# Patient Record
Sex: Male | Born: 1960 | Race: White | Hispanic: No | Marital: Single | Smoking: Never smoker
Health system: Southern US, Community
[De-identification: ages and names within clinical notes are randomized; demographics above are authoritative.]

## PROBLEM LIST (undated history)

## (undated) DIAGNOSIS — R6250 Unspecified lack of expected normal physiological development in childhood: Secondary | ICD-10-CM

## (undated) DIAGNOSIS — K219 Gastro-esophageal reflux disease without esophagitis: Secondary | ICD-10-CM

## (undated) DIAGNOSIS — K2270 Barrett's esophagus without dysplasia: Secondary | ICD-10-CM

## (undated) DIAGNOSIS — I10 Essential (primary) hypertension: Secondary | ICD-10-CM

## (undated) DIAGNOSIS — E785 Hyperlipidemia, unspecified: Secondary | ICD-10-CM

## (undated) DIAGNOSIS — F419 Anxiety disorder, unspecified: Secondary | ICD-10-CM

## (undated) DIAGNOSIS — N200 Calculus of kidney: Secondary | ICD-10-CM

## (undated) DIAGNOSIS — F99 Mental disorder, not otherwise specified: Secondary | ICD-10-CM

## (undated) DIAGNOSIS — N183 Chronic kidney disease, stage 3 unspecified: Secondary | ICD-10-CM

## (undated) DIAGNOSIS — E1165 Type 2 diabetes mellitus with hyperglycemia: Secondary | ICD-10-CM

## (undated) DIAGNOSIS — IMO0002 Reserved for concepts with insufficient information to code with codable children: Secondary | ICD-10-CM

## (undated) DIAGNOSIS — I50810 Right heart failure, unspecified: Secondary | ICD-10-CM

## (undated) DIAGNOSIS — K8020 Calculus of gallbladder without cholecystitis without obstruction: Secondary | ICD-10-CM

## (undated) DIAGNOSIS — D649 Anemia, unspecified: Secondary | ICD-10-CM

## (undated) DIAGNOSIS — I272 Pulmonary hypertension, unspecified: Secondary | ICD-10-CM

## (undated) DIAGNOSIS — J9611 Chronic respiratory failure with hypoxia: Secondary | ICD-10-CM

## (undated) HISTORY — DX: Chronic kidney disease, stage 3 unspecified: N18.30

## (undated) HISTORY — DX: Chronic kidney disease, stage 3 (moderate): N18.3

## (undated) HISTORY — PX: THROAT SURGERY: SHX803

---
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
## 1998-03-31 ENCOUNTER — Ambulatory Visit (HOSPITAL_COMMUNITY): Admission: RE | Admit: 1998-03-31 | Discharge: 1998-03-31 | Payer: Self-pay | Admitting: Gastroenterology

## 1999-11-16 ENCOUNTER — Ambulatory Visit (HOSPITAL_COMMUNITY): Admission: RE | Admit: 1999-11-16 | Discharge: 1999-11-16 | Payer: Self-pay | Admitting: Gastroenterology

## 1999-11-16 ENCOUNTER — Encounter (INDEPENDENT_AMBULATORY_CARE_PROVIDER_SITE_OTHER): Payer: Self-pay | Admitting: Specialist

## 2002-02-19 ENCOUNTER — Ambulatory Visit (HOSPITAL_COMMUNITY): Admission: RE | Admit: 2002-02-19 | Discharge: 2002-02-19 | Payer: Self-pay | Admitting: Gastroenterology

## 2002-02-19 ENCOUNTER — Encounter (INDEPENDENT_AMBULATORY_CARE_PROVIDER_SITE_OTHER): Payer: Self-pay | Admitting: *Deleted

## 2004-04-10 ENCOUNTER — Ambulatory Visit (HOSPITAL_COMMUNITY): Admission: RE | Admit: 2004-04-10 | Discharge: 2004-04-10 | Payer: Self-pay | Admitting: Gastroenterology

## 2004-04-10 ENCOUNTER — Encounter (INDEPENDENT_AMBULATORY_CARE_PROVIDER_SITE_OTHER): Payer: Self-pay | Admitting: *Deleted

## 2007-02-02 ENCOUNTER — Encounter: Admission: RE | Admit: 2007-02-02 | Discharge: 2007-02-02 | Payer: Self-pay | Admitting: Occupational Therapy

## 2010-09-18 NOTE — Procedures (Signed)
[place]  Patient:    Tony Jones, Tony Jones                         MRN: [id number] Proc. Date: 11/16/99 Adm. Date:  [id number] Attending:  Nelda Marseille CC:         Petra Kuba, M.D.             Stanley C. Andrey Campanile, M.D.                           Procedure Report  PROCEDURE PERFORMED:  Esophagogastroduodenoscopy with biopsies.  ENDOSCOPIST:  Petra Kuba, M.D.  INDICATIONS FOR PROCEDURE:  Patient with a history of Barretts.  Some recent occasional increased upper tract symptoms.  Consent was signed after risks, benefits, methods, and options were thoroughly discussed multiple times in the past with his parents.  MEDICATIONS USED:  Demerol 40 mg, Versed 4 mg.  DESCRIPTION OF PROCEDURE:  Video endoscope was inserted by direct vision.  At about 22 cm his Barretts started.  I believe this is higher than previous endoscopies.  The most proximal part of the esophagus was normal.  The scope passed through the esophagus which was obviously had changes with Barretts but no significant ulceration, mass lesion, etc.  He did have a moderate-sized hiatal hernia widely patent.  No obvious ring or stricture.  Scope passed down into the stomach.  Some bilious material was suctioned.  Advanced through an antrum pertinent for some moderate antritis through a normal pylorus into a normal duodenal bulb and around the C-loop to a normal second portion of the duodenum.  The scope was withdrawn back to the bulb which again confirmed it was normal.  Scope was withdrawn back to the stomach which was evaluated on retroflex and then straight visualization.  High in the cardia, the hiatal hernia was confirmed.  Other than the antritis, no other abnormality was seen. The scope was straightened and slowly withdrawn through the esophagus which confirmed the above findings.  Multiple esophageal biopsies were obtained from about 38 cm to 30, were put in one container and from  about 30 cm to the top of the Barretts at about 22 were put in the second container.  The scope was then advanced to the antrum and multiple antral biopsies were obtained and put in the third container.  Air was suctioned, scope slowly withdrawn.  The patient tolerated the procedure well.  There was no obvious immediate complication.  ENDOSCOPIC DIAGNOSIS: 1. Moderate hiatal hernia. 2. 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 3. Antritis moderate, status post biopsy. 4. Otherwise normal esophagogastroduodenoscopy.  PLAN:  Await pathology to determine future Barretts screening.  Continue medicines and follow-up p.r.n. and will review the above with the parents. DD:  11/16/99 TD:  11/16/99 Job: [id number]

## 2010-09-18 NOTE — Op Note (Signed)
NAME:  Tony Jones, Tony Jones                            ACCOUNT NO.:  [id number]   MEDICAL RECORD NO.:  [id number]                   PATIENT TYPE:  AMB   LOCATION:  ENDO                                 FACILITY:  [facility]   PHYSICIAN:  Petra Kuba, M.D.                 DATE OF BIRTH:  Sep 07, 1960   DATE OF PROCEDURE:  02/19/2002  DATE OF DISCHARGE:                                 OPERATIVE REPORT   PROCEDURE PERFORMED:  Esophagogastroduodenoscopy with biopsy   INDICATIONS FOR PROCEDURE:  Barrett's due for repeat screening.  Consent was  signed after risks, benefits, methods and options were thoroughly discussed  with the parents on multiple occasions.   MEDICINES USED:  Demerol 40, Versed 4.   DESCRIPTION OF PROCEDURE:  Scope was inserted by direct vision.  The most  proximal of his esophagus was normal; however, beginning at about 23 cm down  to the large hiatal hernia was obvious Barrett's.  Photo documentation and  biopsies at the end of the procedure were obtained.  The scope passed into  the stomach and advanced through the antrum.  Pertinent for some significant  antritis and two small nodules, through a normal pylorus into a normal  duodenal bulb and around the duodenum to a normal second portion of the  duodenum.  The scope was withdrawn back to the bulb and a good look there  confirmed the above findings.  Scope was withdrawn back to the stomach.  He  had some difficulty holding air.  Some insufflation was obtained and no  obvious lesions of the cardia or fundus, lesser or greater curve were seen.  The inflammation was confined to the antrum.  Multiple antral biopsies of  both the nodules and the inflammation were obtained.  The scope was then  slowly withdrawn.  The Barrett's biopsies were obtained.  No other  abnormality in the esophagus were noted.  The scope was removed.  The  patient tolerated the procedure well.  There was no obvious immediate  complication.   ENDOSCOPIC  DIAGNOSES:  1. Significant Barrett's 2 to 23 cm, status post multiple biopsies.  2. Large hiatal hernia.  3. Significant antritis and small nodules, biopsied.  4. Otherwise within normal limits.    PLAN:  Await pathology.  Although he is asymptomatic may need to increase  his proton pump inhibitors to b.i.d.  Happy to see back p.r.n., otherwise  return care to Dr. Andrey Campanile for the customary health care maintenance.                                                  Petra Kuba, M.D.    MEM/MEDQ  D:  02/19/2002  T:  02/19/2002  Job:  [id number]  cc:   Vale Haven. Andrey Campanile, M.D.  [address]  [place]  [place] [address]  Fax: [phone]

## 2010-09-18 NOTE — Op Note (Signed)
NAME:  Tony, Jones NO.:  [id number]   MEDICAL RECORD NO.:  [id number]          PATIENT TYPE:  AMB   LOCATION:  ENDO                         FACILITY:  [facility]   PHYSICIAN:  Petra Kuba, M.D.    DATE OF BIRTH:  25-Nov-1960   DATE OF PROCEDURE:  04/10/2004  DATE OF DISCHARGE:                                 OPERATIVE REPORT   PROCEDURE:  EGD with biopsy.   INDICATION:  Barrett's, due for repeat screening.  Consent was signed after  risks, benefits, methods, options thoroughly discussed multiple times in the  past with his parents.   MEDICINES USED:  Demerol 40, Versed 3.   DESCRIPTION OF PROCEDURE:  The video endoscope was inserted by direct  vision.  Obvious Barrett's was seen almost including his entire esophagus.  Only the most proximal few centimeters were not involved.  He did have a  moderately large hiatal hernia.  The scope passed into the stomach, advanced  to the antrum where some mild to moderate antritis was seen and two small  antral polyps which were biopsied, put in the first container.  Biopsies of  the antrum were obtained as well.  The scope passed through a normal  pylorus, into the duodenal bulb pertinent for some minimal bulbitis, and  around the C-loop to a normal second portion of the duodenum.  The scope was  withdrawn back to the stomach, and retroflexed.  High in the cardia, the  hiatal hernia was confirmed.  The fundus, angularis, lesser and greater  curve were normal.  Straight visualization of the stomach did not reveal any  additional findings.  The scope was then slowly withdrawn.  The lower  esophageal biopsies from about 40-30 cm were put in the second container.  We went ahead and put the biopsies from about 30 to about 22 cm in a third  container.  No other abnormalities were seen.  The scope was reinserted into  the stomach; air was suctioned, the scope slowly withdrawn.  The patient  tolerated the procedure well.  Other than  a brief apneic spell prior to  beginning the procedure after sedation was begun, he had no obvious  immediate complications.   ENDOSCOPIC DIAGNOSES:  1.  Moderate hiatal hernia.  2.  Obvious Barrett's to 22 cm status post biopsy.  3.  Antritis and two antral small polyps biopsied.  4.  Minimal bulbitis.  5.  Otherwise, normal EGD.   PLAN:  1.  Await pathology.  2.  Continue pump inhibitors.  Possibly try and use Zegerid.  3.  Follow up with me p.r.n.  Otherwise, of note, we did not use the spray      on him today, and he did quite well, and maybe we should hold off on      that for future endoscopies since that always seems to bother him      postprocedure.      MEM/MEDQ  D:  04/10/2004  T:  04/10/2004  Job:  [id number]   cc:   Vale Haven. Andrey Campanile, M.D.  [phone]  [address]  [place]  [place] [address]  Fax: [phone]

## 2011-12-03 ENCOUNTER — Ambulatory Visit (HOSPITAL_COMMUNITY)
Admission: RE | Admit: 2011-12-03 | Discharge: 2011-12-03 | Disposition: A | Discharge status: Home / Self Care | Payer: Medicare Other | Source: Ambulatory Visit | Attending: Gastroenterology | Admitting: Gastroenterology

## 2011-12-03 ENCOUNTER — Encounter (HOSPITAL_COMMUNITY): Payer: Self-pay | Admitting: Anesthesiology

## 2011-12-03 ENCOUNTER — Encounter (HOSPITAL_COMMUNITY)
Admission: RE | Disposition: A | Discharge status: Home / Self Care | Payer: Self-pay | Source: Ambulatory Visit | Attending: Gastroenterology

## 2011-12-03 ENCOUNTER — Ambulatory Visit (HOSPITAL_COMMUNITY): Payer: Medicare Other | Admitting: Anesthesiology

## 2011-12-03 ENCOUNTER — Encounter (HOSPITAL_COMMUNITY): Payer: Self-pay

## 2011-12-03 DIAGNOSIS — K2970 Gastritis, unspecified, without bleeding: Secondary | ICD-10-CM | POA: Insufficient documentation

## 2011-12-03 DIAGNOSIS — K219 Gastro-esophageal reflux disease without esophagitis: Secondary | ICD-10-CM | POA: Insufficient documentation

## 2011-12-03 DIAGNOSIS — K449 Diaphragmatic hernia without obstruction or gangrene: Secondary | ICD-10-CM | POA: Insufficient documentation

## 2011-12-03 DIAGNOSIS — K2960 Other gastritis without bleeding: Secondary | ICD-10-CM | POA: Insufficient documentation

## 2011-12-03 DIAGNOSIS — Z79899 Other long term (current) drug therapy: Secondary | ICD-10-CM | POA: Insufficient documentation

## 2011-12-03 DIAGNOSIS — K319 Disease of stomach and duodenum, unspecified: Secondary | ICD-10-CM | POA: Insufficient documentation

## 2011-12-03 DIAGNOSIS — D131 Benign neoplasm of stomach: Secondary | ICD-10-CM | POA: Insufficient documentation

## 2011-12-03 HISTORY — DX: Anxiety disorder, unspecified: F41.9

## 2011-12-03 HISTORY — DX: Barrett's esophagus without dysplasia: K22.70

## 2011-12-03 HISTORY — DX: Mental disorder, not otherwise specified: F99

## 2011-12-03 HISTORY — DX: Gastro-esophageal reflux disease without esophagitis: K21.9

## 2011-12-03 SURGERY — ESOPHAGOGASTRODUODENOSCOPY (EGD) WITH PROPOFOL
Anesthesia: Monitor Anesthesia Care

## 2011-12-03 MED ORDER — LACTATED RINGERS IV SOLN
INTRAVENOUS | Status: DC
  Administered 2011-12-03: 1000 mL via INTRAVENOUS

## 2011-12-03 MED ORDER — FENTANYL CITRATE 0.05 MG/ML IJ SOLN
INTRAMUSCULAR | Status: DC | PRN
  Administered 2011-12-03: 50 ug via INTRAVENOUS

## 2011-12-03 MED ORDER — PROPOFOL 10 MG/ML IV EMUL
INTRAVENOUS | Status: DC | PRN
  Administered 2011-12-03: 70 ug/kg/min via INTRAVENOUS

## 2011-12-03 SURGICAL SUPPLY — 15 items

## 2011-12-03 NOTE — Transfer of Care (Signed)
Immediate Anesthesia Transfer of Care Note  Patient: Tony Jones  Procedure(s) Performed: Procedure(s) (LRB): ESOPHAGOGASTRODUODENOSCOPY (EGD) WITH PROPOFOL (N/A)  Patient Location: PACU and Endoscopy Unit  Anesthesia Type: MAC  Level of Consciousness: awake, sedated and patient cooperative  Airway & Oxygen Therapy: Patient Spontanous Breathing, Patient connected to face mask oxygen and nasal airway in place.  Post-op Assessment: Report given to PACU RN and Post -op Vital signs reviewed and stable  Post vital signs: Reviewed and stable  Complications: No apparent anesthesia complications

## 2011-12-03 NOTE — Anesthesia Preprocedure Evaluation (Addendum)
Anesthesia Evaluation  Patient identified by MRN, date of birth, ID band Patient awake    Reviewed: Allergy & Precautions, H&P , NPO status , Patient's Chart, lab work & pertinent test results  Airway Mallampati: IV TM Distance: >3 FB Neck ROM: Full  Mouth opening: Limited Mouth Opening  Dental  (+) Poor Dentition   Pulmonary neg pulmonary ROS,  breath sounds clear to auscultation  Pulmonary exam normal       Cardiovascular negative cardio ROS  Rhythm:Regular Rate:Normal     Neuro/Psych PSYCHIATRIC DISORDERS Anxiety Developmentally challenged. He does not speak much per mother.negative neurological ROS     GI/Hepatic Neg liver ROS, GERD-  Medicated,  Endo/Other  Morbid obesity  Renal/GU negative Renal ROS  negative genitourinary   Musculoskeletal negative musculoskeletal ROS (+)   Abdominal (+) + obese,   Peds negative pediatric ROS (+) mental retardation Hematology negative hematology ROS (+)   Anesthesia Other Findings   Reproductive/Obstetrics negative OB ROS                         Anesthesia Physical Anesthesia Plan  ASA: II  Anesthesia Plan: MAC   Post-op Pain Management:    Induction: Intravenous  Airway Management Planned:   Additional Equipment:   Intra-op Plan:   Post-operative Plan: Extubation in OR  Informed Consent: I have reviewed the patients History and Physical, chart, labs and discussed the procedure including the risks, benefits and alternatives for the proposed anesthesia with the patient or authorized representative who has indicated his/her understanding and acceptance.   Dental advisory given  Plan Discussed with: CRNA  Anesthesia Plan Comments: (High risk for sleep apnea. Per patient's mother , he has had procedures at [facility] and has required a nasal airway. We will expect to place a nasal airway after some sedation. Discussed possible nosebleed  with mother.)       Anesthesia Quick Evaluation

## 2011-12-03 NOTE — Op Note (Signed)
[facility] [address] [place], [place]  [address]  ENDOSCOPY PROCEDURE REPORT  PATIENT:  Tony, Jones  MR#:  [id number] BIRTHDATE:  09-21-1960, 51 yrs. old  GENDER:  male  ENDOSCOPIST:  Vida Rigger, MD Referred by:  Benedetto Goad, MD  PROCEDURE DATE:  12/03/2011 PROCEDURE:  EGD with biopsy, 19147 ASA CLASS:  Class III INDICATIONS:  history of Barrett's status post ablation due for repeat screening  MEDICATIONS:  50 mg Diprivan 50 mcg fentanyl TOPICAL ANESTHETIC: 1 spray used  DESCRIPTION OF PROCEDURE:   After the risks benefits and alternatives of the procedure were thoroughly explained, informed consent was obtained.  The Pentax Gastroscope [id number] endoscope was introduced through the mouth and advanced to the second portion of the duodenum, limited by sedation problems with some respiratory depression which required [place] to temporarily withdraw the scope and bag the patient and place a nasal airway and once his oxygenation returned to normal we were able to resume the procedure which was done in the customary fashion and multiple biopsies were obtained of his esophagus as well as his stomach and the findings are documented below The instrument was slowly withdrawn as the mucosa was fully examined. The patient tolerated the procedure well there was no additional complication <<PROCEDUREIMAGES>>  FINDINGS: 1. Small hiatal hernia 2. No obvious Barrett's was some scarring from previous therapy status post multiple esophageal biopsies of all areas 3. Gastritis and antritis status post biopsy 4. Small gastric polyps and 2 antral nodules status post biopsy 5. Mild bulbitis 6. Otherwise within normal limits EGD  COMPLICATIONS:  Respiratory depression as above which required bagging and nasal airway  ENDOSCOPIC IMPRESSION:  Above  RECOMMENDATIONS: Await pathology and will discuss with his [facility] duct based on pathology when repeat procedure should be done and happy to  see back when necessary  REPEAT EXAM:  As above  ______________________________ Vida Rigger, MD  CC:  Benedetto Goad, MD  n. Rosalie DoctorVida Rigger at 12/03/2011 11:47 AM  Candie Echevaria, [phone]

## 2011-12-03 NOTE — Anesthesia Postprocedure Evaluation (Signed)
  Anesthesia Post-op Note  Patient: Tony Jones  Procedure(s) Performed: Procedure(s) (LRB): ESOPHAGOGASTRODUODENOSCOPY (EGD) WITH PROPOFOL (N/A)  Patient Location: PACU  Anesthesia Type: MAC  Level of Consciousness: awake and alert   Airway and Oxygen Therapy: Patient Spontanous Breathing  Post-op Pain: mild  Post-op Assessment: Post-op Vital signs reviewed, Patient's Cardiovascular Status Stable, Respiratory Function Stable, Patent Airway and No signs of Nausea or vomiting  Post-op Vital Signs: stable  Complications: No apparent anesthesia complications

## 2011-12-03 NOTE — Progress Notes (Signed)
Tony Jones 9:49 AM  Subjective: Patient without any specific GI complaints and his history was discussed with his parents and no current issues  Objective: Vital signs stable afebrile no acute distress exam proceed he presedation evaluation  Assessment: History of Barrett's esophagus due for repeat screening  Plan: Okay for sedation and endoscopy  Kariann Wecker E

## 2013-09-26 ENCOUNTER — Inpatient Hospital Stay (HOSPITAL_COMMUNITY)
Admission: EM | Admit: 2013-09-26 | Discharge: 2013-09-29 | DRG: 291 | Disposition: A | Discharge status: Home Health | Payer: Medicare Other | Attending: Internal Medicine | Admitting: Internal Medicine

## 2013-09-26 ENCOUNTER — Encounter (HOSPITAL_COMMUNITY): Payer: Self-pay | Admitting: Emergency Medicine

## 2013-09-26 ENCOUNTER — Emergency Department (HOSPITAL_COMMUNITY): Payer: Medicare Other

## 2013-09-26 DIAGNOSIS — J9621 Acute and chronic respiratory failure with hypoxia: Secondary | ICD-10-CM | POA: Diagnosis present

## 2013-09-26 DIAGNOSIS — I5031 Acute diastolic (congestive) heart failure: Principal | ICD-10-CM | POA: Diagnosis present

## 2013-09-26 DIAGNOSIS — F99 Mental disorder, not otherwise specified: Secondary | ICD-10-CM | POA: Diagnosis present

## 2013-09-26 DIAGNOSIS — Z87442 Personal history of urinary calculi: Secondary | ICD-10-CM

## 2013-09-26 DIAGNOSIS — E873 Alkalosis: Secondary | ICD-10-CM | POA: Diagnosis present

## 2013-09-26 DIAGNOSIS — Z66 Do not resuscitate: Secondary | ICD-10-CM | POA: Diagnosis present

## 2013-09-26 DIAGNOSIS — Z79899 Other long term (current) drug therapy: Secondary | ICD-10-CM

## 2013-09-26 DIAGNOSIS — I517 Cardiomegaly: Secondary | ICD-10-CM

## 2013-09-26 DIAGNOSIS — I509 Heart failure, unspecified: Secondary | ICD-10-CM | POA: Diagnosis present

## 2013-09-26 DIAGNOSIS — J9600 Acute respiratory failure, unspecified whether with hypoxia or hypercapnia: Secondary | ICD-10-CM | POA: Diagnosis present

## 2013-09-26 DIAGNOSIS — IMO0001 Reserved for inherently not codable concepts without codable children: Secondary | ICD-10-CM | POA: Diagnosis present

## 2013-09-26 DIAGNOSIS — E1165 Type 2 diabetes mellitus with hyperglycemia: Secondary | ICD-10-CM | POA: Diagnosis present

## 2013-09-26 DIAGNOSIS — J9601 Acute respiratory failure with hypoxia: Secondary | ICD-10-CM

## 2013-09-26 DIAGNOSIS — E785 Hyperlipidemia, unspecified: Secondary | ICD-10-CM | POA: Diagnosis present

## 2013-09-26 DIAGNOSIS — I10 Essential (primary) hypertension: Secondary | ICD-10-CM | POA: Diagnosis present

## 2013-09-26 DIAGNOSIS — IMO0002 Reserved for concepts with insufficient information to code with codable children: Secondary | ICD-10-CM | POA: Diagnosis present

## 2013-09-26 DIAGNOSIS — Z6841 Body Mass Index (BMI) 40.0 and over, adult: Secondary | ICD-10-CM

## 2013-09-26 DIAGNOSIS — F79 Unspecified intellectual disabilities: Secondary | ICD-10-CM | POA: Diagnosis present

## 2013-09-26 DIAGNOSIS — F411 Generalized anxiety disorder: Secondary | ICD-10-CM | POA: Diagnosis present

## 2013-09-26 DIAGNOSIS — K219 Gastro-esophageal reflux disease without esophagitis: Secondary | ICD-10-CM | POA: Diagnosis present

## 2013-09-26 DIAGNOSIS — E669 Obesity, unspecified: Secondary | ICD-10-CM | POA: Diagnosis present

## 2013-09-26 DIAGNOSIS — D696 Thrombocytopenia, unspecified: Secondary | ICD-10-CM | POA: Diagnosis present

## 2013-09-26 DIAGNOSIS — Z9089 Acquired absence of other organs: Secondary | ICD-10-CM

## 2013-09-26 HISTORY — DX: Reserved for concepts with insufficient information to code with codable children: IMO0002

## 2013-09-26 HISTORY — DX: Essential (primary) hypertension: I10

## 2013-09-26 HISTORY — DX: Hyperlipidemia, unspecified: E78.5

## 2013-09-26 HISTORY — DX: Type 2 diabetes mellitus with hyperglycemia: E11.65

## 2013-09-26 LAB — GLUCOSE, CAPILLARY
Glucose-Capillary: 182 mg/dL — ABNORMAL HIGH (ref 70–99)
Glucose-Capillary: 188 mg/dL — ABNORMAL HIGH (ref 70–99)

## 2013-09-26 LAB — CBG MONITORING, ED: Glucose-Capillary: 205 mg/dL — ABNORMAL HIGH (ref 70–99)

## 2013-09-26 LAB — URINALYSIS, ROUTINE W REFLEX MICROSCOPIC
Bilirubin Urine: NEGATIVE
Glucose, UA: NEGATIVE mg/dL
Hgb urine dipstick: NEGATIVE
Ketones, ur: NEGATIVE mg/dL
Leukocytes, UA: NEGATIVE
Nitrite: NEGATIVE
Protein, ur: NEGATIVE mg/dL
Specific Gravity, Urine: 1.009 (ref 1.005–1.030)
Urobilinogen, UA: 1 mg/dL (ref 0.0–1.0)
pH: 5.5 (ref 5.0–8.0)

## 2013-09-26 LAB — CBC WITH DIFFERENTIAL/PLATELET
Basophils Absolute: 0 10*3/uL (ref 0.0–0.1)
Basophils Relative: 0 % (ref 0–1)
Eosinophils Absolute: 0.2 10*3/uL (ref 0.0–0.7)
Eosinophils Relative: 2 % (ref 0–5)
HCT: 50.7 % (ref 39.0–52.0)
Hemoglobin: 15.8 g/dL (ref 13.0–17.0)
Lymphocytes Relative: 8 % — ABNORMAL LOW (ref 12–46)
Lymphs Abs: 0.8 10*3/uL (ref 0.7–4.0)
MCH: 27.7 pg (ref 26.0–34.0)
MCHC: 31.2 g/dL (ref 30.0–36.0)
MCV: 88.8 fL (ref 78.0–100.0)
Monocytes Absolute: 0.5 10*3/uL (ref 0.1–1.0)
Monocytes Relative: 5 % (ref 3–12)
Neutro Abs: 9 10*3/uL — ABNORMAL HIGH (ref 1.7–7.7)
Neutrophils Relative %: 85 % — ABNORMAL HIGH (ref 43–77)
Platelets: 124 10*3/uL — ABNORMAL LOW (ref 150–400)
RBC: 5.71 MIL/uL (ref 4.22–5.81)
RDW: 18.3 % — ABNORMAL HIGH (ref 11.5–15.5)
WBC: 10.6 10*3/uL — ABNORMAL HIGH (ref 4.0–10.5)

## 2013-09-26 LAB — BASIC METABOLIC PANEL
BUN: 25 mg/dL — ABNORMAL HIGH (ref 6–23)
CO2: 33 mEq/L — ABNORMAL HIGH (ref 19–32)
Calcium: 8.4 mg/dL (ref 8.4–10.5)
Chloride: 92 mEq/L — ABNORMAL LOW (ref 96–112)
Creatinine, Ser: 1.07 mg/dL (ref 0.50–1.35)
GFR calc Af Amer: >90 mL/min (ref >90)
GFR calc non Af Amer: 78 mL/min — ABNORMAL LOW (ref >90)
Glucose, Bld: 191 mg/dL — ABNORMAL HIGH (ref 70–99)
Potassium: 4.5 mEq/L (ref 3.7–5.3)
Sodium: 138 mEq/L (ref 137–147)

## 2013-09-26 LAB — PRO B NATRIURETIC PEPTIDE: Pro B Natriuretic peptide (BNP): 1777 pg/mL — ABNORMAL HIGH (ref 0–125)

## 2013-09-26 LAB — HEMOGLOBIN A1C
HEMOGLOBIN A1C: 8.4 % — AB (ref <5.7)
Mean Plasma Glucose: 194 mg/dL — ABNORMAL HIGH (ref <117)

## 2013-09-26 LAB — TSH: TSH: 2.68 u[IU]/mL (ref 0.350–4.500)

## 2013-09-26 LAB — TROPONIN I: Troponin I: <0.3 ng/mL (ref <0.30)

## 2013-09-26 MED ORDER — ALBUTEROL SULFATE (2.5 MG/3ML) 0.083% IN NEBU: 2.5000 mg | INHALATION_SOLUTION | RESPIRATORY_TRACT | Status: DC | PRN

## 2013-09-26 MED ORDER — ACETAMINOPHEN 500 MG PO TABS
500.0000 mg | ORAL_TABLET | Freq: Four times a day (QID) | ORAL | Status: DC | PRN
Start: 2013-09-26 — End: 2013-09-29
  Administered 2013-09-28: 500 mg via ORAL
  Filled 2013-09-26: qty 1

## 2013-09-26 MED ORDER — SERTRALINE HCL 50 MG PO TABS
50.0000 mg | ORAL_TABLET | Freq: Every day | ORAL | Status: DC
  Administered 2013-09-27 – 2013-09-29 (×3): 50 mg via ORAL
  Filled 2013-09-26 (×3): qty 1

## 2013-09-26 MED ORDER — PANTOPRAZOLE SODIUM 40 MG PO TBEC
40.0000 mg | DELAYED_RELEASE_TABLET | Freq: Two times a day (BID) | ORAL | Status: DC
  Administered 2013-09-26 – 2013-09-29 (×6): 40 mg via ORAL
  Filled 2013-09-26 (×7): qty 1

## 2013-09-26 MED ORDER — ATORVASTATIN CALCIUM 10 MG PO TABS
10.0000 mg | ORAL_TABLET | Freq: Every day | ORAL | Status: DC
  Administered 2013-09-27 – 2013-09-29 (×3): 10 mg via ORAL
  Filled 2013-09-26 (×3): qty 1

## 2013-09-26 MED ORDER — TRIAMCINOLONE ACETONIDE 0.1 % EX CREA: 1.0000 "application " | TOPICAL_CREAM | Freq: Every day | CUTANEOUS | Status: DC | PRN

## 2013-09-26 MED ORDER — ONDANSETRON HCL 4 MG PO TABS: 4.0000 mg | ORAL_TABLET | Freq: Four times a day (QID) | ORAL | Status: DC | PRN

## 2013-09-26 MED ORDER — INSULIN ASPART 100 UNIT/ML ~~LOC~~ SOLN
0.0000 [IU] | Freq: Three times a day (TID) | SUBCUTANEOUS | Status: DC
  Administered 2013-09-26 – 2013-09-27 (×3): 2 [IU] via SUBCUTANEOUS
  Administered 2013-09-27: 3 [IU] via SUBCUTANEOUS
  Administered 2013-09-28: 1 [IU] via SUBCUTANEOUS
  Administered 2013-09-28: 3 [IU] via SUBCUTANEOUS
  Administered 2013-09-28 – 2013-09-29 (×2): 1 [IU] via SUBCUTANEOUS
  Administered 2013-09-29: 3 [IU] via SUBCUTANEOUS

## 2013-09-26 MED ORDER — ATENOLOL 50 MG PO TABS
50.0000 mg | ORAL_TABLET | Freq: Every day | ORAL | Status: DC
  Administered 2013-09-27 – 2013-09-29 (×3): 50 mg via ORAL
  Filled 2013-09-26 (×3): qty 1

## 2013-09-26 MED ORDER — SODIUM CHLORIDE 0.9 % IJ SOLN
3.0000 mL | Freq: Two times a day (BID) | INTRAMUSCULAR | Status: DC
  Administered 2013-09-27 – 2013-09-28 (×4): 3 mL via INTRAVENOUS

## 2013-09-26 MED ORDER — ONDANSETRON HCL 4 MG/2ML IJ SOLN: 4.0000 mg | Freq: Four times a day (QID) | INTRAMUSCULAR | Status: DC | PRN

## 2013-09-26 MED ORDER — FUROSEMIDE 10 MG/ML IJ SOLN
40.0000 mg | Freq: Two times a day (BID) | INTRAMUSCULAR | Status: DC
  Administered 2013-09-27 (×2): 40 mg via INTRAVENOUS
  Filled 2013-09-26 (×5): qty 4

## 2013-09-26 MED ORDER — ENOXAPARIN SODIUM 40 MG/0.4ML ~~LOC~~ SOLN
40.0000 mg | SUBCUTANEOUS | Status: DC
  Administered 2013-09-26 – 2013-09-28 (×3): 40 mg via SUBCUTANEOUS
  Filled 2013-09-26 (×4): qty 0.4

## 2013-09-26 MED ORDER — FUROSEMIDE 10 MG/ML IJ SOLN
60.0000 mg | Freq: Once | INTRAMUSCULAR | Status: AC
  Administered 2013-09-26: 60 mg via INTRAVENOUS
  Filled 2013-09-26: qty 8

## 2013-09-26 MED ORDER — BIOTENE DRY MOUTH MT LIQD
15.0000 mL | Freq: Two times a day (BID) | OROMUCOSAL | Status: DC
  Administered 2013-09-26 – 2013-09-29 (×6): 15 mL via OROMUCOSAL

## 2013-09-26 MED ORDER — FESOTERODINE FUMARATE ER 8 MG PO TB24
8.0000 mg | ORAL_TABLET | Freq: Every day | ORAL | Status: DC
  Administered 2013-09-27 – 2013-09-29 (×3): 8 mg via ORAL
  Filled 2013-09-26 (×4): qty 1

## 2013-09-26 MED ORDER — FUROSEMIDE 10 MG/ML IJ SOLN: 40.0000 mg | Freq: Two times a day (BID) | INTRAMUSCULAR | Status: DC

## 2013-09-26 MED ORDER — POTASSIUM CHLORIDE CRYS ER 20 MEQ PO TBCR
40.0000 meq | EXTENDED_RELEASE_TABLET | Freq: Once | ORAL | Status: AC
  Administered 2013-09-26: 40 meq via ORAL
  Filled 2013-09-26: qty 2

## 2013-09-26 NOTE — Progress Notes (Signed)
Patient's sats on 3.5L of oxygen was 99%, I bumped patient down to 2L's and sats remained 95-96% for most of the afternoon. Before shift change I removed oxygen for 5 minutes and rechecked patient- 72% on room air. Replaced patient back on 2L and sats came up to 94%.  Will continue to monitor.

## 2013-09-26 NOTE — Progress Notes (Signed)
*  PRELIMINARY RESULTS* Echocardiogram 2D Echocardiogram has been performed.  Tony Jones 09/26/2013, 2:34 PM

## 2013-09-26 NOTE — H&P (Addendum)
History and Physical  Tony Jones OXB:[id number] DOB: Sep 06, 1960 DOA: 09/26/2013  Referring physician: EDP PCP: Woody Seller, MD  Outpatient Specialists:  1. Urology: Dr. Risa Grill  Chief Complaint: Worsening dyspnea.  HPI: Tony Jones is a 53 y.o. male with history of mental retardation, type II DM, HTN, dyslipidemia, GERD, kidney stones, presented to the ED on 09/26/13 with complaints of worsening dyspnea. Patient unable to provide history secondary to cognitive impairment. History obtained from parents at bedside. Patient apparently has been having issues with orthopnea for the last 4-5 months. He can lie down on the bed for a couple of hours in the daytime but is unable to sleep in bed at night and has been sleeping on a recliner. He occasionally snores but no reported apneic spells and does not carry OSA diagnosis. Parents have also noticed progressively worsening leg edema, edema of upper extremities, puffiness of face and abdominal distention. His symptoms got worse 4 days ago when he was seen by his PCP for incidental left plantar wart. He had worsening dyspnea with minimal exertion and at rest. He was started on Lasix 20 mg daily and his urine output improved however his dyspnea and edema did not change much. Today, they checked his oxygen saturation at home which apparently was 54% and improved to 90% after he was placed on his father's oxygen. They called PCPs office and was advised to come to the ED where patient was noted to be edematous, chest x-ray suggestive of pulmonary edema, proBNP elevated and patient was oxygenating at 80% on room air. He was provided with a dose of IV Lasix 60 mg with good diuresis and improvement patient states that his dyspnea is better. No reported chest pain, fever, cough or palpitations.  Review of Systems: All systems reviewed and apart from history of presenting illness, are negative.  Past Medical History  Diagnosis Date  . Mental disorder   .  Anxiety   . GERD (gastroesophageal reflux disease)   . Barrett esophagus   . Diabetes mellitus type 2, uncontrolled   . Hypertension   . Dyslipidemia    Past Surgical History  Procedure Laterality Date  . Appendectomy     Social History:  reports that he has never smoked. He has never used smokeless tobacco. He reports that he does not drink alcohol or use illicit drugs. Single. Lives with parents and is dependent on activities of daily living.  No Known Allergies  History reviewed. No pertinent family history. strong family history of cardiac disease on father's side. Dad has history of CABG. Also on home oxygen.  Prior to Admission medications   Medication Sig Start Date End Date Taking? Authorizing Provider  acetaminophen (TYLENOL) 500 MG tablet Take 500 mg by mouth every 6 (six) hours as needed for mild pain.   Yes Historical Provider, MD  atenolol (TENORMIN) 50 MG tablet Take 50 mg by mouth daily.   Yes Historical Provider, MD  atorvastatin (LIPITOR) 10 MG tablet Take 10 mg by mouth daily.   Yes Historical Provider, MD  fesoterodine (TOVIAZ) 8 MG TB24 tablet Take 8 mg by mouth daily.   Yes Historical Provider, MD  furosemide (LASIX) 20 MG tablet Take 20 mg by mouth daily.   Yes Historical Provider, MD  metFORMIN (GLUCOPHAGE) 500 MG tablet Take 500 mg by mouth 2 (two) times daily with a meal.   Yes Historical Provider, MD  pantoprazole (PROTONIX) 40 MG tablet Take 40 mg by mouth 2 (two) times  daily.   Yes Historical Provider, MD  sertraline (ZOLOFT) 50 MG tablet Take 50 mg by mouth daily.   Yes Historical Provider, MD  triamcinolone cream (KENALOG) 0.1 % Apply 1 application topically daily as needed (rash to back, face, neck, back of knees.).   Yes Historical Provider, MD   Physical Exam: Filed Vitals:   09/26/13 1245 09/26/13 1251 09/26/13 1254 09/26/13 1329  BP:    126/80  Pulse: 107 109  108  Temp:    98.1 F (36.7 C)  TempSrc:    Oral  Resp:   18 18  Height:    5\' 3"   (1.6 m)  Weight:    107.004 kg (235 lb 14.4 oz)  SpO2: 96% 97%  95%   temperature 98.65F, blood pressure 126/80 mmHg.   General exam: Moderately built and obese male patient, since sitting up comfortably on chair in the ED.  Head, eyes and ENT: Nontraumatic and normocephalic. Pupils equally reacting to light and accommodation. Oral mucosa moist.  Neck: Supple. No JVD, carotid bruit or thyromegaly. Thick neck.  Lymphatics: No lymphadenopathy.  Respiratory system: Reduced breath sounds in the bases with few basal crackles. No increased work of breathing.  Cardiovascular system: S1 and S2 heard, RRR. Difficulty appreciating JVD secondary to thick neck. No murmurs, gallops or clicks. 2+ pitting bilateral leg edema extending to sacrum posteriorly.  Gastrointestinal system: Abdomen is obese/nondistended, soft and nontender. Normal bowel sounds heard. No organomegaly or masses appreciated.  Central nervous system: Alert and oriented x2. No focal neurological deficits.  Extremities: Symmetric 5 x 5 power. Peripheral pulses symmetrically felt.   Skin: No rashes or acute findings.  Musculoskeletal system: Negative exam.  Psychiatry: Pleasant and cooperative.   Labs on Admission:  Basic Metabolic Panel:  Recent Labs Lab 09/26/13 0954  NA 138  K 4.5  CL 92*  CO2 33*  GLUCOSE 191*  BUN 25*  CREATININE 1.07  CALCIUM 8.4   Liver Function Tests: No results found for this basename: AST, ALT, ALKPHOS, BILITOT, PROT, ALBUMIN,  in the last 168 hours No results found for this basename: LIPASE, AMYLASE,  in the last 168 hours No results found for this basename: AMMONIA,  in the last 168 hours CBC:  Recent Labs Lab 09/26/13 0954  WBC 10.6*  NEUTROABS 9.0*  HGB 15.8  HCT 50.7  MCV 88.8  PLT 124*   Cardiac Enzymes:  Recent Labs Lab 09/26/13 0954  TROPONINI <0.30    BNP (last 3 results)  Recent Labs  09/26/13 0954  PROBNP 1777.0*   CBG:  Recent Labs Lab  09/26/13 0929  GLUCAP 205*    Radiological Exams on Admission: Dg Chest 2 View  09/26/2013   CLINICAL DATA:  Altered mental status.  Short of breath.  EXAM: CHEST  2 VIEW  COMPARISON:  09/27/2012.  FINDINGS: Cardiac silhouette is mild to moderately enlarged. Normal mediastinal contours. No convincing hilar mass.  Lungs show mild vascular congestion. There is mild interstitial thickening. Findings are less prominent than on the prior study. This still may reflect mild congestive heart failure. No focal lung consolidation is seen to suggest pneumonia. No pleural effusion or pneumothorax. The bony thorax is intact.  IMPRESSION: Mild congestive heart failure is suspected. No evidence of pneumonia.   Electronically Signed   By: Lajean Manes M.D.   On: 09/26/2013 10:28    EKG: Independently reviewed. Sinus tachycardia to 117 beats per minute without acute changes.  Assessment/Plan Principal Problem:   Acute CHF (  congestive heart failure) Active Problems:   Acute respiratory failure with hypoxia   Mental disorder   Diabetes mellitus type 2, uncontrolled   Hypertension   Dyslipidemia   Acute CHF   1. Acute CHF: Unknown type-no echo in system. Admit to telemetry. Continue IV Lasix. Patient already on beta blocker/atenolol. Strict intake output chart and daily weights. Check TSH. 2. Acute hypoxic respiratory failure: Secondary to decompensated CHF. May need to consider OSA as outpatient. Oxygen supplementation and treat CHF. 3. Uncontrolled type II DM: Hold metformin. Place on SSI. Check hemoglobin A1c. 4. Hypertension: Controlled. Continue atenolol. 5. Thrombocytopenia: Unclear if chronic or not. Follow CBCs. No reported bleeding. 6. History of mental retardation: Mental status at baseline. 7. History of dyslipidemia:     Code Status: DO NOT RESUSCITATE-confirmed with parents at bedside.  Family Communication: Discussed with parents at bedside.  Disposition Plan: Admit to telemetry. Will  likely need 2-3 days inpatient stay. Discharge home when medically stable.   Time spent: 65 minutes.  Modena Jansky, MD, FACP, [facility]. [facility] Pager [phone]  If 7PM-7AM, please contact night-coverage [email or web address] Password [email or web address] 09/26/2013, 1:39 PM

## 2013-09-26 NOTE — Progress Notes (Signed)
UR completed 

## 2013-09-26 NOTE — ED Notes (Signed)
Admitting MD at bedside.

## 2013-09-26 NOTE — ED Notes (Addendum)
Pt was having AMS, HR 114, O2 was 54% prior to O2 therapy.  Family put O2 on pt u to 6L via [place] for 15 mins to get O2 up to 90% before they transported pt here to ED.  Pt has plantar wart on foot that pt is c/o of.  Family also states that pt has been pale over the past couple of days and also retaining more fluid than normal.

## 2013-09-26 NOTE — ED Provider Notes (Signed)
CSN: [id number]     Arrival date & time 09/26/13  1961 History   First MD Initiated Contact with Patient 09/26/13 [phone]     Chief Complaint  Patient presents with  . low O2 sats     (Consider location/radiation/quality/duration/timing/severity/associated sxs/prior Treatment) HPI  53yM brought in by brother and mother for evaluation of hypoxemia and swelling. Pt with hx of MR and hx from brother/mother. Worsening peripheral edema over past week. Just started on lasix for this. Last night coughing a lot. This morning appeared pale and very tired. Brother placed pulse oximeter on him and read in 50s. Father is on oxygen and he placed the pt on 6L with improvement of o2 sats to 90s. Pt color appeared better and he seemed more awake. Pt is complaining of L foot pain. Pretty much all lines of questioning return to him complaining of this. None smoker. No hx of any cardiac problems that family is aware of.   Past Medical History  Diagnosis Date  . Mental disorder   . Anxiety   . GERD (gastroesophageal reflux disease)   . Barrett esophagus    Past Surgical History  Procedure Laterality Date  . Appendectomy     No family history on file. History  Substance Use Topics  . Smoking status: Never Smoker   . Smokeless tobacco: Not on file  . Alcohol Use:     Review of Systems  All systems reviewed and negative, other than as noted in HPI.   Allergies  Review of patient's allergies indicates no known allergies.  Home Medications   Prior to Admission medications   Medication Sig Start Date End Date Taking? Authorizing Provider  fexofenadine (ALLEGRA) 180 MG tablet Take 180 mg by mouth daily.    Historical Provider, MD  lansoprazole (PREVACID) 30 MG capsule Take 30 mg by mouth daily.    Historical Provider, MD  LORazepam (ATIVAN) 0.5 MG tablet Take 0.5 mg by mouth every 8 (eight) hours.    Historical Provider, MD  sertraline (ZOLOFT) 50 MG tablet Take 50 mg by mouth daily.    Historical  Provider, MD   BP 131/81  Pulse 113  Resp 22  SpO2 99% Physical Exam  Nursing note and vitals reviewed. Constitutional: He appears well-developed and well-nourished. No distress.  HENT:  Head: Normocephalic and atraumatic.  Eyes: Conjunctivae are normal. Right eye exhibits no discharge. Left eye exhibits no discharge.  Neck: Neck supple.  Cardiovascular: Normal rate, regular rhythm and normal heart sounds.  Exam reveals no gallop and no friction rub.   No murmur heard. Pulmonary/Chest:  Mild tachypnea. Crackles bases.  Abdominal: Soft. He exhibits no distension. There is no tenderness.  Musculoskeletal: He exhibits edema. He exhibits no tenderness.  Neurological: He is alert.  Skin: Skin is warm and dry.  Psychiatric: He has a normal mood and affect. His behavior is normal. Thought content normal.    ED Course  Procedures (including critical care time) Labs Review Labs Reviewed  PRO B NATRIURETIC PEPTIDE - Abnormal; Notable for the following:    Pro B Natriuretic peptide (BNP) 1777.0 (*)    All other components within normal limits  CBC WITH DIFFERENTIAL - Abnormal; Notable for the following:    WBC 10.6 (*)    RDW 18.3 (*)    Platelets 124 (*)    Neutrophils Relative % 85 (*)    Neutro Abs 9.0 (*)    Lymphocytes Relative 8 (*)    All other components within normal  limits  BASIC METABOLIC PANEL - Abnormal; Notable for the following:    Chloride 92 (*)    CO2 33 (*)    Glucose, Bld 191 (*)    BUN 25 (*)    GFR calc non Af Amer 78 (*)    All other components within normal limits  HEMOGLOBIN A1C - Abnormal; Notable for the following:    Hemoglobin A1C 8.4 (*)    Mean Plasma Glucose 194 (*)    All other components within normal limits  BASIC METABOLIC PANEL - Abnormal; Notable for the following:    Chloride 94 (*)    CO2 39 (*)    Glucose, Bld 145 (*)    GFR calc non Af Amer 72 (*)    GFR calc Af Amer 84 (*)    All other components within normal limits  GLUCOSE,  CAPILLARY - Abnormal; Notable for the following:    Glucose-Capillary 182 (*)    All other components within normal limits  GLUCOSE, CAPILLARY - Abnormal; Notable for the following:    Glucose-Capillary 188 (*)    All other components within normal limits  GLUCOSE, CAPILLARY - Abnormal; Notable for the following:    Glucose-Capillary 151 (*)    All other components within normal limits  GLUCOSE, CAPILLARY - Abnormal; Notable for the following:    Glucose-Capillary 202 (*)    All other components within normal limits  GLUCOSE, CAPILLARY - Abnormal; Notable for the following:    Glucose-Capillary 153 (*)    All other components within normal limits  BASIC METABOLIC PANEL - Abnormal; Notable for the following:    Chloride 90 (*)    CO2 41 (*)    Glucose, Bld 137 (*)    GFR calc non Af Amer 68 (*)    GFR calc Af Amer 79 (*)    All other components within normal limits  CBC - Abnormal; Notable for the following:    RBC 5.93 (*)    HCT 52.4 (*)    RDW 17.6 (*)    Platelets 114 (*)    All other components within normal limits  PRO B NATRIURETIC PEPTIDE - Abnormal; Notable for the following:    Pro B Natriuretic peptide (BNP) 184.5 (*)    All other components within normal limits  GLUCOSE, CAPILLARY - Abnormal; Notable for the following:    Glucose-Capillary 212 (*)    All other components within normal limits  GLUCOSE, CAPILLARY - Abnormal; Notable for the following:    Glucose-Capillary 130 (*)    All other components within normal limits  GLUCOSE, CAPILLARY - Abnormal; Notable for the following:    Glucose-Capillary 222 (*)    All other components within normal limits  GLUCOSE, CAPILLARY - Abnormal; Notable for the following:    Glucose-Capillary 132 (*)    All other components within normal limits  CBC - Abnormal; Notable for the following:    WBC 10.6 (*)    RBC 5.82 (*)    RDW 17.4 (*)    Platelets 117 (*)    All other components within normal limits  BASIC METABOLIC  PANEL - Abnormal; Notable for the following:    Sodium 133 (*)    Chloride 87 (*)    CO2 37 (*)    Glucose, Bld 164 (*)    GFR calc non Af Amer 63 (*)    GFR calc Af Amer 74 (*)    All other components within normal limits  GLUCOSE, CAPILLARY - Abnormal;  Notable for the following:    Glucose-Capillary 146 (*)    All other components within normal limits  CBG MONITORING, ED - Abnormal; Notable for the following:    Glucose-Capillary 205 (*)    All other components within normal limits  TROPONIN I  URINALYSIS, ROUTINE W REFLEX MICROSCOPIC  TSH    Imaging Review No results found.  Dg Chest 2 View  09/26/2013   CLINICAL DATA:  Altered mental status.  Short of breath.  EXAM: CHEST  2 VIEW  COMPARISON:  09/27/2012.  FINDINGS: Cardiac silhouette is mild to moderately enlarged. Normal mediastinal contours. No convincing hilar mass.  Lungs show mild vascular congestion. There is mild interstitial thickening. Findings are less prominent than on the prior study. This still may reflect mild congestive heart failure. No focal lung consolidation is seen to suggest pneumonia. No pleural effusion or pneumothorax. The bony thorax is intact.  IMPRESSION: Mild congestive heart failure is suspected. No evidence of pneumonia.   Electronically Signed   By: Lajean Manes M.D.   On: 09/26/2013 10:28    EKG Interpretation   Date/Time:  Wednesday Sep 26 2013 09:28:40 EDT Ventricular Rate:  117 PR Interval:  137 QRS Duration: 86 QT Interval:  329 QTC Calculation: 459 R Axis:   88 Text Interpretation:  Sinusl tachycardia Non-specific ST-t changes  Artifact No old tracing to compare Confirmed by [place]  MD, [place] ([id number])  on 09/26/2013 11:38:08 AM      MDM   Final diagnoses:  Acute congestive heart failure    53yM with dyspnea/hypoxemia. Clinical picture consistent with HF. Started diuresing in ED. Admit.     Virgel Manifold, MD 09/29/13 [phone]

## 2013-09-26 NOTE — ED Notes (Signed)
Patient transported to X-ray 

## 2013-09-27 DIAGNOSIS — I5031 Acute diastolic (congestive) heart failure: Principal | ICD-10-CM

## 2013-09-27 LAB — BASIC METABOLIC PANEL
BUN: 21 mg/dL (ref 6–23)
CO2: 39 meq/L — AB (ref 19–32)
CREATININE: 1.14 mg/dL (ref 0.50–1.35)
Calcium: 8.7 mg/dL (ref 8.4–10.5)
Chloride: 94 mEq/L — ABNORMAL LOW (ref 96–112)
GFR calc Af Amer: 84 mL/min — ABNORMAL LOW (ref >90)
GFR, EST NON AFRICAN AMERICAN: 72 mL/min — AB (ref >90)
Glucose, Bld: 145 mg/dL — ABNORMAL HIGH (ref 70–99)
Potassium: 4.5 mEq/L (ref 3.7–5.3)
Sodium: 141 mEq/L (ref 137–147)

## 2013-09-27 LAB — GLUCOSE, CAPILLARY
GLUCOSE-CAPILLARY: 151 mg/dL — AB (ref 70–99)
Glucose-Capillary: 202 mg/dL — ABNORMAL HIGH (ref 70–99)
Glucose-Capillary: 153 mg/dL — ABNORMAL HIGH (ref 70–99)

## 2013-09-27 NOTE — Evaluation (Signed)
Physical Therapy Evaluation Patient Details Name: Tony Jones MRN: [id number] DOB: 04/18/1961 Today's Date: 09/27/2013   History of Present Illness  53 yo male admitted with CHF, hypoxia, dyspnea. Hx of mental disorder, DM. Pt lives with family.   Clinical Impression  On eval, pt required Min assist for mobility-able to ambulate ~500 feet. O2 sats 85% on 2L during ambulation. Encouraged increased ambulation to pt/pt's mother (with nursing supervision)    Follow Up Recommendations No PT follow up;Supervision/Assistance - 24 hour    Equipment Recommendations  None recommended by PT    Recommendations for Other Services       Precautions / Restrictions Precautions Precautions: Fall Restrictions Weight Bearing Restrictions: No      Mobility  Bed Mobility Overal bed mobility: Needs Assistance Bed Mobility: Supine to Sit     Supine to sit: Min assist;HOB elevated     General bed mobility comments: Assist for trunk  Transfers Overall transfer level: Needs assistance   Transfers: Sit to/from Stand Sit to Stand: Min guard         General transfer comment: close guard for safety.   Ambulation/Gait Ambulation/Gait assistance: Min assist Ambulation Distance (Feet): 500 Feet Assistive device: None Gait Pattern/deviations: Decreased stride length;Wide base of support     General Gait Details: good gait speed. slightly unsteady at times but no LOB. O2 sats 85% on 2L during ambulation  Stairs            Wheelchair Mobility    Modified Rankin (Stroke Patients Only)       Balance                                             Pertinent Vitals/Pain 88% RA at rest 85% 2L during ambulation    Home Living Family/patient expects to be discharged to:: Private residence Living Arrangements: Parent Available Help at Discharge: Family Type of Home: House Home Access: Level entry     Home Layout: One level Home Equipment: None      Prior  Function Level of Independence: Independent               Hand Dominance        Extremity/Trunk Assessment   Upper Extremity Assessment: Generalized weakness           Lower Extremity Assessment: Generalized weakness      Cervical / Trunk Assessment: Normal  Communication   Communication: No difficulties  Cognition Arousal/Alertness: Awake/alert Behavior During Therapy: WFL for tasks assessed/performed Overall Cognitive Status: History of cognitive impairments - at baseline                      General Comments      Exercises        Assessment/Plan    PT Assessment Patient needs continued PT services  PT Diagnosis Difficulty walking;Generalized weakness   PT Problem List Decreased strength;Decreased mobility;Decreased activity tolerance  PT Treatment Interventions Gait training;Functional mobility training;Therapeutic activities;Therapeutic exercise;Patient/family education;Balance training   PT Goals (Current goals can be found in the Care Plan section) Acute Rehab PT Goals Patient Stated Goal: get stronger PT Goal Formulation: With patient/family Time For Goal Achievement: 10/11/13 Potential to Achieve Goals: Good    Frequency Min 3X/week   Barriers to discharge        Co-evaluation  End of Session Equipment Utilized During Treatment: Gait belt Activity Tolerance: Patient tolerated treatment well Patient left: in chair;with call bell/phone within reach;with family/visitor present Nurse Communication:  (pt in chair)         Time: 1423-1440 PT Time Calculation (min): 17 min   Charges:   PT Evaluation $Initial PT Evaluation Tier I: 1 Procedure PT Treatments $Gait Training: 8-22 mins   PT G Codes:          Weston Anna, MPT Pager: [phone]

## 2013-09-27 NOTE — Progress Notes (Signed)
PROGRESS NOTE    Tony FITZHENRY Jones:[id number] DOB: [birth year] DOA: 09/26/2013 PCP: Woody Seller, MD  HPI/Brief narrative 53 y.o. male with history of mental retardation, type II DM, HTN, dyslipidemia, GERD, kidney stones, presented to the ED on 09/26/13 with complaints of worsening dyspnea. He was found to be hypoxic and had features of decompensated CHF.   Assessment/Plan:  1. Acute diastolic CHF: Patient was admitted to telemetry. 2-D echo shows normal EF and grade 1 diastolic dysfunction. Started on IV Lasix with good diuresis and symptomatic improvement. Continue IV Lasix for additional 24-48 hours and then switch to by mouth Lasix at higher than PTA dose. Continue atenolol. TSH normal. Improving. 2. Acute hypoxic respiratory failure: Secondary to decompensated CHF. Treat CHF and reassess home O2 requirement prior to discharge. 3. Uncontrolled type II DM: Hemoglobin A1c 8.4 suggesting poor outpatient control. Continue SSI. May need additional oral hypoglycemic agent or increased dose of metformin at discharge. 4. Hypertension: Controlled. Continue atenolol. 5. Thrombocytopenia: Follow CBC in a.m. 6. History of mental retardation: Mental status at baseline. 7. History of dyslipidemia   Code Status: Full Family Communication: Discussed with mother at bedside Disposition Plan: Home possibly in 72 hours   Consultants:  None  Procedures:  None  Antibiotics:  None   Subjective: Feels better. Dyspnea better. As per mother, patient slept in bed from 9 PM-2 AM and then stayed in chair. Decreasing swelling.  Objective: Filed Vitals:   09/26/13 2115 09/27/13 0501 09/27/13 0506 09/27/13 1542  BP: 121/76 122/82  118/77  Pulse: 95 101  92  Temp: 98.5 F (36.9 C) 97.5 F (36.4 C)  97.4 F (36.3 C)  TempSrc: Oral Oral  Oral  Resp: 22 20  20   Height:      Weight:   106.142 kg (234 lb)   SpO2: 93% 95%  94%    Intake/Output Summary (Last 24 hours) at 09/27/13 1736 Last  data filed at 09/27/13 1543  Gross per 24 hour  Intake    923 ml  Output   2970 ml  Net  -2047 ml   Filed Weights   09/26/13 1329 09/27/13 0506  Weight: 107.004 kg (235 lb 14.4 oz) 106.142 kg (234 lb)     Exam:  General exam: Pleasant middle-aged male sitting up on chair this morning in no obvious distress. Respiratory system: Slightly diminished breath sounds in the bases with a few basal crackles. Rest of lung fields clear to auscultation. No increased work of breathing. Cardiovascular system: S1 & S2 heard, RRR. No JVD, murmurs, gallops, clicks. 2+ pitting leg edema. Telemetry: Sinus rhythm in the 90s. Gastrointestinal system: Abdomen is nondistended, soft and nontender. Normal bowel sounds heard. Central nervous system: Alert and oriented x2. No focal neurological deficits. Extremities: Symmetric 5 x 5 power.   Data Reviewed: Basic Metabolic Panel:  Recent Labs Lab 09/26/13 0954 09/27/13 0655  NA 138 141  K 4.5 4.5  CL 92* 94*  CO2 33* 39*  GLUCOSE 191* 145*  BUN 25* 21  CREATININE 1.07 1.14  CALCIUM 8.4 8.7   Liver Function Tests: No results found for this basename: AST, ALT, ALKPHOS, BILITOT, PROT, ALBUMIN,  in the last 168 hours No results found for this basename: LIPASE, AMYLASE,  in the last 168 hours No results found for this basename: AMMONIA,  in the last 168 hours CBC:  Recent Labs Lab 09/26/13 0954  WBC 10.6*  NEUTROABS 9.0*  HGB 15.8  HCT 50.7  MCV 88.8  PLT  124*   Cardiac Enzymes:  Recent Labs Lab 09/26/13 0954  TROPONINI <0.30   BNP (last 3 results)  Recent Labs  09/26/13 0954  PROBNP 1777.0*   CBG:  Recent Labs Lab 09/26/13 1806 09/26/13 2111 09/27/13 0724 09/27/13 1131 09/27/13 1727  GLUCAP 182* 188* 151* 202* 153*    No results found for this or any previous visit (from the past 240 hour(s)).    Additional labs: 1. 2-D echo 09/26/13: Study Conclusions  - Left ventricle: The cavity size was normal. There was  moderate concentric hypertrophy. Systolic function was normal. The estimated ejection fraction was in the range of 55% to 60%. Wall motion was normal; there were no regional wall motion abnormalities. Doppler parameters are consistent with abnormal left ventricular relaxation (grade 1 diastolic dysfunction). Doppler parameters are consistent with elevated ventricular end-diastolic filling pressure. - Aortic valve: Trileaflet; mildly thickened, mildly calcified leaflets. Sclerosis without stenosis. There was no regurgitation. - Aortic root: The aortic root was normal in size. - Left atrium: The atrium was normal in size. - Right ventricle: The cavity size was normal. Wall thickness was normal. Systolic function was normal. - Right atrium: The atrium was normal in size. - Tricuspid valve: There was no regurgitation. - Pulmonary arteries: Systolic pressure was within the normal range. - Systemic veins: Not visualized.  Impressions:  - Normal biventricular size and function. Impaired relaxation with elevated filling pressures.      Studies: Dg Chest 2 View  09/26/2013   CLINICAL DATA:  Altered mental status.  Short of breath.  EXAM: CHEST  2 VIEW  COMPARISON:  09/27/2012.  FINDINGS: Cardiac silhouette is mild to moderately enlarged. Normal mediastinal contours. No convincing hilar mass.  Lungs show mild vascular congestion. There is mild interstitial thickening. Findings are less prominent than on the prior study. This still may reflect mild congestive heart failure. No focal lung consolidation is seen to suggest pneumonia. No pleural effusion or pneumothorax. The bony thorax is intact.  IMPRESSION: Mild congestive heart failure is suspected. No evidence of pneumonia.   Electronically Signed   By: Lajean Manes M.D.   On: 09/26/2013 10:28        Scheduled Meds: . antiseptic oral rinse  15 mL Mouth Rinse BID  . atenolol  50 mg Oral Daily  . atorvastatin  10 mg Oral Daily  .  enoxaparin (LOVENOX) injection  40 mg Subcutaneous Q24H  . fesoterodine  8 mg Oral Daily  . furosemide  40 mg Intravenous BID  . insulin aspart  0-9 Units Subcutaneous TID WC  . pantoprazole  40 mg Oral BID  . sertraline  50 mg Oral Daily  . sodium chloride  3 mL Intravenous Q12H   Continuous Infusions:   Principal Problem:   Acute CHF (congestive heart failure) Active Problems:   Acute respiratory failure with hypoxia   Mental disorder   Diabetes mellitus type 2, uncontrolled   Hypertension   Dyslipidemia   Acute CHF    Time spent: 30 minutes    Modena Jansky, MD, FACP, [facility]. [facility] Pager [phone]  If 7PM-7AM, please contact night-coverage [email or web address] Password [facility] 09/27/2013, 5:36 PM    LOS: 1 day

## 2013-09-28 LAB — CBC
HEMATOCRIT: 52.4 % — AB (ref 39.0–52.0)
Hemoglobin: 16.3 g/dL (ref 13.0–17.0)
MCH: 27.5 pg (ref 26.0–34.0)
MCHC: 31.1 g/dL (ref 30.0–36.0)
MCV: 88.4 fL (ref 78.0–100.0)
PLATELETS: 114 10*3/uL — AB (ref 150–400)
RBC: 5.93 MIL/uL — ABNORMAL HIGH (ref 4.22–5.81)
RDW: 17.6 % — ABNORMAL HIGH (ref 11.5–15.5)
WBC: 9.7 10*3/uL (ref 4.0–10.5)

## 2013-09-28 LAB — BASIC METABOLIC PANEL
BUN: 20 mg/dL (ref 6–23)
CHLORIDE: 90 meq/L — AB (ref 96–112)
CO2: 41 mEq/L (ref 19–32)
Calcium: 8.8 mg/dL (ref 8.4–10.5)
Creatinine, Ser: 1.2 mg/dL (ref 0.50–1.35)
GFR calc Af Amer: 79 mL/min — ABNORMAL LOW (ref >90)
GFR calc non Af Amer: 68 mL/min — ABNORMAL LOW (ref >90)
Glucose, Bld: 137 mg/dL — ABNORMAL HIGH (ref 70–99)
Potassium: 4.2 mEq/L (ref 3.7–5.3)
Sodium: 140 mEq/L (ref 137–147)

## 2013-09-28 LAB — GLUCOSE, CAPILLARY
GLUCOSE-CAPILLARY: 130 mg/dL — AB (ref 70–99)
Glucose-Capillary: 212 mg/dL — ABNORMAL HIGH (ref 70–99)
Glucose-Capillary: 222 mg/dL — ABNORMAL HIGH (ref 70–99)
Glucose-Capillary: 132 mg/dL — ABNORMAL HIGH (ref 70–99)

## 2013-09-28 LAB — PRO B NATRIURETIC PEPTIDE: Pro B Natriuretic peptide (BNP): 184.5 pg/mL — ABNORMAL HIGH (ref 0–125)

## 2013-09-28 MED ORDER — FUROSEMIDE 40 MG PO TABS
40.0000 mg | ORAL_TABLET | Freq: Every day | ORAL | Status: DC
  Administered 2013-09-28 – 2013-09-29 (×2): 40 mg via ORAL
  Filled 2013-09-28 (×2): qty 1

## 2013-09-28 MED ORDER — VITAMINS A & D EX OINT
TOPICAL_OINTMENT | CUTANEOUS | Status: AC
  Filled 2013-09-28: qty 5

## 2013-09-28 NOTE — Progress Notes (Signed)
Uneventful night for patient. Pt's brother stayed with patient. Pt still anxious to leave, but family reminds him that he will likely leave Saturday. Pt appears pleased to know a date to leave, but would still rather leave earlier. Pt slept part of the night in the chair in order to help with some knee pain. Pt no longer reporting pain after receiving tylenol.  Verlon Au, RN, BSN 6:39 AM 09/28/2013

## 2013-09-28 NOTE — Progress Notes (Signed)
Physical Therapy Treatment Patient Details Name: Tony Jones MRN: [id number] DOB: [birth year] Today's Date: 09/28/2013    History of Present Illness 53 yo male admitted with CHF, acute respiratory failure, dyspnea. Hx of mental disorder, DM. Pt lives with family.     PT Comments    Pt ambulated in hallway and oxygen saturations decreased so required 2L O2 (see below).  Pt likely to d/c home today and family had no questions/concerns regarding mobility at home.  Follow Up Recommendations  No PT follow up;Supervision/Assistance - 24 hour     Equipment Recommendations  None recommended by PT    Recommendations for Other Services       Precautions / Restrictions Precautions Precautions: Fall    Mobility  Bed Mobility Overal bed mobility: Needs Assistance Bed Mobility: Supine to Sit     Supine to sit: Min assist;HOB elevated     General bed mobility comments: Assist for trunk  Transfers Overall transfer level: Needs assistance Equipment used: None Transfers: Sit to/from Stand Sit to Stand: Min guard         General transfer comment: close guard for safety. family reports unsteadiness  Ambulation/Gait Ambulation/Gait assistance: Min guard Ambulation Distance (Feet): 450 Feet Assistive device: None Gait Pattern/deviations: Step-through pattern;Decreased stride length;Wide base of support     General Gait Details: slightly unsteady at times but no LOB; SpO2 down to 79% on room air so reapplied 2L O2 [name] and SpO2 increased to 92%   Stairs            Wheelchair Mobility    Modified Rankin (Stroke Patients Only)       Balance                                    Cognition Arousal/Alertness: Awake/alert Behavior During Therapy: WFL for tasks assessed/performed Overall Cognitive Status: History of cognitive impairments - at baseline                      Exercises      General Comments        Pertinent Vitals/Pain  SATURATION QUALIFICATIONS: (This note is used to comply with regulatory documentation for home oxygen)  Patient Saturations on Room Air at Rest = 92%  Patient Saturations on Room Air while Ambulating = 79%  Patient Saturations on 2 Liters of oxygen while Ambulating = 92%  Please briefly explain why patient needs home oxygen: to maintain oxygen saturations >90% during mobility    Home Living                      Prior Function            PT Goals (current goals can now be found in the care plan section) Progress towards PT goals: Progressing toward goals    Frequency  Min 3X/week    PT Plan Current plan remains appropriate    Co-evaluation             End of Session Equipment Utilized During Treatment: Gait belt;Oxygen Activity Tolerance: Patient tolerated treatment well Patient left: in chair;with call bell/phone within reach;with family/visitor present     Time: 8101-7510 PT Time Calculation (min): 16 min  Charges:  $Gait Training: 8-22 mins                    G Codes:  Tony Jones 09/28/2013, 10:29 AM Tony Jones, PT, DPT 09/28/2013 Pager: [phone]

## 2013-09-28 NOTE — Progress Notes (Signed)
PROGRESS NOTE    Tony Jones KYH:[id number] DOB: [birth year] DOA: 09/26/2013 PCP: Woody Seller, MD  HPI/Brief narrative 53 y.o. male with history of mental retardation, type II DM, HTN, dyslipidemia, GERD, kidney stones, presented to the ED on 09/26/13 with complaints of worsening dyspnea. He was found to be hypoxic and had features of decompensated CHF.   Assessment/Plan:  1. Acute diastolic CHF: Patient was admitted to telemetry. 2-D echo shows normal EF and grade 1 diastolic dysfunction. Started on IV Lasix with good diuresis and symptomatic improvement. Continue atenolol. TSH normal. Improving. Switched to PO Lasix on 5/29. Contraction alkalosis on labs- follow. 2. Acute hypoxic respiratory failure: Secondary to decompensated CHF. Treat CHF and reassess home O2 requirement prior to discharge. Will likely need home O2. 3. Uncontrolled type II DM: Hemoglobin A1c 8.4 suggesting poor outpatient control. Continue SSI. May need additional oral hypoglycemic agent or increased dose of metformin at discharge. 4. Hypertension: Controlled. Continue atenolol. 5. Thrombocytopenia: Stable. ? Etiology. 6. History of mental retardation: Mental status at baseline. 7. History of dyslipidemia   Code Status: Full Family Communication: Discussed with mother at bedside Disposition Plan: Home possibly 5/30   Consultants:  None  Procedures:  None  Antibiotics:  None   Subjective: Patient denies SOB. As per mom, he has significantly improved compared to admission.  Objective: Filed Vitals:   09/27/13 1542 09/27/13 2100 09/28/13 0700 09/28/13 1350  BP: 118/77 124/71 127/76 107/70  Pulse: 92 73 77 71  Temp: 97.4 F (36.3 C) 98.6 F (37 C) 98.6 F (37 C) 98.2 F (36.8 C)  TempSrc: Oral Oral Oral Oral  Resp: 20 22 18 18   Height:      Weight:   104.146 kg (229 lb 9.6 oz)   SpO2: 94% 98% 99% 97%    Intake/Output Summary (Last 24 hours) at 09/28/13 1745 Last data filed at 09/28/13  1517  Gross per 24 hour  Intake    323 ml  Output   1650 ml  Net  -1327 ml   Filed Weights   09/26/13 1329 09/27/13 0506 09/28/13 0700  Weight: 107.004 kg (235 lb 14.4 oz) 106.142 kg (234 lb) 104.146 kg (229 lb 9.6 oz)     Exam:  General exam: Pleasant middle-aged male sitting up on chair this morning in no obvious distress. Seen ambulating with nursing. Respiratory system: Slightly diminished breath sounds in the bases with a few basal crackles. Rest of lung fields clear to auscultation. No increased work of breathing. Cardiovascular system: S1 & S2 heard, RRR. No JVD, murmurs, gallops, clicks. Trace pitting leg edema.  Gastrointestinal system: Abdomen is nondistended, soft and nontender. Normal bowel sounds heard. Central nervous system: Alert and oriented x2. No focal neurological deficits. Extremities: Symmetric 5 x 5 power.   Data Reviewed: Basic Metabolic Panel:  Recent Labs Lab 09/26/13 0954 09/27/13 0655 09/28/13 0325  NA 138 141 140  K 4.5 4.5 4.2  CL 92* 94* 90*  CO2 33* 39* 41*  GLUCOSE 191* 145* 137*  BUN 25* 21 20  CREATININE 1.07 1.14 1.20  CALCIUM 8.4 8.7 8.8   Liver Function Tests: No results found for this basename: AST, ALT, ALKPHOS, BILITOT, PROT, ALBUMIN,  in the last 168 hours No results found for this basename: LIPASE, AMYLASE,  in the last 168 hours No results found for this basename: AMMONIA,  in the last 168 hours CBC:  Recent Labs Lab 09/26/13 0954 09/28/13 0325  WBC 10.6* 9.7  NEUTROABS 9.0*  --  HGB 15.8 16.3  HCT 50.7 52.4*  MCV 88.8 88.4  PLT 124* 114*   Cardiac Enzymes:  Recent Labs Lab 09/26/13 0954  TROPONINI <0.30   BNP (last 3 results)  Recent Labs  09/26/13 0954 09/28/13 0325  PROBNP 1777.0* 184.5*   CBG:  Recent Labs Lab 09/27/13 1727 09/27/13 2151 09/28/13 0737 09/28/13 1135 09/28/13 1626  GLUCAP 153* 212* 130* 222* 132*    No results found for this or any previous visit (from the past 240  hour(s)).    Additional labs: 1. 2-D echo 09/26/13: Study Conclusions  - Left ventricle: The cavity size was normal. There was moderate concentric hypertrophy. Systolic function was normal. The estimated ejection fraction was in the range of 55% to 60%. Wall motion was normal; there were no regional wall motion abnormalities. Doppler parameters are consistent with abnormal left ventricular relaxation (grade 1 diastolic dysfunction). Doppler parameters are consistent with elevated ventricular end-diastolic filling pressure. - Aortic valve: Trileaflet; mildly thickened, mildly calcified leaflets. Sclerosis without stenosis. There was no regurgitation. - Aortic root: The aortic root was normal in size. - Left atrium: The atrium was normal in size. - Right ventricle: The cavity size was normal. Wall thickness was normal. Systolic function was normal. - Right atrium: The atrium was normal in size. - Tricuspid valve: There was no regurgitation. - Pulmonary arteries: Systolic pressure was within the normal range. - Systemic veins: Not visualized.  Impressions:  - Normal biventricular size and function. Impaired relaxation with elevated filling pressures.      Studies: No results found.      Scheduled Meds: . antiseptic oral rinse  15 mL Mouth Rinse BID  . atenolol  50 mg Oral Daily  . atorvastatin  10 mg Oral Daily  . enoxaparin (LOVENOX) injection  40 mg Subcutaneous Q24H  . fesoterodine  8 mg Oral Daily  . furosemide  40 mg Oral Daily  . insulin aspart  0-9 Units Subcutaneous TID WC  . pantoprazole  40 mg Oral BID  . sertraline  50 mg Oral Daily  . sodium chloride  3 mL Intravenous Q12H   Continuous Infusions:   Principal Problem:   Acute diastolic CHF (congestive heart failure) Active Problems:   Acute respiratory failure with hypoxia   Mental disorder   Diabetes mellitus type 2, uncontrolled   Hypertension   Dyslipidemia   Acute CHF    Time spent: 30  minutes    Modena Jansky, MD, FACP, [facility]. [facility] Pager [phone]  If 7PM-7AM, please contact night-coverage [email or web address] Password [email or web address] 09/28/2013, 5:45 PM    LOS: 2 days

## 2013-09-28 NOTE — Progress Notes (Signed)
HF zone tool and educational folder has been given to the mother of patient and education has been competed with teach back.  The mother is the primary learner for the patient and she has no further questions for now.

## 2013-09-28 NOTE — Progress Notes (Signed)
Critical Lab- CO2-41 Paged hosplitalist at 0423 Pt not symptomatic.

## 2013-09-29 LAB — BASIC METABOLIC PANEL
BUN: 21 mg/dL (ref 6–23)
CALCIUM: 8.5 mg/dL (ref 8.4–10.5)
CO2: 37 mEq/L — ABNORMAL HIGH (ref 19–32)
CREATININE: 1.27 mg/dL (ref 0.50–1.35)
Chloride: 87 mEq/L — ABNORMAL LOW (ref 96–112)
GFR calc non Af Amer: 63 mL/min — ABNORMAL LOW (ref >90)
GFR, EST AFRICAN AMERICAN: 74 mL/min — AB (ref >90)
Glucose, Bld: 164 mg/dL — ABNORMAL HIGH (ref 70–99)
Potassium: 3.9 mEq/L (ref 3.7–5.3)
Sodium: 133 mEq/L — ABNORMAL LOW (ref 137–147)

## 2013-09-29 LAB — GLUCOSE, CAPILLARY
GLUCOSE-CAPILLARY: 222 mg/dL — AB (ref 70–99)
Glucose-Capillary: 146 mg/dL — ABNORMAL HIGH (ref 70–99)
Glucose-Capillary: 146 mg/dL — ABNORMAL HIGH (ref 70–99)

## 2013-09-29 LAB — CBC
HCT: 51 % (ref 39.0–52.0)
Hemoglobin: 16 g/dL (ref 13.0–17.0)
MCH: 27.5 pg (ref 26.0–34.0)
MCHC: 31.4 g/dL (ref 30.0–36.0)
MCV: 87.6 fL (ref 78.0–100.0)
PLATELETS: 117 10*3/uL — AB (ref 150–400)
RBC: 5.82 MIL/uL — ABNORMAL HIGH (ref 4.22–5.81)
RDW: 17.4 % — AB (ref 11.5–15.5)
WBC: 10.6 10*3/uL — AB (ref 4.0–10.5)

## 2013-09-29 MED ORDER — METFORMIN HCL 500 MG PO TABS: 1000.0000 mg | ORAL_TABLET | Freq: Two times a day (BID) | ORAL | Status: DC

## 2013-09-29 MED ORDER — FUROSEMIDE 20 MG PO TABS: 40.0000 mg | ORAL_TABLET | Freq: Every day | ORAL | Status: DC

## 2013-09-29 NOTE — Discharge Summary (Signed)
Physician Discharge Summary  Tony Jones ZOX:[id number] DOB: 1960-06-06 DOA: 09/26/2013  PCP: Woody Seller, MD  Admit date: 09/26/2013 Discharge date: 09/29/2013  Time spent: Less than 30 minutes  Recommendations for Outpatient Follow-up:  1. Dr. Kathryne Eriksson, PCP in 3 days with repeat labs (CBC & BMP).  Discharge Diagnoses:  Principal Problem:   Acute diastolic CHF (congestive heart failure) Active Problems:   Acute respiratory failure with hypoxia   Mental disorder   Diabetes mellitus type 2, uncontrolled   Hypertension   Dyslipidemia   Acute CHF   Discharge Condition: Improved & Stable  Diet recommendation: Heart Healthy & Diabetic diet.  Filed Weights   09/27/13 0506 09/28/13 0700 09/29/13 0513  Weight: 106.142 kg (234 lb) 104.146 kg (229 lb 9.6 oz) 104.6 kg (230 lb 9.6 oz)    History of present illness:  53 y.o. male with history of mental retardation, type II DM, HTN, dyslipidemia, GERD, kidney stones, presented to the ED on 09/26/13 with complaints of worsening dyspnea. He was found to be hypoxic and had features of decompensated CHF.  Hospital Course:   1. Acute diastolic CHF: Patient was admitted to telemetry. 2-D echo shows normal EF and grade 1 diastolic dysfunction. Started on IV Lasix with good diuresis and symptomatic improvement. Continue atenolol. TSH normal. Improving. Switched to PO Lasix on 5/29. Contraction alkalosis on labs- improving. Will discharge patient on 40 mg Lasix daily. Advised mom regarding daily weights, salt restriction, medication compliance and followup with PCP in a couple of days and she verbalized understanding. 2. Acute hypoxic respiratory failure: Secondary to decompensated CHF. Resolved after treatment of decompensated CHF. Oxygen saturations on room air with and without activity are 89% and greater. Patient may well have complement of OSA but according to mother, may not cooperate with sleep study or nightly CPAP. She has home pulse  oximetry and is advised to periodically check his oxygen saturations. 3. Uncontrolled type II DM: Hemoglobin A1c 8.4 suggesting poor outpatient control. Treated with sliding scale insulin the hospital. Will increase metformin to 1000 mg twice a day. Outpatient followup with PCP.  4. Hypertension: Controlled. Continue atenolol. 5. Thrombocytopenia: Stable. ? Etiology.? Chronic. Followup with CBCs as outpatient. 6. History of mental retardation: Mental status at baseline. 7. History of dyslipidemia: Continue statins.   Consultations:  None  Procedures:  None    Discharge Exam:  Complaints:  Patient denies complaints. Denies dyspnea. Mother at bedside states that patient is "100% better" and patient is anxious to go home.  Filed Vitals:   09/28/13 2101 09/29/13 0513 09/29/13 0818 09/29/13 0826  BP: 106/64 109/70    Pulse: 68 77    Temp: 98 F (36.7 C) 98.3 F (36.8 C)    TempSrc: Oral Oral    Resp: 19 18    Height:      Weight:  104.6 kg (230 lb 9.6 oz)    SpO2: 91% 90% 93% 89%    General exam: Pleasant middle-aged male seen ambulating comfortably in the halls.  Respiratory system: Occasional basal crackles but otherwise clear to auscultation. No increased work of breathing.  Cardiovascular system: S1 & S2 heard, RRR. No JVD, murmurs, gallops, clicks. Trace pitting leg edema.  Gastrointestinal system: Abdomen is nondistended, soft and nontender. Normal bowel sounds heard.  Central nervous system: Alert and oriented x2. No focal neurological deficits.  Extremities: Symmetric 5 x 5 power.   Discharge Instructions      Discharge Instructions   (HEART FAILURE PATIENTS) Call MD:  Anytime you have any of the following symptoms: 1) 3 pound weight gain in 24 hours or 5 pounds in 1 week 2) shortness of breath, with or without a dry hacking cough 3) swelling in the hands, feet or stomach 4) if you have to sleep on extra pillows at night in order to breathe.    Complete by:  As  directed      Call MD for:  difficulty breathing, headache or visual disturbances    Complete by:  As directed      Diet - low sodium heart healthy    Complete by:  As directed      Diet Carb Modified    Complete by:  As directed      Increase activity slowly    Complete by:  As directed             Medication List         acetaminophen 500 MG tablet  Commonly known as:  TYLENOL  Take 500 mg by mouth every 6 (six) hours as needed for mild pain.     atenolol 50 MG tablet  Commonly known as:  TENORMIN  Take 50 mg by mouth daily.     atorvastatin 10 MG tablet  Commonly known as:  LIPITOR  Take 10 mg by mouth daily.     furosemide 20 MG tablet  Commonly known as:  LASIX  Take 2 tablets (40 mg total) by mouth daily.     metFORMIN 500 MG tablet  Commonly known as:  GLUCOPHAGE  Take 2 tablets (1,000 mg total) by mouth 2 (two) times daily with a meal.     pantoprazole 40 MG tablet  Commonly known as:  PROTONIX  Take 40 mg by mouth 2 (two) times daily.     sertraline 50 MG tablet  Commonly known as:  ZOLOFT  Take 50 mg by mouth daily.     TOVIAZ 8 MG Tb24 tablet  Generic drug:  fesoterodine  Take 8 mg by mouth daily.     triamcinolone cream 0.1 %  Commonly known as:  KENALOG  Apply 1 application topically daily as needed (rash to back, face, neck, back of knees.).       Follow-up Information   Follow up with Woody Seller, MD. Schedule an appointment as soon as possible for a visit in 3 days. (To be seen with repeat labs (CBC & BMP). To be seen for heart failure and diabetes follow up.)    Specialty:  Family Medicine   Contact information:   [address] [place] [address] [phone]        The results of significant diagnostics from this hospitalization (including imaging, microbiology, ancillary and laboratory) are listed below for reference.    Significant Diagnostic Studies: Dg Chest 2 View  09/26/2013   CLINICAL DATA:  Altered mental  status.  Short of breath.  EXAM: CHEST  2 VIEW  COMPARISON:  09/27/2012.  FINDINGS: Cardiac silhouette is mild to moderately enlarged. Normal mediastinal contours. No convincing hilar mass.  Lungs show mild vascular congestion. There is mild interstitial thickening. Findings are less prominent than on the prior study. This still may reflect mild congestive heart failure. No focal lung consolidation is seen to suggest pneumonia. No pleural effusion or pneumothorax. The bony thorax is intact.  IMPRESSION: Mild congestive heart failure is suspected. No evidence of pneumonia.   Electronically Signed   By: Lajean Manes M.D.   On: 09/26/2013 10:28  Microbiology: No results found for this or any previous visit (from the past 240 hour(s)).   Labs: Basic Metabolic Panel:  Recent Labs Lab 09/26/13 0954 09/27/13 0655 09/28/13 0325 09/29/13 0504  NA 138 141 140 133*  K 4.5 4.5 4.2 3.9  CL 92* 94* 90* 87*  CO2 33* 39* 41* 37*  GLUCOSE 191* 145* 137* 164*  BUN 25* 21 20 21   CREATININE 1.07 1.14 1.20 1.27  CALCIUM 8.4 8.7 8.8 8.5   Liver Function Tests: No results found for this basename: AST, ALT, ALKPHOS, BILITOT, PROT, ALBUMIN,  in the last 168 hours No results found for this basename: LIPASE, AMYLASE,  in the last 168 hours No results found for this basename: AMMONIA,  in the last 168 hours CBC:  Recent Labs Lab 09/26/13 0954 09/28/13 0325 09/29/13 0504  WBC 10.6* 9.7 10.6*  NEUTROABS 9.0*  --   --   HGB 15.8 16.3 16.0  HCT 50.7 52.4* 51.0  MCV 88.8 88.4 87.6  PLT 124* 114* 117*   Cardiac Enzymes:  Recent Labs Lab 09/26/13 0954  TROPONINI <0.30   BNP: BNP (last 3 results)  Recent Labs  09/26/13 0954 09/28/13 0325  PROBNP 1777.0* 184.5*   CBG:  Recent Labs Lab 09/28/13 0737 09/28/13 1135 09/28/13 1626 09/28/13 2106 09/29/13 0822  GLUCAP 130* 222* 132* 146* 146*    Additional labs:   2-D echo 09/26/13: Study Conclusions  - Left ventricle: The cavity size  was normal. There was moderate concentric hypertrophy. Systolic function was normal. The estimated ejection fraction was in the range of 55% to 60%. Wall motion was normal; there were no regional wall motion abnormalities. Doppler parameters are consistent with abnormal left ventricular relaxation (grade 1 diastolic dysfunction). Doppler parameters are consistent with elevated ventricular end-diastolic filling pressure. - Aortic valve: Trileaflet; mildly thickened, mildly calcified leaflets. Sclerosis without stenosis. There was no regurgitation. - Aortic root: The aortic root was normal in size. - Left atrium: The atrium was normal in size. - Right ventricle: The cavity size was normal. Wall thickness was normal. Systolic function was normal. - Right atrium: The atrium was normal in size. - Tricuspid valve: There was no regurgitation. - Pulmonary arteries: Systolic pressure was within the normal range. - Systemic veins: Not visualized.  Impressions:  - Normal biventricular size and function. Impaired relaxation with elevated filling pressures.  TSH: 2.680  Hemoglobin A1c: 8.4   Signed:  Modena Jansky, MD, FACP, [facility]. [facility] Pager [phone]  If 7PM-7AM, please contact night-coverage [email or web address] Password [email or web address] 09/29/2013, 11:32 AM

## 2013-09-29 NOTE — Discharge Instructions (Signed)
Heart Failure °Heart failure is a condition in which the heart has trouble pumping blood. This means your heart does not pump blood efficiently for your body to work well. In some cases of heart failure, fluid may back up into your lungs or you may have swelling (edema) in your lower legs. Heart failure is usually a long-term (chronic) condition. It is important for you to take good care of yourself and follow your caregiver's treatment plan. °CAUSES  °Some health conditions can cause heart failure. Those health conditions include: °· High blood pressure (hypertension) causes the heart muscle to work harder than normal. When pressure in the blood vessels is high, the heart needs to pump (contract) with more force in order to circulate blood throughout the body. High blood pressure eventually causes the heart to become stiff and weak. °· Coronary artery disease (CAD) is the buildup of cholesterol and fat (plaque) in the arteries of the heart. The blockage in the arteries deprives the heart muscle of oxygen and blood. This can cause chest pain and may lead to a heart attack. High blood pressure can also contribute to CAD. °· Heart attack (myocardial infarction) occurs when 1 or more arteries in the heart become blocked. The loss of oxygen damages the muscle tissue of the heart. When this happens, part of the heart muscle dies. The injured tissue does not contract as well and weakens the heart's ability to pump blood. °· Abnormal heart valves can cause heart failure when the heart valves do not open and close properly. This makes the heart muscle pump harder to keep the blood flowing. °· Heart muscle disease (cardiomyopathy or myocarditis) is damage to the heart muscle from a variety of causes. These can include drug or alcohol abuse, infections, or unknown reasons. These can increase the risk of heart failure. °· Lung disease makes the heart work harder because the lungs do not work properly. This can cause a strain  on the heart, leading it to fail. °· Diabetes increases the risk of heart failure. High blood sugar contributes to high fat (lipid) levels in the blood. Diabetes can also cause slow damage to tiny blood vessels that carry important nutrients to the heart muscle. When the heart does not get enough oxygen and food, it can cause the heart to become weak and stiff. This leads to a heart that does not contract efficiently. °· Other conditions can contribute to heart failure. These include abnormal heart rhythms, thyroid problems, and low blood counts (anemia). °Certain unhealthy behaviors can increase the risk of heart failure. Those unhealthy behaviors include: °· Being overweight. °· Smoking or chewing tobacco. °· Eating foods high in fat and cholesterol. °· Abusing illicit drugs or alcohol. °· Lacking physical activity. °SYMPTOMS  °Heart failure symptoms may vary and can be hard to detect. Symptoms may include: °· Shortness of breath with activity, such as climbing stairs. °· Persistent cough. °· Swelling of the feet, ankles, legs, or abdomen. °· Unexplained weight gain. °· Difficulty breathing when lying flat (orthopnea). °· Waking from sleep because of the need to sit up and get more air. °· Rapid heartbeat. °· Fatigue and loss of energy. °· Feeling lightheaded, dizzy, or close to fainting. °· Loss of appetite. °· Nausea. °· Increased urination during the night (nocturia). °DIAGNOSIS  °A diagnosis of heart failure is based on your history, symptoms, physical examination, and diagnostic tests. °Diagnostic tests for heart failure may include: °· Echocardiography. °· Electrocardiography. °· Chest X-ray. °· Blood tests. °· Exercise   stress test. °· Cardiac angiography. °· Radionuclide scans. °TREATMENT  °Treatment is aimed at managing the symptoms of heart failure. Medicines, behavioral changes, or surgical intervention may be necessary to treat heart failure. °· Medicines to help treat heart failure may  include: °· Angiotensin-converting enzyme (ACE) inhibitors. This type of medicine blocks the effects of a blood protein called angiotensin-converting enzyme. ACE inhibitors relax (dilate) the blood vessels and help lower blood pressure. °· Angiotensin receptor blockers. This type of medicine blocks the actions of a blood protein called angiotensin. Angiotensin receptor blockers dilate the blood vessels and help lower blood pressure. °· Water pills (diuretics). Diuretics cause the kidneys to remove salt and water from the blood. The extra fluid is removed through urination. This loss of extra fluid lowers the volume of blood the heart pumps. °· Beta blockers. These prevent the heart from beating too fast and improve heart muscle strength. °· Digitalis. This increases the force of the heartbeat. °· Healthy behavior changes include: °· Obtaining and maintaining a healthy weight. °· Stopping smoking or chewing tobacco. °· Eating heart healthy foods. °· Limiting or avoiding alcohol. °· Stopping illicit drug use. °· Physical activity as directed by your caregiver. °· Surgical treatment for heart failure may include: °· A procedure to open blocked arteries, repair damaged heart valves, or remove damaged heart muscle tissue. °· A pacemaker to improve heart muscle function and control certain abnormal heart rhythms. °· An internal cardioverter defibrillator to treat certain serious abnormal heart rhythms. °· A left ventricular assist device to assist the pumping ability of the heart. °HOME CARE INSTRUCTIONS  °· Take your medicine as directed by your caregiver. Medicines are important in reducing the workload of your heart, slowing the progression of heart failure, and improving your symptoms. °· Do not stop taking your medicine unless directed by your caregiver. °· Do not skip any dose of medicine. °· Refill your prescriptions before you run out of medicine. Your medicines are needed every day. °· Take over-the-counter  medicine only as directed by your caregiver or pharmacist. °· Engage in moderate physical activity if directed by your caregiver. Moderate physical activity can benefit some people. The elderly and people with severe heart failure should consult with a caregiver for physical activity recommendations. °· Eat heart healthy foods. Food choices should be free of trans fat and low in saturated fat, cholesterol, and salt (sodium). Healthy choices include fresh or frozen fruits and vegetables, fish, lean meats, legumes, fat-free or low-fat dairy products, and whole grain or high fiber foods. Talk to a dietitian to learn more about heart healthy foods. °· Limit sodium if directed by your caregiver. Sodium restriction may reduce symptoms of heart failure in some people. Talk to a dietitian to learn more about heart healthy seasonings. °· Use healthy cooking methods. Healthy cooking methods include roasting, grilling, broiling, baking, poaching, steaming, or stir-frying. Talk to a dietitian to learn more about healthy cooking methods. °· Limit fluids if directed by your caregiver. Fluid restriction may reduce symptoms of heart failure in some people. °· Weigh yourself every day. Daily weights are important in the early recognition of excess fluid. You should weigh yourself every morning after you urinate and before you eat breakfast. Wear the same amount of clothing each time you weigh yourself. Record your daily weight. Provide your caregiver with your weight record. °· Monitor and record your blood pressure if directed by your caregiver. °· Check your pulse if directed by your caregiver. °· Lose weight if directed   by your caregiver. Weight loss may reduce symptoms of heart failure in some people. °· Stop smoking or chewing tobacco. Nicotine makes your heart work harder by causing your blood vessels to constrict. Do not use nicotine gum or patches before talking to your caregiver. °· Schedule and attend follow-up visits as  directed by your caregiver. It is important to keep all your appointments. °· Limit alcohol intake to no more than 1 drink per day for nonpregnant women and 2 drinks per day for men. Drinking more than that is harmful to your heart. Tell your caregiver if you drink alcohol several times a week. Talk with your caregiver about whether alcohol is safe for you. If your heart has already been damaged by alcohol or you have severe heart failure, drinking alcohol should be stopped completely. °· Stop illicit drug use. °· Stay up-to-date with immunizations. It is especially important to prevent respiratory infections through current pneumococcal and influenza immunizations. °· Manage other health conditions such as hypertension, diabetes, thyroid disease, or abnormal heart rhythms as directed by your caregiver. °· Learn to manage stress. °· Plan rest periods when fatigued. °· Learn strategies to manage high temperatures. If the weather is extremely hot: °· Avoid vigorous physical activity. °· Use air conditioning or fans or seek a cooler location. °· Avoid caffeine and alcohol. °· Wear loose-fitting, lightweight, and light-colored clothing. °· Learn strategies to manage cold temperatures. If the weather is extremely cold: °· Avoid vigorous physical activity. °· Layer clothes. °· Wear mittens or gloves, a hat, and a scarf when going outside. °· Avoid alcohol. °· Obtain ongoing education and support as needed. °· Participate or seek rehabilitation as needed to maintain or improve independence and quality of life. °SEEK MEDICAL CARE IF:  °· Your weight increases by 03 lb/1.4 kg in 1 day or 05 lb/2.3 kg in a week. °· You have increasing shortness of breath that is unusual for you. °· You are unable to participate in your usual physical activities. °· You tire easily. °· You cough more than normal, especially with physical activity. °· You have any or more swelling in areas such as your hands, feet, ankles, or abdomen. °· You  are unable to sleep because it is hard to breathe. °· You feel like your heart is beating fast (palpitations). °· You become dizzy or lightheaded upon standing up. °SEEK IMMEDIATE MEDICAL CARE IF:  °· You have difficulty breathing. °· There is a change in mental status such as decreased alertness or difficulty with concentration. °· You have a pain or discomfort in your chest. °· You have an episode of fainting (syncope). °MAKE SURE YOU:  °· Understand these instructions. °· Will watch your condition. °· Will get help right away if you are not doing well or get worse. °Document Released: 04/19/2005 Document Revised: 08/14/2012 Document Reviewed: 05/11/2012 °Exit[employer]® Patient Information ©2014 [employer]. ° °

## 2013-09-29 NOTE — Progress Notes (Addendum)
CARE MANAGEMENT NOTE 09/29/2013  Patient:  Tony Jones, Tony Jones   Account Number:  [id number]  Date Initiated:  09/27/2013  Documentation initiated by:  McGIBBONEY,COOKIE  Subjective/Objective Assessment:   Pt admitted with new CHF     Action/Plan:   from home   Anticipated DC Date:  09/29/2013   Anticipated DC Plan:  [place]  CM consult      [facility] Choice  HOME HEALTH   Choice offered to / List presented to:  C-6 Parent        [place] arranged  HH-1 RN  [place]      [place] agency  [place]   Status of service:  Completed, signed off Medicare Important Message given?  YES (If response is "NO", the following Medicare IM given date fields will be blank) Date Medicare IM given:  09/26/2013 Date Additional Medicare IM given:  09/29/2013  Discharge Disposition:  [place]  Per UR Regulation:  Reviewed for med. necessity/level of care/duration of stay  If discussed at [place] of Stay Meetings, dates discussed:    Comments:  09/29/2013 1300 NCM spoke to mother, and offered choice for [facility]. Mother agreeable to [facility] for [facility]. Notified [facility] for new referral. Additional Medicare IM given, placed on chart.   Jonnie Finner RN CCM Case Mgmt phone [phone]  09/27/13 MMCGIBBONEY, RN, BSN

## 2013-09-29 NOTE — Progress Notes (Signed)
Pt being discharged with his mother at this time. Discharge instructions/prescriptions given/explained with pt's mother verbalizing understanding.  Pt alert and "ready to go". Followup appointments noted.

## 2014-06-19 ENCOUNTER — Encounter: Payer: Self-pay | Admitting: Podiatry

## 2014-06-19 ENCOUNTER — Ambulatory Visit (INDEPENDENT_AMBULATORY_CARE_PROVIDER_SITE_OTHER): Payer: Medicare Other | Admitting: Podiatry

## 2014-06-19 ENCOUNTER — Ambulatory Visit (INDEPENDENT_AMBULATORY_CARE_PROVIDER_SITE_OTHER): Payer: Medicare Other

## 2014-06-19 ENCOUNTER — Ambulatory Visit: Payer: Medicare Other

## 2014-06-19 VITALS — BP 154/55 | HR 91 | Resp 16

## 2014-06-19 DIAGNOSIS — M79672 Pain in left foot: Secondary | ICD-10-CM

## 2014-06-19 DIAGNOSIS — M722 Plantar fascial fibromatosis: Secondary | ICD-10-CM

## 2014-06-19 MED ORDER — TRIAMCINOLONE ACETONIDE 10 MG/ML IJ SUSP
10.0000 mg | Freq: Once | INTRAMUSCULAR | Status: AC
  Administered 2014-06-19: 10 mg

## 2014-06-19 NOTE — Progress Notes (Signed)
   Subjective:    Patient ID: Tony Jones, male    DOB: Sep 18, 1960, 54 y.o.   MRN: [id number]  HPI Pt presents with plantar fascial pain in left foot, and intermittent pain in right ankle   Review of Systems  Respiratory: Positive for chest tightness and shortness of breath.   Cardiovascular: Positive for chest pain and leg swelling.  Gastrointestinal: Positive for nausea and diarrhea.  Endocrine: Positive for polydipsia.  Musculoskeletal: Positive for back pain and gait problem.  All other systems reviewed and are negative.      Objective:   Physical Exam        Assessment & Plan:

## 2014-06-19 NOTE — Progress Notes (Signed)
Subjective:     Patient ID: Tony Jones, male   DOB: [birth year], 54 y.o.   MRN: [id number]  HPI patient presents with mother who states that he has a lot of pain in his left arch and is having trouble walking or being comfortable   Review of Systems  All other systems reviewed and are negative.      Objective:   Physical Exam  Cardiovascular: Intact distal pulses.   Musculoskeletal: Normal range of motion.  Neurological: He is alert.  Skin: Skin is warm.  Nursing note and vitals reviewed.  neurovascular status intact with muscle strength adequate range of motion within normal limits. Patient is noted to have good digital perfusion is well oriented especially with his mother available and does have significant discomfort in the left mid arch area with inflammation noted. 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 Injected the mid arch area left 3 mg Kenalog 5 mill grams Xylocaine and applied fascial brace with instructions on usage. Reappoint to recheck

## 2014-06-19 NOTE — Patient Instructions (Signed)

## 2015-01-02 HISTORY — PX: TRANSTHORACIC ECHOCARDIOGRAM: SHX275

## 2015-01-29 ENCOUNTER — Inpatient Hospital Stay (HOSPITAL_COMMUNITY)
Admission: EM | Admit: 2015-01-29 | Discharge: 2015-02-03 | DRG: 291 | Disposition: A | Discharge status: Home Health | Payer: Medicare Other | Attending: Internal Medicine | Admitting: Internal Medicine

## 2015-01-29 ENCOUNTER — Encounter (HOSPITAL_COMMUNITY): Payer: Self-pay | Admitting: General Practice

## 2015-01-29 ENCOUNTER — Emergency Department (HOSPITAL_COMMUNITY): Payer: Medicare Other

## 2015-01-29 DIAGNOSIS — R0602 Shortness of breath: Secondary | ICD-10-CM | POA: Diagnosis present

## 2015-01-29 DIAGNOSIS — Z79899 Other long term (current) drug therapy: Secondary | ICD-10-CM | POA: Diagnosis not present

## 2015-01-29 DIAGNOSIS — R0600 Dyspnea, unspecified: Secondary | ICD-10-CM | POA: Diagnosis not present

## 2015-01-29 DIAGNOSIS — F79 Unspecified intellectual disabilities: Secondary | ICD-10-CM | POA: Diagnosis present

## 2015-01-29 DIAGNOSIS — Z7984 Long term (current) use of oral hypoglycemic drugs: Secondary | ICD-10-CM

## 2015-01-29 DIAGNOSIS — K5900 Constipation, unspecified: Secondary | ICD-10-CM | POA: Diagnosis present

## 2015-01-29 DIAGNOSIS — M79606 Pain in leg, unspecified: Secondary | ICD-10-CM

## 2015-01-29 DIAGNOSIS — D696 Thrombocytopenia, unspecified: Secondary | ICD-10-CM | POA: Diagnosis present

## 2015-01-29 DIAGNOSIS — J9601 Acute respiratory failure with hypoxia: Secondary | ICD-10-CM

## 2015-01-29 DIAGNOSIS — I5033 Acute on chronic diastolic (congestive) heart failure: Principal | ICD-10-CM

## 2015-01-29 DIAGNOSIS — I5031 Acute diastolic (congestive) heart failure: Secondary | ICD-10-CM | POA: Diagnosis not present

## 2015-01-29 DIAGNOSIS — R609 Edema, unspecified: Secondary | ICD-10-CM

## 2015-01-29 DIAGNOSIS — J9600 Acute respiratory failure, unspecified whether with hypoxia or hypercapnia: Secondary | ICD-10-CM | POA: Diagnosis not present

## 2015-01-29 DIAGNOSIS — I5032 Chronic diastolic (congestive) heart failure: Secondary | ICD-10-CM

## 2015-01-29 DIAGNOSIS — E785 Hyperlipidemia, unspecified: Secondary | ICD-10-CM | POA: Diagnosis present

## 2015-01-29 DIAGNOSIS — I5023 Acute on chronic systolic (congestive) heart failure: Secondary | ICD-10-CM

## 2015-01-29 DIAGNOSIS — K2270 Barrett's esophagus without dysplasia: Secondary | ICD-10-CM | POA: Diagnosis present

## 2015-01-29 DIAGNOSIS — E119 Type 2 diabetes mellitus without complications: Secondary | ICD-10-CM

## 2015-01-29 DIAGNOSIS — F99 Mental disorder, not otherwise specified: Secondary | ICD-10-CM

## 2015-01-29 DIAGNOSIS — K219 Gastro-esophageal reflux disease without esophagitis: Secondary | ICD-10-CM | POA: Diagnosis present

## 2015-01-29 DIAGNOSIS — I10 Essential (primary) hypertension: Secondary | ICD-10-CM | POA: Diagnosis not present

## 2015-01-29 DIAGNOSIS — I110 Hypertensive heart disease with heart failure: Secondary | ICD-10-CM | POA: Diagnosis present

## 2015-01-29 DIAGNOSIS — I509 Heart failure, unspecified: Secondary | ICD-10-CM

## 2015-01-29 LAB — CBC WITH DIFFERENTIAL/PLATELET
BASOS PCT: 0 %
Basophils Absolute: 0 10*3/uL (ref 0.0–0.1)
EOS ABS: 0.1 10*3/uL (ref 0.0–0.7)
Eosinophils Relative: 1 %
HCT: 45.7 % (ref 39.0–52.0)
HEMOGLOBIN: 14 g/dL (ref 13.0–17.0)
Lymphocytes Relative: 7 %
Lymphs Abs: 0.7 10*3/uL (ref 0.7–4.0)
MCH: 26.1 pg (ref 26.0–34.0)
MCHC: 30.6 g/dL (ref 30.0–36.0)
MCV: 85.3 fL (ref 78.0–100.0)
Monocytes Absolute: 0.6 10*3/uL (ref 0.1–1.0)
Monocytes Relative: 7 %
NEUTROS ABS: 8 10*3/uL — AB (ref 1.7–7.7)
NEUTROS PCT: 85 %
Platelets: 156 10*3/uL (ref 150–400)
RBC: 5.36 MIL/uL (ref 4.22–5.81)
RDW: 18.1 % — ABNORMAL HIGH (ref 11.5–15.5)
WBC: 9.5 10*3/uL (ref 4.0–10.5)

## 2015-01-29 LAB — I-STAT VENOUS BLOOD GAS, ED
Acid-Base Excess: 13 mmol/L — ABNORMAL HIGH (ref 0.0–2.0)
Acid-Base Excess: 13 mmol/L — ABNORMAL HIGH (ref 0.0–2.0)
Bicarbonate: 39.3 mEq/L — ABNORMAL HIGH (ref 20.0–24.0)
Bicarbonate: 40.3 mEq/L — ABNORMAL HIGH (ref 20.0–24.0)
O2 SAT: 52 %
O2 SAT: 73 %
PH VEN: 7.445 — AB (ref 7.250–7.300)
PH VEN: 7.484 — AB (ref 7.250–7.300)
PO2 VEN: 27 mmHg — AB (ref 30.0–45.0)
PO2 VEN: 37 mmHg (ref 30.0–45.0)
Patient temperature: 98.6
Patient temperature: 98.6
TCO2: 41 mmol/L (ref 0–100)
TCO2: 42 mmol/L (ref 0–100)
pCO2, Ven: 52.3 mmHg — ABNORMAL HIGH (ref 45.0–50.0)
pCO2, Ven: 58.6 mmHg — ABNORMAL HIGH (ref 45.0–50.0)

## 2015-01-29 LAB — COMPREHENSIVE METABOLIC PANEL
ALT: 32 U/L (ref 17–63)
AST: 39 U/L (ref 15–41)
Albumin: 3 g/dL — ABNORMAL LOW (ref 3.5–5.0)
Alkaline Phosphatase: 79 U/L (ref 38–126)
Anion gap: 14 (ref 5–15)
BUN: 32 mg/dL — ABNORMAL HIGH (ref 6–20)
CALCIUM: 9.6 mg/dL (ref 8.9–10.3)
CHLORIDE: 90 mmol/L — AB (ref 101–111)
CO2: 33 mmol/L — ABNORMAL HIGH (ref 22–32)
CREATININE: 1.26 mg/dL — AB (ref 0.61–1.24)
Glucose, Bld: 121 mg/dL — ABNORMAL HIGH (ref 65–99)
Potassium: 4.9 mmol/L (ref 3.5–5.1)
Sodium: 137 mmol/L (ref 135–145)
TOTAL PROTEIN: 6.7 g/dL (ref 6.5–8.1)
Total Bilirubin: 0.9 mg/dL (ref 0.3–1.2)

## 2015-01-29 LAB — URINALYSIS, ROUTINE W REFLEX MICROSCOPIC
BILIRUBIN URINE: NEGATIVE
Glucose, UA: NEGATIVE mg/dL
Hgb urine dipstick: NEGATIVE
KETONES UR: NEGATIVE mg/dL
Leukocytes, UA: NEGATIVE
NITRITE: NEGATIVE
Protein, ur: NEGATIVE mg/dL
Specific Gravity, Urine: 1.01 (ref 1.005–1.030)
UROBILINOGEN UA: 0.2 mg/dL (ref 0.0–1.0)
pH: 7.5 (ref 5.0–8.0)

## 2015-01-29 LAB — I-STAT TROPONIN, ED: TROPONIN I, POC: 0.02 ng/mL (ref 0.00–0.08)

## 2015-01-29 LAB — GLUCOSE, CAPILLARY: Glucose-Capillary: 158 mg/dL — ABNORMAL HIGH (ref 65–99)

## 2015-01-29 LAB — BRAIN NATRIURETIC PEPTIDE: B NATRIURETIC PEPTIDE 5: 387.1 pg/mL — AB (ref 0.0–100.0)

## 2015-01-29 MED ORDER — FUROSEMIDE 10 MG/ML IJ SOLN
40.0000 mg | Freq: Once | INTRAMUSCULAR | Status: AC
  Administered 2015-01-29: 40 mg via INTRAVENOUS
  Filled 2015-01-29: qty 4

## 2015-01-29 MED ORDER — FUROSEMIDE 10 MG/ML IJ SOLN
40.0000 mg | Freq: Two times a day (BID) | INTRAMUSCULAR | Status: DC
  Administered 2015-01-30 – 2015-02-02 (×7): 40 mg via INTRAVENOUS
  Filled 2015-01-29 (×7): qty 4

## 2015-01-29 MED ORDER — TRIAMCINOLONE ACETONIDE 0.1 % EX CREA
1.0000 "application " | TOPICAL_CREAM | Freq: Every day | CUTANEOUS | Status: DC | PRN
  Filled 2015-01-29: qty 15

## 2015-01-29 MED ORDER — ALLOPURINOL 300 MG PO TABS
300.0000 mg | ORAL_TABLET | Freq: Every day | ORAL | Status: DC
  Administered 2015-01-30 – 2015-02-03 (×5): 300 mg via ORAL
  Filled 2015-01-29 (×5): qty 1

## 2015-01-29 MED ORDER — ACETAMINOPHEN 325 MG PO TABS: 650.0000 mg | ORAL_TABLET | Freq: Four times a day (QID) | ORAL | Status: DC | PRN

## 2015-01-29 MED ORDER — PANTOPRAZOLE SODIUM 40 MG PO TBEC
40.0000 mg | DELAYED_RELEASE_TABLET | Freq: Two times a day (BID) | ORAL | Status: DC
  Administered 2015-01-29 – 2015-02-03 (×10): 40 mg via ORAL
  Filled 2015-01-29 (×10): qty 1

## 2015-01-29 MED ORDER — ATENOLOL 50 MG PO TABS
50.0000 mg | ORAL_TABLET | Freq: Every day | ORAL | Status: DC
  Administered 2015-01-30: 50 mg via ORAL
  Filled 2015-01-29: qty 1

## 2015-01-29 MED ORDER — SODIUM CHLORIDE 0.9 % IJ SOLN
3.0000 mL | Freq: Two times a day (BID) | INTRAMUSCULAR | Status: DC
  Administered 2015-01-30 – 2015-02-03 (×10): 3 mL via INTRAVENOUS

## 2015-01-29 MED ORDER — POTASSIUM CHLORIDE CRYS ER 10 MEQ PO TBCR
10.0000 meq | EXTENDED_RELEASE_TABLET | Freq: Every day | ORAL | Status: DC
  Administered 2015-01-30 – 2015-02-03 (×5): 10 meq via ORAL
  Filled 2015-01-29 (×5): qty 1

## 2015-01-29 MED ORDER — FESOTERODINE FUMARATE ER 8 MG PO TB24
8.0000 mg | ORAL_TABLET | Freq: Every day | ORAL | Status: DC
  Administered 2015-01-30 – 2015-02-03 (×5): 8 mg via ORAL
  Filled 2015-01-29 (×5): qty 1

## 2015-01-29 MED ORDER — ONDANSETRON HCL 4 MG/2ML IJ SOLN: 4.0000 mg | Freq: Four times a day (QID) | INTRAMUSCULAR | Status: DC | PRN

## 2015-01-29 MED ORDER — ONDANSETRON HCL 4 MG PO TABS: 4.0000 mg | ORAL_TABLET | Freq: Four times a day (QID) | ORAL | Status: DC | PRN

## 2015-01-29 MED ORDER — ATORVASTATIN CALCIUM 10 MG PO TABS
10.0000 mg | ORAL_TABLET | Freq: Every day | ORAL | Status: DC
  Administered 2015-01-30 – 2015-02-03 (×5): 10 mg via ORAL
  Filled 2015-01-29 (×5): qty 1

## 2015-01-29 MED ORDER — ENOXAPARIN SODIUM 40 MG/0.4ML ~~LOC~~ SOLN
40.0000 mg | SUBCUTANEOUS | Status: DC
  Administered 2015-01-30 – 2015-02-03 (×5): 40 mg via SUBCUTANEOUS
  Filled 2015-01-29 (×5): qty 0.4

## 2015-01-29 MED ORDER — ACETAMINOPHEN 650 MG RE SUPP: 650.0000 mg | Freq: Four times a day (QID) | RECTAL | Status: DC | PRN

## 2015-01-29 MED ORDER — SERTRALINE HCL 50 MG PO TABS
50.0000 mg | ORAL_TABLET | Freq: Every day | ORAL | Status: DC
  Administered 2015-01-30 – 2015-02-03 (×5): 50 mg via ORAL
  Filled 2015-01-29 (×5): qty 1

## 2015-01-29 NOTE — ED Notes (Signed)
Pt presents to the ED from [facility]. Pts brother works for the [employer], pts brother checked his oxygen level and it was low 70's. Pt brother scheduled an appointment at MD office, where pt was low 70's as well. Pt is A/O per baseline. Pt has a history of MR, and lives home with mother.

## 2015-01-29 NOTE — ED Provider Notes (Signed)
CSN: [id number]     Arrival date & time 01/29/15  1704 History   First MD Initiated Contact with Patient 01/29/15 1712     Chief Complaint  Patient presents with  . Respiratory Distress     (Consider location/radiation/quality/duration/timing/severity/associated sxs/prior Treatment) The history is provided by the patient, the EMS personnel and a relative.  GARLAND HINCAPIE is a 54 y.o. male hx of diastolic CHF, DM, HTN here presenting with hypoxia, shortness of breath. Patient is a difficult historian and lives at home with mother. Had an appointment with his doctor 2 days ago and was noted to have a pulse ox in the 70s. Went back to see the doctor in the office today and pulse ox was still 70% and went to the fire station where his brother works and it was low 70s as well. Patient usually given medications by mother. Currently on lasix. Has been noted to be breathing faster than usual. Has worsening leg swelling as well.   Level V caveat- mental retardation   Past Medical History  Diagnosis Date  . Mental disorder   . Anxiety   . GERD (gastroesophageal reflux disease)   . Barrett esophagus   . Diabetes mellitus type 2, uncontrolled   . Hypertension   . Dyslipidemia    Past Surgical History  Procedure Laterality Date  . Appendectomy     No family history on file. Social History  Substance Use Topics  . Smoking status: Never Smoker   . Smokeless tobacco: Never Used  . Alcohol Use: No    Review of Systems  Respiratory: Positive for shortness of breath.   All other systems reviewed and are negative.     Allergies  Review of patient's allergies indicates no known allergies.  Home Medications   Prior to Admission medications   Medication Sig Start Date End Date Taking? Authorizing Provider  acetaminophen (TYLENOL) 500 MG tablet Take 500 mg by mouth every 6 (six) hours as needed for mild pain.    Historical Provider, MD  atenolol (TENORMIN) 50 MG tablet Take 50 mg by mouth  daily.    Historical Provider, MD  atorvastatin (LIPITOR) 10 MG tablet Take 10 mg by mouth daily.    Historical Provider, MD  fesoterodine (TOVIAZ) 8 MG TB24 tablet Take 8 mg by mouth daily.    Historical Provider, MD  furosemide (LASIX) 20 MG tablet Take 2 tablets (40 mg total) by mouth daily. 09/29/13   Modena Jansky, MD  metFORMIN (GLUCOPHAGE) 500 MG tablet Take 2 tablets (1,000 mg total) by mouth 2 (two) times daily with a meal. 09/29/13   Modena Jansky, MD  pantoprazole (PROTONIX) 40 MG tablet Take 40 mg by mouth 2 (two) times daily.    Historical Provider, MD  sertraline (ZOLOFT) 50 MG tablet Take 50 mg by mouth daily.    Historical Provider, MD  triamcinolone cream (KENALOG) 0.1 % Apply 1 application topically daily as needed (rash to back, face, neck, back of knees.).    Historical Provider, MD   BP 121/74 mmHg  Pulse 85  Temp(Src) 98.6 F (37 C) (Oral)  Resp 22  Wt 226 lb (102.513 kg)  SpO2 91% Physical Exam  Constitutional:  Chronically ill, slightly tachypneic   HENT:  Head: Normocephalic.  Mouth/Throat: Oropharynx is clear and moist.  Eyes: Conjunctivae are normal. Pupils are equal, round, and reactive to light.  Neck: Normal range of motion. Neck supple.  Cardiovascular: Normal rate, regular rhythm and normal heart sounds.  Pulmonary/Chest:  Slightly tachypneic, bibasilar crackles   Abdominal: Soft. Bowel sounds are normal.  Distended, nontender   Musculoskeletal: Normal range of motion. He exhibits no edema or tenderness.  Neurological:  Alert, difficult to understand. Moving all extremities   Skin: Skin is warm and dry.  Psychiatric:  Unable   Nursing note and vitals reviewed.   ED Course  Procedures (including critical care time) Labs Review Labs Reviewed  CBC WITH DIFFERENTIAL/PLATELET - Abnormal; Notable for the following:    RDW 18.1 (*)    Neutro Abs 8.0 (*)    All other components within normal limits  COMPREHENSIVE METABOLIC PANEL - Abnormal;  Notable for the following:    Chloride 90 (*)    CO2 33 (*)    Glucose, Bld 121 (*)    BUN 32 (*)    Creatinine, Ser 1.26 (*)    Albumin 3.0 (*)    All other components within normal limits  I-STAT VENOUS BLOOD GAS, ED - Abnormal; Notable for the following:    pH, Ven 7.484 (*)    pCO2, Ven 52.3 (*)    Bicarbonate 39.3 (*)    Acid-Base Excess 13.0 (*)    All other components within normal limits  I-STAT VENOUS BLOOD GAS, ED - Abnormal; Notable for the following:    pH, Ven 7.445 (*)    pCO2, Ven 58.6 (*)    pO2, Ven 27.0 (*)    Bicarbonate 40.3 (*)    Acid-Base Excess 13.0 (*)    All other components within normal limits  URINE CULTURE  URINALYSIS, ROUTINE W REFLEX MICROSCOPIC (NOT AT [facility])  BRAIN NATRIURETIC PEPTIDE  I-STAT TROPOININ, ED    Imaging Review Dg Chest Port 1 View  01/29/2015   CLINICAL DATA:  Shortness of breath.  Hypertension and diabetes  EXAM: PORTABLE CHEST 1 VIEW  COMPARISON:  09/26/2013  FINDINGS: There is moderate cardiac enlargement. Mild diffuse interstitial edema. No focal airspace consolidation. No pleural effusions noted. The visualized osseous structures are unremarkable.  IMPRESSION: 1. Cardiac enlargement and mild interstitial edema.   Electronically Signed   By: Kerby Moors M.D.   On: 01/29/2015 19:00   Dg Abd Portable 1v  01/29/2015   CLINICAL DATA:  Abdominal pain and distension  EXAM: PORTABLE ABDOMEN - 1 VIEW  COMPARISON:  March 09, 2013  FINDINGS: There is relative paucity of bowel gas. Pelvic phleboliths are identified.  IMPRESSION: Relative paucity of bowel gas, nonspecific.   Electronically Signed   By: Abelardo Diesel M.D.   On: 01/29/2015 18:59   I have personally reviewed and evaluated these images and lab results as part of my medical decision-making.   EKG Interpretation   Date/Time:  Wednesday January 29 2015 17:09:35 EDT Ventricular Rate:  86 PR Interval:  132 QRS Duration: 93 QT Interval:  392 QTC Calculation: 469 R Axis:    83 Text Interpretation:  Sinus rhythm Repol abnrm suggests ischemia, anterior  leads TWI new since previous Confirmed by YAO  MD, DAVID ([id number]) on  01/29/2015 5:12:17 PM      MDM   Final diagnoses:  None    CLERENCE GUBSER is a 54 y.o. male here with shortness of breath, hypoxia. Likely CHF exacerbation. On 2L, he is around 90%. Will get ABG, labs, CXR, BNP.  7:42 PM Tech tried multiple times to get ABG but got VBG instead. CO2 mid 64s. Has some chronic metabolic compensation. CXR showed pulm edema. Doesn't require bipap. Given lasix. Will admit for  CHF exacerbation.      Wandra Arthurs, MD 01/29/15 [phone]

## 2015-01-29 NOTE — Progress Notes (Signed)
Patient transferred from the ED via stretcher to room 3E20. Oriented patient to room equipment and to the Heart Failure floor. VSS. Currently patient complains of no pain or discomfort only short of breath when talking at times. Mother is at the bedside. Will continue to monitor patient to end of shift.

## 2015-01-29 NOTE — H&P (Signed)
[facility] History and Physical  Tony Jones:[id number] DOB: 12-30-1960 DOA: 01/29/2015  Referring physician: Dr. Darl Householder. PCP: Woody Seller, MD  Specialists: None.  Chief Complaint: Shortness of breath.  History obtained from patient's mother and brother.  HPI: Tony Jones is a 54 y.o. male with history of mental retardation, diabetes mellitus type 2, hypertension, hyperlipidemia was brought to the ER after patient was found to be hypoxic and short of breath. As per the family patient has been having these symptoms for last 3 days. Denies any fever chills chest pain or productive cough. In the ER patient was found to be hypoxic and requiring 4 L nasal cannula to maintain the oxygen saturation more than 90%. As per patient's mother patient has been compliant with his Lasix. Family noticed some lower extremity edema over the last few days. Chest x-ray in the ER shows pulmonary congestion and was given Lasix and admitted for CHF. Since patient's father's death in 11/04/2022 patient has been having poor appetite. But denies any abdominal pain. Abdomen appears benign. Patient also has been having some dysuria and was recently treated for UTI 3 weeks ago. UA is unremarkable today.  Review of Systems: As presented in the history of presenting illness, rest negative.  Past Medical History  Diagnosis Date  . Mental disorder   . Anxiety   . GERD (gastroesophageal reflux disease)   . Barrett esophagus   . Diabetes mellitus type 2, uncontrolled   . Hypertension   . Dyslipidemia    Past Surgical History  Procedure Laterality Date  . Appendectomy     Social History:  reports that he has never smoked. He has never used smokeless tobacco. He reports that he does not drink alcohol or use illicit drugs. Where does patient live home. Can patient participate in ADLs? No.  No Known Allergies  Family History:  Family History  Problem Relation Age of Onset  . Congestive Heart Failure Father        Prior to Admission medications   Medication Sig Start Date End Date Taking? Authorizing Provider  acetaminophen (TYLENOL) 500 MG tablet Take 500 mg by mouth every 6 (six) hours as needed for mild pain.   Yes Historical Provider, MD  allopurinol (ZYLOPRIM) 300 MG tablet Take 300 mg by mouth daily. 01/18/15  Yes Historical Provider, MD  atenolol (TENORMIN) 50 MG tablet Take 50 mg by mouth daily.   Yes Historical Provider, MD  atorvastatin (LIPITOR) 10 MG tablet Take 10 mg by mouth daily.   Yes Historical Provider, MD  fesoterodine (TOVIAZ) 8 MG TB24 tablet Take 8 mg by mouth daily.   Yes Historical Provider, MD  furosemide (LASIX) 20 MG tablet Take 2 tablets (40 mg total) by mouth daily. 09/29/13  Yes Modena Jansky, MD  metFORMIN (GLUCOPHAGE) 500 MG tablet Take 2 tablets (1,000 mg total) by mouth 2 (two) times daily with a meal. 09/29/13  Yes Modena Jansky, MD  pantoprazole (PROTONIX) 40 MG tablet Take 40 mg by mouth 2 (two) times daily.   Yes Historical Provider, MD  sertraline (ZOLOFT) 50 MG tablet Take 50 mg by mouth daily.   Yes Historical Provider, MD  triamcinolone cream (KENALOG) 0.1 % Apply 1 application topically daily as needed (rash to back, face, neck, back of knees.).   Yes Historical Provider, MD    Physical Exam: Filed Vitals:   01/29/15 1915 01/29/15 1941 01/29/15 2108 01/29/15 2116  BP: 121/74   116/74  Pulse: 85  89  Temp:    98.4 F (36.9 C)  TempSrc:    Oral  Resp: 22   20  Height:   5\' 1"  (1.549 m)   Weight:  102.513 kg (226 lb) 100.245 kg (221 lb)   SpO2: 91%   96%     General:  Obese not in distress.  Eyes: Anicteric. No pallor.  ENT: No discharge from the ears eyes nose or mouth.  Neck: No JVD appreciated. No mass felt.  Cardiovascular: S1 and S2 heard.  Respiratory: No rhonchi or crepitations.  Abdomen: Soft nontender bowel sounds present. Abdominal hernia present.  Skin: No rash.  Musculoskeletal: Mild edema.  Psychiatric: Patient has  history of mental retardation.  Neurologic: Alert awake oriented to person. Moves all extremities.  Labs on Admission:  Basic Metabolic Panel:  Recent Labs Lab 01/29/15 1729  NA 137  K 4.9  CL 90*  CO2 33*  GLUCOSE 121*  BUN 32*  CREATININE 1.26*  CALCIUM 9.6   Liver Function Tests:  Recent Labs Lab 01/29/15 1729  AST 39  ALT 32  ALKPHOS 79  BILITOT 0.9  PROT 6.7  ALBUMIN 3.0*   No results for input(s): LIPASE, AMYLASE in the last 168 hours. No results for input(s): AMMONIA in the last 168 hours. CBC:  Recent Labs Lab 01/29/15 1729  WBC 9.5  NEUTROABS 8.0*  HGB 14.0  HCT 45.7  MCV 85.3  PLT 156   Cardiac Enzymes: No results for input(s): CKTOTAL, CKMB, CKMBINDEX, TROPONINI in the last 168 hours.  BNP (last 3 results)  Recent Labs  01/29/15 1929  BNP 387.1*    ProBNP (last 3 results) No results for input(s): PROBNP in the last 8760 hours.  CBG: No results for input(s): GLUCAP in the last 168 hours.  Radiological Exams on Admission: Dg Chest Port 1 View  01/29/2015   CLINICAL DATA:  Shortness of breath.  Hypertension and diabetes  EXAM: PORTABLE CHEST 1 VIEW  COMPARISON:  09/26/2013  FINDINGS: There is moderate cardiac enlargement. Mild diffuse interstitial edema. No focal airspace consolidation. No pleural effusions noted. The visualized osseous structures are unremarkable.  IMPRESSION: 1. Cardiac enlargement and mild interstitial edema.   Electronically Signed   By: Kerby Moors M.D.   On: 01/29/2015 19:00   Dg Abd Portable 1v  01/29/2015   CLINICAL DATA:  Abdominal pain and distension  EXAM: PORTABLE ABDOMEN - 1 VIEW  COMPARISON:  March 09, 2013  FINDINGS: There is relative paucity of bowel gas. Pelvic phleboliths are identified.  IMPRESSION: Relative paucity of bowel gas, nonspecific.   Electronically Signed   By: Abelardo Diesel M.D.   On: 01/29/2015 18:59    EKG: Independently reviewed. Normal sinus rhythm with anterior lead T-wave  changes.  Assessment/Plan Principal Problem:   Acute diastolic CHF (congestive heart failure) Active Problems:   Mental disorder   Hypertension   Dyslipidemia   CHF (congestive heart failure)   Diabetes mellitus type 2, controlled   1. Acute on chronic diastolic CHF last EF measured was 55-60% in 2015 - at this time patient has been placed on Lasix 40 mg IV every 12 hourly. Closely follow intake output and metabolic panel. I have ordered repeat 2-D echo. Check d-dimer and troponins. If d-dimer is positive will get VQ scan and Dopplers of the lower extremity to rule out DVT since patient's creatinine is mildly elevated. 2. Diabetes mellitus type 2 controlled - will place patient on sliding scale coverage and hold metformin while  inpatient. 3. History of dysuria - was recently treated for UTI 3 weeks ago. Patient at times complains of some dysuria as per patient's brother. UA looks unremarkable and urine culture has been ordered. Patient is presently afebrile. 4. Hypertension - continue home medications. 5. Dyslipidemia on statins. 6. Chronic thrombocytopenia - follow CBC.  I have reviewed patient's old charts were labs. Personally reviewed patient's chest x-ray and EKG.   DVT Prophylaxis Lovenox.  Code Status: Full code. Previously patient had DO NOT RESUSCITATE but at this time family states for full code. This was confirmed with patient's brother and mother.  Family Communication: Patient's brother and mother.  Disposition Plan: Admit to inpatient.    KAKRAKANDY,ARSHAD N. [facility] Pager [phone].  If 7PM-7AM, please contact night-coverage [email or web address] Password [email or web address] 01/29/2015, 11:00 PM

## 2015-01-30 ENCOUNTER — Inpatient Hospital Stay (HOSPITAL_COMMUNITY): Payer: Medicare Other

## 2015-01-30 DIAGNOSIS — J9600 Acute respiratory failure, unspecified whether with hypoxia or hypercapnia: Secondary | ICD-10-CM

## 2015-01-30 DIAGNOSIS — R0600 Dyspnea, unspecified: Secondary | ICD-10-CM

## 2015-01-30 DIAGNOSIS — I5033 Acute on chronic diastolic (congestive) heart failure: Secondary | ICD-10-CM

## 2015-01-30 DIAGNOSIS — J9601 Acute respiratory failure with hypoxia: Secondary | ICD-10-CM

## 2015-01-30 DIAGNOSIS — I10 Essential (primary) hypertension: Secondary | ICD-10-CM

## 2015-01-30 LAB — BASIC METABOLIC PANEL
ANION GAP: 10 (ref 5–15)
BUN: 27 mg/dL — ABNORMAL HIGH (ref 6–20)
CHLORIDE: 91 mmol/L — AB (ref 101–111)
CO2: 39 mmol/L — ABNORMAL HIGH (ref 22–32)
CREATININE: 1.28 mg/dL — AB (ref 0.61–1.24)
Calcium: 8.8 mg/dL — ABNORMAL LOW (ref 8.9–10.3)
GFR calc non Af Amer: >60 mL/min (ref >60)
Glucose, Bld: 99 mg/dL (ref 65–99)
POTASSIUM: 3.6 mmol/L (ref 3.5–5.1)
SODIUM: 140 mmol/L (ref 135–145)

## 2015-01-30 LAB — CBC WITH DIFFERENTIAL/PLATELET
Basophils Absolute: 0 10*3/uL (ref 0.0–0.1)
Basophils Relative: 0 %
EOS ABS: 0.2 10*3/uL (ref 0.0–0.7)
Eosinophils Relative: 3 %
HCT: 44.2 % (ref 39.0–52.0)
HEMOGLOBIN: 13.4 g/dL (ref 13.0–17.0)
LYMPHS ABS: 0.9 10*3/uL (ref 0.7–4.0)
Lymphocytes Relative: 11 %
MCH: 26 pg (ref 26.0–34.0)
MCHC: 30.3 g/dL (ref 30.0–36.0)
MCV: 85.8 fL (ref 78.0–100.0)
Monocytes Absolute: 0.5 10*3/uL (ref 0.1–1.0)
Monocytes Relative: 6 %
NEUTROS PCT: 80 %
Neutro Abs: 6.5 10*3/uL (ref 1.7–7.7)
Platelets: 147 10*3/uL — ABNORMAL LOW (ref 150–400)
RBC: 5.15 MIL/uL (ref 4.22–5.81)
RDW: 18.2 % — ABNORMAL HIGH (ref 11.5–15.5)
WBC: 8.2 10*3/uL (ref 4.0–10.5)

## 2015-01-30 LAB — CBC
HEMATOCRIT: 44.8 % (ref 39.0–52.0)
Hemoglobin: 13.9 g/dL (ref 13.0–17.0)
MCH: 26.6 pg (ref 26.0–34.0)
MCHC: 31 g/dL (ref 30.0–36.0)
MCV: 85.7 fL (ref 78.0–100.0)
Platelets: 146 10*3/uL — ABNORMAL LOW (ref 150–400)
RBC: 5.23 MIL/uL (ref 4.22–5.81)
RDW: 18.4 % — AB (ref 11.5–15.5)
WBC: 9.4 10*3/uL (ref 4.0–10.5)

## 2015-01-30 LAB — CREATININE, SERUM
Creatinine, Ser: 1.27 mg/dL — ABNORMAL HIGH (ref 0.61–1.24)
GFR calc non Af Amer: >60 mL/min (ref >60)

## 2015-01-30 LAB — TROPONIN I
TROPONIN I: 0.05 ng/mL — AB (ref <0.031)
Troponin I: 0.04 ng/mL — ABNORMAL HIGH (ref <0.031)
Troponin I: 0.03 ng/mL (ref <0.031)

## 2015-01-30 LAB — TSH: TSH: 1.27 u[IU]/mL (ref 0.350–4.500)

## 2015-01-30 LAB — GLUCOSE, CAPILLARY
GLUCOSE-CAPILLARY: 158 mg/dL — AB (ref 65–99)
Glucose-Capillary: 127 mg/dL — ABNORMAL HIGH (ref 65–99)
Glucose-Capillary: 143 mg/dL — ABNORMAL HIGH (ref 65–99)
Glucose-Capillary: 154 mg/dL — ABNORMAL HIGH (ref 65–99)

## 2015-01-30 LAB — URINE CULTURE

## 2015-01-30 LAB — D-DIMER, QUANTITATIVE: D-Dimer, Quant: 0.92 ug/mL-FEU — ABNORMAL HIGH (ref 0.00–0.48)

## 2015-01-30 MED ORDER — CARVEDILOL 3.125 MG PO TABS
3.1250 mg | ORAL_TABLET | Freq: Two times a day (BID) | ORAL | Status: DC
  Administered 2015-01-31: 3.125 mg via ORAL
  Filled 2015-01-30: qty 1

## 2015-01-30 MED ORDER — TECHNETIUM TO 99M ALBUMIN AGGREGATED
3.0000 | Freq: Once | INTRAVENOUS | Status: AC | PRN
  Administered 2015-01-30: 3 via INTRAVENOUS

## 2015-01-30 MED ORDER — ASPIRIN 325 MG PO TABS
325.0000 mg | ORAL_TABLET | Freq: Every day | ORAL | Status: DC
  Administered 2015-01-30 – 2015-02-03 (×5): 325 mg via ORAL
  Filled 2015-01-30 (×5): qty 1

## 2015-01-30 MED ORDER — METFORMIN HCL 500 MG PO TABS: 1000.0000 mg | ORAL_TABLET | Freq: Two times a day (BID) | ORAL | Status: DC

## 2015-01-30 MED ORDER — PERFLUTREN LIPID MICROSPHERE
1.0000 mL | INTRAVENOUS | Status: AC | PRN
  Administered 2015-01-30: 2 mL via INTRAVENOUS
  Filled 2015-01-30: qty 10

## 2015-01-30 MED ORDER — INSULIN ASPART 100 UNIT/ML ~~LOC~~ SOLN
0.0000 [IU] | Freq: Three times a day (TID) | SUBCUTANEOUS | Status: DC
  Administered 2015-01-30: 1 [IU] via SUBCUTANEOUS
  Administered 2015-01-30: 2 [IU] via SUBCUTANEOUS
  Administered 2015-01-30: 1 [IU] via SUBCUTANEOUS
  Administered 2015-01-31: 2 [IU] via SUBCUTANEOUS
  Administered 2015-01-31 – 2015-02-01 (×2): 1 [IU] via SUBCUTANEOUS
  Administered 2015-02-01 (×2): 2 [IU] via SUBCUTANEOUS
  Administered 2015-02-02: 1 [IU] via SUBCUTANEOUS
  Administered 2015-02-02: 3 [IU] via SUBCUTANEOUS
  Administered 2015-02-02: 1 [IU] via SUBCUTANEOUS
  Administered 2015-02-03: 3 [IU] via SUBCUTANEOUS
  Administered 2015-02-03: 1 [IU] via SUBCUTANEOUS

## 2015-01-30 NOTE — Progress Notes (Signed)
  Echocardiogram 2D Echocardiogram has been performed.  Diamond Nickel 01/30/2015, 12:23 PM

## 2015-01-30 NOTE — Progress Notes (Signed)
PROGRESS NOTE  EDEL RIVERO VZC:[id number] DOB: 12/06/1960 DOA: 01/29/2015 PCP: Woody Seller, MD  Brief history 54 year old male with a history of mental retardation, diabetes mellitus type 2, hypertension, and Barrett's esophagus presented with 2-3 day history of worsening shortness of breath and lower extremity edema. The patient was noted to be hypoxic in the emergency department. He was placed on 4 L nasal cannula with improvement. There is no increased work of breathing. Chest x-ray showed pulmonary edema. Patient was started on intravenous furosemide with good clinical response. Assessment/Plan: Acute respiratory failure with hypoxia  -stable on 4 L -continue diuresis Acute on chronic diastolic CHF -Continue intravenous furosemide 40 mg IV twice a day -NEG `1.5 L since admission -NEG 3 pounds since admission -Daily weights -Monitor I's and O's -recheck echo Diabetes mellitus type 2 -Check hemoglobin A1c -NovoLog sliding scale -Discontinue metformin for now Hyperlipidemia -Continue statin Thrombocytopenia -Has been chronic -check B12 -check HIV Hypertension -d/c atenolol -start carvedilol Elevated D-Dimer -check VQ scan Family Communication:   Brother updated at beside Disposition Plan:   Home when medically stable       Procedures/Studies: Dg Chest Port 1 View  01/29/2015   CLINICAL DATA:  Shortness of breath.  Hypertension and diabetes  EXAM: PORTABLE CHEST 1 VIEW  COMPARISON:  09/26/2013  FINDINGS: There is moderate cardiac enlargement. Mild diffuse interstitial edema. No focal airspace consolidation. No pleural effusions noted. The visualized osseous structures are unremarkable.  IMPRESSION: 1. Cardiac enlargement and mild interstitial edema.   Electronically Signed   By: Kerby Moors M.D.   On: 01/29/2015 19:00   Dg Abd Portable 1v  01/29/2015   CLINICAL DATA:  Abdominal pain and distension  EXAM: PORTABLE ABDOMEN - 1 VIEW  COMPARISON:  March 09, 2013  FINDINGS: There is relative paucity of bowel gas. Pelvic phleboliths are identified.  IMPRESSION: Relative paucity of bowel gas, nonspecific.   Electronically Signed   By: Abelardo Diesel M.D.   On: 01/29/2015 18:59         Subjective: patient says that breathing is better. Denies any headache, chest pain, nausea, vomiting, diarrhea, belly pain. No dysuria or hematuria. No rashes.   Objective: Filed Vitals:   01/30/15 0527 01/30/15 0945 01/30/15 1213 01/30/15 1234  BP: 106/73 112/72 115/78 103/76  Pulse: 95 99  70  Temp: 98 F (36.7 C) 98 F (36.7 C)  98 F (36.7 C)  TempSrc: Oral Oral  Oral  Resp: 20 20  18   Height:      Weight: 99.066 kg (218 lb 6.4 oz)     SpO2: 93%   95%    Intake/Output Summary (Last 24 hours) at 01/30/15 1323 Last data filed at 01/30/15 0842  Gross per 24 hour  Intake    603 ml  Output   2050 ml  Net  -1447 ml   Weight change:  Exam:   General:  Pt is alert, follows commands appropriately, not in acute distress  HEENT: No icterus, No thrush, No neck mass, [place]/AT  Cardiovascular: RRR, S1/S2, no rubs, no gallops  Respiratory: bibasilar crackles. No wheezing   Abdomen: Soft/+BS, non tender, non distended, no guarding; no hepatosplenomegaly  Extremities: 2+ LE edema, No lymphangitis, No petechiae, No rashes, no synovitis;   SS or clubbing   Data Reviewed: Basic Metabolic Panel:  Recent Labs Lab 01/29/15 1729 01/29/15 2359 01/30/15 0519  NA 137  --  140  K 4.9  --  3.6  CL 90*  --  91*  CO2 33*  --  39*  GLUCOSE 121*  --  99  BUN 32*  --  27*  CREATININE 1.26* 1.27* 1.28*  CALCIUM 9.6  --  8.8*   Liver Function Tests:  Recent Labs Lab 01/29/15 1729  AST 39  ALT 32  ALKPHOS 79  BILITOT 0.9  PROT 6.7  ALBUMIN 3.0*   No results for input(s): LIPASE, AMYLASE in the last 168 hours. No results for input(s): AMMONIA in the last 168 hours. CBC:  Recent Labs Lab 01/29/15 1729 01/29/15 2359 01/30/15 0519  WBC 9.5  9.4 8.2  NEUTROABS 8.0*  --  6.5  HGB 14.0 13.9 13.4  HCT 45.7 44.8 44.2  MCV 85.3 85.7 85.8  PLT 156 146* 147*   Cardiac Enzymes:  Recent Labs Lab 01/29/15 2359 01/30/15 0519 01/30/15 1005  TROPONINI 0.04* 0.05* 0.03   BNP: Invalid input(s): POCBNP CBG:  Recent Labs Lab 01/29/15 2349 01/30/15 0616 01/30/15 1116  GLUCAP 158* 127* 143*    No results found for this or any previous visit (from the past 240 hour(s)).   Scheduled Meds: . allopurinol  300 mg Oral Daily  . aspirin  325 mg Oral Daily  . atenolol  50 mg Oral Daily  . atorvastatin  10 mg Oral Daily  . enoxaparin (LOVENOX) injection  40 mg Subcutaneous Q24H  . fesoterodine  8 mg Oral Daily  . furosemide  40 mg Intravenous Q12H  . insulin aspart  0-9 Units Subcutaneous TID WC  . pantoprazole  40 mg Oral BID  . potassium chloride  10 mEq Oral Daily  . sertraline  50 mg Oral Daily  . sodium chloride  3 mL Intravenous Q12H   Continuous Infusions:    TAT, DAVID, DO  [facility] Pager [phone]  If 7PM-7AM, please contact night-coverage [email or web address] Password [email or web address] 01/30/2015, 1:23 PM   LOS: 1 day

## 2015-01-31 ENCOUNTER — Inpatient Hospital Stay (HOSPITAL_COMMUNITY): Payer: Medicare Other

## 2015-01-31 DIAGNOSIS — M79606 Pain in leg, unspecified: Secondary | ICD-10-CM

## 2015-01-31 DIAGNOSIS — E785 Hyperlipidemia, unspecified: Secondary | ICD-10-CM

## 2015-01-31 DIAGNOSIS — F99 Mental disorder, not otherwise specified: Secondary | ICD-10-CM

## 2015-01-31 DIAGNOSIS — I5031 Acute diastolic (congestive) heart failure: Secondary | ICD-10-CM

## 2015-01-31 LAB — BASIC METABOLIC PANEL
Anion gap: 10 (ref 5–15)
BUN: 26 mg/dL — ABNORMAL HIGH (ref 6–20)
CALCIUM: 8.2 mg/dL — AB (ref 8.9–10.3)
CO2: 39 mmol/L — AB (ref 22–32)
CREATININE: 1.3 mg/dL — AB (ref 0.61–1.24)
Chloride: 89 mmol/L — ABNORMAL LOW (ref 101–111)
GFR calc non Af Amer: >60 mL/min (ref >60)
Glucose, Bld: 122 mg/dL — ABNORMAL HIGH (ref 65–99)
Potassium: 3.7 mmol/L (ref 3.5–5.1)
SODIUM: 138 mmol/L (ref 135–145)

## 2015-01-31 LAB — GLUCOSE, CAPILLARY
GLUCOSE-CAPILLARY: 126 mg/dL — AB (ref 65–99)
Glucose-Capillary: 117 mg/dL — ABNORMAL HIGH (ref 65–99)
Glucose-Capillary: 173 mg/dL — ABNORMAL HIGH (ref 65–99)
Glucose-Capillary: 199 mg/dL — ABNORMAL HIGH (ref 65–99)

## 2015-01-31 NOTE — Progress Notes (Signed)
VASCULAR LAB PRELIMINARY  PRELIMINARY  PRELIMINARY  PRELIMINARY  Bilateral lower extremity venous duplex completed.    Preliminary report:  Bilateral:  No evidence of DVT, superficial thrombosis, or Baker's Cyst.   SLAUGHTER, [place], RVS 01/31/2015, 3:00 PM

## 2015-01-31 NOTE — Plan of Care (Signed)
Problem: Phase I Progression Outcomes Goal: Dyspnea controlled at rest (HF) Outcome: Completed/Met Date Met:  01/31/15 No shortness of breath at rest today     

## 2015-01-31 NOTE — Progress Notes (Signed)
PROGRESS NOTE  Tony Jones SWN:[id number] DOB: 1960/12/26 DOA: 01/29/2015 PCP: Woody Seller, MD  Brief history 54 year old male with a history of mental retardation, diabetes mellitus type 2, hypertension, and Barrett's esophagus presented with 2-3 day history of worsening shortness of breath and lower extremity edema. The patient was noted to be hypoxic in the emergency department. He was placed on 4 L nasal cannula with improvement. There is no increased work of breathing. Chest x-ray showed pulmonary edema. Patient was started on intravenous furosemide with good clinical response. Assessment/Plan: Acute respiratory failure with hypoxia  -stable on 2.5L -continue diuresis -V/Q scan neg for PE Acute on chronic diastolic CHF -Continue intravenous furosemide 40 mg IV twice a day -Suspect reason for decompensation may be undiagnosed obstructive sleep apnea as well as dietary indiscretion -NEG 2.3 L since admission -NEG 3 pounds since admission -02/02/2015 echo EF 55-60%, mild RV dilatation, mild TR, mild RAE, no WMA -Monitor I's and O's -Still appears clinically volume overloaded Diabetes mellitus type 2 -Check hemoglobin A1c--pending -NovoLog sliding scale -Discontinue metformin for now Hyperlipidemia -Continue statin Thrombocytopenia -Has been chronic -check B12 -check HIV Hypertension -d/c atenolol -start carvedilol Elevated D-Dimer -check VQ scan--neg Family Communication: Mother updated at beside Disposition Plan: Home when medically stable    Procedures/Studies: Nm Pulmonary Perfusion  02/02/15   CLINICAL DATA:  Shortness of breath. History of diabetes, Barrett's esophagus and mental disorder.  EXAM: NUCLEAR MEDICINE PERFUSION LUNG SCAN  TECHNIQUE: Perfusion images were obtained in multiple projections after intravenous injection of radiopharmaceutical.  RADIOPHARMACEUTICALS:  3.0 MCi Tc-63m MAA IV. Patient unable to cooperate with ventilation study.   COMPARISON:  Portable chest yesterday.  FINDINGS: Both lungs appear well perfused without wedge-shaped defects to suggest pulmonary embolism. The heart remains mildly enlarged.  IMPRESSION: No evidence of pulmonary embolism on nuclear medicine perfusion study.   Electronically Signed   By: Richardean Sale M.D.   On: 02-02-2015 15:30   Dg Chest Port 1 View  01/29/2015   CLINICAL DATA:  Shortness of breath.  Hypertension and diabetes  EXAM: PORTABLE CHEST 1 VIEW  COMPARISON:  09/26/2013  FINDINGS: There is moderate cardiac enlargement. Mild diffuse interstitial edema. No focal airspace consolidation. No pleural effusions noted. The visualized osseous structures are unremarkable.  IMPRESSION: 1. Cardiac enlargement and mild interstitial edema.   Electronically Signed   By: Kerby Moors M.D.   On: 01/29/2015 19:00   Dg Abd Portable 1v  01/29/2015   CLINICAL DATA:  Abdominal pain and distension  EXAM: PORTABLE ABDOMEN - 1 VIEW  COMPARISON:  March 09, 2013  FINDINGS: There is relative paucity of bowel gas. Pelvic phleboliths are identified.  IMPRESSION: Relative paucity of bowel gas, nonspecific.   Electronically Signed   By: Abelardo Diesel M.D.   On: 01/29/2015 18:59         Subjective: Patient denies fevers, chills, headache, chest pain, dyspnea, nausea, vomiting, diarrhea, abdominal pain, dysuria, hematuria. Still has some dyspnea on exertion   Objective: Filed Vitals:   2015/02/02 2100 01/31/15 0502 01/31/15 0600 01/31/15 1219  BP: 104/69 97/71 105/70 100/72  Pulse: 66 69 69 67  Temp: 97.3 F (36.3 C) 98.3 F (36.8 C)  97.6 F (36.4 C)  TempSrc: Oral Oral  Oral  Resp: 18 21  20   Height:      Weight:  97.75 kg (215 lb 8 oz)    SpO2: 94% 92%  95%    Intake/Output Summary (  Last 24 hours) at 01/31/15 1431 Last data filed at 01/31/15 0600  Gross per 24 hour  Intake    320 ml  Output   1150 ml  Net   -830 ml   Weight change: -4.763 kg (-10 lb 8 oz) Exam:   General:  Pt is  alert, follows commands appropriately, not in acute distress  HEENT: No icterus, No thrush, No neck mass, [place]/AT  Cardiovascular: RRR, S1/S2, no rubs, no gallops  Respiratory: Bibasilar crackles. No wheeze  Abdomen: Soft/+BS, non tender, non distended, no guarding  Extremities: 2+LE edema, No lymphangitis, No petechiae, No rashes, no synovitis  Data Reviewed: Basic Metabolic Panel:  Recent Labs Lab 01/29/15 1729 01/29/15 2359 01/30/15 0519 01/31/15 0311  NA 137  --  140 138  K 4.9  --  3.6 3.7  CL 90*  --  91* 89*  CO2 33*  --  39* 39*  GLUCOSE 121*  --  99 122*  BUN 32*  --  27* 26*  CREATININE 1.26* 1.27* 1.28* 1.30*  CALCIUM 9.6  --  8.8* 8.2*   Liver Function Tests:  Recent Labs Lab 01/29/15 1729  AST 39  ALT 32  ALKPHOS 79  BILITOT 0.9  PROT 6.7  ALBUMIN 3.0*   No results for input(s): LIPASE, AMYLASE in the last 168 hours. No results for input(s): AMMONIA in the last 168 hours. CBC:  Recent Labs Lab 01/29/15 1729 01/29/15 2359 01/30/15 0519  WBC 9.5 9.4 8.2  NEUTROABS 8.0*  --  6.5  HGB 14.0 13.9 13.4  HCT 45.7 44.8 44.2  MCV 85.3 85.7 85.8  PLT 156 146* 147*   Cardiac Enzymes:  Recent Labs Lab 01/29/15 2359 01/30/15 0519 01/30/15 1005  TROPONINI 0.04* 0.05* 0.03   BNP: Invalid input(s): POCBNP CBG:  Recent Labs Lab 01/30/15 1116 01/30/15 1730 01/30/15 2112 01/31/15 0702 01/31/15 1120  GLUCAP 143* 154* 158* 117* 173*    Recent Results (from the past 240 hour(s))  Urine culture     Status: None   Collection Time: 01/29/15  5:16 PM  Result Value Ref Range Status   Specimen Description URINE, CLEAN CATCH  Final   Special Requests NONE  Final   Culture MULTIPLE SPECIES PRESENT, SUGGEST RECOLLECTION  Final   Report Status 01/30/2015 FINAL  Final     Scheduled Meds: . allopurinol  300 mg Oral Daily  . aspirin  325 mg Oral Daily  . atorvastatin  10 mg Oral Daily  . carvedilol  3.125 mg Oral BID WC  . enoxaparin (LOVENOX)  injection  40 mg Subcutaneous Q24H  . fesoterodine  8 mg Oral Daily  . furosemide  40 mg Intravenous Q12H  . insulin aspart  0-9 Units Subcutaneous TID WC  . pantoprazole  40 mg Oral BID  . potassium chloride  10 mEq Oral Daily  . sertraline  50 mg Oral Daily  . sodium chloride  3 mL Intravenous Q12H   Continuous Infusions:    TAT, DAVID, DO  [facility] Pager [phone]  If 7PM-7AM, please contact night-coverage [email or web address] Password [email or web address] 01/31/2015, 2:31 PM   LOS: 2 days

## 2015-01-31 NOTE — Plan of Care (Signed)
Problem: Phase I Progression Outcomes Goal: EF % per last Echo/documented,Core Reminder form on chart Outcome: Completed/Met Date Met:  01/31/15 EF 55-60%(01-30-15)     

## 2015-01-31 NOTE — Progress Notes (Signed)
Utilization Review Completed.Donne Anon T9/30/2016

## 2015-01-31 NOTE — Care Management Note (Signed)
Case Management Note  Patient Details  Name: Tony Jones MRN: [id number] Date of Birth: 12-09-1960  Subjective/Objective:      Admitted with CHF              Action/Plan: Patient lives at home with his mother and his brother assist in his care at times. NO DME, has insurance with Medicare and his mother stated that she does not have any problems getting his medication; pharmacy of choice is [facility]. He has scales at home and weigh himself daily. Patient could benefit from a Disease Management Program with CHF. His mother chose [place]. Tim with [facility] called for arrangements. Attending MD please enter the face to face form in Epic for [facility] services per Medicare guidelines.  Expected Discharge Date:     Possibly 02/02/2015             Expected Discharge Plan:  [place]  Discharge planning Services  CM Consult   Choice offered to:  Parent  HH Arranged:  Disease Management, RN [facility] Agency:  [facility]  Status of Service:   In progress  Sherrilyn Rist [phone] 01/31/2015, 4:40 PM

## 2015-01-31 NOTE — Progress Notes (Signed)
CSW referred to assist patient who was reported to be from a group home. Patient actually lives at home with his mother and is not interested in any type of placement.  CSW will sign off but will be available if needed in the future. He may benefit from home health services at d/c.  Lorie Phenix. Pauline Good, [place]

## 2015-02-01 LAB — GLUCOSE, CAPILLARY
GLUCOSE-CAPILLARY: 139 mg/dL — AB (ref 65–99)
GLUCOSE-CAPILLARY: 198 mg/dL — AB (ref 65–99)
Glucose-Capillary: 176 mg/dL — ABNORMAL HIGH (ref 65–99)
Glucose-Capillary: 144 mg/dL — ABNORMAL HIGH (ref 65–99)

## 2015-02-01 LAB — HEMOGLOBIN A1C
HEMOGLOBIN A1C: 6.7 % — AB (ref 4.8–5.6)
MEAN PLASMA GLUCOSE: 146 mg/dL

## 2015-02-01 NOTE — Progress Notes (Addendum)
PROGRESS NOTE  Tony Jones OAC:[id number] DOB: [birth year] DOA: 01/29/2015 PCP: Woody Seller, MD  Brief history 54 year old male with a history of mental retardation, diabetes mellitus type 2, hypertension, and Barrett's esophagus presented with 2-3 day history of worsening shortness of breath and lower extremity edema. The patient was noted to be hypoxic in the emergency department. He was placed on 4 L nasal cannula with improvement. There is no increased work of breathing. Chest x-ray showed pulmonary edema. Patient was started on intravenous furosemide with good clinical response. Assessment/Plan: Acute respiratory failure with hypoxia  -stable on 2L -continue diuresis -V/Q scan neg for PE Acute on chronic diastolic CHF -Continue intravenous furosemide 40 mg IV twice a day -Suspect reason for decompensation may be undiagnosed obstructive sleep apnea as well as dietary indiscretion -NEG 3 L since admission -NEG 4 pounds since admission -Feb 09, 2015 echo EF 55-60%, mild RV dilatation, mild TR, mild RAE, no WMA -Monitor I's and O's -Still appears clinically volume overloaded Diabetes mellitus type 2 -Check hemoglobin A1c--6.7 -NovoLog sliding scale -Discontinue metformin for now Hyperlipidemia -Continue statin Thrombocytopenia -Has been chronic -check B12 -check HIV--pending Hypertension -d/c atenolol -carvedilol held due to soft BPs Elevated D-Dimer -check VQ scan--neg Family Communication: Mother updated at beside Disposition Plan: Home when medically stable     Procedures/Studies: Nm Pulmonary Perfusion  Feb 09, 2015   CLINICAL DATA:  Shortness of breath. History of diabetes, Barrett's esophagus and mental disorder.  EXAM: NUCLEAR MEDICINE PERFUSION LUNG SCAN  TECHNIQUE: Perfusion images were obtained in multiple projections after intravenous injection of radiopharmaceutical.  RADIOPHARMACEUTICALS:  3.0 MCi Tc-52m MAA IV. Patient unable to cooperate with  ventilation study.  COMPARISON:  Portable chest yesterday.  FINDINGS: Both lungs appear well perfused without wedge-shaped defects to suggest pulmonary embolism. The heart remains mildly enlarged.  IMPRESSION: No evidence of pulmonary embolism on nuclear medicine perfusion study.   Electronically Signed   By: Richardean Sale M.D.   On: Feb 09, 2015 15:30   Dg Chest Port 1 View  01/29/2015   CLINICAL DATA:  Shortness of breath.  Hypertension and diabetes  EXAM: PORTABLE CHEST 1 VIEW  COMPARISON:  09/26/2013  FINDINGS: There is moderate cardiac enlargement. Mild diffuse interstitial edema. No focal airspace consolidation. No pleural effusions noted. The visualized osseous structures are unremarkable.  IMPRESSION: 1. Cardiac enlargement and mild interstitial edema.   Electronically Signed   By: Kerby Moors M.D.   On: 01/29/2015 19:00   Dg Abd Portable 1v  01/29/2015   CLINICAL DATA:  Abdominal pain and distension  EXAM: PORTABLE ABDOMEN - 1 VIEW  COMPARISON:  March 09, 2013  FINDINGS: There is relative paucity of bowel gas. Pelvic phleboliths are identified.  IMPRESSION: Relative paucity of bowel gas, nonspecific.   Electronically Signed   By: Abelardo Diesel M.D.   On: 01/29/2015 18:59         Subjective:  Patient denies fevers, chills, headache, chest pain, dyspnea, nausea, vomiting, diarrhea, abdominal pain, dysuria, hematuria  Objective: Filed Vitals:   02/01/15 0003 02/01/15 0500 02/01/15 0628 02/01/15 1242  BP: 93/55  123/82 104/66  Pulse: 88  86 90  Temp: 98.2 F (36.8 C)  98.3 F (36.8 C) 98.2 F (36.8 C)  TempSrc: Oral  Oral Oral  Resp: 18  18 18   Height:      Weight:  97.1 kg (214 lb 1.1 oz)    SpO2: 90%  96% 94%    Intake/Output Summary (Last  24 hours) at 02/01/15 1811 Last data filed at 02/01/15 1700  Gross per 24 hour  Intake   1043 ml  Output   2450 ml  Net  -1407 ml   Weight change: -0.65 kg (-1 lb 6.9 oz) Exam:   General:  Pt is alert, follows commands  appropriately, not in acute distress  HEENT: No icterus, No thrush, No neck mass, [place]/AT  Cardiovascular: RRR, S1/S2, no rubs, no gallops  Respiratory: Bibasilar crackles. No wheezing. Good air movement.  Abdomen: Soft/+BS, non tender, non distended, no guarding  Extremities: 1+LE edema, No lymphangitis, No petechiae, No rashes, no synovitis  Data Reviewed: Basic Metabolic Panel:  Recent Labs Lab 01/29/15 1729 01/29/15 2359 01/30/15 0519 01/31/15 0311  NA 137  --  140 138  K 4.9  --  3.6 3.7  CL 90*  --  91* 89*  CO2 33*  --  39* 39*  GLUCOSE 121*  --  99 122*  BUN 32*  --  27* 26*  CREATININE 1.26* 1.27* 1.28* 1.30*  CALCIUM 9.6  --  8.8* 8.2*   Liver Function Tests:  Recent Labs Lab 01/29/15 1729  AST 39  ALT 32  ALKPHOS 79  BILITOT 0.9  PROT 6.7  ALBUMIN 3.0*   No results for input(s): LIPASE, AMYLASE in the last 168 hours. No results for input(s): AMMONIA in the last 168 hours. CBC:  Recent Labs Lab 01/29/15 1729 01/29/15 2359 01/30/15 0519  WBC 9.5 9.4 8.2  NEUTROABS 8.0*  --  6.5  HGB 14.0 13.9 13.4  HCT 45.7 44.8 44.2  MCV 85.3 85.7 85.8  PLT 156 146* 147*   Cardiac Enzymes:  Recent Labs Lab 01/29/15 2359 01/30/15 0519 01/30/15 1005  TROPONINI 0.04* 0.05* 0.03   BNP: Invalid input(s): POCBNP CBG:  Recent Labs Lab 01/31/15 1629 01/31/15 2148 02/01/15 0622 02/01/15 1110 02/01/15 1614  GLUCAP 126* 199* 139* 198* 176*    Recent Results (from the past 240 hour(s))  Urine culture     Status: None   Collection Time: 01/29/15  5:16 PM  Result Value Ref Range Status   Specimen Description URINE, CLEAN CATCH  Final   Special Requests NONE  Final   Culture MULTIPLE SPECIES PRESENT, SUGGEST RECOLLECTION  Final   Report Status 01/30/2015 FINAL  Final     Scheduled Meds: . allopurinol  300 mg Oral Daily  . aspirin  325 mg Oral Daily  . atorvastatin  10 mg Oral Daily  . enoxaparin (LOVENOX) injection  40 mg Subcutaneous Q24H  .  fesoterodine  8 mg Oral Daily  . furosemide  40 mg Intravenous Q12H  . insulin aspart  0-9 Units Subcutaneous TID WC  . pantoprazole  40 mg Oral BID  . potassium chloride  10 mEq Oral Daily  . sertraline  50 mg Oral Daily  . sodium chloride  3 mL Intravenous Q12H   Continuous Infusions:    Doyt Castellana, DO  [facility] Pager [phone]  If 7PM-7AM, please contact night-coverage [email or web address] Password [email or web address] 02/01/2015, 6:11 PM   LOS: 3 days

## 2015-02-01 NOTE — Evaluation (Signed)
Physical Therapy Evaluation Patient Details Name: Tony Jones MRN: [id number] DOB: 1960-08-04 Today's Date: 02/01/2015   History of Present Illness  54 year old male with a history of mental retardation, diabetes mellitus type 2, hypertension, and Barrett's esophagus presented with 2-3 day history of worsening shortness of breath and lower extremity edema. admitted for Acute respiratory failure with hypoxia and Acute on chronic diastolic CHF  Clinical Impression  Pt admitted with the above diagnosis. Pt currently with functional limitations due to the deficits listed below (see PT Problem List). Pt ambulating with mild instability but no physical assist needed for correction, and did not require an assistive device. SpO2 on 1.5L at rest 92%, ambulating with 2L supplemental O2 at 93%, and ambulating on room air down to 82%. Good family support from family, and would benefit from HHPT to progress pts endurance, balance, and functional independence. Pt will benefit from skilled PT to increase their independence and safety with mobility to allow discharge to the venue listed below.       Follow Up Recommendations Home health PT;Supervision - Intermittent    Equipment Recommendations  None recommended by PT    Recommendations for Other Services       Precautions / Restrictions Precautions Precautions: Fall Precaution Comments: monitor O2 Restrictions Weight Bearing Restrictions: No      Mobility  Bed Mobility Overal bed mobility: Modified Independent             General bed mobility comments: extra time  Transfers Overall transfer level: Needs assistance Equipment used: None Transfers: Sit to/from Stand Sit to Stand: Supervision         General transfer comment: supervision for safety. Wide base of support with mild instability noted. Impulsively bent over to pick-up item off floor but did not require physical assist.  Ambulation/Gait Ambulation/Gait assistance: Min  guard Ambulation Distance (Feet): 100 Feet Assistive device: None Gait Pattern/deviations: Step-through pattern;Decreased stride length;Wide base of support Gait velocity: decreased Gait velocity interpretation: Below normal speed for age/gender General Gait Details: Slow with intermittent sway noted with gait, more pronounced with turns. Performed without a rolling walker. 2/4 dyspnea with gait training. VC for pursed lip breathing and energy conservation techniques. Min guard for safety. SpO2 down to 82% on room air, returns to 93% with 2L supplemental O2 application.  Stairs            Wheelchair Mobility    Modified Rankin (Stroke Patients Only)       Balance Overall balance assessment: Needs assistance Sitting-balance support: No upper extremity supported;Feet supported Sitting balance-Leahy Scale: Good     Standing balance support: No upper extremity supported Standing balance-Leahy Scale: Fair                               Pertinent Vitals/Pain Pain Assessment: No/denies pain    Home Living Family/patient expects to be discharged to:: Private residence Living Arrangements: Parent Available Help at Discharge: Family;Available 24 hours/day Type of Home: House Home Access: Level entry     Home Layout: One level Home Equipment: Cane - single point;Walker - 4 wheels;Wheelchair - manual;Bedside commode      Prior Function Level of Independence: Needs assistance   Gait / Transfers Assistance Needed: no device to ambulate  ADL's / Homemaking Assistance Needed: Mother assists with bathroom needs bath/ and socks and shoes at times        Hand Dominance  Extremity/Trunk Assessment   Upper Extremity Assessment: Defer to OT evaluation           Lower Extremity Assessment: Generalized weakness         Communication   Communication: No difficulties  Cognition Arousal/Alertness: Awake/alert Behavior During Therapy: WFL for tasks  assessed/performed Overall Cognitive Status: History of cognitive impairments - at baseline                      General Comments General comments (skin integrity, edema, etc.): At rest on 1.5 L supplemtnal O2 92%, Ambulating on room air 82%, Ambulating on 2L supplemental O2 - 93%    Exercises        Assessment/Plan    PT Assessment Patient needs continued PT services  PT Diagnosis Abnormality of gait;Generalized weakness   PT Problem List Decreased strength;Decreased activity tolerance;Decreased balance;Decreased mobility;Cardiopulmonary status limiting activity;Decreased knowledge of use of DME;Decreased cognition;Obesity  PT Treatment Interventions DME instruction;Gait training;Functional mobility training;Therapeutic activities;Therapeutic exercise;Balance training;Patient/family education   PT Goals (Current goals can be found in the Care Plan section) Acute Rehab PT Goals Patient Stated Goal: none stated PT Goal Formulation: With patient/family Time For Goal Achievement: 02/15/15 Potential to Achieve Goals: Good    Frequency Min 3X/week   Barriers to discharge        Co-evaluation               End of Session Equipment Utilized During Treatment: Oxygen Activity Tolerance: Patient tolerated treatment well Patient left: in chair;with call bell/phone within reach;with family/visitor present Nurse Communication: Mobility status;Other (comment) (O2 sats)         Time: 1206-1227 PT Time Calculation (min) (ACUTE ONLY): 21 min   Charges:   PT Evaluation $Initial PT Evaluation Tier I: 1 Procedure     PT G CodesEllouise Jones 02/01/2015, 1:12 PM Tony Jones, Tony Jones

## 2015-02-02 ENCOUNTER — Inpatient Hospital Stay (HOSPITAL_COMMUNITY): Payer: Medicare Other

## 2015-02-02 DIAGNOSIS — D696 Thrombocytopenia, unspecified: Secondary | ICD-10-CM

## 2015-02-02 LAB — BASIC METABOLIC PANEL
ANION GAP: 9 (ref 5–15)
BUN: 24 mg/dL — ABNORMAL HIGH (ref 6–20)
CALCIUM: 8.3 mg/dL — AB (ref 8.9–10.3)
CHLORIDE: 91 mmol/L — AB (ref 101–111)
CO2: 37 mmol/L — AB (ref 22–32)
Creatinine, Ser: 1.18 mg/dL (ref 0.61–1.24)
GFR calc non Af Amer: >60 mL/min (ref >60)
Glucose, Bld: 140 mg/dL — ABNORMAL HIGH (ref 65–99)
POTASSIUM: 3.7 mmol/L (ref 3.5–5.1)
Sodium: 137 mmol/L (ref 135–145)

## 2015-02-02 LAB — GLUCOSE, CAPILLARY
GLUCOSE-CAPILLARY: 133 mg/dL — AB (ref 65–99)
GLUCOSE-CAPILLARY: 141 mg/dL — AB (ref 65–99)
Glucose-Capillary: 205 mg/dL — ABNORMAL HIGH (ref 65–99)
Glucose-Capillary: 186 mg/dL — ABNORMAL HIGH (ref 65–99)

## 2015-02-02 LAB — VITAMIN B12: Vitamin B-12: 260 pg/mL (ref 180–914)

## 2015-02-02 MED ORDER — BISACODYL 10 MG RE SUPP
10.0000 mg | Freq: Once | RECTAL | Status: AC
  Administered 2015-02-02: 10 mg via RECTAL
  Filled 2015-02-02: qty 1

## 2015-02-02 MED ORDER — FUROSEMIDE 10 MG/ML IJ SOLN
80.0000 mg | Freq: Two times a day (BID) | INTRAMUSCULAR | Status: DC
  Administered 2015-02-02 – 2015-02-03 (×2): 80 mg via INTRAVENOUS
  Filled 2015-02-02 (×2): qty 8

## 2015-02-02 MED ORDER — SENNA 8.6 MG PO TABS: 2.0000 | ORAL_TABLET | Freq: Every day | ORAL | Status: DC

## 2015-02-02 MED ORDER — DOCUSATE SODIUM 100 MG PO CAPS
100.0000 mg | ORAL_CAPSULE | Freq: Two times a day (BID) | ORAL | Status: DC
Start: 2015-02-02 — End: 2015-02-03
  Filled 2015-02-02: qty 1

## 2015-02-02 MED ORDER — FUROSEMIDE 10 MG/ML IJ SOLN
40.0000 mg | Freq: Once | INTRAMUSCULAR | Status: AC
  Administered 2015-02-02: 40 mg via INTRAVENOUS
  Filled 2015-02-02: qty 4

## 2015-02-02 NOTE — Progress Notes (Signed)
SATURATION QUALIFICATIONS: (This note is used to comply with regulatory documentation for home oxygen)  Patient Saturations on Room Air at Rest 90%  Patient Saturations on Room Air while Ambulating =85%  Patient Saturations on 2 Liters of oxygen while Ambulating = 93%  Please briefly explain why patient needs home oxygen: oxygen saturation drop  While ambulating. See above documentation.

## 2015-02-02 NOTE — Progress Notes (Signed)
PROGRESS NOTE  HAI GRABE HEN:[id number] DOB: [birth year] DOA: 01/29/2015 PCP: Woody Seller, MD   Brief history 54 year old male with a history of mental retardation, diabetes mellitus type 2, hypertension, and Barrett's esophagus presented with 2-3 day history of worsening shortness of breath and lower extremity edema. The patient was noted to be hypoxic in the emergency department. He was placed on 4 L nasal cannula with improvement. There is no increased work of breathing. Chest x-ray showed pulmonary edema. Patient was started on intravenous furosemide with good clinical response. Assessment/Plan: Acute respiratory failure with hypoxia  -stable on 2L -continue diuresis -V/Q scan neg for PE -02/02/15--desaturated with ambulation Acute on chronic diastolic CHF -Continue intravenous furosemide 40 mg IV twice a day -Suspect reason for decompensation may be undiagnosed obstructive sleep apnea as well as dietary indiscretion -NEG 4.4 L since admission -NEG 7 pounds since admission -01/30/2015 echo EF 55-60%, mild RV dilatation, mild TR, mild RAE, no WMA -Monitor I's and O's -Improving, but still appears clinically volume overloaded -02/02/2015--chest x-ray--improving pulmonary edema Diabetes mellitus type 2 -Check hemoglobin A1c--6.7 -NovoLog sliding scale -Discontinue metformin for now Hyperlipidemia -Continue statin Thrombocytopenia -Has been chronic -check B12--260-->start B12 supplementation on 10/02 -check HIV--pending Hypertension -d/c atenolol-->switched to carvedilol -carvedilol held due to soft BPs Elevated D-Dimer -check VQ scan--neg Constipation -start cathartics  Family Communication: Mother updated at beside Disposition Plan: Home when medically stable   Procedures/Studies: Dg Chest 2 View  02/02/2015   CLINICAL DATA:  Shortness of breath and CHF.  EXAM: CHEST  2 VIEW  COMPARISON:  01/29/2015 and prior chest radiographs  FINDINGS:  Cardiomegaly again noted.  Decreased pulmonary edema is noted.  There is no evidence of pneumothorax or large pleural effusion.  No other changes identified.  IMPRESSION: Cardiomegaly with decreased pulmonary edema.   Electronically Signed   By: Margarette Canada M.D.   On: 02/02/2015 10:15   Nm Pulmonary Perfusion  01/30/2015   CLINICAL DATA:  Shortness of breath. History of diabetes, Barrett's esophagus and mental disorder.  EXAM: NUCLEAR MEDICINE PERFUSION LUNG SCAN  TECHNIQUE: Perfusion images were obtained in multiple projections after intravenous injection of radiopharmaceutical.  RADIOPHARMACEUTICALS:  3.0 MCi Tc-43m MAA IV. Patient unable to cooperate with ventilation study.  COMPARISON:  Portable chest yesterday.  FINDINGS: Both lungs appear well perfused without wedge-shaped defects to suggest pulmonary embolism. The heart remains mildly enlarged.  IMPRESSION: No evidence of pulmonary embolism on nuclear medicine perfusion study.   Electronically Signed   By: Richardean Sale M.D.   On: 01/30/2015 15:30   Dg Chest Port 1 View  01/29/2015   CLINICAL DATA:  Shortness of breath.  Hypertension and diabetes  EXAM: PORTABLE CHEST 1 VIEW  COMPARISON:  09/26/2013  FINDINGS: There is moderate cardiac enlargement. Mild diffuse interstitial edema. No focal airspace consolidation. No pleural effusions noted. The visualized osseous structures are unremarkable.  IMPRESSION: 1. Cardiac enlargement and mild interstitial edema.   Electronically Signed   By: Kerby Moors M.D.   On: 01/29/2015 19:00   Dg Abd Portable 1v  01/29/2015   CLINICAL DATA:  Abdominal pain and distension  EXAM: PORTABLE ABDOMEN - 1 VIEW  COMPARISON:  March 09, 2013  FINDINGS: There is relative paucity of bowel gas. Pelvic phleboliths are identified.  IMPRESSION: Relative paucity of bowel gas, nonspecific.   Electronically Signed   By: Abelardo Diesel M.D.   On: 01/29/2015 18:59  Subjective: Patient still has some dyspnea on  exertion but breathing is better. Denies any fevers, chills, chest pain, shortness breath, nausea, vomiting, diarrhea, abdominal pain. Feels constipated. No hematochezia or melena. Denies any headache or dizziness.  Objective: Filed Vitals:   02/01/15 2053 02/02/15 0523 02/02/15 0929 02/02/15 1400  BP: 101/63 118/63 123/71 107/57  Pulse: 93 86 93 92  Temp: 97.5 F (36.4 C) 98.9 F (37.2 C) 97.6 F (36.4 C) 98 F (36.7 C)  TempSrc: Oral Oral Oral Oral  Resp: 18 20 20 20   Height:      Weight:  96.072 kg (211 lb 12.8 oz)    SpO2: 92% 93% 93% 97%    Intake/Output Summary (Last 24 hours) at 02/02/15 1730 Last data filed at 02/02/15 1300  Gross per 24 hour  Intake    580 ml  Output   2075 ml  Net  -1495 ml   Weight change: -1.028 kg (-2 lb 4.3 oz) Exam:   General:  Pt is alert, follows commands appropriately, not in acute distress  HEENT: No icterus, No thrush, No neck mass, [place]/AT  Cardiovascular: RRR, S1/S2, no rubs, no gallops  Respiratory: Fine bibasilar crackles. No wheezing  Abdomen: Soft/+BS, non tender, non distended, no guarding; no hepatosplenomegaly  Extremities: 1+LE edema, No lymphangitis, No petechiae, No rashes, no synovitis; no cyanosis or clubbing  Data Reviewed: Basic Metabolic Panel:  Recent Labs Lab 01/29/15 1729 01/29/15 2359 01/30/15 0519 01/31/15 0311 02/02/15 0310  NA 137  --  140 138 137  K 4.9  --  3.6 3.7 3.7  CL 90*  --  91* 89* 91*  CO2 33*  --  39* 39* 37*  GLUCOSE 121*  --  99 122* 140*  BUN 32*  --  27* 26* 24*  CREATININE 1.26* 1.27* 1.28* 1.30* 1.18  CALCIUM 9.6  --  8.8* 8.2* 8.3*   Liver Function Tests:  Recent Labs Lab 01/29/15 1729  AST 39  ALT 32  ALKPHOS 79  BILITOT 0.9  PROT 6.7  ALBUMIN 3.0*   No results for input(s): LIPASE, AMYLASE in the last 168 hours. No results for input(s): AMMONIA in the last 168 hours. CBC:  Recent Labs Lab 01/29/15 1729 01/29/15 2359 01/30/15 0519  WBC 9.5 9.4 8.2    NEUTROABS 8.0*  --  6.5  HGB 14.0 13.9 13.4  HCT 45.7 44.8 44.2  MCV 85.3 85.7 85.8  PLT 156 146* 147*   Cardiac Enzymes:  Recent Labs Lab 01/29/15 2359 01/30/15 0519 01/30/15 1005  TROPONINI 0.04* 0.05* 0.03   BNP: Invalid input(s): POCBNP CBG:  Recent Labs Lab 02/01/15 1614 02/01/15 2109 02/02/15 0549 02/02/15 1110 02/02/15 1627  GLUCAP 176* 144* 133* 205* 141*    Recent Results (from the past 240 hour(s))  Urine culture     Status: None   Collection Time: 01/29/15  5:16 PM  Result Value Ref Range Status   Specimen Description URINE, CLEAN CATCH  Final   Special Requests NONE  Final   Culture MULTIPLE SPECIES PRESENT, SUGGEST RECOLLECTION  Final   Report Status 01/30/2015 FINAL  Final     Scheduled Meds: . allopurinol  300 mg Oral Daily  . aspirin  325 mg Oral Daily  . atorvastatin  10 mg Oral Daily  . docusate sodium  100 mg Oral BID  . enoxaparin (LOVENOX) injection  40 mg Subcutaneous Q24H  . fesoterodine  8 mg Oral Daily  . furosemide  80 mg Intravenous BID  .  insulin aspart  0-9 Units Subcutaneous TID WC  . pantoprazole  40 mg Oral BID  . potassium chloride  10 mEq Oral Daily  . sertraline  50 mg Oral Daily  . sodium chloride  3 mL Intravenous Q12H   Continuous Infusions:    Nyair Depaulo, DO  [facility] Pager [phone]  If 7PM-7AM, please contact night-coverage [email or web address] Password [email or web address] 02/02/2015, 5:30 PM   LOS: 4 days

## 2015-02-02 NOTE — Progress Notes (Signed)
Physical Therapy Treatment Patient Details Name: Tony Jones MRN: [id number] DOB: November 05, 1960 Today's Date: 02/02/2015    History of Present Illness 54 year old male with a history of mental retardation, diabetes mellitus type 2, hypertension, and Barrett's esophagus presented with 2-3 day history of worsening shortness of breath and lower extremity edema. admitted for Acute respiratory failure with hypoxia and Acute on chronic diastolic CHF    PT Comments    Continue to recommend supplemental O2 with ambulation; Noting progress in activity tolerance  Follow Up Recommendations  Home health PT;Supervision - Intermittent     Equipment Recommendations  None recommended by PT    Recommendations for Other Services OT consult     Precautions / Restrictions Precautions Precautions: Fall Precaution Comments: monitor O2 Restrictions Weight Bearing Restrictions: No    Mobility  Bed Mobility                  Transfers Overall transfer level: Needs assistance Equipment used: None   Sit to Stand: Supervision         General transfer comment: supervision for safety. Wide base of support with mild instability noted.  Ambulation/Gait Ambulation/Gait assistance: Min guard (without physical contact) Ambulation Distance (Feet): 150 Feet Assistive device:  (pushing dinamap) Gait Pattern/deviations: Step-through pattern;Decreased stride length;Wide base of support Gait velocity: decreased   General Gait Details: Slow with intermittent sway noted with gait, more pronounced with turns. Performed without a rolling walker, but had UE support form pushing Dinamap; 2/4 dyspnea with gait training. VC for pursed lip breathing and energy conservation techniques. Min guard for safety.    Stairs            Wheelchair Mobility    Modified Rankin (Stroke Patients Only)       Balance     Sitting balance-Leahy Scale: Good       Standing balance-Leahy Scale: Fair                       Cognition Arousal/Alertness: Awake/alert Behavior During Therapy: WFL for tasks assessed/performed Overall Cognitive Status: History of cognitive impairments - at baseline                      Exercises      General Comments General comments (skin integrity, edema, etc.): O2 sats renged 90-97% with amb on 2 L supplemental O2      Pertinent Vitals/Pain      Home Living                      Prior Function            PT Goals (current goals can now be found in the care plan section) Acute Rehab PT Goals Patient Stated Goal: none stated PT Goal Formulation: With patient/family Time For Goal Achievement: 02/15/15 Potential to Achieve Goals: Good Progress towards PT goals: Progressing toward goals    Frequency  Min 3X/week    PT Plan Current plan remains appropriate    Co-evaluation             End of Session Equipment Utilized During Treatment: Oxygen Activity Tolerance: Patient tolerated treatment well Patient left: in chair;with call bell/phone within reach;with family/visitor present     Time: 0938-1829 PT Time Calculation (min) (ACUTE ONLY): 15 min  Charges:  $Gait Training: 8-22 mins                    G  Codes:      Roney Marion [facility] 02/02/2015, 5:47 PM  Roney Marion, [place] Pager [phone] Office [phone]

## 2015-02-03 LAB — BASIC METABOLIC PANEL
Anion gap: 8 (ref 5–15)
BUN: 20 mg/dL (ref 6–20)
CALCIUM: 8.2 mg/dL — AB (ref 8.9–10.3)
CO2: 36 mmol/L — ABNORMAL HIGH (ref 22–32)
CREATININE: 1.21 mg/dL (ref 0.61–1.24)
Chloride: 92 mmol/L — ABNORMAL LOW (ref 101–111)
GFR calc Af Amer: >60 mL/min (ref >60)
GLUCOSE: 133 mg/dL — AB (ref 65–99)
POTASSIUM: 3.4 mmol/L — AB (ref 3.5–5.1)
SODIUM: 136 mmol/L (ref 135–145)

## 2015-02-03 LAB — GLUCOSE, CAPILLARY
GLUCOSE-CAPILLARY: 142 mg/dL — AB (ref 65–99)
Glucose-Capillary: 250 mg/dL — ABNORMAL HIGH (ref 65–99)

## 2015-02-03 MED ORDER — VITAMIN B-12 500 MCG PO TABS: 1000.0000 ug | ORAL_TABLET | Freq: Every day | ORAL | Status: DC

## 2015-02-03 MED ORDER — CARVEDILOL 3.125 MG PO TABS: 3.1250 mg | ORAL_TABLET | Freq: Two times a day (BID) | ORAL | Status: DC

## 2015-02-03 MED ORDER — FUROSEMIDE 40 MG PO TABS: 40.0000 mg | ORAL_TABLET | Freq: Two times a day (BID) | ORAL | Status: DC

## 2015-02-03 MED ORDER — POTASSIUM CHLORIDE CRYS ER 20 MEQ PO TBCR: 20.0000 meq | EXTENDED_RELEASE_TABLET | Freq: Every day | ORAL | Status: DC

## 2015-02-03 MED ORDER — POTASSIUM CHLORIDE CRYS ER 20 MEQ PO TBCR
20.0000 meq | EXTENDED_RELEASE_TABLET | Freq: Once | ORAL | Status: AC
  Administered 2015-02-03: 20 meq via ORAL
  Filled 2015-02-03: qty 1

## 2015-02-03 NOTE — Discharge Summary (Addendum)
Physician Discharge Summary  Tony Jones EGB:[id number] DOB: [birth year] DOA: 01/29/2015  PCP: Tony Seller, MD  Admit date: 01/29/2015 Discharge date: 02/03/2015  Recommendations for Outpatient Follow-up:  1. Pt will need to follow up with PCP in 1 weeks post discharge 2. Please obtain BMP in one week 3. Recheck B12 in one month  Discharge Diagnoses:  Acute respiratory failure with hypoxia  -stable on 2L -continue diuresis -V/Q scan neg for PE -02/02/15--desaturated with ambulation-->set up home oxygen Acute on chronic diastolic CHF -Continue intravenous furosemide during the hospitalization -Suspect reason for decompensation may be undiagnosed obstructive sleep apnea as well as dietary indiscretion -NEG 5.1 L since admission -NEG 7 pounds since admission -01/30/2015 echo EF 55-60%, mild RV dilatation, mild TR, mild RAE, no WMA -Monitor I's and O's -Improving, but still appears clinically volume overloaded -02/02/2015--chest x-ray--improving pulmonary edema -I discussed with the patient's mother that the patient may benefit from an additional day of intravenous diuresis -I had a long discussion with the patient's mother at the bedside. She was adamant that it was in the patient's best interest given his mental condition that he be discharged today rather than staying another day for continued diuresis. -The patient will go home with furosemide 40 mg twice a day.  I stressed the importance of follow-up with his primary care provider within one week and to keep a log of daily weights. -home with carvedilol -ACE not added due to soft BPs Diabetes mellitus type 2 -Check hemoglobin A1c--6.7 -NovoLog sliding scale -restart metformin after d/c Hyperlipidemia -Continue statin Thrombocytopenia -Has been chronic -check B12--260-->start B12 supplementation  -check HIV--pending Hypertension -d/c atenolol-->switched to carvedilol 3.125 mg bid Elevated D-Dimer -check VQ  scan--neg Constipation -start cathartics-->+BM  Discharge Condition: stable  Disposition: home  Diet:heart healthy Wt Readings from Last 3 Encounters:  02/03/15 96.163 kg (212 lb)  09/29/13 104.6 kg (230 lb 9.6 oz)  12/03/11 99.791 kg (220 lb)    History of present illness:  54 year old male with a history of mental retardation, diabetes mellitus type 2, hypertension, and Barrett's esophagus presented with 2-3 day history of worsening shortness of breath and lower extremity edema. The patient was noted to be hypoxic in the emergency department. He was placed on 4 L nasal cannula with improvement. There is no increased work of breathing. Chest x-ray showed pulmonary edema. Patient was started on intravenous furosemide with good clinical response. The patient is oxygen demand continued to decrease throughout the hospitalization. At the time of discharge, he still required 2 L nasal cannula. Ambulation on room air did show he desaturated. Home health physical therapy and home oxygen was set up with assistance of case management.  Discharge Exam: Filed Vitals:   02/03/15 1118  BP: 102/69  Pulse: 96  Temp:   Resp: 20   Filed Vitals:   02/02/15 1400 02/02/15 2042 02/03/15 0525 02/03/15 1118  BP: 107/57 104/58 111/54 102/69  Pulse: 92 89 98 96  Temp: 98 F (36.7 C) 97.8 F (36.6 C) 98.1 F (36.7 C)   TempSrc: Oral Oral Oral   Resp: 20 20 22 20   Height:      Weight:   96.163 kg (212 lb)   SpO2: 97% 93% 94% 92%   General: Awake and alert, NAD, pleasant, cooperative Cardiovascular: RRR, no rub, no gallop, no S3 Respiratory: Fine bibasilar crackles. No wheeze. Abdomen:soft, nontender, nondistended, positive bowel sounds Extremities: 1+ LE edema, No lymphangitis, no petechiae  Discharge Instructions      Discharge Instructions  Diet - low sodium heart healthy    Complete by:  As directed      Increase activity slowly    Complete by:  As directed             Medication  List    STOP taking these medications        atenolol 50 MG tablet  Commonly known as:  TENORMIN      TAKE these medications        acetaminophen 500 MG tablet  Commonly known as:  TYLENOL  Take 500 mg by mouth every 6 (six) hours as needed for mild pain.     allopurinol 300 MG tablet  Commonly known as:  ZYLOPRIM  Take 300 mg by mouth daily.     atorvastatin 10 MG tablet  Commonly known as:  LIPITOR  Take 10 mg by mouth daily.     carvedilol 3.125 MG tablet  Commonly known as:  COREG  Take 1 tablet (3.125 mg total) by mouth 2 (two) times daily with a meal.     furosemide 40 MG tablet  Commonly known as:  LASIX  Take 1 tablet (40 mg total) by mouth 2 (two) times daily.     metFORMIN 500 MG tablet  Commonly known as:  GLUCOPHAGE  Take 2 tablets (1,000 mg total) by mouth 2 (two) times daily with a meal.     pantoprazole 40 MG tablet  Commonly known as:  PROTONIX  Take 40 mg by mouth 2 (two) times daily.     potassium chloride SA 20 MEQ tablet  Commonly known as:  K-DUR,KLOR-CON  Take 1 tablet (20 mEq total) by mouth daily.     sertraline 50 MG tablet  Commonly known as:  ZOLOFT  Take 50 mg by mouth daily.     TOVIAZ 8 MG Tb24 tablet  Generic drug:  fesoterodine  Take 8 mg by mouth daily.     triamcinolone cream 0.1 %  Commonly known as:  KENALOG  Apply 1 application topically daily as needed (rash to back, face, neck, back of knees.).     vitamin B-12 500 MCG tablet  Commonly known as:  CYANOCOBALAMIN  Take 2 tablets (1,000 mcg total) by mouth daily.         The results of significant diagnostics from this hospitalization (including imaging, microbiology, ancillary and laboratory) are listed below for reference.    Significant Diagnostic Studies: Dg Chest 2 View  02/02/2015   CLINICAL DATA:  Shortness of breath and CHF.  EXAM: CHEST  2 VIEW  COMPARISON:  01/29/2015 and prior chest radiographs  FINDINGS: Cardiomegaly again noted.  Decreased pulmonary  edema is noted.  There is no evidence of pneumothorax or large pleural effusion.  No other changes identified.  IMPRESSION: Cardiomegaly with decreased pulmonary edema.   Electronically Signed   By: Tony Jones M.D.   On: 02/02/2015 10:15   Nm Pulmonary Perfusion  01/30/2015   CLINICAL DATA:  Shortness of breath. History of diabetes, Barrett's esophagus and mental disorder.  EXAM: NUCLEAR MEDICINE PERFUSION LUNG SCAN  TECHNIQUE: Perfusion images were obtained in multiple projections after intravenous injection of radiopharmaceutical.  RADIOPHARMACEUTICALS:  3.0 MCi Tc-75m MAA IV. Patient unable to cooperate with ventilation study.  COMPARISON:  Portable chest yesterday.  FINDINGS: Both lungs appear well perfused without wedge-shaped defects to suggest pulmonary embolism. The heart remains mildly enlarged.  IMPRESSION: No evidence of pulmonary embolism on nuclear medicine perfusion study.   Electronically Signed  By: Richardean Sale M.D.   On: 01/30/2015 15:30   Dg Chest Port 1 View  01/29/2015   CLINICAL DATA:  Shortness of breath.  Hypertension and diabetes  EXAM: PORTABLE CHEST 1 VIEW  COMPARISON:  09/26/2013  FINDINGS: There is moderate cardiac enlargement. Mild diffuse interstitial edema. No focal airspace consolidation. No pleural effusions noted. The visualized osseous structures are unremarkable.  IMPRESSION: 1. Cardiac enlargement and mild interstitial edema.   Electronically Signed   By: Kerby Moors M.D.   On: 01/29/2015 19:00   Dg Abd Portable 1v  01/29/2015   CLINICAL DATA:  Abdominal pain and distension  EXAM: PORTABLE ABDOMEN - 1 VIEW  COMPARISON:  March 09, 2013  FINDINGS: There is relative paucity of bowel gas. Pelvic phleboliths are identified.  IMPRESSION: Relative paucity of bowel gas, nonspecific.   Electronically Signed   By: Abelardo Diesel M.D.   On: 01/29/2015 18:59     Microbiology: Recent Results (from the past 240 hour(s))  Urine culture     Status: None   Collection  Time: 01/29/15  5:16 PM  Result Value Ref Range Status   Specimen Description URINE, CLEAN CATCH  Final   Special Requests NONE  Final   Culture MULTIPLE SPECIES PRESENT, SUGGEST RECOLLECTION  Final   Report Status 01/30/2015 FINAL  Final     Labs: Basic Metabolic Panel:  Recent Labs Lab 01/29/15 1729 01/29/15 2359 01/30/15 0519 01/31/15 0311 02/02/15 0310 02/03/15 0326  NA 137  --  140 138 137 136  K 4.9  --  3.6 3.7 3.7 3.4*  CL 90*  --  91* 89* 91* 92*  CO2 33*  --  39* 39* 37* 36*  GLUCOSE 121*  --  99 122* 140* 133*  BUN 32*  --  27* 26* 24* 20  CREATININE 1.26* 1.27* 1.28* 1.30* 1.18 1.21  CALCIUM 9.6  --  8.8* 8.2* 8.3* 8.2*   Liver Function Tests:  Recent Labs Lab 01/29/15 1729  AST 39  ALT 32  ALKPHOS 79  BILITOT 0.9  PROT 6.7  ALBUMIN 3.0*   No results for input(s): LIPASE, AMYLASE in the last 168 hours. No results for input(s): AMMONIA in the last 168 hours. CBC:  Recent Labs Lab 01/29/15 1729 01/29/15 2359 01/30/15 0519  WBC 9.5 9.4 8.2  NEUTROABS 8.0*  --  6.5  HGB 14.0 13.9 13.4  HCT 45.7 44.8 44.2  MCV 85.3 85.7 85.8  PLT 156 146* 147*   Cardiac Enzymes:  Recent Labs Lab 01/29/15 2359 01/30/15 0519 01/30/15 1005  TROPONINI 0.04* 0.05* 0.03   BNP: Invalid input(s): POCBNP CBG:  Recent Labs Lab 02/02/15 1110 02/02/15 1627 02/02/15 2049 02/03/15 0538 02/03/15 1109  GLUCAP 205* 141* 186* 142* 250*    Time coordinating discharge:  Greater than 30 minutes  Signed:  Debria Broecker, DO [facility] Pager: [phone] 02/03/2015, 1:11 PM

## 2015-02-03 NOTE — Progress Notes (Signed)
Patient set up with [facility] for [facility]. Home oxygen ordered and to be delivered to the room today prior to discharging home. Mindi Slicker [facility] [phone]

## 2015-02-03 NOTE — Progress Notes (Signed)
Heart Failure Navigator Consult Note  Presentation: Tony Jones is a 54 y.o. male with history of mental retardation, diabetes mellitus type 2, hypertension, hyperlipidemia was brought to the ER after patient was found to be hypoxic and short of breath. As per the family patient has been having these symptoms for last 3 days. Denies any fever chills chest pain or productive cough. In the ER patient was found to be hypoxic and requiring 4 L nasal cannula to maintain the oxygen saturation more than 90%. As per patient's mother patient has been compliant with his Lasix. Family noticed some lower extremity edema over the last few days. Chest x-ray in the ER shows pulmonary congestion and was given Lasix and admitted for CHF. Since patient's father's death in 2022-11-30 patient has been having poor appetite. But denies any abdominal pain. Abdomen appears benign. Patient also has been having some dysuria and was recently treated for UTI 3 weeks ago. UA is unremarkable today.   Past Medical History  Diagnosis Date  . Mental disorder   . Anxiety   . GERD (gastroesophageal reflux disease)   . Barrett esophagus   . Diabetes mellitus type 2, uncontrolled   . Hypertension   . Dyslipidemia     Social History   Social History  . Marital Status: Single    Spouse Name: N/A  . Number of Children: N/A  . Years of Education: N/A   Social History Main Topics  . Smoking status: Never Smoker   . Smokeless tobacco: Never Used  . Alcohol Use: No  . Drug Use: No  . Sexual Activity: No   Other Topics Concern  . None   Social History Narrative    ECHO:Study Conclusions--01/30/15  - Left ventricle: The cavity size was normal. Wall thickness was increased in a pattern of mild LVH. Systolic function was normal. The estimated ejection fraction was in the range of 55% to 60%. Wall motion was normal; there were no regional wall motion abnormalities. Left ventricular diastolic function parameters were  normal. - Right ventricle: The cavity size was mildly dilated. - Right atrium: The atrium was mildly dilated.  Transthoracic echocardiography. M-mode, complete 2D, spectral Doppler, and color Doppler. Birthdate: Patient birthdate: 1961/04/16. Age: Patient is 54 yr old. Sex: Gender: male. BMI: 41.2 kg/m^2. Blood pressure:   114/76 Patient status: Inpatient. Study date: Study date: 01/30/2015. Study time: 11:29 AM. Location: Bedside.  BNP    Component Value Date/Time   BNP 387.1* 01/29/2015 1929    ProBNP    Component Value Date/Time   PROBNP 184.5* 09/28/2013 0325     Education Assessment and Provision:  Detailed education and instructions provided on heart failure disease management including the following:  Signs and symptoms of Heart Failure When to call the physician Importance of daily weights Low sodium diet Fluid restriction Medication management Anticipated future follow-up appointments  Patient education given on each of the above topics.  Patient acknowledges understanding and acceptance of all instructions.   I spoke with patient and his mother regarding his HF.  He lives with her in [place].  She tells me that his father (deceased) had HF and that they are very aware of HF recommendations.  She insures that he weighs daily and can teach back when to contact the physician.  She tells me that they are careful with salt and sodium.  I reinforced the importance of a low sodium diet and high sodium foods to avoid.  She admits that he is resistant to  any exercise and is trying to plan ways to insure that he has more exercise.  She denies any issues with getting or taking prescribed medications.   He currently is not seeing a cardiologist outpatient--yet she tells me that Dr. Carles Collet has recommended that he see one after discharge.  Education Materials:  "Living Better With Heart Failure" Booklet, Daily Weight Tracker Tool    High Risk Criteria for  Readmission and/or Poor Patient Outcomes:   EF <30%- No 55-60%  2 or more admissions in 6 months- no  Difficult social situation- no  Demonstrates medication noncompliance- no--denies  Barriers of Care:  Knowledge and compliance  Discharge Planning:   Plans to return to home with mother.  He would benefit from [facility] for ongoing education, compliance reinforcement and symptom recognition.

## 2015-02-03 NOTE — Evaluation (Signed)
Occupational Therapy Evaluation Patient Details Name: Tony Jones MRN: [id number] DOB: 02-12-1961 Today's Date: 02/03/2015    History of Present Illness 54 year old male with a history of mental retardation, diabetes mellitus type 2, hypertension, and Barrett's esophagus presented with 2-3 day history of worsening shortness of breath and lower extremity edema. admitted for Acute respiratory failure with hypoxia and Acute on chronic diastolic CHF   Clinical Impression   Pt is at set up - sup level with ADLs and ADL mobility. No further acute OT services are indicated at this time    Follow Up Recommendations  No OT follow up;Supervision - Intermittent    Equipment Recommendations  None recommended by OT    Recommendations for Other Services       Precautions / Restrictions Precautions Precautions: Fall Precaution Comments: monitor O2 Restrictions Weight Bearing Restrictions: No      Mobility Bed Mobility Overal bed mobility: Modified Independent                Transfers Overall transfer level: Needs assistance Equipment used: None Transfers: Sit to/from Stand Sit to Stand: Supervision         General transfer comment: supervision for safety.     Balance   Sitting-balance support: No upper extremity supported;Feet supported Sitting balance-Leahy Scale: Good     Standing balance support: No upper extremity supported;During functional activity Standing balance-Leahy Scale: Fair                              ADL Overall ADL's : Needs assistance/impaired     Grooming: Wash/dry hands;Wash/dry face;Supervision/safety;Standing   Upper Body Bathing: Set up;Sitting   Lower Body Bathing: Set up;Sit to/from stand   Upper Body Dressing : Set up;Sitting   Lower Body Dressing: Set up;Sit to/from stand       Toileting- Water quality scientist and Hygiene: Modified independent   Tub/ Banker: Supervision/safety   Functional mobility  during ADLs: Supervision/safety       Vision  no change from baseline   Perception Perception Perception Tested?: No   Praxis Praxis Praxis tested?: Not tested    Pertinent Vitals/Pain Pain Assessment: No/denies pain     Hand Dominance Right   Extremity/Trunk Assessment Upper Extremity Assessment Upper Extremity Assessment: Overall WFL for tasks assessed   Lower Extremity Assessment Lower Extremity Assessment: Defer to PT evaluation   Cervical / Trunk Assessment Cervical / Trunk Assessment: Normal   Communication Communication Communication: No difficulties   Cognition Arousal/Alertness: Awake/alert Behavior During Therapy: WFL for tasks assessed/performed Overall Cognitive Status: History of cognitive impairments - at baseline                     General Comments   pt pleasant and cooperative, mother supportive                 Home Living Family/patient expects to be discharged to:: Private residence Living Arrangements: Parent Available Help at Discharge: Family;Available 24 hours/day Type of Home: House Home Access: Level entry     Home Layout: One level     Bathroom Shower/Tub: Walk-in shower;Door   [employer] Toilet: Standard     Home Equipment: Kasandra Knudsen - single point;Walker - 4 wheels;Wheelchair - manual;Bedside commode          Prior Functioning/Environment Level of Independence: Needs assistance  Gait / Transfers Assistance Needed: no device to ambulate ADL's / Homemaking Assistance Needed: Mother assists with bathroom needs  bath/ and socks and shoes at times        OT Diagnosis: Generalized weakness   OT Problem List: Decreased activity tolerance   OT Treatment/Interventions:   none   OT Goals(Current goals can be found in the care plan section) Acute Rehab OT Goals Patient Stated Goal: none stated OT Goal Formulation: With patient/family Time For Goal Achievement: 02/10/15 Potential to Achieve Goals: Good  OT Frequency:   none   Barriers to D/C:  none                        End of Session    Activity Tolerance: Patient tolerated treatment well Patient left: in chair   Time: 5726-2035 OT Time Calculation (min): 19 min Charges:  OT General Charges $OT Visit: 1 Procedure OT Evaluation $Initial OT Evaluation Tier I: 1 Procedure OT Treatments $Therapeutic Activity: 8-22 mins G-Codes:    Britt Bottom 02/03/2015, 1:15 PM

## 2015-02-03 NOTE — Care Management Important Message (Signed)
Important Message  Patient Details  Name: Tony Jones MRN: [id number] Date of Birth: 29-Apr-1961   Medicare Important Message Given:  Yes-second notification given    Delorse Lek 02/03/2015, 1:27 PM

## 2015-02-05 ENCOUNTER — Telehealth (HOSPITAL_COMMUNITY): Payer: Self-pay | Admitting: Surgery

## 2015-02-05 LAB — HIV 1/2 AB DIFFERENTIATION
HIV 1 Ab: NEGATIVE
HIV 2 Ab: NEGATIVE

## 2015-02-05 LAB — RNA QUALITATIVE: HIV 1 RNA Qualitative: 1

## 2015-02-05 LAB — HIV ANTIBODY (ROUTINE TESTING W REFLEX)

## 2015-02-05 NOTE — Telephone Encounter (Signed)
Heart Failure Nurse Navigator Post Discharge Telephone Call  I called to check on Tony Jones regarding his recent hospitalization.  I spoke to his mother who also happened to be on hold with [facility] regarding his oxygen and the tank size.  She seemed upset that he was unable to manage moving the tanks around while ambulating.  He has been weighing and weight today was 210 lbs versus 212 lbs on discharge date 02/03/15.  She says they have been "sticking with" low sodium diet and avoiding high sodium foods.  His mother denies any issues with getting or taking prescribed medications.  I have asked her to call me back if the oxygen situation is not resolved or if she has any questions or concerns related to his HF.  She says that she is making a follow-up appt with her cardiologist for Tony Jones based on referral from Hospitalist.

## 2015-08-31 DIAGNOSIS — R0682 Tachypnea, not elsewhere classified: Secondary | ICD-10-CM | POA: Diagnosis not present

## 2015-08-31 DIAGNOSIS — X58XXXA Exposure to other specified factors, initial encounter: Secondary | ICD-10-CM | POA: Diagnosis not present

## 2015-08-31 DIAGNOSIS — Z8679 Personal history of other diseases of the circulatory system: Secondary | ICD-10-CM | POA: Diagnosis not present

## 2015-08-31 DIAGNOSIS — Z7984 Long term (current) use of oral hypoglycemic drugs: Secondary | ICD-10-CM | POA: Diagnosis not present

## 2015-08-31 DIAGNOSIS — J811 Chronic pulmonary edema: Secondary | ICD-10-CM | POA: Diagnosis not present

## 2015-08-31 DIAGNOSIS — T7840XA Allergy, unspecified, initial encounter: Secondary | ICD-10-CM | POA: Diagnosis not present

## 2015-08-31 DIAGNOSIS — I10 Essential (primary) hypertension: Secondary | ICD-10-CM | POA: Diagnosis not present

## 2015-08-31 DIAGNOSIS — R609 Edema, unspecified: Secondary | ICD-10-CM | POA: Diagnosis not present

## 2015-08-31 DIAGNOSIS — R0902 Hypoxemia: Secondary | ICD-10-CM | POA: Diagnosis not present

## 2015-08-31 DIAGNOSIS — Z79899 Other long term (current) drug therapy: Secondary | ICD-10-CM | POA: Diagnosis not present

## 2015-08-31 DIAGNOSIS — R0602 Shortness of breath: Secondary | ICD-10-CM | POA: Diagnosis not present

## 2015-08-31 DIAGNOSIS — E119 Type 2 diabetes mellitus without complications: Secondary | ICD-10-CM | POA: Diagnosis not present

## 2015-09-01 DIAGNOSIS — R21 Rash and other nonspecific skin eruption: Secondary | ICD-10-CM | POA: Diagnosis not present

## 2015-09-01 DIAGNOSIS — I5032 Chronic diastolic (congestive) heart failure: Secondary | ICD-10-CM | POA: Diagnosis not present

## 2015-09-01 DIAGNOSIS — R0601 Orthopnea: Secondary | ICD-10-CM | POA: Diagnosis not present

## 2015-09-01 DIAGNOSIS — R0602 Shortness of breath: Secondary | ICD-10-CM | POA: Diagnosis not present

## 2015-09-01 DIAGNOSIS — E6609 Other obesity due to excess calories: Secondary | ICD-10-CM | POA: Diagnosis not present

## 2015-09-09 ENCOUNTER — Ambulatory Visit (INDEPENDENT_AMBULATORY_CARE_PROVIDER_SITE_OTHER): Payer: PPO | Admitting: Cardiology

## 2015-09-09 ENCOUNTER — Encounter: Payer: Self-pay | Admitting: Cardiology

## 2015-09-09 VITALS — BP 110/70 | HR 102 | Ht 62.0 in | Wt 208.4 lb

## 2015-09-09 DIAGNOSIS — R0902 Hypoxemia: Secondary | ICD-10-CM | POA: Diagnosis not present

## 2015-09-09 DIAGNOSIS — I10 Essential (primary) hypertension: Secondary | ICD-10-CM | POA: Diagnosis not present

## 2015-09-09 DIAGNOSIS — E7800 Pure hypercholesterolemia, unspecified: Secondary | ICD-10-CM | POA: Diagnosis not present

## 2015-09-09 DIAGNOSIS — R0600 Dyspnea, unspecified: Secondary | ICD-10-CM | POA: Diagnosis not present

## 2015-09-09 DIAGNOSIS — F79 Unspecified intellectual disabilities: Secondary | ICD-10-CM | POA: Diagnosis not present

## 2015-09-09 DIAGNOSIS — I5032 Chronic diastolic (congestive) heart failure: Secondary | ICD-10-CM

## 2015-09-09 DIAGNOSIS — R7981 Abnormal blood-gas level: Secondary | ICD-10-CM

## 2015-09-09 DIAGNOSIS — E1165 Type 2 diabetes mellitus with hyperglycemia: Secondary | ICD-10-CM | POA: Diagnosis not present

## 2015-09-09 DIAGNOSIS — M1A09X0 Idiopathic chronic gout, multiple sites, without tophus (tophi): Secondary | ICD-10-CM | POA: Diagnosis not present

## 2015-09-09 DIAGNOSIS — E668 Other obesity: Secondary | ICD-10-CM | POA: Diagnosis not present

## 2015-09-09 DIAGNOSIS — R0602 Shortness of breath: Secondary | ICD-10-CM | POA: Diagnosis not present

## 2015-09-09 DIAGNOSIS — K219 Gastro-esophageal reflux disease without esophagitis: Secondary | ICD-10-CM | POA: Diagnosis not present

## 2015-09-09 NOTE — Patient Instructions (Addendum)
Your physician has requested that you have an echocardiogram WITH BUBBLE STUDY. Echocardiography is a painless test that uses sound waves to create images of your heart. It provides your doctor with information about the size and shape of your heart and how well your heart's chambers and valves are working. This procedure takes approximately one hour. There are no restrictions for this procedure.   You have been referred to  PULMONOLOGIST-- [place]    Your physician recommends that you schedule a follow-up appointment in [place].

## 2015-09-09 NOTE — Progress Notes (Signed)
PATIENT: Tony Jones MRN: [id number] DOB: 01-26-1961 PCP: Stephens Shire, MD  Clinic Note: Chief Complaint  Patient presents with  . New Patient (Initial Visit)    Hypoxia  . Shortness of Breath  . Congestive Heart Failure    HPI: Tony Jones is a 55 y.o. male with a PMH below who presents today for Cardiology Consultation / Establish care. He is the son of a former patient of mine.  He has long-standing congenital mental retardation & lives with his mother.  He Is accompanied by his mother and one of his brothers. He has a relatively significant amount of anxiety and is not good with procedural based tests.  Echo 01/2015 -  - Left ventricle: The cavity size was normal. Wall thickness was increased in a pattern of mild LVH. Systolic function was normal. EF ~ 55% to 60%. No RWMA. ? Normal diastolic parameters. - Right ventricle: The cavity size was mildly dilated. - Right atrium: The atrium was mildly dilated.  ER Visit -- 08/31/2015 @ [facility] : HPI Comments: Patient developed shortness of breath, he has a history of congestive heart failure. He is not producing much urine with his Lasix today. He was treated with 2 neb treatments via EMS. He had a room air sat of 70 when EMS arrived with some dyspnea. His baseline O2 saturation is 88-90% on room air. The patient has MR and will sometimes hold his breath if he is concentrating on other things which will worsen his hypoxia. He is developing a bright red rash with urticarial appearance on arrival to the ED that was not present at home. The family cannot think of any particular triggering allergic reaction. --> Rx with Benadryl, Zantac & Decadron.   Interval History: Tony Jones comes in today at the behest of his primary care provider. He is noted there in his office to be hypoxic with oxygen levels down into the 60s. They're currently in the process of trying to order home oxygen for him. For the most part, he doesn't comment on any resting  dyspnea or difficulty breathing. He does note exertional dyspnea and some orthopnea. I don't know that he can really describe PND, but he does sleep in a recliner sitting up. He does not have much the way of significant edema but does have some mild lower TIMI edema. This is being treated with high-dose of Lasix.  He is very difficult as a historian, of note he will describe the short of breath tonic-clonic to his brother's truck. He gets short of breath walking around stores. He had short of breath walking up stairs. There is no description of any chest tightness or pressure with rest or exertion. He denies any rapid irregular heartbeat sensations palpitations. No syncope or near syncope. No TIA or amaurosis fugax.  In the clinic today he notes a little dyspnea after having walked into the clinic room. We put on oxygen saturation monitor on him while sitting still and it went down as low as 64. With deep inspiration and arms raised the saturation levels 1 up into the 80s and 90s and if he focused on bruit breathing he could stay in the 80s and 90s. Otherwise, not concentrating, his saturations dropped again. He does not appear cyanotic or have any signs of clubbing. He doesn't actually seem to be struggling to breathe.  Past Medical History  Diagnosis Date  . Mental disorder   . Anxiety   . GERD (gastroesophageal reflux disease)   .  Barrett esophagus   . Diabetes mellitus type 2, uncontrolled ([place])   . Hypertension   . Dyslipidemia     Prior Cardiac Evaluation and Past Surgical History: Past Surgical History  Procedure Laterality Date  . Appendectomy      Allergies  Allergen Reactions  . Codeine Nausea And Vomiting    Current Outpatient Prescriptions  Medication Sig Dispense Refill  . acetaminophen (TYLENOL) 500 MG tablet Take 500 mg by mouth every 6 (six) hours as needed for mild pain.    Marland Kitchen allopurinol (ZYLOPRIM) 300 MG tablet Take 300 mg by mouth daily.    Marland Kitchen atorvastatin (LIPITOR)  20 MG tablet Take 20 mg by mouth daily.    . carvedilol (COREG) 3.125 MG tablet Take 1 tablet (3.125 mg total) by mouth 2 (two) times daily with a meal. 60 tablet 0  . fesoterodine (TOVIAZ) 8 MG TB24 tablet Take 8 mg by mouth daily.    . furosemide (LASIX) 80 MG tablet Take 80 mg by mouth daily.    Marland Kitchen glimepiride (AMARYL) 2 MG tablet Take 2 mg by mouth daily with breakfast.    . levocetirizine (XYZAL) 5 MG tablet Take 5 mg by mouth every evening.    Marland Kitchen LORazepam (ATIVAN) 0.5 MG tablet Take 0.5 mg by mouth every 8 (eight) hours as needed for anxiety.    . metFORMIN (GLUCOPHAGE) 500 MG tablet Take 2 tablets (1,000 mg total) by mouth 2 (two) times daily with a meal. 60 tablet 0  . oxybutynin (DITROPAN) 5 MG tablet Take 5 mg by mouth 2 (two) times daily.    . pantoprazole (PROTONIX) 40 MG tablet Take 40 mg by mouth 2 (two) times daily.    . sertraline (ZOLOFT) 50 MG tablet Take 50 mg by mouth daily.    Marland Kitchen triamcinolone cream (KENALOG) 0.1 % Apply 1 application topically daily as needed (rash to back, face, neck, back of knees.).    Marland Kitchen vitamin B-12 (CYANOCOBALAMIN) 500 MCG tablet Take 2 tablets (1,000 mcg total) by mouth daily. 30 tablet 0   No current facility-administered medications for this visit.    Social History   Social History Narrative    family history includes Atrial fibrillation in his father and paternal grandfather; Congestive Heart Failure in his father and paternal grandfather; Diabetes in his mother; Fainting in his paternal grandfather; Heart attack in his father; Heart disease in his father; Hypertension in his mother; Stroke in his mother.  ROS: A comprehensive Review of Systems - was performed Review of Systems  Constitutional: Negative for fever and chills.  HENT: Negative for congestion and nosebleeds.   Eyes: Negative for blurred vision.  Respiratory: Positive for cough (Rarely) and shortness of breath. Negative for wheezing.   Cardiovascular: Positive for orthopnea, leg  swelling and PND. Negative for chest pain and palpitations.  Gastrointestinal: Positive for heartburn. Negative for abdominal pain, blood in stool and melena.  Genitourinary: Negative for hematuria.  Musculoskeletal: Positive for joint pain (Arthritis).  Neurological: Positive for dizziness. Negative for weakness and headaches.  Endo/Heme/Allergies: Negative for polydipsia. Does not bruise/bleed easily.  Psychiatric/Behavioral: The patient is nervous/anxious.        He is of limited insight is that of a young child. This is according to his mother  All other systems reviewed and are negative.   PHYSICAL EXAM BP 110/70 mmHg  Pulse 102  Ht 5\' 2"  (1.575 m)  Wt 208 lb 6.4 oz (94.53 kg)  BMI 38.11 kg/m2 General appearance: alert, cooperative, appears  stated age, morbidly obese and He has significant truncal obesity with a very large thoracic cavity. He has some syndromic features of elongated face etc. there likely are related to his neurologic condition Neck: no adenopathy, no carotid bruit, supple, symmetrical, trachea midline and Really unable to determine level JVP because of his neck being very thick Lungs: Diminished breath sounds throughout. Nonlabored. Good air movement. No wheezes rales or rhonchi. Heart: Somewhat distant but tachycardic with normal S1 and S2. No obvious M/R/G. Abdomen: Extreme truncal obesity but soft nontender nondistended. Normoactive bowel sounds. Extremities: extremities normal, atraumatic, no cyanosis or edema and no ulcers, gangrene or trophic changes Pulses: 2+ and symmetric Skin: Skin color, texture, turgor normal. No rashes or lesions or He is not cyanotic or pale Neurologic: Mental status: alertness: alert, orientation: time, date, person, place, city, thought content exhibits He has some logical connections also has trouble separating situations Cranial nerves: normal Motor: grossly normal   Adult ECG Report  Rate: 102 ;  Rhythm: sinus tachycardia and  Nonspecific ST and T-wave abnormality is. Cannot exclude right atrial enlargement  QRS Axis: 87 (rightward) ;  PR Interval: 136 ;  QRS Duration: 92 ; QTc: 461;  Voltages: Overall normal, but with questionable right atrial enlargement  Narrative Interpretation: Abnormal EKG with some suggestion of possible pulmonary disease.  Recent Labs: Not available  ASSESSMENT / PLAN: Rylon clearly has hypoxia based on oxygen saturations. I don't doubt that he has some component of obesity hypoventilation syndrome and may even have some restrictive lung disease based on his body habitus, however this would not likely explain oxygen desaturation that goes up with deep inspiration, he clearly had recuperative additional alveoli allowing for better oxygen transportation.   I am also concerned that he may very well have AVMs or some other type of shunting physiology. Ideally, in order to fully evaluate this from cardiac standpoint, the best way to do would be a complete right heart catheterization with shunt run, however I don't think that Tony Jones would be amenable to an invasive procedure, nor do I think the family would be agreeable to this. We therefore did not really fully discussed that option.  As a potential alternative, I will order a echocardiogram with bubble study. This may potentially show evidence of shunting.  This would also allow [place] to better assess his diastolic relaxation. His echocardiogram from the outside echo suggested normal diastolic filling  Ultimately, however regardless of the etiology, I think the main stay treatment here is full-time oxygen, and even possibly CPAP/BiPAP at night.  I do think that he warrants evaluation by pulmonary medicine, I'm going to refer him to Dr. Halford Chessman.  His mom is in the process of trying to obtain home oxygen from his father's former providers. His PCP has written for home oxygen I think continue suction is probably the only way to ensure his oxygen level goes  up.    Problem List Items Addressed This Visit    Moderate obesity (Chronic)    As part of his pulmonary evaluation, he may very well have restrictive airway disease simply based on his body habitus with small thoracic cavity. Perhaps nighttime CPAP or BiPAP would help recruit more airspace.      Relevant Medications   glimepiride (AMARYL) 2 MG tablet   Hypoxemia (Chronic)    Plan is to check an echocardiogram with bubble study; no plans for invasive evaluation Treatment is supplemental oxygen      Chronic diastolic heart failure (HCC) (Chronic)  The echocardiogram we doesn't make sense for this diagnosis. We are reassured check an echocardiogram but also with bubble study to look for shunting      Relevant Medications   atorvastatin (LIPITOR) 20 MG tablet   furosemide (LASIX) 80 MG tablet    Other Visit Diagnoses    Dyspnea    -  Primary    Relevant Orders    EKG 12-Lead (Completed)    Ambulatory referral to Pulmonology    ECHO BUBBLE STUDY LIMITED    Low oxygen saturation        Relevant Orders    EKG 12-Lead (Completed)    Ambulatory referral to Pulmonology    ECHO BUBBLE STUDY LIMITED       Meds ordered this encounter  Medications  . atorvastatin (LIPITOR) 20 MG tablet    Sig: Take 20 mg by mouth daily.  . furosemide (LASIX) 80 MG tablet    Sig: Take 80 mg by mouth daily.  Marland Kitchen LORazepam (ATIVAN) 0.5 MG tablet    Sig: Take 0.5 mg by mouth every 8 (eight) hours as needed for anxiety.  Marland Kitchen levocetirizine (XYZAL) 5 MG tablet    Sig: Take 5 mg by mouth every evening.  Marland Kitchen glimepiride (AMARYL) 2 MG tablet    Sig: Take 2 mg by mouth daily with breakfast.  . oxybutynin (DITROPAN) 5 MG tablet    Sig: Take 5 mg by mouth 2 (two) times daily.    Followup: ~1 months  This was a very difficult interview requiring assistance from both the mother and brother are noted to go to communicate with the patient. His mental disorder makes it very difficult to really get symptoms from  him. Also takes examination difficult. Greater than 1 hour was spent in direct patient contact. Over 50% of this was with direct consultation and determining the best diagnostic and treatment options.    Glenetta Hew, M.D., M.S. Interventional Cardiologist   Pager # [phone] Phone # [phone] [address] [place], [place] [address]

## 2015-09-10 DIAGNOSIS — I5032 Chronic diastolic (congestive) heart failure: Secondary | ICD-10-CM | POA: Diagnosis not present

## 2015-09-10 DIAGNOSIS — R0902 Hypoxemia: Secondary | ICD-10-CM | POA: Diagnosis not present

## 2015-09-17 ENCOUNTER — Encounter: Payer: Self-pay | Admitting: Cardiology

## 2015-09-17 DIAGNOSIS — E668 Other obesity: Secondary | ICD-10-CM | POA: Insufficient documentation

## 2015-09-17 DIAGNOSIS — R0902 Hypoxemia: Secondary | ICD-10-CM | POA: Insufficient documentation

## 2015-09-17 DIAGNOSIS — E669 Obesity, unspecified: Secondary | ICD-10-CM | POA: Insufficient documentation

## 2015-09-17 NOTE — Assessment & Plan Note (Signed)
Plan is to check an echocardiogram with bubble study; no plans for invasive evaluation Treatment is supplemental oxygen

## 2015-09-17 NOTE — Assessment & Plan Note (Signed)
The echocardiogram we doesn't make sense for this diagnosis. We are reassured check an echocardiogram but also with bubble study to look for shunting

## 2015-09-17 NOTE — Assessment & Plan Note (Signed)
As part of his pulmonary evaluation, he may very well have restrictive airway disease simply based on his body habitus with small thoracic cavity. Perhaps nighttime CPAP or BiPAP would help recruit more airspace.

## 2015-09-25 ENCOUNTER — Other Ambulatory Visit: Payer: Self-pay

## 2015-09-25 ENCOUNTER — Ambulatory Visit (HOSPITAL_COMMUNITY): Payer: PPO | Attending: Cardiovascular Disease

## 2015-09-25 DIAGNOSIS — I517 Cardiomegaly: Secondary | ICD-10-CM | POA: Insufficient documentation

## 2015-09-25 DIAGNOSIS — R0600 Dyspnea, unspecified: Secondary | ICD-10-CM

## 2015-09-25 DIAGNOSIS — I359 Nonrheumatic aortic valve disorder, unspecified: Secondary | ICD-10-CM | POA: Diagnosis not present

## 2015-09-25 DIAGNOSIS — R7981 Abnormal blood-gas level: Secondary | ICD-10-CM

## 2015-09-25 DIAGNOSIS — I358 Other nonrheumatic aortic valve disorders: Secondary | ICD-10-CM | POA: Diagnosis not present

## 2015-09-26 NOTE — Progress Notes (Signed)
Quick Note:  Echo results: Good news: Essentially normal echocardiogram and normal pump function and normal valve function. No regional wall motion abnormalities means No signs to suggest heart attack.. The heart is actually hyperdynamic pumping out 65-70%. (Normal is 55-65%) There is no evidence of blood shunting in the heart with bubble study. This means that shunting (vain blood crossing over to the artery blood) is probably not a major factor in the low oxygen levels.  Glenetta Hew, MD  Please forward to Stephens Shire, MD   ______

## 2015-10-07 ENCOUNTER — Ambulatory Visit: Payer: PPO | Admitting: Cardiology

## 2015-10-11 DIAGNOSIS — R0902 Hypoxemia: Secondary | ICD-10-CM | POA: Diagnosis not present

## 2015-10-11 DIAGNOSIS — I5032 Chronic diastolic (congestive) heart failure: Secondary | ICD-10-CM | POA: Diagnosis not present

## 2015-10-27 DIAGNOSIS — M25562 Pain in left knee: Secondary | ICD-10-CM | POA: Diagnosis not present

## 2015-10-27 DIAGNOSIS — M79662 Pain in left lower leg: Secondary | ICD-10-CM | POA: Diagnosis not present

## 2015-11-10 DIAGNOSIS — I5032 Chronic diastolic (congestive) heart failure: Secondary | ICD-10-CM | POA: Diagnosis not present

## 2015-11-10 DIAGNOSIS — R0902 Hypoxemia: Secondary | ICD-10-CM | POA: Diagnosis not present

## 2015-11-24 ENCOUNTER — Encounter: Payer: Self-pay | Admitting: Pulmonary Disease

## 2015-11-24 ENCOUNTER — Ambulatory Visit (INDEPENDENT_AMBULATORY_CARE_PROVIDER_SITE_OTHER): Payer: PPO | Admitting: Pulmonary Disease

## 2015-11-24 ENCOUNTER — Encounter (INDEPENDENT_AMBULATORY_CARE_PROVIDER_SITE_OTHER): Payer: Self-pay

## 2015-11-24 VITALS — BP 116/82 | HR 91

## 2015-11-24 DIAGNOSIS — J9611 Chronic respiratory failure with hypoxia: Secondary | ICD-10-CM

## 2015-11-24 DIAGNOSIS — G4730 Sleep apnea, unspecified: Secondary | ICD-10-CM

## 2015-11-24 DIAGNOSIS — G478 Other sleep disorders: Secondary | ICD-10-CM

## 2015-11-24 NOTE — Progress Notes (Signed)
Past surgical history He  has a past surgical history that includes Throat surgery.  Family history His family history includes Atrial fibrillation in his father and paternal grandfather; Brain cancer in his father; Congestive Heart Failure in his father and paternal grandfather; Diabetes in his mother; Fainting in his paternal grandfather; Heart attack in his father; Heart disease in his father; Hypertension in his mother; Melanoma in his father; Stroke in his mother.  Social history He  reports that he has never smoked. He has never used smokeless tobacco. He reports that he does not drink alcohol or use drugs.  Allergies  Allergen Reactions  . Codeine Nausea And Vomiting    Current Outpatient Prescriptions on File Prior to Visit  Medication Sig  . acetaminophen (TYLENOL) 500 MG tablet Take 500 mg by mouth every 6 (six) hours as needed for mild pain.  Marland Kitchen allopurinol (ZYLOPRIM) 300 MG tablet Take 300 mg by mouth daily.  Marland Kitchen atorvastatin (LIPITOR) 20 MG tablet Take 20 mg by mouth daily.  . carvedilol (COREG) 3.125 MG tablet Take 1 tablet (3.125 mg total) by mouth 2 (two) times daily with a meal.  . fesoterodine (TOVIAZ) 8 MG TB24 tablet Take 8 mg by mouth daily.  . furosemide (LASIX) 80 MG tablet Take 80 mg by mouth daily.  Marland Kitchen glimepiride (AMARYL) 2 MG tablet Take 2 mg by mouth daily with breakfast.  . levocetirizine (XYZAL) 5 MG tablet Take 5 mg by mouth every evening.  Marland Kitchen LORazepam (ATIVAN) 0.5 MG tablet Take 0.5 mg by mouth every 8 (eight) hours as needed for anxiety.  . metFORMIN (GLUCOPHAGE) 500 MG tablet Take 2 tablets (1,000 mg total) by mouth 2 (two) times daily with a meal.  . oxybutynin (DITROPAN) 5 MG tablet Take 5 mg by mouth 2 (two) times daily.  . pantoprazole (PROTONIX) 40 MG tablet Take 40 mg by mouth 2 (two) times daily.  . sertraline (ZOLOFT) 50 MG tablet Take 50 mg by mouth daily.  Marland Kitchen triamcinolone cream (KENALOG) 0.1 % Apply 1 application topically daily as needed (rash to  back, face, neck, back of knees.).  Marland Kitchen vitamin B-12 (CYANOCOBALAMIN) 500 MCG tablet Take 2 tablets (1,000 mcg total) by mouth daily.   No current facility-administered medications on file prior to visit.     Chief Complaint  Patient presents with  . Pulmonary Consult    Referred by Dr Ellyn Hack for Dyspnea and Low O2 sats ( upper 60's to 80's with 2 liter O2 pulsed dose )     Pulmonary tests V/Q scan 01/30/15 >> no PE  Cardiac tests Echo 01/30/15 >> mild LVH, EF 55 to 60%, mild RV dilation Echo bubble study 09/25/15 >> EF 65 to 70%, mild RV dilation, no shunt  Past medical history He  has a past medical history of Anxiety; Barrett esophagus; Diabetes mellitus type 2, uncontrolled ([place]); Dyslipidemia; GERD (gastroesophageal reflux disease); Hypertension; and Mental disorder.  Vital signs BP 116/82 (BP Location: Left Arm, Cuff Size: Normal)   Pulse 91   SpO2 95%   History of present illness Tony Jones is a 55 y.o. male with hypoxia.  He is accompanied by his family members who provided history.  He was born with an undefined muscular dystrophy.  His family reports that from birth until age 27 he essentially lived at [facility].  He eventually gained muscle tone.  He has always been a shallow breather, and was told he has a small airway and small chest cavity.  His disorder  is also associated with developmental delay.    He was in hospital in October 2016 for congestive heart failure.  He had lots of swelling, and he was started on oxygen.  He was given medication for fluid removal and his oxygen got better.  He was taken off oxygen.  In April he was noted to have low oxygen again, and oxygen was restarted.  He has been on 2 liters oxygen.  He uses continuous flow at home, but pulsed oxygen when he goes out.  They tried oxygen tanks when he goes out, but this was too cumbersome.  If his family reminds him to take a deep breath, then his oxygen level stays up.    He never smoked cigarettes.   There is no history of asthma.  Family is not aware of previous episodes of pneumonia.  He does have some trouble with his swallowing, and had to get his esophagus stretched.  He has been followed by cardiology for dyspnea.  He had Echo and bubble study which were relatively unremarkable.  He had V/Q scan which was negative, and chest xray showed cardiomegaly.  He did have elevated HCO3 on previous BMET.  It does not appear that he ever had ABG done before.  His previous thyroid tests were normal  Physical exam  General - pleasant, wearing oxygen ENT - Pupils reactive, no sinus tenderness, narrow oral passage, enlarged tongue, high arched palate, no LAN Cardiac - s1s2 regular, no murmur, pulses symmetric Chest - No wheeze/rales/dullness, good air entry, normal respiratory excursion Back - No focal tenderness Abd - Soft, non-tender, no organomegaly, + bowel sounds Ext - No edema Neuro - Normal strength, cranial nerves intact Skin - No rashes Psych - anxious to go home   CMP Latest Ref Rng & Units 02/03/2015 02/02/2015 01/31/2015  Glucose 65 - 99 mg/dL 133(H) 140(H) 122(H)  BUN 6 - 20 mg/dL 20 24(H) 26(H)  Creatinine 0.61 - 1.24 mg/dL 1.21 1.18 1.30(H)  Sodium 135 - 145 mmol/L 136 137 138  Potassium 3.5 - 5.1 mmol/L 3.4(L) 3.7 3.7  Chloride 101 - 111 mmol/L 92(L) 91(L) 89(L)  CO2 22 - 32 mmol/L 36(H) 37(H) 39(H)  Calcium 8.9 - 10.3 mg/dL 8.2(L) 8.3(L) 8.2(L)  Total Protein 6.5 - 8.1 g/dL - - -  Total Bilirubin 0.3 - 1.2 mg/dL - - -  Alkaline Phos 38 - 126 U/L - - -  AST 15 - 41 U/L - - -  ALT 17 - 63 U/L - - -     CBC Latest Ref Rng & Units 01/30/2015 01/29/2015 01/29/2015  WBC 4.0 - 10.5 K/uL 8.2 9.4 9.5  Hemoglobin 13.0 - 17.0 g/dL 13.4 13.9 14.0  Hematocrit 39.0 - 52.0 % 44.2 44.8 45.7  Platelets 150 - 400 K/uL 147(L) 146(L) 156     ABG    Component Value Date/Time   HCO3 40.3 (H) 01/29/2015 1753   TCO2 42 01/29/2015 1753   O2SAT 52.0 01/29/2015 1753     Discussion 55  yo male with history of undefined muscular dystrophy and developmental delay.  He has persistent hypoxemia.  He has elevated bicarbonate on previous chemistry.  I am almost certain that he has sleep disordered breathing with sleep apnea and hypoventilation.  This is likely resulting in daytime hypoxemia and most likely hypercapnia.  I had a detailed discussion with his family.  I reviewed different tests and therapeutic options, but concern is how well he will be able to tolerate these interventions.  I also explained that if he is not able to tolerate additional therapies, then he might go on to develop cor pulmonale with pulmonary hypertension.  He might also developed worsening respiratory failure requiring intubation and tracheostomy.   The family was not ready to commit to any plans regarding goals of care.   Assessment/plan  Sleep disordered breathing. - will try to arrange for in lab sleep study - his brother will stay with him during the test - advised he could try using low dose ativan during the test to help with anxiety from sleeping in strange environment - his brother has sleep apnea >> asked his brother to show him CPAP machine to help get used to idea that he might need machine like this  Chronic respiratory failure with hypoxia. - continue 2 liters oxygen - explained he would do better with continuous flow oxygen, but family thinks his current set up is better since he wouldn't wear oxygen with heavier tanks  Goals of care. - asked family to consider goals of care >> will address further at his next visit   Patient Instructions  Will arrange for in lab sleep study  Follow up in 2 months    Tony Mires, MD [facility] Pager:  [phone] 11/24/2015, 5:28 PM

## 2015-11-24 NOTE — Patient Instructions (Signed)
Will arrange for in lab sleep study  Follow up in 2 months

## 2015-12-11 DIAGNOSIS — R0902 Hypoxemia: Secondary | ICD-10-CM | POA: Diagnosis not present

## 2015-12-11 DIAGNOSIS — I5032 Chronic diastolic (congestive) heart failure: Secondary | ICD-10-CM | POA: Diagnosis not present

## 2016-01-06 ENCOUNTER — Encounter (HOSPITAL_BASED_OUTPATIENT_CLINIC_OR_DEPARTMENT_OTHER): Payer: PPO

## 2016-01-11 DIAGNOSIS — R0902 Hypoxemia: Secondary | ICD-10-CM | POA: Diagnosis not present

## 2016-01-11 DIAGNOSIS — I5032 Chronic diastolic (congestive) heart failure: Secondary | ICD-10-CM | POA: Diagnosis not present

## 2016-01-14 ENCOUNTER — Other Ambulatory Visit (HOSPITAL_COMMUNITY): Payer: Self-pay | Admitting: Physician Assistant

## 2016-01-14 DIAGNOSIS — R52 Pain, unspecified: Secondary | ICD-10-CM

## 2016-01-14 DIAGNOSIS — R0989 Other specified symptoms and signs involving the circulatory and respiratory systems: Secondary | ICD-10-CM | POA: Diagnosis not present

## 2016-01-14 DIAGNOSIS — M79661 Pain in right lower leg: Secondary | ICD-10-CM | POA: Diagnosis not present

## 2016-01-15 ENCOUNTER — Encounter (HOSPITAL_COMMUNITY): Payer: PPO

## 2016-01-15 ENCOUNTER — Ambulatory Visit (HOSPITAL_COMMUNITY)
Admission: RE | Admit: 2016-01-15 | Discharge: 2016-01-15 | Disposition: A | Discharge status: Home / Self Care | Payer: PPO | Source: Ambulatory Visit | Attending: Family Medicine | Admitting: Family Medicine

## 2016-01-15 ENCOUNTER — Ambulatory Visit (HOSPITAL_BASED_OUTPATIENT_CLINIC_OR_DEPARTMENT_OTHER)
Admission: RE | Admit: 2016-01-15 | Discharge: 2016-01-15 | Disposition: A | Discharge status: Home / Self Care | Payer: PPO | Source: Ambulatory Visit | Attending: Family Medicine | Admitting: Family Medicine

## 2016-01-15 DIAGNOSIS — M79604 Pain in right leg: Secondary | ICD-10-CM | POA: Insufficient documentation

## 2016-01-15 DIAGNOSIS — R52 Pain, unspecified: Secondary | ICD-10-CM

## 2016-01-15 DIAGNOSIS — M79661 Pain in right lower leg: Secondary | ICD-10-CM | POA: Diagnosis not present

## 2016-01-15 DIAGNOSIS — R0989 Other specified symptoms and signs involving the circulatory and respiratory systems: Secondary | ICD-10-CM | POA: Insufficient documentation

## 2016-01-15 NOTE — Progress Notes (Addendum)
**  Preliminary report by tech**  Right lower extremity venous duplex completed. There is no evidence of deep or superficial vein thrombosis involving the right lower extremity. All visualized vessels appear patent and compressible. There is no evidence of a Baker's cyst on the right. Results given to [place] at [phone].  01/15/16 11:34 AM Tony Jones RVT

## 2016-01-15 NOTE — Progress Notes (Signed)
VASCULAR LAB PRELIMINARY  ARTERIAL  ABI completed: Right and Left ABI's of 1.06 and 1.1 are indicative of normal arterial flow at rest.   RIGHT    LEFT    PRESSURE WAVEFORM  PRESSURE WAVEFORM  BRACHIAL 134 Triphasic BRACHIAL 126 Triphasic  DP 126 Triphasic DP 147 Triphasic  PT 142 Triphasic PT 134 Triphasic    RIGHT LEFT  ABI 1.06 1.1     Legrand Como, RVT 01/15/2016, 11:39 AM

## 2016-01-30 ENCOUNTER — Ambulatory Visit: Payer: PPO | Admitting: Pulmonary Disease

## 2016-02-10 DIAGNOSIS — R0902 Hypoxemia: Secondary | ICD-10-CM | POA: Diagnosis not present

## 2016-02-10 DIAGNOSIS — I5032 Chronic diastolic (congestive) heart failure: Secondary | ICD-10-CM | POA: Diagnosis not present

## 2016-02-17 DIAGNOSIS — M1A09X0 Idiopathic chronic gout, multiple sites, without tophus (tophi): Secondary | ICD-10-CM | POA: Diagnosis not present

## 2016-02-17 DIAGNOSIS — E1165 Type 2 diabetes mellitus with hyperglycemia: Secondary | ICD-10-CM | POA: Diagnosis not present

## 2016-02-17 DIAGNOSIS — L219 Seborrheic dermatitis, unspecified: Secondary | ICD-10-CM | POA: Diagnosis not present

## 2016-02-17 DIAGNOSIS — I10 Essential (primary) hypertension: Secondary | ICD-10-CM | POA: Diagnosis not present

## 2016-02-17 DIAGNOSIS — E7800 Pure hypercholesterolemia, unspecified: Secondary | ICD-10-CM | POA: Diagnosis not present

## 2016-02-17 DIAGNOSIS — J309 Allergic rhinitis, unspecified: Secondary | ICD-10-CM | POA: Diagnosis not present

## 2016-02-17 DIAGNOSIS — F419 Anxiety disorder, unspecified: Secondary | ICD-10-CM | POA: Diagnosis not present

## 2016-02-17 DIAGNOSIS — N3289 Other specified disorders of bladder: Secondary | ICD-10-CM | POA: Diagnosis not present

## 2016-02-17 DIAGNOSIS — K219 Gastro-esophageal reflux disease without esophagitis: Secondary | ICD-10-CM | POA: Diagnosis not present

## 2016-02-17 DIAGNOSIS — I5032 Chronic diastolic (congestive) heart failure: Secondary | ICD-10-CM | POA: Diagnosis not present

## 2016-02-17 IMAGING — CR DG CHEST 2V
2 series · 2 of 2 positions shown · non-contrast
Comparison: 09/27/2012.

CLINICAL DATA: Altered mental status.  Short of breath.

EXAM:
CHEST  2 VIEW

[x chest ap]
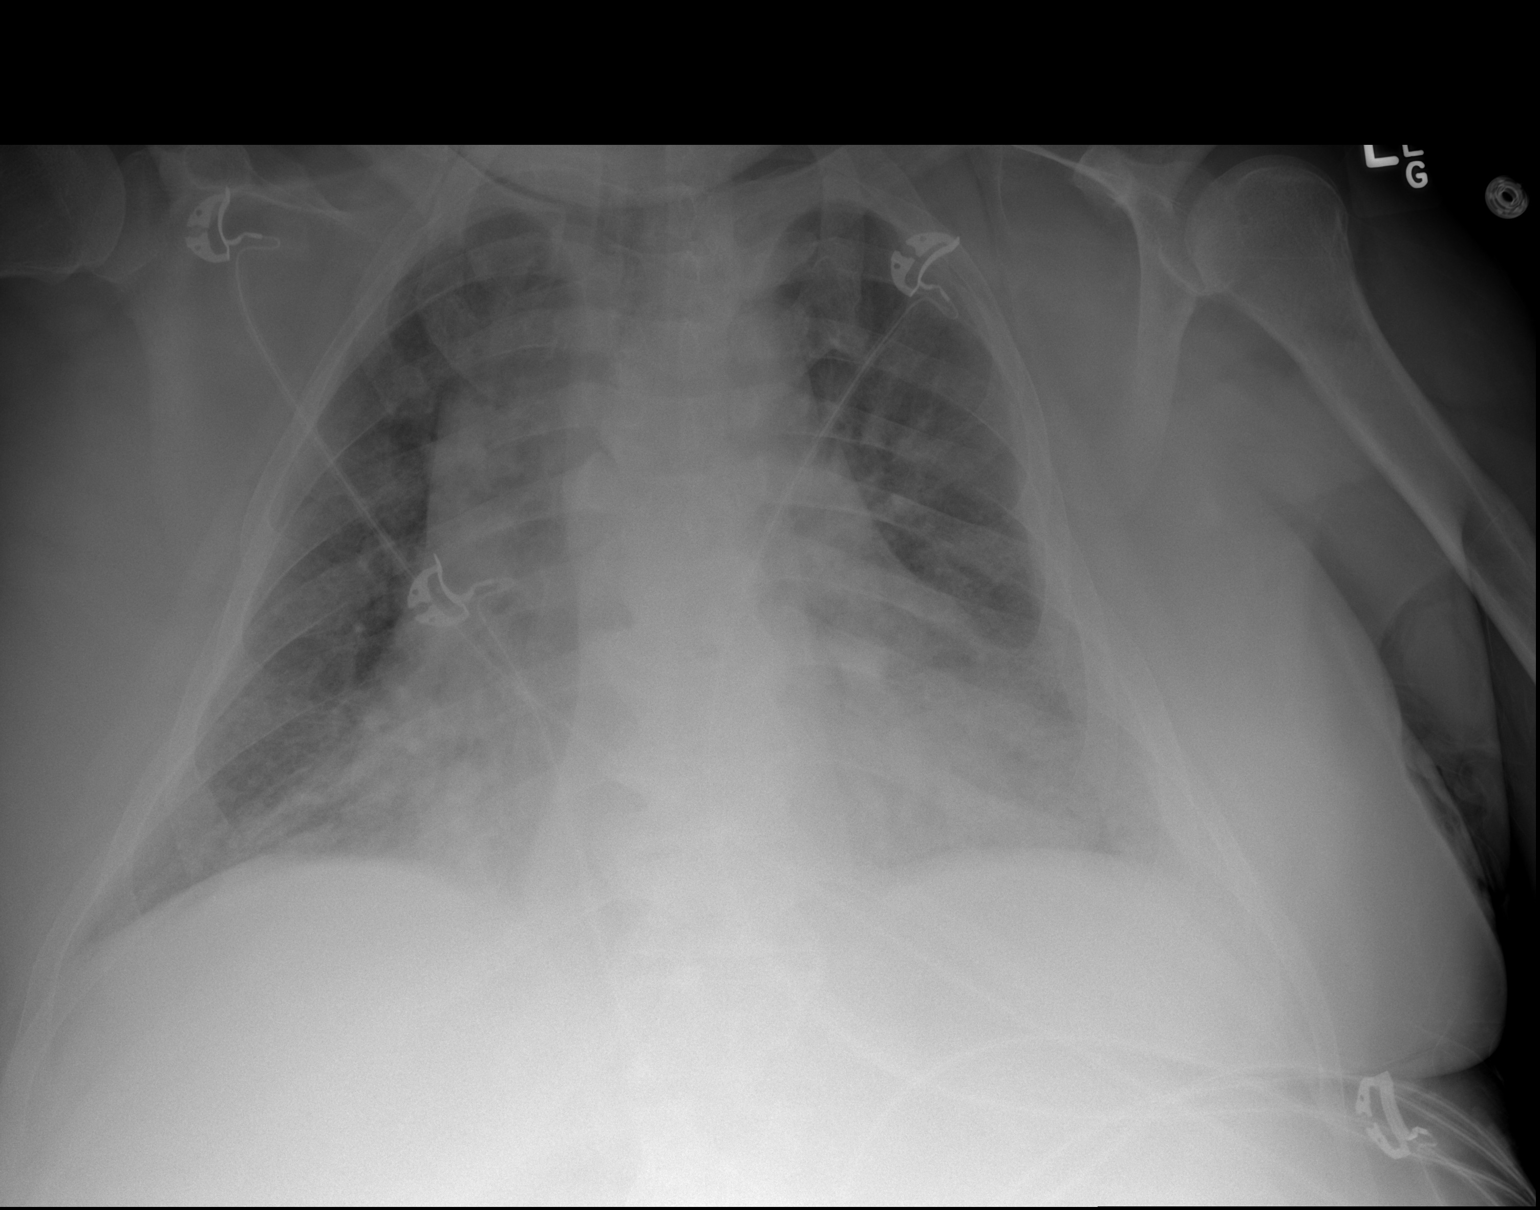

[w chest lat]
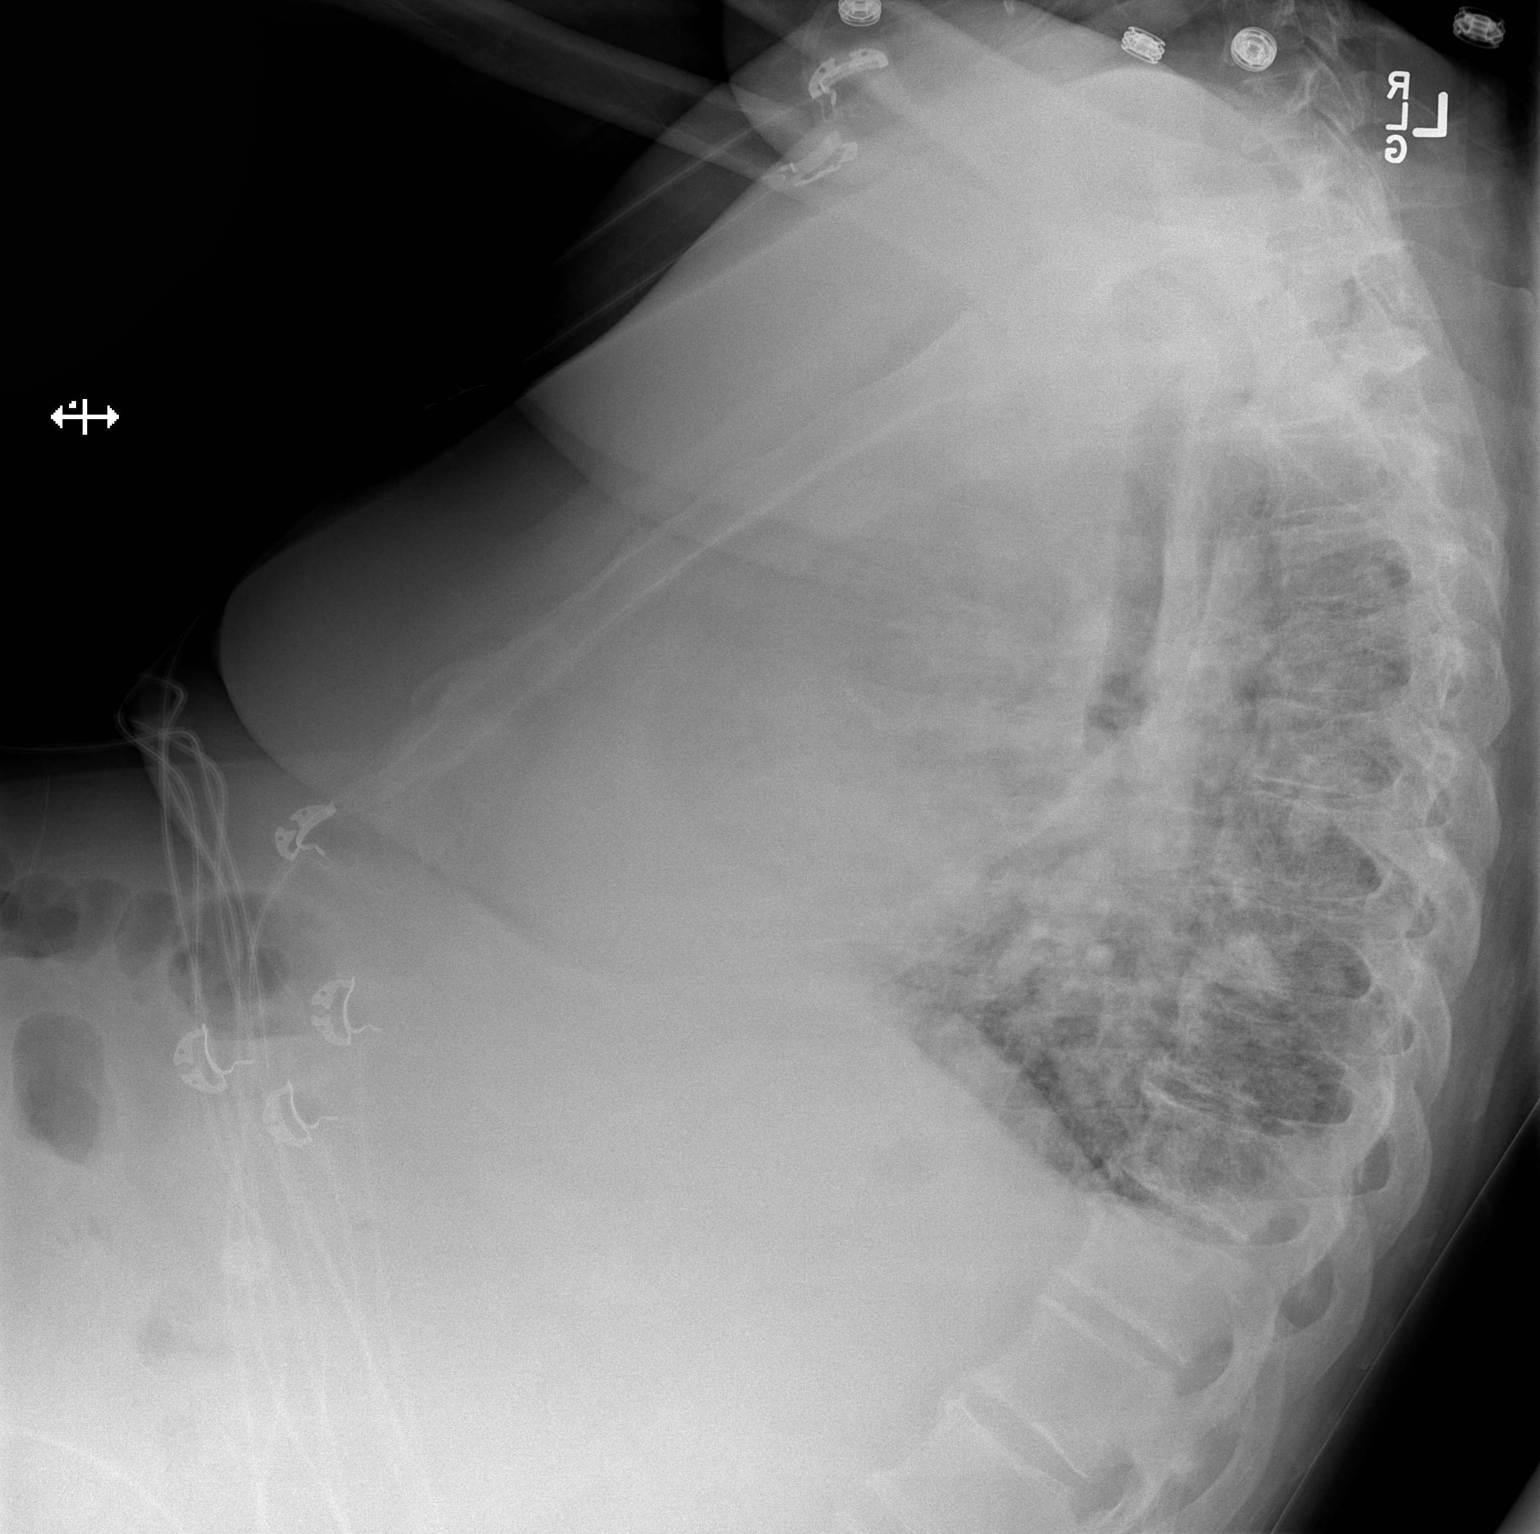

[2 of 2 positions shown; findings below may reference images not displayed]

FINDINGS: Cardiac silhouette is mild to moderately enlarged. Normal
mediastinal contours. No convincing hilar mass.

Lungs show mild vascular congestion. There is mild interstitial
thickening. Findings are less prominent than on the prior study.
This still may reflect mild congestive heart failure. No focal lung
consolidation is seen to suggest pneumonia. No pleural effusion or
pneumothorax. The bony thorax is intact.
IMPRESSION: Mild congestive heart failure is suspected. No evidence of
pneumonia.

## 2016-02-19 DIAGNOSIS — E1165 Type 2 diabetes mellitus with hyperglycemia: Secondary | ICD-10-CM | POA: Diagnosis not present

## 2016-02-19 DIAGNOSIS — E7800 Pure hypercholesterolemia, unspecified: Secondary | ICD-10-CM | POA: Diagnosis not present

## 2016-02-19 DIAGNOSIS — I5032 Chronic diastolic (congestive) heart failure: Secondary | ICD-10-CM | POA: Diagnosis not present

## 2016-02-19 DIAGNOSIS — K219 Gastro-esophageal reflux disease without esophagitis: Secondary | ICD-10-CM | POA: Diagnosis not present

## 2016-02-19 DIAGNOSIS — I10 Essential (primary) hypertension: Secondary | ICD-10-CM | POA: Diagnosis not present

## 2016-03-12 DIAGNOSIS — I5032 Chronic diastolic (congestive) heart failure: Secondary | ICD-10-CM | POA: Diagnosis not present

## 2016-03-12 DIAGNOSIS — R0902 Hypoxemia: Secondary | ICD-10-CM | POA: Diagnosis not present

## 2016-04-11 DIAGNOSIS — R0902 Hypoxemia: Secondary | ICD-10-CM | POA: Diagnosis not present

## 2016-04-11 DIAGNOSIS — I5032 Chronic diastolic (congestive) heart failure: Secondary | ICD-10-CM | POA: Diagnosis not present

## 2016-05-12 DIAGNOSIS — R0902 Hypoxemia: Secondary | ICD-10-CM | POA: Diagnosis not present

## 2016-05-12 DIAGNOSIS — I5032 Chronic diastolic (congestive) heart failure: Secondary | ICD-10-CM | POA: Diagnosis not present

## 2016-06-12 DIAGNOSIS — I5032 Chronic diastolic (congestive) heart failure: Secondary | ICD-10-CM | POA: Diagnosis not present

## 2016-06-12 DIAGNOSIS — R0902 Hypoxemia: Secondary | ICD-10-CM | POA: Diagnosis not present

## 2016-07-10 DIAGNOSIS — I5032 Chronic diastolic (congestive) heart failure: Secondary | ICD-10-CM | POA: Diagnosis not present

## 2016-07-10 DIAGNOSIS — R0902 Hypoxemia: Secondary | ICD-10-CM | POA: Diagnosis not present

## 2016-08-10 DIAGNOSIS — I5032 Chronic diastolic (congestive) heart failure: Secondary | ICD-10-CM | POA: Diagnosis not present

## 2016-08-10 DIAGNOSIS — R0902 Hypoxemia: Secondary | ICD-10-CM | POA: Diagnosis not present

## 2016-08-18 DIAGNOSIS — K219 Gastro-esophageal reflux disease without esophagitis: Secondary | ICD-10-CM | POA: Diagnosis not present

## 2016-08-18 DIAGNOSIS — J309 Allergic rhinitis, unspecified: Secondary | ICD-10-CM | POA: Diagnosis not present

## 2016-08-18 DIAGNOSIS — F419 Anxiety disorder, unspecified: Secondary | ICD-10-CM | POA: Diagnosis not present

## 2016-08-18 DIAGNOSIS — E7800 Pure hypercholesterolemia, unspecified: Secondary | ICD-10-CM | POA: Diagnosis not present

## 2016-08-18 DIAGNOSIS — L219 Seborrheic dermatitis, unspecified: Secondary | ICD-10-CM | POA: Diagnosis not present

## 2016-08-18 DIAGNOSIS — M1A09X0 Idiopathic chronic gout, multiple sites, without tophus (tophi): Secondary | ICD-10-CM | POA: Diagnosis not present

## 2016-08-18 DIAGNOSIS — E1165 Type 2 diabetes mellitus with hyperglycemia: Secondary | ICD-10-CM | POA: Diagnosis not present

## 2016-08-18 DIAGNOSIS — I5031 Acute diastolic (congestive) heart failure: Secondary | ICD-10-CM | POA: Diagnosis not present

## 2016-08-18 DIAGNOSIS — I110 Hypertensive heart disease with heart failure: Secondary | ICD-10-CM | POA: Diagnosis not present

## 2016-08-18 DIAGNOSIS — L209 Atopic dermatitis, unspecified: Secondary | ICD-10-CM | POA: Diagnosis not present

## 2016-08-18 DIAGNOSIS — N3289 Other specified disorders of bladder: Secondary | ICD-10-CM | POA: Diagnosis not present

## 2016-08-19 DIAGNOSIS — E1165 Type 2 diabetes mellitus with hyperglycemia: Secondary | ICD-10-CM | POA: Diagnosis not present

## 2016-08-19 DIAGNOSIS — I10 Essential (primary) hypertension: Secondary | ICD-10-CM | POA: Diagnosis not present

## 2016-08-19 DIAGNOSIS — E7800 Pure hypercholesterolemia, unspecified: Secondary | ICD-10-CM | POA: Diagnosis not present

## 2016-09-09 DIAGNOSIS — R0902 Hypoxemia: Secondary | ICD-10-CM | POA: Diagnosis not present

## 2016-09-09 DIAGNOSIS — R1033 Periumbilical pain: Secondary | ICD-10-CM | POA: Diagnosis not present

## 2016-09-09 DIAGNOSIS — I5032 Chronic diastolic (congestive) heart failure: Secondary | ICD-10-CM | POA: Diagnosis not present

## 2016-09-09 DIAGNOSIS — K8020 Calculus of gallbladder without cholecystitis without obstruction: Secondary | ICD-10-CM | POA: Diagnosis not present

## 2016-09-09 DIAGNOSIS — K5900 Constipation, unspecified: Secondary | ICD-10-CM | POA: Diagnosis not present

## 2016-09-13 DIAGNOSIS — K5901 Slow transit constipation: Secondary | ICD-10-CM | POA: Diagnosis not present

## 2016-09-13 DIAGNOSIS — R1033 Periumbilical pain: Secondary | ICD-10-CM | POA: Diagnosis not present

## 2016-10-10 DIAGNOSIS — I5032 Chronic diastolic (congestive) heart failure: Secondary | ICD-10-CM | POA: Diagnosis not present

## 2016-10-10 DIAGNOSIS — R0902 Hypoxemia: Secondary | ICD-10-CM | POA: Diagnosis not present

## 2016-10-14 DIAGNOSIS — R1033 Periumbilical pain: Secondary | ICD-10-CM | POA: Diagnosis not present

## 2016-10-14 DIAGNOSIS — K5901 Slow transit constipation: Secondary | ICD-10-CM | POA: Diagnosis not present

## 2016-11-09 DIAGNOSIS — R0902 Hypoxemia: Secondary | ICD-10-CM | POA: Diagnosis not present

## 2016-11-09 DIAGNOSIS — I5032 Chronic diastolic (congestive) heart failure: Secondary | ICD-10-CM | POA: Diagnosis not present

## 2016-12-10 DIAGNOSIS — R0902 Hypoxemia: Secondary | ICD-10-CM | POA: Diagnosis not present

## 2016-12-10 DIAGNOSIS — I5032 Chronic diastolic (congestive) heart failure: Secondary | ICD-10-CM | POA: Diagnosis not present

## 2017-01-10 DIAGNOSIS — I5032 Chronic diastolic (congestive) heart failure: Secondary | ICD-10-CM | POA: Diagnosis not present

## 2017-01-10 DIAGNOSIS — R0902 Hypoxemia: Secondary | ICD-10-CM | POA: Diagnosis not present

## 2017-02-09 DIAGNOSIS — R0902 Hypoxemia: Secondary | ICD-10-CM | POA: Diagnosis not present

## 2017-02-09 DIAGNOSIS — I5032 Chronic diastolic (congestive) heart failure: Secondary | ICD-10-CM | POA: Diagnosis not present

## 2017-02-14 DIAGNOSIS — I5031 Acute diastolic (congestive) heart failure: Secondary | ICD-10-CM | POA: Diagnosis not present

## 2017-02-14 DIAGNOSIS — I10 Essential (primary) hypertension: Secondary | ICD-10-CM | POA: Diagnosis not present

## 2017-02-14 DIAGNOSIS — E7800 Pure hypercholesterolemia, unspecified: Secondary | ICD-10-CM | POA: Diagnosis not present

## 2017-02-14 DIAGNOSIS — E1165 Type 2 diabetes mellitus with hyperglycemia: Secondary | ICD-10-CM | POA: Diagnosis not present

## 2017-02-14 DIAGNOSIS — I110 Hypertensive heart disease with heart failure: Secondary | ICD-10-CM | POA: Diagnosis not present

## 2017-03-12 DIAGNOSIS — R0902 Hypoxemia: Secondary | ICD-10-CM | POA: Diagnosis not present

## 2017-03-12 DIAGNOSIS — I5032 Chronic diastolic (congestive) heart failure: Secondary | ICD-10-CM | POA: Diagnosis not present

## 2017-04-11 DIAGNOSIS — R0902 Hypoxemia: Secondary | ICD-10-CM | POA: Diagnosis not present

## 2017-04-11 DIAGNOSIS — I5032 Chronic diastolic (congestive) heart failure: Secondary | ICD-10-CM | POA: Diagnosis not present

## 2017-05-12 DIAGNOSIS — I5032 Chronic diastolic (congestive) heart failure: Secondary | ICD-10-CM | POA: Diagnosis not present

## 2017-05-12 DIAGNOSIS — R0902 Hypoxemia: Secondary | ICD-10-CM | POA: Diagnosis not present

## 2017-06-12 DIAGNOSIS — I5032 Chronic diastolic (congestive) heart failure: Secondary | ICD-10-CM | POA: Diagnosis not present

## 2017-06-12 DIAGNOSIS — R0902 Hypoxemia: Secondary | ICD-10-CM | POA: Diagnosis not present

## 2017-06-17 ENCOUNTER — Other Ambulatory Visit: Payer: Self-pay | Admitting: Gastroenterology

## 2017-06-17 DIAGNOSIS — R1033 Periumbilical pain: Secondary | ICD-10-CM

## 2017-06-17 DIAGNOSIS — K5901 Slow transit constipation: Secondary | ICD-10-CM | POA: Diagnosis not present

## 2017-06-17 DIAGNOSIS — K210 Gastro-esophageal reflux disease with esophagitis: Secondary | ICD-10-CM | POA: Diagnosis not present

## 2017-06-24 ENCOUNTER — Ambulatory Visit
Admission: RE | Admit: 2017-06-24 | Discharge: 2017-06-24 | Disposition: A | Discharge status: Home / Self Care | Payer: PPO | Source: Ambulatory Visit | Attending: Gastroenterology | Admitting: Gastroenterology

## 2017-06-24 DIAGNOSIS — R1033 Periumbilical pain: Secondary | ICD-10-CM

## 2017-06-24 DIAGNOSIS — N202 Calculus of kidney with calculus of ureter: Secondary | ICD-10-CM | POA: Diagnosis not present

## 2017-07-10 DIAGNOSIS — I5032 Chronic diastolic (congestive) heart failure: Secondary | ICD-10-CM | POA: Diagnosis not present

## 2017-07-10 DIAGNOSIS — R0902 Hypoxemia: Secondary | ICD-10-CM | POA: Diagnosis not present

## 2017-07-18 DIAGNOSIS — I10 Essential (primary) hypertension: Secondary | ICD-10-CM | POA: Diagnosis not present

## 2017-07-18 DIAGNOSIS — R42 Dizziness and giddiness: Secondary | ICD-10-CM | POA: Diagnosis not present

## 2017-07-18 DIAGNOSIS — E7800 Pure hypercholesterolemia, unspecified: Secondary | ICD-10-CM | POA: Diagnosis not present

## 2017-07-24 ENCOUNTER — Emergency Department (HOSPITAL_COMMUNITY): Payer: PPO

## 2017-07-24 ENCOUNTER — Inpatient Hospital Stay (HOSPITAL_COMMUNITY)
Admission: EM | Admit: 2017-07-24 | Discharge: 2017-07-28 | DRG: 369 | Disposition: A | Discharge status: Home / Self Care | Payer: PPO | Attending: Internal Medicine | Admitting: Internal Medicine

## 2017-07-24 ENCOUNTER — Encounter (HOSPITAL_COMMUNITY): Payer: Self-pay

## 2017-07-24 DIAGNOSIS — K8020 Calculus of gallbladder without cholecystitis without obstruction: Secondary | ICD-10-CM | POA: Diagnosis not present

## 2017-07-24 DIAGNOSIS — N202 Calculus of kidney with calculus of ureter: Secondary | ICD-10-CM | POA: Diagnosis present

## 2017-07-24 DIAGNOSIS — E785 Hyperlipidemia, unspecified: Secondary | ICD-10-CM | POA: Diagnosis present

## 2017-07-24 DIAGNOSIS — K219 Gastro-esophageal reflux disease without esophagitis: Secondary | ICD-10-CM | POA: Diagnosis present

## 2017-07-24 DIAGNOSIS — K922 Gastrointestinal hemorrhage, unspecified: Secondary | ICD-10-CM

## 2017-07-24 DIAGNOSIS — Z6838 Body mass index (BMI) 38.0-38.9, adult: Secondary | ICD-10-CM | POA: Diagnosis not present

## 2017-07-24 DIAGNOSIS — Z833 Family history of diabetes mellitus: Secondary | ICD-10-CM | POA: Diagnosis not present

## 2017-07-24 DIAGNOSIS — K921 Melena: Secondary | ICD-10-CM | POA: Diagnosis present

## 2017-07-24 DIAGNOSIS — K274 Chronic or unspecified peptic ulcer, site unspecified, with hemorrhage: Secondary | ICD-10-CM | POA: Diagnosis not present

## 2017-07-24 DIAGNOSIS — D649 Anemia, unspecified: Secondary | ICD-10-CM | POA: Diagnosis not present

## 2017-07-24 DIAGNOSIS — K2270 Barrett's esophagus without dysplasia: Secondary | ICD-10-CM | POA: Diagnosis not present

## 2017-07-24 DIAGNOSIS — D62 Acute posthemorrhagic anemia: Secondary | ICD-10-CM | POA: Diagnosis not present

## 2017-07-24 DIAGNOSIS — R6250 Unspecified lack of expected normal physiological development in childhood: Secondary | ICD-10-CM | POA: Diagnosis not present

## 2017-07-24 DIAGNOSIS — K920 Hematemesis: Secondary | ICD-10-CM

## 2017-07-24 DIAGNOSIS — Z9981 Dependence on supplemental oxygen: Secondary | ICD-10-CM | POA: Diagnosis not present

## 2017-07-24 DIAGNOSIS — F419 Anxiety disorder, unspecified: Secondary | ICD-10-CM | POA: Diagnosis not present

## 2017-07-24 DIAGNOSIS — E669 Obesity, unspecified: Secondary | ICD-10-CM | POA: Diagnosis not present

## 2017-07-24 DIAGNOSIS — R195 Other fecal abnormalities: Secondary | ICD-10-CM | POA: Diagnosis not present

## 2017-07-24 DIAGNOSIS — F99 Mental disorder, not otherwise specified: Secondary | ICD-10-CM

## 2017-07-24 DIAGNOSIS — E119 Type 2 diabetes mellitus without complications: Secondary | ICD-10-CM | POA: Diagnosis not present

## 2017-07-24 DIAGNOSIS — J9611 Chronic respiratory failure with hypoxia: Secondary | ICD-10-CM | POA: Diagnosis not present

## 2017-07-24 DIAGNOSIS — K226 Gastro-esophageal laceration-hemorrhage syndrome: Principal | ICD-10-CM | POA: Diagnosis present

## 2017-07-24 DIAGNOSIS — G4730 Sleep apnea, unspecified: Secondary | ICD-10-CM | POA: Diagnosis not present

## 2017-07-24 DIAGNOSIS — Z885 Allergy status to narcotic agent status: Secondary | ICD-10-CM | POA: Diagnosis not present

## 2017-07-24 DIAGNOSIS — N179 Acute kidney failure, unspecified: Secondary | ICD-10-CM | POA: Diagnosis not present

## 2017-07-24 DIAGNOSIS — N200 Calculus of kidney: Secondary | ICD-10-CM | POA: Diagnosis not present

## 2017-07-24 DIAGNOSIS — I5032 Chronic diastolic (congestive) heart failure: Secondary | ICD-10-CM | POA: Diagnosis present

## 2017-07-24 DIAGNOSIS — Z79899 Other long term (current) drug therapy: Secondary | ICD-10-CM

## 2017-07-24 DIAGNOSIS — G4736 Sleep related hypoventilation in conditions classified elsewhere: Secondary | ICD-10-CM | POA: Diagnosis not present

## 2017-07-24 DIAGNOSIS — R110 Nausea: Secondary | ICD-10-CM | POA: Diagnosis not present

## 2017-07-24 DIAGNOSIS — I110 Hypertensive heart disease with heart failure: Secondary | ICD-10-CM | POA: Diagnosis not present

## 2017-07-24 DIAGNOSIS — J810 Acute pulmonary edema: Secondary | ICD-10-CM | POA: Diagnosis not present

## 2017-07-24 DIAGNOSIS — K2971 Gastritis, unspecified, with bleeding: Secondary | ICD-10-CM | POA: Diagnosis not present

## 2017-07-24 DIAGNOSIS — J9621 Acute and chronic respiratory failure with hypoxia: Secondary | ICD-10-CM

## 2017-07-24 DIAGNOSIS — Z8249 Family history of ischemic heart disease and other diseases of the circulatory system: Secondary | ICD-10-CM | POA: Diagnosis not present

## 2017-07-24 DIAGNOSIS — D500 Iron deficiency anemia secondary to blood loss (chronic): Secondary | ICD-10-CM | POA: Diagnosis not present

## 2017-07-24 DIAGNOSIS — N3289 Other specified disorders of bladder: Secondary | ICD-10-CM | POA: Diagnosis not present

## 2017-07-24 DIAGNOSIS — I5030 Unspecified diastolic (congestive) heart failure: Secondary | ICD-10-CM | POA: Diagnosis not present

## 2017-07-24 DIAGNOSIS — R252 Cramp and spasm: Secondary | ICD-10-CM | POA: Diagnosis not present

## 2017-07-24 DIAGNOSIS — Z7984 Long term (current) use of oral hypoglycemic drugs: Secondary | ICD-10-CM

## 2017-07-24 LAB — URINALYSIS, ROUTINE W REFLEX MICROSCOPIC
BILIRUBIN URINE: NEGATIVE
Glucose, UA: NEGATIVE mg/dL
HGB URINE DIPSTICK: NEGATIVE
KETONES UR: NEGATIVE mg/dL
Leukocytes, UA: NEGATIVE
Nitrite: NEGATIVE
Protein, ur: NEGATIVE mg/dL
SPECIFIC GRAVITY, URINE: 1.01 (ref 1.005–1.030)
pH: 5 (ref 5.0–8.0)

## 2017-07-24 LAB — TYPE AND SCREEN
ABO/RH(D): O POS
ANTIBODY SCREEN: NEGATIVE

## 2017-07-24 LAB — COMPREHENSIVE METABOLIC PANEL
ALT: 16 U/L — ABNORMAL LOW (ref 17–63)
ANION GAP: 15 (ref 5–15)
AST: 22 U/L (ref 15–41)
Albumin: 3 g/dL — ABNORMAL LOW (ref 3.5–5.0)
Alkaline Phosphatase: 84 U/L (ref 38–126)
BILIRUBIN TOTAL: 0.8 mg/dL (ref 0.3–1.2)
BUN: 43 mg/dL — AB (ref 6–20)
CHLORIDE: 90 mmol/L — AB (ref 101–111)
CO2: 30 mmol/L (ref 22–32)
Calcium: 8.3 mg/dL — ABNORMAL LOW (ref 8.9–10.3)
Creatinine, Ser: 1.6 mg/dL — ABNORMAL HIGH (ref 0.61–1.24)
GFR calc non Af Amer: 47 mL/min — ABNORMAL LOW (ref >60)
GFR, EST AFRICAN AMERICAN: 54 mL/min — AB (ref >60)
Glucose, Bld: 150 mg/dL — ABNORMAL HIGH (ref 65–99)
POTASSIUM: 4.6 mmol/L (ref 3.5–5.1)
Sodium: 135 mmol/L (ref 135–145)
TOTAL PROTEIN: 6.4 g/dL — AB (ref 6.5–8.1)

## 2017-07-24 LAB — CBC
HEMATOCRIT: 38.1 % — AB (ref 39.0–52.0)
HEMOGLOBIN: 10.8 g/dL — AB (ref 13.0–17.0)
MCH: 22.6 pg — ABNORMAL LOW (ref 26.0–34.0)
MCHC: 28.3 g/dL — ABNORMAL LOW (ref 30.0–36.0)
MCV: 79.9 fL (ref 78.0–100.0)
Platelets: 204 10*3/uL (ref 150–400)
RBC: 4.77 MIL/uL (ref 4.22–5.81)
RDW: 18.9 % — AB (ref 11.5–15.5)
WBC: 12.5 10*3/uL — AB (ref 4.0–10.5)

## 2017-07-24 LAB — GLUCOSE, CAPILLARY: GLUCOSE-CAPILLARY: 159 mg/dL — AB (ref 65–99)

## 2017-07-24 LAB — ABO/RH: ABO/RH(D): O POS

## 2017-07-24 LAB — POC OCCULT BLOOD, ED: FECAL OCCULT BLD: POSITIVE — AB

## 2017-07-24 MED ORDER — ACETAMINOPHEN 650 MG RE SUPP: 650.0000 mg | Freq: Four times a day (QID) | RECTAL | Status: DC | PRN

## 2017-07-24 MED ORDER — INSULIN ASPART 100 UNIT/ML ~~LOC~~ SOLN
0.0000 [IU] | Freq: Three times a day (TID) | SUBCUTANEOUS | Status: DC
  Administered 2017-07-25: 2 [IU] via SUBCUTANEOUS
  Administered 2017-07-25: 1 [IU] via SUBCUTANEOUS
  Administered 2017-07-25 – 2017-07-27 (×4): 3 [IU] via SUBCUTANEOUS
  Administered 2017-07-27 – 2017-07-28 (×2): 2 [IU] via SUBCUTANEOUS
  Administered 2017-07-28: 3 [IU] via SUBCUTANEOUS

## 2017-07-24 MED ORDER — SODIUM CHLORIDE 0.9 % IV SOLN
8.0000 mg/h | INTRAVENOUS | Status: DC
  Administered 2017-07-24 – 2017-07-26 (×3): 8 mg/h via INTRAVENOUS
  Filled 2017-07-24 (×7): qty 80

## 2017-07-24 MED ORDER — ACETAMINOPHEN 325 MG PO TABS
650.0000 mg | ORAL_TABLET | Freq: Four times a day (QID) | ORAL | Status: DC | PRN
  Administered 2017-07-24 – 2017-07-28 (×2): 650 mg via ORAL
  Filled 2017-07-24 (×2): qty 2

## 2017-07-24 MED ORDER — PANTOPRAZOLE SODIUM 40 MG IV SOLR
40.0000 mg | Freq: Once | INTRAVENOUS | Status: AC
  Administered 2017-07-24: 40 mg via INTRAVENOUS
  Filled 2017-07-24: qty 40

## 2017-07-24 NOTE — ED Notes (Signed)
XR at bedside

## 2017-07-24 NOTE — ED Notes (Signed)
ED Provider at bedside. 

## 2017-07-24 NOTE — ED Notes (Signed)
Patient transported to CT 

## 2017-07-24 NOTE — Plan of Care (Signed)
Progressing

## 2017-07-24 NOTE — ED Provider Notes (Signed)
[facility] EMERGENCY DEPARTMENT Provider Note   CSN: [id number] Arrival date & time: 07/24/17  1532    History   Chief Complaint Chief Complaint  Patient presents with  . Hematemesis    HPI Tony Jones is a 57 y.o. male who presents with hematemesis. PMH significant for CHF on 3L O2 24/7, HTN, HLD, Barrett esophagus, GERD, Type 2 DM, MR. His mother and brother are at bedside who provide most of history. They state that the patient has had intermittent dizzy spells for the past 2.5 weeks. He has had nausea and vomiting with the dizziness. Mother notes that his blood sugars are normal during the episodes. He also has complained of abdominal pain. They took him to GI who got a CT of abdomen/pelvis which showed a 96mm gallstone, a 47mm unobstructing right sided kidney stone, two 6-47mm distal right ureteral stones at the UVJ. He was put on a stool softener. They took him to his PCP a week ago for recurrent nausea. They diagnosed him with vertigo. They noted that his blood pressure was low (01U systolic). His Lasix dose was reduced and he was started on Meclizine. This seemed to help some. This morning he had an acute onset of severe dizziness and he started vomiting gross blood. His mother brought him to the ED. She forgot to turn his O2 on the way here so he was hypoxic to 68% however once O2 was turned on it was back up to 91% which is his baseline. The patient denies any pain. He denies any current dizziness or nausea. He denies chest pain, SOB, current abdominal pain, diarrhea, urinary symptoms, worsening leg swelling. He is not on blood thinners. No NSAID or ETOH use  HPI  Past Medical History:  Diagnosis Date  . Anxiety   . Barrett esophagus   . Diabetes mellitus type 2, uncontrolled ([place])   . Dyslipidemia   . GERD (gastroesophageal reflux disease)   . Hypertension   . Mental disorder     Patient Active Problem List   Diagnosis Date Noted  . Hypoxemia 09/17/2015    . Moderate obesity 09/17/2015  . Thrombocytopenia ([place]) 02/02/2015  . Chronic diastolic heart failure ([place]) 01/29/2015  . Diabetes mellitus type 2, controlled ([place]) 01/29/2015  . Acute diastolic CHF (congestive heart failure) ([place]) 09/26/2013  . Acute respiratory failure with hypoxia ([place]) 09/26/2013  . Acute CHF ([place]) 09/26/2013  . Mental disorder   . Diabetes mellitus type 2, uncontrolled ([place])   . Hypertension   . Dyslipidemia     Past Surgical History:  Procedure Laterality Date  . THROAT SURGERY          Home Medications    Prior to Admission medications   Medication Sig Start Date End Date Taking? Authorizing Provider  acetaminophen (TYLENOL) 500 MG tablet Take 500 mg by mouth every 6 (six) hours as needed for mild pain.    [provider]  allopurinol (ZYLOPRIM) 300 MG tablet Take 300 mg by mouth daily. 01/18/15   [provider]  atorvastatin (LIPITOR) 20 MG tablet Take 20 mg by mouth daily.    [provider]  carvedilol (COREG) 3.125 MG tablet Take 1 tablet (3.125 mg total) by mouth 2 (two) times daily with a meal. 02/03/15   Tat, Shanon Brow, MD  fesoterodine (TOVIAZ) 8 MG TB24 tablet Take 8 mg by mouth daily.    [provider]  furosemide (LASIX) 80 MG tablet Take 80 mg by mouth daily.  [provider]  glimepiride (AMARYL) 2 MG tablet Take 2 mg by mouth daily with breakfast.    [provider]  levocetirizine (XYZAL) 5 MG tablet Take 5 mg by mouth every evening.    [provider]  LORazepam (ATIVAN) 0.5 MG tablet Take 0.5 mg by mouth every 8 (eight) hours as needed for anxiety.    [provider]  metFORMIN (GLUCOPHAGE) 500 MG tablet Take 2 tablets (1,000 mg total) by mouth 2 (two) times daily with a meal. 09/29/13   Hongalgi, Lenis Dickinson, MD  oxybutynin (DITROPAN) 5 MG tablet Take 5 mg by mouth 2 (two) times daily.    [provider]  pantoprazole (PROTONIX) 40 MG tablet Take 40 mg by mouth 2  (two) times daily.    [provider]  sertraline (ZOLOFT) 50 MG tablet Take 50 mg by mouth daily.    [provider]  triamcinolone cream (KENALOG) 0.1 % Apply 1 application topically daily as needed (rash to back, face, neck, back of knees.).    [provider]  vitamin B-12 (CYANOCOBALAMIN) 500 MCG tablet Take 2 tablets (1,000 mcg total) by mouth daily. 02/03/15   Orson Eva, MD    Family History Family History  Problem Relation Age of Onset  . Congestive Heart Failure Father   . Heart disease Father   . Heart attack Father   . Atrial fibrillation Father   . Brain cancer Father   . Melanoma Father   . Diabetes Mother   . Stroke Mother   . Hypertension Mother   . Fainting Paternal Grandfather   . Congestive Heart Failure Paternal Grandfather   . Atrial fibrillation Paternal Grandfather     Social History Social History   Tobacco Use  . Smoking status: Never Smoker  . Smokeless tobacco: Never Used  Substance Use Topics  . Alcohol use: No  . Drug use: No     Allergies   Codeine   Review of Systems Review of Systems  Constitutional: Negative for fever.  Respiratory: Negative for cough and shortness of breath.   Cardiovascular: Negative for chest pain.  Gastrointestinal: Positive for abdominal pain, nausea and vomiting. Negative for constipation.  Genitourinary: Negative for dysuria.  Neurological: Positive for dizziness.     Physical Exam Updated Vital Signs BP 109/73   Pulse (!) 103   Temp 98.1 F (36.7 C) (Oral)   Resp 20   SpO2 90%   Physical Exam  Constitutional: He is oriented to person, place, and time. He appears well-developed and well-nourished. No distress.  Chronically ill appearing, cooperative  HENT:  Head: Normocephalic and atraumatic.  Right ear impacted  Eyes: Pupils are equal, round, and reactive to light. Conjunctivae are normal. Right eye exhibits no discharge. Left eye exhibits no discharge. No scleral  icterus.  Neck: Normal range of motion.  Cardiovascular: Tachycardia present. Exam reveals no gallop and no friction rub.  No murmur heard. Pulmonary/Chest: Effort normal and breath sounds normal. No stridor. No respiratory distress. He has no wheezes. He has no rales. He exhibits no tenderness.  Abdominal: Soft. Bowel sounds are normal. He exhibits no distension. There is tenderness (generalized).  Musculoskeletal:  1+ bilateral peripheral edema  Neurological: He is alert and oriented to person, place, and time.  Unable to cooperative with EOM and finger to nose testing  Skin: Skin is warm and dry.  Psychiatric: He has a normal mood and affect. His behavior is normal.  Nursing note and vitals reviewed.  ED Treatments / Results  Labs (all labs ordered are listed, but only abnormal results are displayed) Labs Reviewed  COMPREHENSIVE METABOLIC PANEL - Abnormal; Notable for the following components:      Result Value   Chloride 90 (*)    Glucose, Bld 150 (*)    BUN 43 (*)    Creatinine, Ser 1.60 (*)    Calcium 8.3 (*)    Total Protein 6.4 (*)    Albumin 3.0 (*)    ALT 16 (*)    GFR calc non Af Amer 47 (*)    GFR calc Af Amer 54 (*)    All other components within normal limits  CBC - Abnormal; Notable for the following components:   WBC 12.5 (*)    Hemoglobin 10.8 (*)    HCT 38.1 (*)    MCH 22.6 (*)    MCHC 28.3 (*)    RDW 18.9 (*)    All other components within normal limits  URINALYSIS, ROUTINE W REFLEX MICROSCOPIC  POC OCCULT BLOOD, ED  TYPE AND SCREEN  ABO/RH    EKG None  Radiology Dg Chest Port 1 View  Result Date: 07/24/2017 CLINICAL DATA:  Cyanosis.  Decreased O2 sats. EXAM: PORTABLE CHEST 1 VIEW COMPARISON:  02/02/2015 FINDINGS: Lungs are hyperexpanded. The cardio pericardial silhouette is enlarged. Vascular congestion with interstitial pulmonary edema pattern and basilar airspace opacity likely edema related. No substantial pleural effusion. The visualized  bony structures of the thorax are intact. Telemetry leads overlie the chest. IMPRESSION: Cardiomegaly with pulmonary edema pattern. Electronically Signed   By: Misty Stanley M.D.   On: 07/24/2017 16:54    Procedures Procedures (including critical care time)  Medications Ordered in ED Medications  pantoprazole (PROTONIX) 80 mg in sodium chloride 0.9 % 250 mL (0.32 mg/mL) infusion (has no administration in time range)  pantoprazole (PROTONIX) injection 40 mg (40 mg Intravenous Given 07/24/17 1718)     Initial Impression / Assessment and Plan / ED Course  I have reviewed the triage vital signs and the nursing notes.  Pertinent labs & imaging results that were available during my care of the patient were reviewed by me and considered in my medical decision making (see chart for details).  57 year old male with recurrent and intermittent dizziness, nausea, vomiting and hematemesis starting today.  He is mildly tachycardic and blood pressures are soft.  He was hypoxic on arrival to the ED however this was because he was not hooked up to his oxygen which is supposed to be continuous.  After restarting his oxygen his levels are at his baseline.  On exam the patient states that all his symptoms have resolved.  His heart rate is tachycardic regular rhythm.  His lungs are clear to auscultation.  He has generalized tenderness of his abdomen.  His orthostatics are negative.  Orthostatic VS for the past 24 hrs:  BP- Lying Pulse- Lying BP- Sitting Pulse- Sitting BP- Standing at 0 minutes Pulse- Standing at 0 minutes  07/24/17 1647 102/67 104 107/89 105 101/60 106   CBC is remarkable for mild leukocytosis 12.5.  His hemoglobin is 10.8 today which has dropped however I have no recent value to compare this to.  CMP is remarkable for elevated BUN and creatinine suggestive of upper GI bleed.  Hemoccult is positive.  UA is normal. CXR shows cardiomegaly with pulmonary edema. Discussed with Dr. Rogene Houston.  Will  obtain CT renal, start Protonix, consult [facility].  5:42 PM GI request Protonix drip, NPO  after midnight. They will see in the morning. CT is overall unchanged from 2/22.  6:09 PM Spoke with IM team who will admit.    Final Clinical Impressions(s) / ED Diagnoses   Final diagnoses:  Hematemesis, presence of nausea not specified  Upper GI bleed    ED Discharge Orders    None       Recardo Evangelist, PA-C 07/24/17 1916    Fredia Sorrow, MD 07/27/17 [phone]

## 2017-07-24 NOTE — ED Triage Notes (Signed)
Patient arrived pale and cyanotic to triage room. Patient actively spitting up frank blood that started this afternoon. Patient arrived with oxygen sats 65% to triage. Oxygen portable that patient uses was turned off. Patient alert and no acute distress. Placed on wall oxygen at 3L and sats 80-89%

## 2017-07-24 NOTE — ED Notes (Signed)
Admitting at the bedside.  

## 2017-07-24 NOTE — ED Notes (Signed)
Dr Pearline Cables paged again to this RN phone

## 2017-07-24 NOTE — ED Notes (Signed)
EDP aware of pt BP 

## 2017-07-24 NOTE — ED Notes (Signed)
Pt and family updated to delay, awaiting admission orders. Family notified bed request has been placed. Pt resting comfortably at this time

## 2017-07-24 NOTE — ED Notes (Signed)
Per family, at baseline pt O2 sat runs at approx 85%. Pt on 3L [place] at this time, when pt O2 sat low, pt instructed by this RN and family to take a few deep breaths through his nose. O2 sat increases to approx 90-93%

## 2017-07-24 NOTE — H&P (Signed)
Date: 07/24/2017               Patient Name:  Tony Jones MRN: [id number]  DOB: Jun 12, 1960 Age / Sex: 57 y.o., male   PCP: Christain Sacramento, MD         Medical Service: Internal Medicine Teaching Service         Attending Physician: Dr. Lucious Groves, DO    First Contact: Dr. Ronalee Red Pager: [phone]  Second Contact: Dr. Jari Favre Pager: [phone]       After Hours (After 5p/  First Contact Pager: [phone]  weekends / holidays): Second Contact Pager: [phone]   Chief Complaint: hematemesis  History of Present Illness:  Mr. Syme is a 57yo male with PMH of developmental delay, GERD, Barrett esophagus, hx of esophageal strictures, diastolic heart failure, moderate obesity (on chronic home oxygen), DM2, and HTN who presents with an episode of hematemesis. Two brothers at bedside, who assist with the history.  Approximately 2 weeks ago, patient started having episodes of lower abdominal pain, nausea, vomiting, and dizziness. He was seen by his PCP where he was diagnosed with kidney stones and cholelithiasis. He had outpatient management of these issues and was doing okay until this morning around 3am, when he had an episode of ~100cc bright red emesis. No other episodes of hematemesis, hematochezia or melena prior to this episode, although he did have a "pink" stool last night. No excessive use of aspirin or other NSAIDs. Patient denies chest pain, dizziness or lightheadedness, abdominal pain, nausea, or dysuria. Brothers state he has been more weak recently although not dyspneic.  He follows with Dr. Watt Climes of [facility] and has had multiple EGDs for strictures. Most recently, he had an EGD in 2013, which showed small hiatal hernia, no obvious Barrett's, gastritis, small gastric polyps and 2 antral nodules. Esophageal biopsies showed normal squamous mucosa with possible reflux related injury. Stomach biopsies showed eroded reactive gastropathy, negative for H. Pylori. He has never had a colonoscopy  before. In 2010, he had a biopsy that was positive for Barrett's with low grade dysplasia.  ED Course: - BP 109/73, HR 100, RR 19, temp 98.1, O2 91% on 2L [place] - WBC 12.5, Hb 10.8. Cr 1.60. FOBT positive. UA negative for UTI - CXR with cardiomegaly and pulmonary edema. CT renal stone study with cholelithiasis, non-obstructing right lower pole nephrolithiasis, and two right UVJ stones without significant obstruction. - Received IV protonix  Meds:  Current Meds  Medication Sig  . acetaminophen (TYLENOL) 650 MG CR tablet Take 1,300 mg by mouth daily as needed for pain.  Marland Kitchen allopurinol (ZYLOPRIM) 300 MG tablet Take 300 mg by mouth daily with supper.   . carvedilol (COREG) 3.125 MG tablet Take 1 tablet (3.125 mg total) by mouth 2 (two) times daily with a meal.  . docusate sodium (COLACE) 100 MG capsule Take 100 mg by mouth at bedtime.  . fesoterodine (TOVIAZ) 8 MG TB24 tablet Take 8 mg by mouth daily with supper.   . furosemide (LASIX) 80 MG tablet Take 120 mg by mouth daily with breakfast.   . glimepiride (AMARYL) 2 MG tablet Take 2 mg by mouth daily with breakfast.  . levocetirizine (XYZAL) 5 MG tablet Take 5 mg by mouth daily with breakfast.   . meclizine (ANTIVERT) 25 MG tablet Take 25 mg by mouth 3 (three) times daily as needed for dizziness.   . metFORMIN (GLUCOPHAGE) 1000 MG tablet Take 1,000 mg by mouth 2 (two)  times daily with a meal.  . Multiple Vitamin (MULTIVITAMIN WITH MINERALS) TABS tablet Take 1 tablet by mouth daily with breakfast.   . OVER THE COUNTER MEDICATION Place 1 drop into both eyes daily as needed (dry eyes). Over the counter lubricating eye drop  . oxybutynin (DITROPAN) 5 MG tablet Take 5 mg by mouth 2 (two) times daily with a meal.   . OXYGEN Inhale 3 L into the lungs continuous.  . pantoprazole (PROTONIX) 40 MG tablet Take 40 mg by mouth 2 (two) times daily with a meal.   . sertraline (ZOLOFT) 100 MG tablet Take 100 mg by mouth daily with breakfast.  . tiZANidine  (ZANAFLEX) 2 MG tablet Take 1 mg by mouth every 6 (six) hours as needed (leg cramps).    Allergies: Allergies as of 07/24/2017 - Review Complete 07/24/2017  Allergen Reaction Noted  . Codeine Nausea And Vomiting 09/09/2015   Past Medical History:  Diagnosis Date  . Anxiety   . Barrett esophagus   . Diabetes mellitus type 2, uncontrolled ([place])   . Dyslipidemia   . GERD (gastroesophageal reflux disease)   . Hypertension   . Mental disorder    Family History:  Family History  Problem Relation Age of Onset  . Congestive Heart Failure Father   . Heart disease Father   . Heart attack Father   . Atrial fibrillation Father   . Brain cancer Father   . Melanoma Father   . Diabetes Mother   . Stroke Mother   . Hypertension Mother   . Fainting Paternal Grandfather   . Congestive Heart Failure Paternal Grandfather   . Atrial fibrillation Paternal Grandfather    Social History:  - denies smoking, alcohol use, or other illicit drugs - lives at home with his mother  Review of Systems: A complete ROS was negative except as per HPI.  Physical Exam: Blood pressure 97/60, pulse (!) 105, temperature 98.1 F (36.7 C), temperature source Oral, resp. rate 20, SpO2 93 %.  GEN: Well-appearing obese male lying in bed. Alert and oriented. No acute distress. HENT: [place]/AT. Moist mucous membranes. No visible lesions. EYES: PERRL. Sclera non-icteric. Conjunctiva clear. RESP: Clear to auscultation bilaterally. No wheezes, rales, or rhonchi. No increased work of breathing, on [place]. CV: Normal rate and regular rhythm. No murmurs, gallops, or rubs. No JVD elevation. 1+ LE edema. ABD: Soft. Obese. Non-tender. Non-distended. Normoactive bowel sounds. EXT: 1+ edema. Warm and well perfused. NEURO: Cranial nerves II-XII grossly intact. Able to lift all four extremities against gravity. No apparent audiovisual hallucinations. Speech fluent and appropriate. PSYCH: Patient is calm and pleasant. Appropriate  affect. Well-groomed; speech is appropriate and on-subject.  Labs CBC Latest Ref Rng & Units 07/24/2017 01/30/2015 01/29/2015  WBC 4.0 - 10.5 K/uL 12.5(H) 8.2 9.4  Hemoglobin 13.0 - 17.0 g/dL 10.8(L) 13.4 13.9  Hematocrit 39.0 - 52.0 % 38.1(L) 44.2 44.8  Platelets 150 - 400 K/uL 204 147(L) 146(L)   CMP Latest Ref Rng & Units 07/24/2017 02/03/2015 02/02/2015  Glucose 65 - 99 mg/dL 150(H) 133(H) 140(H)  BUN 6 - 20 mg/dL 43(H) 20 24(H)  Creatinine 0.61 - 1.24 mg/dL 1.60(H) 1.21 1.18  Sodium 135 - 145 mmol/L 135 136 137  Potassium 3.5 - 5.1 mmol/L 4.6 3.4(L) 3.7  Chloride 101 - 111 mmol/L 90(L) 92(L) 91(L)  CO2 22 - 32 mmol/L 30 36(H) 37(H)  Calcium 8.9 - 10.3 mg/dL 8.3(L) 8.2(L) 8.3(L)  Total Protein 6.5 - 8.1 g/dL 6.4(L) - -  Total Bilirubin  0.3 - 1.2 mg/dL 0.8 - -  Alkaline Phos 38 - 126 U/L 84 - -  AST 15 - 41 U/L 22 - -  ALT 17 - 63 U/L 16(L) - -   FOBT positive UA negative  CXR: personally reviewed my interpretation is cardiomegaly, pulmonary edema  CT renal stone Cholelithiasis without complicating factors. Nonobstructing right lower pole renal stone. Two right UVJ stones without significant obstructive change. The overall appearance is stable. No new focal abnormality is noted.  Assessment & Plan by Problem: Active Problems:   Hematemesis  Mr. Mayol is a 57yo male with PMH of developmental delay, GERD, Barrett esophagus, hx of esophageal strictures, diastolic heart failure, moderate obesity (on chronic home oxygen), DM2, and HTN who presents with an episode of hematemesis. Hb 10.8 on admission. Most likely 2/2 upper GI bleed with hx of gastritis or Barrett's. GI has been consulted with plan for likely EGD tomorrow morning.  Hematemesis Hx of Barrett's, esophageal strictures, and gastritis Hb 10.8 on admission (baseline ~13-16). FOBT positive. No excessive use of NSAIDs. Most likely 2/2 upper GI bleed, possibly related to gastritis or Barrett's. Less likely Mallory-Weiss  although he does have a history of nausea/emesis recently. Follows with [facility]. Started on IV protonix drip in ED. - Admit to telemetry - GI consulted; appreciate their assistance - CBC, CMP in AM - EGD in AM - Continue IV pantoprazole drip - CLD, NPO at MN  AKI Nephrolithiasis Cholelithiasis Cr 1.6 on admission (baseline 1.1-1.3). No obstruction on U/S. Afebrile. Tachycardic and soft BP. - CBC, CMP in AM - Will continue to monitor  HTN Diastolic heart failure On chronic oxygen. Home medications include lasix 80mg  BID and carvedilol 3.125mg  BID. Most recent TTE in our system from 09/2015 shows LVEF 65-70%, no regional wall motion abnormalities, and no R->L shunt. CXR on admission showed cardiomegaly with pulmonary edema - Hold home lasix and carvedilol given hematemesis - Continue supplemental oxygen as needed  DM2 Home medications include glimepiride 2mg  daily and metformin 1000mg  BID. Glucose 150 on admission. - CBG monitoring - SSI-M TID WC  Diet: CLD, NPO at MN VTE PPx: SCDs Code Status: Full code Dispo: Admit patient to Inpatient with expected length of stay greater than 2 midnights.  Signed: Colbert Ewing, MD 07/24/2017, 6:21 PM  Pager: Mamie Nick [phone]

## 2017-07-25 ENCOUNTER — Other Ambulatory Visit: Payer: Self-pay

## 2017-07-25 DIAGNOSIS — R6250 Unspecified lack of expected normal physiological development in childhood: Secondary | ICD-10-CM

## 2017-07-25 DIAGNOSIS — J9611 Chronic respiratory failure with hypoxia: Secondary | ICD-10-CM

## 2017-07-25 DIAGNOSIS — R195 Other fecal abnormalities: Secondary | ICD-10-CM

## 2017-07-25 DIAGNOSIS — N3289 Other specified disorders of bladder: Secondary | ICD-10-CM

## 2017-07-25 DIAGNOSIS — N179 Acute kidney failure, unspecified: Secondary | ICD-10-CM

## 2017-07-25 DIAGNOSIS — I110 Hypertensive heart disease with heart failure: Secondary | ICD-10-CM

## 2017-07-25 DIAGNOSIS — R252 Cramp and spasm: Secondary | ICD-10-CM

## 2017-07-25 DIAGNOSIS — N200 Calculus of kidney: Secondary | ICD-10-CM

## 2017-07-25 DIAGNOSIS — E119 Type 2 diabetes mellitus without complications: Secondary | ICD-10-CM

## 2017-07-25 DIAGNOSIS — G4736 Sleep related hypoventilation in conditions classified elsewhere: Secondary | ICD-10-CM

## 2017-07-25 DIAGNOSIS — E668 Other obesity: Secondary | ICD-10-CM

## 2017-07-25 DIAGNOSIS — K8020 Calculus of gallbladder without cholecystitis without obstruction: Secondary | ICD-10-CM

## 2017-07-25 DIAGNOSIS — Z9981 Dependence on supplemental oxygen: Secondary | ICD-10-CM

## 2017-07-25 DIAGNOSIS — Z79899 Other long term (current) drug therapy: Secondary | ICD-10-CM

## 2017-07-25 DIAGNOSIS — Z8719 Personal history of other diseases of the digestive system: Secondary | ICD-10-CM

## 2017-07-25 DIAGNOSIS — Z7984 Long term (current) use of oral hypoglycemic drugs: Secondary | ICD-10-CM

## 2017-07-25 DIAGNOSIS — K920 Hematemesis: Secondary | ICD-10-CM

## 2017-07-25 DIAGNOSIS — I5030 Unspecified diastolic (congestive) heart failure: Secondary | ICD-10-CM

## 2017-07-25 LAB — CBC
HEMATOCRIT: 33.1 % — AB (ref 39.0–52.0)
Hemoglobin: 9.9 g/dL — ABNORMAL LOW (ref 13.0–17.0)
MCH: 23.5 pg — AB (ref 26.0–34.0)
MCHC: 29.9 g/dL — ABNORMAL LOW (ref 30.0–36.0)
MCV: 78.6 fL (ref 78.0–100.0)
PLATELETS: 163 10*3/uL (ref 150–400)
RBC: 4.21 MIL/uL — ABNORMAL LOW (ref 4.22–5.81)
RDW: 19.1 % — AB (ref 11.5–15.5)
WBC: 8.5 10*3/uL (ref 4.0–10.5)

## 2017-07-25 LAB — COMPREHENSIVE METABOLIC PANEL
ALT: 13 U/L — AB (ref 17–63)
AST: 19 U/L (ref 15–41)
Albumin: 2.9 g/dL — ABNORMAL LOW (ref 3.5–5.0)
Alkaline Phosphatase: 60 U/L (ref 38–126)
Anion gap: 15 (ref 5–15)
BUN: 75 mg/dL — AB (ref 6–20)
CHLORIDE: 94 mmol/L — AB (ref 101–111)
CO2: 30 mmol/L (ref 22–32)
CREATININE: 1.6 mg/dL — AB (ref 0.61–1.24)
Calcium: 8.3 mg/dL — ABNORMAL LOW (ref 8.9–10.3)
GFR, EST AFRICAN AMERICAN: 54 mL/min — AB (ref >60)
GFR, EST NON AFRICAN AMERICAN: 47 mL/min — AB (ref >60)
Glucose, Bld: 133 mg/dL — ABNORMAL HIGH (ref 65–99)
POTASSIUM: 3.9 mmol/L (ref 3.5–5.1)
Sodium: 139 mmol/L (ref 135–145)
Total Bilirubin: 0.6 mg/dL (ref 0.3–1.2)
Total Protein: 6 g/dL — ABNORMAL LOW (ref 6.5–8.1)

## 2017-07-25 LAB — HIV ANTIBODY (ROUTINE TESTING W REFLEX): HIV Screen 4th Generation wRfx: NONREACTIVE

## 2017-07-25 LAB — GLUCOSE, CAPILLARY
GLUCOSE-CAPILLARY: 125 mg/dL — AB (ref 65–99)
GLUCOSE-CAPILLARY: 128 mg/dL — AB (ref 65–99)
Glucose-Capillary: 167 mg/dL — ABNORMAL HIGH (ref 65–99)
Glucose-Capillary: 107 mg/dL — ABNORMAL HIGH (ref 65–99)

## 2017-07-25 MED ORDER — OXYBUTYNIN CHLORIDE 5 MG PO TABS
5.0000 mg | ORAL_TABLET | Freq: Two times a day (BID) | ORAL | Status: DC
  Administered 2017-07-25 – 2017-07-28 (×6): 5 mg via ORAL
  Filled 2017-07-25 (×6): qty 1

## 2017-07-25 MED ORDER — TIZANIDINE HCL 2 MG PO TABS
1.0000 mg | ORAL_TABLET | Freq: Three times a day (TID) | ORAL | Status: DC | PRN
  Filled 2017-07-25: qty 0.5

## 2017-07-25 MED ORDER — SERTRALINE HCL 100 MG PO TABS
100.0000 mg | ORAL_TABLET | Freq: Every day | ORAL | Status: DC
  Administered 2017-07-25 – 2017-07-28 (×4): 100 mg via ORAL
  Filled 2017-07-25 (×4): qty 1

## 2017-07-25 MED ORDER — ALLOPURINOL 300 MG PO TABS
300.0000 mg | ORAL_TABLET | Freq: Every day | ORAL | Status: DC
  Administered 2017-07-25 – 2017-07-27 (×3): 300 mg via ORAL
  Filled 2017-07-25 (×3): qty 1

## 2017-07-25 NOTE — Progress Notes (Signed)
Tony Jones 57 y o Male patient received from ED. Patient is alert and oriented x 2. Vital signs are stable.  Iv in place. Patient is on telemetry. Skin assessment done with another nurse. Patient and family given instruction about call bell, phone and unit routine. Bed in low position and side rail up x 2. Call bell in reach.

## 2017-07-25 NOTE — Consult Note (Signed)
[facility] Gastroenterology Consultation Note  Referring Provider: Dr. Joni Reining Primary Care Physician:  Christain Sacramento, MD Primary Gastroenterologist:  Dr. [name]  Reason for Consultation:  hematemesis  HPI: Tony Jones is a 57 y.o. male with developmental delay history of Barrett's esophagus and multiple endoscopic ablations at [facility].  Has had respiratory arrest with propofol and has required general anesthesia.  Has has some nausea and vomiting over the past couple days, and then had one cup hematemesis yesterday.  No hematemesis since then.  No abdominal pain, change in bowel habits, melena, hematochezia, weight loss.   Past Medical History:  Diagnosis Date  . Anxiety   . Barrett esophagus   . Diabetes mellitus type 2, uncontrolled ([place])   . Dyslipidemia   . GERD (gastroesophageal reflux disease)   . Hypertension   . Mental disorder     Past Surgical History:  Procedure Laterality Date  . THROAT SURGERY      Prior to Admission medications   Medication Sig Start Date End Date Taking? Authorizing Provider  acetaminophen (TYLENOL) 650 MG CR tablet Take 1,300 mg by mouth daily as needed for pain.   Yes [provider]  allopurinol (ZYLOPRIM) 300 MG tablet Take 300 mg by mouth daily with supper.  01/18/15  Yes [provider]  carvedilol (COREG) 3.125 MG tablet Take 1 tablet (3.125 mg total) by mouth 2 (two) times daily with a meal. 02/03/15  Yes Tat, David, MD  docusate sodium (COLACE) 100 MG capsule Take 100 mg by mouth at bedtime.   Yes [provider]  fesoterodine (TOVIAZ) 8 MG TB24 tablet Take 8 mg by mouth daily with supper.    Yes [provider]  furosemide (LASIX) 80 MG tablet Take 120 mg by mouth daily with breakfast.    Yes [provider]  glimepiride (AMARYL) 2 MG tablet Take 2 mg by mouth daily with breakfast.   Yes [provider]  levocetirizine (XYZAL) 5 MG tablet Take 5 mg by mouth daily with breakfast.    Yes  [provider]  meclizine (ANTIVERT) 25 MG tablet Take 25 mg by mouth 3 (three) times daily as needed for dizziness.  07/18/17  Yes [provider]  metFORMIN (GLUCOPHAGE) 1000 MG tablet Take 1,000 mg by mouth 2 (two) times daily with a meal. 07/08/17  Yes [provider]  Multiple Vitamin (MULTIVITAMIN WITH MINERALS) TABS tablet Take 1 tablet by mouth daily with breakfast.    Yes [provider]  OVER THE COUNTER MEDICATION Place 1 drop into both eyes daily as needed (dry eyes). Over the counter lubricating eye drop   Yes [provider]  oxybutynin (DITROPAN) 5 MG tablet Take 5 mg by mouth 2 (two) times daily with a meal.    Yes [provider]  OXYGEN Inhale 3 L into the lungs continuous.   Yes [provider]  pantoprazole (PROTONIX) 40 MG tablet Take 40 mg by mouth 2 (two) times daily with a meal.    Yes [provider]  sertraline (ZOLOFT) 100 MG tablet Take 100 mg by mouth daily with breakfast. 05/22/17  Yes [provider]  tiZANidine (ZANAFLEX) 2 MG tablet Take 1 mg by mouth every 6 (six) hours as needed (leg cramps).  01/16/16  Yes [provider]  metFORMIN (GLUCOPHAGE) 500 MG tablet Take 2 tablets (1,000 mg total) by mouth 2 (two) times daily with a meal. Patient not taking: Reported on 07/24/2017 09/29/13   Hongalgi,  Lenis Dickinson, MD  vitamin B-12 (CYANOCOBALAMIN) 500 MCG tablet Take 2 tablets (1,000 mcg total) by mouth daily. Patient not taking: Reported on 07/24/2017 02/03/15   Orson Eva, MD    Current Facility-Administered Medications  Medication Dose Route Frequency Provider Last Rate Last Dose  . acetaminophen (TYLENOL) tablet 650 mg  650 mg Oral Q6H PRN Alphonzo Grieve, MD   650 mg at 07/24/17 2112   Or  . acetaminophen (TYLENOL) suppository 650 mg  650 mg Rectal Q6H PRN Alphonzo Grieve, MD      . insulin aspart (novoLOG) injection 0-15 Units  0-15 Units Subcutaneous TID WC Colbert Ewing, MD   1  Units at 07/25/17 0902  . pantoprazole (PROTONIX) 80 mg in sodium chloride 0.9 % 250 mL (0.32 mg/mL) infusion  8 mg/hr Intravenous Continuous Alphonzo Grieve, MD 25 mL/hr at 07/25/17 0340 8 mg/hr at 07/25/17 0340    Allergies as of 07/24/2017 - Review Complete 07/24/2017  Allergen Reaction Noted  . Codeine Nausea And Vomiting 09/09/2015    Family History  Problem Relation Age of Onset  . Congestive Heart Failure Father   . Heart disease Father   . Heart attack Father   . Atrial fibrillation Father   . Brain cancer Father   . Melanoma Father   . Diabetes Mother   . Stroke Mother   . Hypertension Mother   . Fainting Paternal Grandfather   . Congestive Heart Failure Paternal Grandfather   . Atrial fibrillation Paternal Grandfather     Social History   Socioeconomic History  . Marital status: Single    Spouse name: Not on file  . Number of children: Not on file  . Years of education: Not on file  . Highest education level: Not on file  Occupational History  . Occupation: unemployed  Social Needs  . Financial resource strain: Not on file  . Food insecurity:    Worry: Not on file    Inability: Not on file  . Transportation needs:    Medical: Not on file    Non-medical: Not on file  Tobacco Use  . Smoking status: Never Smoker  . Smokeless tobacco: Never Used  Substance and Sexual Activity  . Alcohol use: No  . Drug use: No  . Sexual activity: Never  Lifestyle  . Physical activity:    Days per week: Not on file    Minutes per session: Not on file  . Stress: Not on file  Relationships  . Social connections:    Talks on phone: Not on file    Gets together: Not on file    Attends religious service: Not on file    Active member of club or organization: Not on file    Attends meetings of clubs or organizations: Not on file    Relationship status: Not on file  . Intimate partner violence:    Fear of current or ex partner: Not on file    Emotionally abused: Not on  file    Physically abused: Not on file    Forced sexual activity: Not on file  Other Topics Concern  . Not on file  Social History Narrative  . Not on file    Review of Systems: As per HPI, all others negative  Physical Exam: Vital signs in last 24 hours: Temp:  [97.6 F (36.4 C)-98.7 F (37.1 C)] 98.7 F (37.1 C) (03/25 0631) Pulse Rate:  [99-107] 107 (03/25 0631) Resp:  [18-34] 18 (03/25 0631) BP: (93-112)/(51-85) 108/72 (03/25  0631) SpO2:  [60 %-96 %] 92 % (03/25 7893) Last BM Date: 07/24/17 General:   Alert, somewhat anxious and frustrated ("where is Dr. Watt Climes?"), in NAD Head:  Normocephalic and atraumatic. Eyes:  Sclera clear, no icterus.   Conjunctiva pink. Ears:  Normal auditory acuity. Nose:  No deformity, discharge,  or lesions. Mouth:  No deformity or lesions.  Oropharynx pink & moist. Neck:  Supple; no masses or thyromegaly. Lungs:  Clear throughout to auscultation.   No wheezes, crackles, or rhonchi. No acute distress. Heart:  Regular rate and rhythm; no murmurs, clicks, rubs,  or gallops. Abdomen:  Soft, nontender and nondistended. No masses, hepatosplenomegaly or hernias noted. Normal bowel sounds, without guarding, and without rebound.     Msk:  Symmetrical without gross deformities. Normal posture. Pulses:  Normal pulses noted. Extremities:  Without clubbing or edema. Neurologic: Developmental delay; otherwise grossly normal neurologically. Skin:  Intact without significant lesions or rashes. Psych:  Somewhat agitated. Normal mood and affect.   Lab Results: Recent Labs    07/24/17 1540 07/25/17 0644  WBC 12.5* 8.5  HGB 10.8* 9.9*  HCT 38.1* 33.1*  PLT 204 163   BMET Recent Labs    07/24/17 1540 07/25/17 0644  NA 135 139  K 4.6 3.9  CL 90* 94*  CO2 30 30  GLUCOSE 150* 133*  BUN 43* 75*  CREATININE 1.60* 1.60*  CALCIUM 8.3* 8.3*   LFT Recent Labs    07/25/17 0644  PROT 6.0*  ALBUMIN 2.9*  AST 19  ALT 13*  ALKPHOS 60  BILITOT 0.6    PT/INR No results for input(s): LABPROT, INR in the last 72 hours.  Studies/Results: Dg Chest Port 1 View  Result Date: 07/24/2017 CLINICAL DATA:  Cyanosis.  Decreased O2 sats. EXAM: PORTABLE CHEST 1 VIEW COMPARISON:  02/02/2015 FINDINGS: Lungs are hyperexpanded. The cardio pericardial silhouette is enlarged. Vascular congestion with interstitial pulmonary edema pattern and basilar airspace opacity likely edema related. No substantial pleural effusion. The visualized bony structures of the thorax are intact. Telemetry leads overlie the chest. IMPRESSION: Cardiomegaly with pulmonary edema pattern. Electronically Signed   By: Misty Stanley M.D.   On: 07/24/2017 16:54   Ct Renal Stone Study  Result Date: 07/24/2017 CLINICAL DATA:  Lower abdominal pain, history of renal stones and gallstones EXAM: CT ABDOMEN AND PELVIS WITHOUT CONTRAST TECHNIQUE: Multidetector CT imaging of the abdomen and pelvis was performed following the standard protocol without IV contrast. COMPARISON:  06/24/2017 FINDINGS: Lower chest: Heart is enlarged in size but stable. Lung bases demonstrate some mild dependent changes similar to that seen on the prior exam. Hepatobiliary: The liver is within normal limits. Gallstones are seen without complicating factors. Pancreas: Unremarkable. No pancreatic ductal dilatation or surrounding inflammatory changes. Spleen: Normal in size without focal abnormality. Adrenals/Urinary Tract: The adrenal glands are within normal limits. The left kidney is unremarkable. The right kidney demonstrates some scarring as well as nonobstructing lower pole calculi. Small cyst is noted adjacent to the scar as well these changes are stable. The bladder is well distended. In the right UVJ, there are 2 calculi identified which are stable in appearance. Stomach/Bowel: The appendix is within normal limits. No obstructive or inflammatory changes are noted in the large and small bowel. Considerable ingested material  is noted within the stomach. Vascular/Lymphatic: Aortic atherosclerosis. No enlarged abdominal or pelvic lymph nodes. Reproductive: Prostate is unremarkable. Other: No abdominal wall hernia or abnormality. No abdominopelvic ascites. Musculoskeletal: Degenerative changes of the lumbar spine are  noted. IMPRESSION: Cholelithiasis without complicating factors. Nonobstructing right lower pole renal stone. Two right UVJ stones without significant obstructive change. The overall appearance is stable. No new focal abnormality is noted. Electronically Signed   By: Inez Catalina M.D.   On: 07/24/2017 18:25   Impression:  1.  Nausea and vomiting. 2.  Isolated hematemesis.  Likely Mallory Weiss tear; less likely reflux esophagitis. 3.  Barrett's esophagus with ablation. 4.  Interval drop in Hgb without ongoing GI blood loss.  Plan:  1.  Patient is somewhat agitated today, and would prefer Dr. Watt Climes help manage his case. 2.  I have discussed case at length with his mother.   3.  We will continue PPI, try diet and see how he does.   4.  If he has no further hematemesis, would manage medically, especially as he's had innumerable endoscopies.   If he has recurrent hematemesis with start of diet, would do endoscopy this admission; if not, would arrange outpatient evaluation with Dr. Watt Climes.   5.  Patient apparently had respiratory airway arrest during use of propofol with EGD in past, so now has to have general anesthesia for any endoscopic procedures. 6.  [facility] will follow.   LOS: 1 day   Darinda [place] M  07/25/2017, 10:05 AM  Cell [phone] If no answer or after 5 PM call [phone]

## 2017-07-25 NOTE — Discharge Summary (Signed)
Name: Tony Jones MRN: [id number] DOB: 09/21/1960 57 y.o. PCP: Christain Sacramento, MD  Date of Admission: 07/24/2017  3:33 PM Date of Discharge: 07/28/2017 Attending Physician: Lucious Groves, DO  Discharge Diagnosis: 1. Hematemesis, melena 2. Chronic respiratory failure 2/2 sleep disordered breathing 3. AKI 4. Non-obstructing nephrolithiasis  Active Problems:   Chronic respiratory failure with hypoxia, on home oxygen therapy (HCC)   Mental disorder   Chronic diastolic heart failure (HCC)   Hematemesis   Acute blood loss anemia   Discharge Medications: Allergies as of 07/28/2017      Reactions   Codeine Nausea And Vomiting      Medication List    TAKE these medications   acetaminophen 650 MG CR tablet Commonly known as:  TYLENOL Take 1,300 mg by mouth daily as needed for pain.   allopurinol 300 MG tablet Commonly known as:  ZYLOPRIM Take 300 mg by mouth daily with supper.   carvedilol 3.125 MG tablet Commonly known as:  COREG Take 1 tablet (3.125 mg total) by mouth 2 (two) times daily with a meal.   docusate sodium 100 MG capsule Commonly known as:  COLACE Take 100 mg by mouth at bedtime.   furosemide 80 MG tablet Commonly known as:  LASIX Take 120 mg by mouth daily with breakfast.   glimepiride 2 MG tablet Commonly known as:  AMARYL Take 2 mg by mouth daily with breakfast.   levocetirizine 5 MG tablet Commonly known as:  XYZAL Take 5 mg by mouth daily with breakfast.   meclizine 25 MG tablet Commonly known as:  ANTIVERT Take 25 mg by mouth 3 (three) times daily as needed for dizziness.   metFORMIN 1000 MG tablet Commonly known as:  GLUCOPHAGE Take 1,000 mg by mouth 2 (two) times daily with a meal. What changed:  Another medication with the same name was removed. Continue taking this medication, and follow the directions you see here.   multivitamin with minerals Tabs tablet Take 1 tablet by mouth daily with breakfast.   OVER THE COUNTER  MEDICATION Place 1 drop into both eyes daily as needed (dry eyes). Over the counter lubricating eye drop   oxybutynin 5 MG tablet Commonly known as:  DITROPAN Take 5 mg by mouth 2 (two) times daily with a meal.   OXYGEN Inhale 3 L into the lungs continuous.   pantoprazole 40 MG tablet Commonly known as:  PROTONIX Take 40 mg by mouth 2 (two) times daily with a meal.   sertraline 100 MG tablet Commonly known as:  ZOLOFT Take 100 mg by mouth daily with breakfast.   tiZANidine 2 MG tablet Commonly known as:  ZANAFLEX Take 1 mg by mouth every 6 (six) hours as needed (leg cramps).   TOVIAZ 8 MG Tb24 tablet Generic drug:  fesoterodine Take 8 mg by mouth daily with supper.   vitamin B-12 500 MCG tablet Commonly known as:  CYANOCOBALAMIN Take 2 tablets (1,000 mcg total) by mouth daily.       Disposition and follow-up:   Tony Jones was discharged from [facility] in Stable condition.  At the hospital follow up visit please address:  1.  Hematemesis, melena - Hb 8.0 on discharge. Patient asymptomatic. GI consulted and they did not feel need to do EGD during admission. He was scheduled for outpatient EGD at [place] office on April 2 - please ensure he had this done.  2.  Labs / imaging needed at time of follow-up:  CBC for Hb  3.  Pending labs/ test needing follow-up: None  Follow-up Appointments: Follow-up Information    Christain Sacramento, MD. Go on 08/15/2017.   Specialty:  Family Medicine Contact information: [address] [place] [address] [phone]           Hospital Course by problem list: Active Problems:   Chronic respiratory failure with hypoxia, on home oxygen therapy (HCC)   Mental disorder   Chronic diastolic heart failure (HCC)   Hematemesis   Acute blood loss anemia   1. Hematemesis, melena Hx of Barrett's, esophageal strictures, and gastritis Prior to admission, patient had 2 weeks of N/V, lower abdominal pain, and  dizziness thought to be secondary to nephrolithiasis and vertigo. He was managed as an outpatient by his PCP. He presented to the ER due to one episode of bright red hematemesis on the morning of admission. No hematochezia or melena prior to admission. Hb 10.8 on admission, from a baseline of 13-16 with positive FOBT. He was started on a PPI drip. Patient has a history of EGDs for esophageal strictures, follows with [facility]. [facility] was consulted and they recommended watchful waiting. Possible etiologies for likely upper GI bleed include Mallory-Weiss tear (given recent history of N/V), PUD, or gastritis. He did not have recurrence of hematemesis while inpatient and was tolerating PO intake, however he did have an episode of melena. Hb the following morning was 8.0. This was discussed with GI, who did not feel the need to do the EGD while inpatient. He was set up to follow up with his outpatient GI physician for EGD on April 2. Discharged on home protonix.  2. AKI, Nephrolithiasis, Cholelithiasis Cr slightly elevated on admission, above baseline. CT renal stone study showed non-obstructing nephrolithiasis and cholelithiasis. Cr improved to 1.05 (back to baseline) on discharge.  3. Chronic respiratory failure 2/2 sleep disordered breathing with likely sleep apnea and hypoventilation Patient was on 3L home oxygen prior to admission for sleep disordered breathing and shallow breathing. Most recent TTE in 2017 shows LVEF 65-70%, no regional wall motion abnormalities, and no R to L shunt. CXR on admission showed pulmonary edema. Home lasix was held 2/2 hematemesis. His respiratory status was at baseline. He was instructed to resume his home medications on discharge.  Discharge Vitals:   BP 111/75 (BP Location: Right Arm)   Pulse 95   Temp 98 F (36.7 C)   Resp 18   Ht 5\' 1"  (1.549 m)   Wt 202 lb 9.6 oz (91.9 kg)   SpO2 93%   BMI 38.28 kg/m   Pertinent Labs, Studies, and Procedures:  CBC Latest Ref  Rng & Units 07/28/2017 07/27/2017 07/27/2017  WBC 4.0 - 10.5 K/uL 6.8 - 6.3  Hemoglobin 13.0 - 17.0 g/dL 8.0(L) 8.8(L) 8.7(L)  Hematocrit 39.0 - 52.0 % 27.9(L) 30.4(L) 29.5(L)  Platelets 150 - 400 K/uL 151 - 149(L)   CMP Latest Ref Rng & Units 07/28/2017 07/26/2017 07/25/2017  Glucose 65 - 99 mg/dL 141(H) 124(H) 133(H)  BUN 6 - 20 mg/dL 14 40(H) 75(H)  Creatinine 0.61 - 1.24 mg/dL 1.05 1.21 1.60(H)  Sodium 135 - 145 mmol/L 137 139 139  Potassium 3.5 - 5.1 mmol/L 4.3 3.8 3.9  Chloride 101 - 111 mmol/L 94(L) 97(L) 94(L)  CO2 22 - 32 mmol/L 32 33(H) 30  Calcium 8.9 - 10.3 mg/dL 8.5(L) 8.4(L) 8.3(L)  Total Protein 6.5 - 8.1 g/dL - - 6.0(L)  Total Bilirubin 0.3 - 1.2 mg/dL - -  0.6  Alkaline Phos 38 - 126 U/L - - 60  AST 15 - 41 U/L - - 19  ALT 17 - 63 U/L - - 13(L)   FOBT positive UA negative for hematuria or UTI HIV nonreactive  CXR 07/24/2017  Cardiomegaly with pulmonary edema pattern.  CT renal stone 07/24/2017 - Cholelithiasis without complicating factors. - Nonobstructing right lower pole renal stone. - Two right UVJ stones without significant obstructive change. The overall appearance is stable. - No new focal abnormality is noted.  Discharge Instructions: Discharge Instructions    Call MD for:  persistant dizziness or light-headedness   Complete by:  As directed    Call MD for:  persistant nausea and vomiting   Complete by:  As directed    Diet - low sodium heart healthy   Complete by:  As directed    Discharge instructions   Complete by:  As directed    Mr. Dupin,  It was a pleasure to take care of you while you were in the hospital. You were here because you had an episode of throwing up blood. Your stomach doctors saw you while you were here and they did not think there was a need to do the endoscopy while you were in the hospital. They did set you up for an outpatient endoscopy at their office, scheduled for April 2. Please call the [facility] office if you have any  questions about this. - Please continue all of your home medicines, including protonix 40mg  twice a day. - Please AVOID aspirin and other NSAIDs, like ibuprofen, aleve, naproxen, motrin, etc.  You are also set up to see your primary care doctor on April 15. They can follow up on this hospital visit and make sure everything is still going okay.  The chest x-ray that you had here did not show any new changes. The CT study showed kidney stones and gallstones that were not causing an obstruction. Your red blood cell counts were a little bit low but not at a level at which you required a blood transfusion. Please continue following up with your primary doctor and stomach doctor.   Increase activity slowly   Complete by:  As directed      Signed: Colbert Ewing, MD 07/28/2017, 1:51 PM   Pager: Mamie Nick [phone]

## 2017-07-25 NOTE — Progress Notes (Signed)
Subjective:  [name] was seen resting in bed comfortably this morning. Mom and aunt at bedside. Patient denies feeling dizzy or lightheaded. Denies abdominal pain or recurrence of emesis. Mother notes that he had "pink" stool x1 prior to admission, but no frank blood or melena. Patient complaining of cramps in his lower extremities. Discussed GI's plan for watchful waiting for now and for possible EGD later if no recurrence of hematemesis.  Objective:  Vital signs in last 24 hours: Vitals:   07/24/17 2024 07/24/17 2027 07/25/17 0631 07/25/17 0632  BP: 107/69 111/60 108/72   Pulse: (!) 104 (!) 103 (!) 107   Resp: 20 20 18    Temp: 97.6 F (36.4 C) 97.8 F (36.6 C) 98.7 F (37.1 C)   TempSrc: Oral Oral Oral   SpO2: 91% 92% (!) 75% 92%   GEN: Obese male lying in bed. NAD. RESP: CTAB, no wheezes or rales, no increased WOB CV: NR & RR, no m/r/g. Trace LE edema ABD: Soft, obese, non-tender, non-distended. Normal bowel sounds NEURO: Mild slurred speech (mother states that is his baseline). Moving all 4 extremities. PSYCH: Calm and pleasant.   Assessment/Plan:  Active Problems:   Hematemesis  [name] is a 57yo male with PMH of developmental delay, GERD, Barrett esophagus, hx of esophageal strictures, diastolic heart failure, moderate obesity (on chronic home oxygen), DM2, and HTN who presents with an episode of bright red hematemesis. Hb 10.8 -> 9.9 this AM with positive FOBT. Most likely 2/2 upper GI bleed with hx of gastritis or Barrett's. GI has been consulted for assistance, they recommend watchful waiting for now with advancement to full liquid diet to see whether lesion will heal on its own and plan for EGD outpatient in ~2 weeks.  Hematemesis Hx of Barrett's, esophageal strictures, and gastritis Hb down to 9.9 this AM from 10.8 on admission (baseline ~13-16). FOBT positive. Most likely 2/2 upper GI bleed, possibly related to gastritis or Barrett's. Less likely Mallory-Weiss  although he does have a history of nausea/emesis recently. Currently on IV protonix drip. GI has been consulted, planning for conservative management and full liquid diet, with outpatient EGD in ~2 weeks - GI consulted; appreciate their assistance - Telemetry - Continue IV pantoprazole drip - Full liquid diet - F/u outpatient GI in ~2 weeks if recovers well here  AKI Nephrolithiasis Cholelithiasis Cr stable at 1.6 (baseline 1.1-1.3). No obstruction on CT renal stone study. Afebrile. Tachycardic and soft BP.  - Will continue to monitor - BMP in AM - Avoid nephrotoxic agents - Restart home oxybutynin 5mg  BID for bladder spasms  Leg cramps - Restart home tizanidine 1mg  q8h PRN  Chronic respiratory failure 2/2 sleep disordered breathing with sleep apnea and hypoventilation On 3L home oxygen. Per family report, patient often desats to 33s due to shallow breathing, however if coached to take deep breaths, sats do improve. Work-up has been unremarkable, with no R to L shunt on echo, negative V/Q scan, and elevated bicarb on BMP. Has been seen by Dr. Halford Chessman (Pulmonology) with plans for sleep study for possible contributing sleep apnea, however this has not been completed. - Continue supplemental oxygen as needed - Outpatient f/u  HTN Diastolic heart failure Home medications include lasix 80mg  BID and carvedilol 3.125mg  BID. CXR on admission showed cardiomegaly with pulmonary edema - Holding home lasix and carvedilol given hematemesis  DM2 Home medications include glimepiride 2mg  daily and metformin 1000mg  BID. Glucose 125-159 - CBG monitoring - SSI-M TID WC  Dispo:  Anticipated discharge in approximately 1-2 day(s).   Colbert Ewing, MD 07/25/2017, 6:38 AM Pager: Mamie Nick [phone]

## 2017-07-26 DIAGNOSIS — D62 Acute posthemorrhagic anemia: Secondary | ICD-10-CM | POA: Diagnosis present

## 2017-07-26 LAB — BASIC METABOLIC PANEL
Anion gap: 9 (ref 5–15)
BUN: 40 mg/dL — ABNORMAL HIGH (ref 6–20)
CALCIUM: 8.4 mg/dL — AB (ref 8.9–10.3)
CHLORIDE: 97 mmol/L — AB (ref 101–111)
CO2: 33 mmol/L — ABNORMAL HIGH (ref 22–32)
Creatinine, Ser: 1.21 mg/dL (ref 0.61–1.24)
GFR calc Af Amer: >60 mL/min (ref >60)
GLUCOSE: 124 mg/dL — AB (ref 65–99)
POTASSIUM: 3.8 mmol/L (ref 3.5–5.1)
SODIUM: 139 mmol/L (ref 135–145)

## 2017-07-26 LAB — GLUCOSE, CAPILLARY
GLUCOSE-CAPILLARY: 186 mg/dL — AB (ref 65–99)
Glucose-Capillary: 109 mg/dL — ABNORMAL HIGH (ref 65–99)
Glucose-Capillary: 197 mg/dL — ABNORMAL HIGH (ref 65–99)

## 2017-07-26 LAB — CBC
HCT: 29.1 % — ABNORMAL LOW (ref 39.0–52.0)
HEMOGLOBIN: 8.7 g/dL — AB (ref 13.0–17.0)
MCH: 23.8 pg — AB (ref 26.0–34.0)
MCHC: 29.9 g/dL — AB (ref 30.0–36.0)
MCV: 79.5 fL (ref 78.0–100.0)
Platelets: 158 10*3/uL (ref 150–400)
RBC: 3.66 MIL/uL — AB (ref 4.22–5.81)
RDW: 19.3 % — ABNORMAL HIGH (ref 11.5–15.5)
WBC: 8.1 10*3/uL (ref 4.0–10.5)

## 2017-07-26 MED ORDER — FESOTERODINE FUMARATE ER 8 MG PO TB24
8.0000 mg | ORAL_TABLET | Freq: Every day | ORAL | Status: DC
  Administered 2017-07-26 – 2017-07-27 (×2): 8 mg via ORAL
  Filled 2017-07-26 (×3): qty 1

## 2017-07-26 MED ORDER — PANTOPRAZOLE SODIUM 40 MG IV SOLR
40.0000 mg | Freq: Two times a day (BID) | INTRAVENOUS | Status: DC
  Filled 2017-07-26 (×2): qty 40

## 2017-07-26 NOTE — Progress Notes (Signed)
Subjective:  Tony Jones was seen resting in bed comfortably this morning. Brother at bedside. Denies feeling dizzy or lightheaded. Denies abdominal pain. Tolerating full liquid diet well without recurrence of emesis. Brother does note he has had some trouble urinating, with not being able to get much out at a time. States this is chronic. Discussed that we added his oxybutynin back yesterday. Discussed plan to continue full liquid diet for now and monitor for another day given his drop in Hb.  Objective:  Vital signs in last 24 hours: Vitals:   07/25/17 0632 07/25/17 1308 07/25/17 1900 07/25/17 2214  BP:  112/70  127/70  Pulse:  (!) 101  93  Resp:  18  16  Temp:  97.8 F (36.6 C)    TempSrc:  Oral    SpO2: 92% 90%  (!) 77%  Weight:   196 lb 13.9 oz (89.3 kg)    GEN: Obese male lying in bed. NAD. RESP: CTAB, no wheezes or rales, no increased WOB, on [place]. CV: NR & RR, no m/r/g. Trace LE edema ABD: Soft, obese, non-tender, non-distended. Normal bowel sounds NEURO: Mild slurred speech (mother states that is his baseline). Moving all 4 extremities. PSYCH: Calm and pleasant.   Assessment/Plan:  Active Problems:   Upper GI bleed  Tony Jones is a 57yo male with PMH of developmental delay, GERD, Barrett esophagus, hx of esophageal strictures, diastolic heart failure, moderate obesity (on chronic home oxygen), DM2, and HTN who presents with an episode of bright red hematemesis. Most likely 2/2 upper GI bleed with hx of gastritis or Barrett's. GI has been consulted for assistance, they recommend watchful waiting for now with advancement to full liquid diet to see whether lesion will heal on its own and plan for EGD outpatient in ~2 weeks. Tolerating full liquid diet without recurrence of emesis. Hemoglobin did drop this morning to 8.7 from 10.8 on admission. Will continue FLD and monitor another day.   Hematemesis Hx of Barrett's, esophageal strictures, and gastritis Hb down to 8.7 this AM  from 10.8 on admission (baseline ~13-16). FOBT positive. Most likely 2/2 upper GI bleed, possibly related to gastritis or Barrett's. Less likely Mallory-Weiss although he does have a history of nausea/emesis recently. GI has been consulted, planning for conservative management and full liquid diet, with outpatient EGD in ~2 weeks. With Hb drop this morning, will continue to monitor for another day prior to discharge and outpatient f/u. Patient tolerating PO intake and no pain or lightheadedness. Continue PPI. - GI consulted; appreciate their assistance - Telemetry - Change to IV protonix 40mg  BID - Full liquid diet - CBC in AM - F/u outpatient GI in ~2 weeks if recovers well here  AKI, resolved Nephrolithiasis Cholelithiasis Cr 1.6 -> 1.21 this AM (baseline 1.1-1.3). No obstruction on CT renal stone study. Afebrile. - Will continue to monitor - BMP in AM - Avoid nephrotoxic agents - Continue home oxybutynin 5mg  BID for bladder spasms  Leg cramps - Continue home tizanidine 1mg  q8h PRN  Chronic respiratory failure 2/2 sleep disordered breathing with sleep apnea and hypoventilation On 3L home oxygen. Per family report, patient often desats to 52s due to shallow breathing, however if coached to take deep breaths, sats do improve. Work-up has been unremarkable, with no R to L shunt on echo, negative V/Q scan, and elevated bicarb on BMP. Has been seen by Dr. Halford Chessman (Pulmonology) with plans for sleep study for possible contributing sleep apnea, however this has not been completed. -  Continue supplemental oxygen as needed - Outpatient f/u  HTN Diastolic heart failure Home medications include lasix 80mg  BID and carvedilol 3.125mg  BID. CXR on admission showed cardiomegaly with pulmonary edema - Holding home lasix and carvedilol given hematemesis  DM2 Home medications include glimepiride 2mg  daily and metformin 1000mg  BID. Glucose 109-124 - CBG monitoring - SSI-M TID WC  Dispo: Anticipated  discharge in approximately 1-2 day(s).   Colbert Ewing, MD 07/26/2017, 7:20 AM Pager: Mamie Nick [phone]

## 2017-07-26 NOTE — Care Management Note (Signed)
Case Management Note  Patient Details  Name: Tony Jones MRN: [id number] Date of Birth: [birth year]  Subjective/Objective:     Admitted with hematemesis, hx  developmental delay, GERD, Barrett's esophagus, history of esophageal strictures, chronic diastolic congestive heart failure, obesity, chronic respiratory failure on home oxygen, type 2 diabetes and hypertension. Resides with mom.  Maxten Shuler (Mother)     [phone]      PCP: Kathryne Eriksson  Action/Plan: Transition to home when medically stable. CM to f/u with transitional care needs.  Expected Discharge Date:                  Expected Discharge Plan:  Home/Self Care  In-House Referral:     Discharge planning Services  CM Consult  Post Acute Care Choice:  Resumption of Svcs/PTA Provider, Durable Medical Equipment([facility] (oxygen)) Choice offered to:     DME Arranged:    DME Agency:     HH Arranged:    HH Agency:     Status of Service:     If discussed at [employer] of Stay Meetings, dates discussed:    Additional Comments:  Sharin Mons, RN 07/26/2017, 4:24 PM

## 2017-07-26 NOTE — Progress Notes (Signed)
Patient getting agitated and kicking the bed. Brother is at bedside.Brother  refuses to have VAST to attempt access on patient again. Notified nurse Toyin RN. VU. Fran Lowes, RN

## 2017-07-26 NOTE — Progress Notes (Signed)
[name] 2:03 PM  Subjective: Patient seen and examined and case discussed with his mother as well as my partner Dr. Paulita Fujita and he is currently doing well without signs of bleeding and hopefully he will go home soon and does not have any other complaints  Objective: Vital signs stable afebrile no acute distress abdomen is soft nontender hemoglobin decreased BUN decreased probably hydrational  Assessment: Multiple medical problems including hematemesis in patient with known hiatal hernia and history of Barrett's status post ablation years ago  Plan: We will plan outpatient endoscopy with anesthesia next week when convenient but will need urgent endoscopy as needed signs of bleeding sooner and please call me or 1 of my partners if any further question or problem and make sure patient remains on pump inhibitors at home and avoids aspirin and nonsteroidals  [facility] E  Pager [phone] After 5PM or if no answer call [phone]

## 2017-07-26 NOTE — Evaluation (Signed)
Physical Therapy Evaluation Patient Details Name: Tony Jones MRN: [id number] DOB: 05-Aug-1960 Today's Date: 07/26/2017   History of Present Illness  Tony Jones is a 57yo male with PMH of developmental delay, GERD, Barrett esophagus, hx of esophageal strictures, diastolic heart failure, moderate obesity (on chronic home oxygen), DM2, and HTN who presents with an episode of bright red hematemesis. Most likely 2/2 upper GI bleed with hx of gastritis or Barrett's.  Clinical Impression  Patient presents with decreased independence with mobility due to generalized weakness, decreased cardiopulmonary endurance and he will benefit from skilled PT in the acute setting to allow return home with family support.  Likely will not need follow up PT at d/c.     Follow Up Recommendations No PT follow up    Equipment Recommendations  None recommended by PT    Recommendations for Other Services       Precautions / Restrictions Precautions Precautions: Fall      Mobility  Bed Mobility               General bed mobility comments: up in chair  Transfers Overall transfer level: Needs assistance   Transfers: Sit to/from Stand Sit to Stand: Supervision         General transfer comment: for safety due to lines  Ambulation/Gait Ambulation/Gait assistance: Min guard Ambulation Distance (Feet): 150 Feet Assistive device: None Gait Pattern/deviations: Step-through pattern;Decreased stride length;Wide base of support     General Gait Details: minguard for safety, maintained on 3L O2 with ambulation, but desats to 68%, standing for about 2 minutes mod cues for breathing, back up to 90%; HR 102  Stairs            Wheelchair Mobility    Modified Rankin (Stroke Patients Only)       Balance Overall balance assessment: Needs assistance   Sitting balance-Leahy Scale: Good     Standing balance support: No upper extremity supported Standing balance-Leahy Scale: Fair                                Pertinent Vitals/Pain Pain Assessment: Faces Faces Pain Scale: Hurts little more Pain Location: R shin Pain Descriptors / Indicators: Sore;Tender Pain Intervention(s): Repositioned;Monitored during session    Home Living Family/patient expects to be discharged to:: Private residence Living Arrangements: Parent Available Help at Discharge: Family;Available 24 hours/day Type of Home: House Home Access: Level entry(sloped driveway)     Home Layout: One level Home Equipment: Cane - single point;Walker - 2 wheels;Bedside commode      Prior Function Level of Independence: Needs assistance   Gait / Transfers Assistance Needed: ambulated without device  ADL's / Homemaking Assistance Needed: per chart mother assists with bathroom and bath and LB dressing at times        Hand Dominance        Extremity/Trunk Assessment   Upper Extremity Assessment Upper Extremity Assessment: Overall WFL for tasks assessed    Lower Extremity Assessment Lower Extremity Assessment: Overall WFL for tasks assessed    Cervical / Trunk Assessment Cervical / Trunk Assessment: Kyphotic;Other exceptions Cervical / Trunk Exceptions: short arms, flexed posture instanding  Communication   Communication: HOH  Cognition Arousal/Alertness: Awake/alert Behavior During Therapy: WFL for tasks assessed/performed Overall Cognitive Status: History of cognitive impairments - at baseline  General Comments General comments (skin integrity, edema, etc.): brother in room, then mother came: report they have pulse oximeter at home and check his O2 sats; pt with difficulty coordinating breathing and maintaining deep inspirations even after trial of incentive spiromenter had to close his nose to get full inspiration from mouth    Exercises     Assessment/Plan    PT Assessment Patient needs continued PT services  PT Problem List  Decreased mobility;Decreased activity tolerance;Decreased balance;Decreased safety awareness       PT Treatment Interventions DME instruction;Therapeutic activities;Gait training;Therapeutic exercise;Patient/family education;Balance training;Functional mobility training    PT Goals (Current goals can be found in the Care Plan section)  Acute Rehab PT Goals Patient Stated Goal: to return home PT Goal Formulation: With patient/family Time For Goal Achievement: 08/09/17 Potential to Achieve Goals: Good    Frequency Min 3X/week   Barriers to discharge        Co-evaluation               AM-PAC PT "6 Clicks" Daily Activity  Outcome Measure Difficulty turning over in bed (including adjusting bedclothes, sheets and blankets)?: A Little Difficulty moving from lying on back to sitting on the side of the bed? : A Lot Difficulty sitting down on and standing up from a chair with arms (e.g., wheelchair, bedside commode, etc,.)?: A Little Help needed moving to and from a bed to chair (including a wheelchair)?: A Little Help needed walking in hospital room?: A Little Help needed climbing 3-5 steps with a railing? : A Little 6 Click Score: 17    End of Session Equipment Utilized During Treatment: Oxygen Activity Tolerance: Treatment limited secondary to medical complications (Comment)(hypoxic ambulating on 3L O2) Patient left: with call bell/phone within reach;in chair;with family/visitor present   PT Visit Diagnosis: Unsteadiness on feet (R26.81);Other abnormalities of gait and mobility (R26.89)    Time: 3716-9678 PT Time Calculation (min) (ACUTE ONLY): 26 min   Charges:   PT Evaluation $PT Eval Moderate Complexity: 1 Mod PT Treatments $Gait Training: 8-22 mins   PT G CodesMagda Kiel, [place] [phone] 07/26/2017   Reginia Naas 07/26/2017, 2:00 PM

## 2017-07-27 ENCOUNTER — Other Ambulatory Visit: Payer: Self-pay | Admitting: Gastroenterology

## 2017-07-27 LAB — CBC
HEMATOCRIT: 29.5 % — AB (ref 39.0–52.0)
HEMOGLOBIN: 8.7 g/dL — AB (ref 13.0–17.0)
MCH: 23.6 pg — AB (ref 26.0–34.0)
MCHC: 29.5 g/dL — AB (ref 30.0–36.0)
MCV: 79.9 fL (ref 78.0–100.0)
Platelets: 149 10*3/uL — ABNORMAL LOW (ref 150–400)
RBC: 3.69 MIL/uL — AB (ref 4.22–5.81)
RDW: 19 % — ABNORMAL HIGH (ref 11.5–15.5)
WBC: 6.3 10*3/uL (ref 4.0–10.5)

## 2017-07-27 LAB — GLUCOSE, CAPILLARY
GLUCOSE-CAPILLARY: 118 mg/dL — AB (ref 65–99)
GLUCOSE-CAPILLARY: 169 mg/dL — AB (ref 65–99)
GLUCOSE-CAPILLARY: 199 mg/dL — AB (ref 65–99)
Glucose-Capillary: 182 mg/dL — ABNORMAL HIGH (ref 65–99)

## 2017-07-27 LAB — HEMOGLOBIN AND HEMATOCRIT, BLOOD
HCT: 30.4 % — ABNORMAL LOW (ref 39.0–52.0)
Hemoglobin: 8.8 g/dL — ABNORMAL LOW (ref 13.0–17.0)

## 2017-07-27 MED ORDER — SALINE SPRAY 0.65 % NA SOLN
1.0000 | NASAL | Status: DC | PRN
  Administered 2017-07-27: 1 via NASAL
  Filled 2017-07-27: qty 44

## 2017-07-27 MED ORDER — SODIUM CHLORIDE 0.9% FLUSH: 10.0000 mL | INTRAVENOUS | Status: DC | PRN

## 2017-07-27 MED ORDER — PANTOPRAZOLE SODIUM 40 MG PO TBEC
40.0000 mg | DELAYED_RELEASE_TABLET | Freq: Two times a day (BID) | ORAL | Status: DC
  Administered 2017-07-27 – 2017-07-28 (×2): 40 mg via ORAL
  Filled 2017-07-27 (×2): qty 1

## 2017-07-27 NOTE — Progress Notes (Addendum)
IMTS INTERVAL PROGRESS NOTE  Notified by RN that patient had a large BM that appeared dark black in color - concern for melena. Vitals stable, patient asymptomatic. Checked stat H&H, which was 8.8. Re-consulted GI to ask whether patient will need to remain inpatient for further monitoring/inpatient EGD vs discharge with outpatient EGD in 1 week. Patient will stay tonight - repeat labs ordered for tomorrow AM and will have IV team obtain midline IV access in case transfusions are needed. Will discuss with GI tomorrow AM.  Colbert Ewing, MD Internal Medicine, PGY-1

## 2017-07-27 NOTE — Progress Notes (Addendum)
Subjective:  Tony Jones was seen resting in bed comfortably this morning eating breakfast. Mother at bedside. Denies dizziness or lightheadedness. No abdominal pain or recurrence of emesis. Tolerating full liquid diet. Patient did lose IV access yesterday and had some issues trying to re-obtain access this morning. No need for IV access as patient likely going home today.  Objective:  Vital signs in last 24 hours: Vitals:   07/26/17 1412 07/26/17 2134 07/27/17 0553 07/27/17 0723  BP: (!) 96/58 117/61 100/63   Pulse: 83 86 94 95  Resp: 18 20 (!) 22   Temp: 98.3 F (36.8 C) (!) 97.5 F (36.4 C) 97.6 F (36.4 C)   TempSrc: Oral Oral Oral   SpO2: 96% 98% 90% 96%  Weight:       GEN: Obese male lying in bed. NAD. RESP: CTAB, no wheezes or rales, no increased WOB, on [place]. CV: NR & RR, no m/r/g. Trace LE edema ABD: Soft, obese, non-tender, non-distended. Normal bowel sounds NEURO: Mild slurred speech (mother states that is his baseline). Moving all 4 extremities. PSYCH: Calm and pleasant.   Assessment/Plan:  Active Problems:   Chronic respiratory failure with hypoxia, on home oxygen therapy (HCC)   Mental disorder   Chronic diastolic heart failure (HCC)   Hematemesis   Acute blood loss anemia  Tony Jones is a 57yo male with PMH of developmental delay, GERD, Barrett esophagus, hx of esophageal strictures, diastolic heart failure, moderate obesity (on chronic home oxygen), DM2, and HTN who presents with an episode of bright red hematemesis. Most likely 2/2 upper GI bleed with hx of gastritis or Barrett's. GI has been consulted for assistance, they recommend watchful waiting for now with advancement to full liquid diet to see whether lesion will heal on its own and plan for EGD outpatient in ~2 weeks. Tolerating full liquid diet without recurrence of emesis. Hemoglobin stable. Will touch base with GI to ensure that they are okay with d/c today.  Hematemesis Hx of Barrett's, esophageal  strictures, and gastritis Hb stable at 8.7 this AM from 10.8 on admission (baseline ~13-16). FOBT positive. Most likely 2/2 upper GI bleed, possibly related to gastritis or Barrett's. Less likely Mallory-Weiss although he does have a history of nausea/emesis recently. GI has been consulted, planning for conservative management and full liquid diet, with outpatient EGD in ~2 weeks. Patient tolerating PO intake without abdominal pain, lightheadedness, dizziness, or recurrence of emesis. - GI consulted; appreciate their assistance - Will touch base with GI today to ensure they are OK with discharge today - Telemetry - D/c on home protonix - Full liquid diet - F/u outpatient GI in ~2 weeks if recovers well here - Discharge today, pending GI recs  AKI, resolved Nephrolithiasis Cholelithiasis AKI resolved, Cr back to baseline at 1.21. No obstruction on CT renal stone study. Afebrile. - Avoid nephrotoxic agents - Continue home oxybutynin 5mg  BID for bladder spasms  Leg cramps - Continue home tizanidine 1mg  q8h PRN  Chronic respiratory failure 2/2 sleep disordered breathing with sleep apnea and hypoventilation On 3L home oxygen. Per family report, patient often desats to 32s due to shallow breathing, however if coached to take deep breaths, sats do improve. Work-up has been unremarkable, with no R to L shunt on echo, negative V/Q scan, and elevated bicarb on BMP. Has been seen by Dr. Halford Chessman (Pulmonology) with plans for sleep study for possible contributing sleep apnea, however this has not been completed. - Continue supplemental oxygen as needed - Outpatient  f/u  HTN Diastolic heart failure Home medications include lasix 80mg  BID and carvedilol 3.125mg  BID. CXR on admission showed cardiomegaly with pulmonary edema - Holding home lasix and carvedilol given hematemesis  DM2 Home medications include glimepiride 2mg  daily and metformin 1000mg  BID. Glucose 118 most recently. - CBG monitoring -  SSI-M TID WC  Dispo: Anticipated discharge today.   Colbert Ewing, MD 07/27/2017, 9:47 AM Pager: Mamie Nick [phone]

## 2017-07-28 DIAGNOSIS — Z6838 Body mass index (BMI) 38.0-38.9, adult: Secondary | ICD-10-CM

## 2017-07-28 DIAGNOSIS — Z885 Allergy status to narcotic agent status: Secondary | ICD-10-CM

## 2017-07-28 DIAGNOSIS — K921 Melena: Secondary | ICD-10-CM

## 2017-07-28 LAB — CBC
HCT: 27.9 % — ABNORMAL LOW (ref 39.0–52.0)
HEMOGLOBIN: 8 g/dL — AB (ref 13.0–17.0)
MCH: 23.1 pg — AB (ref 26.0–34.0)
MCHC: 28.7 g/dL — ABNORMAL LOW (ref 30.0–36.0)
MCV: 80.4 fL (ref 78.0–100.0)
PLATELETS: 151 10*3/uL (ref 150–400)
RBC: 3.47 MIL/uL — AB (ref 4.22–5.81)
RDW: 19.3 % — ABNORMAL HIGH (ref 11.5–15.5)
WBC: 6.8 10*3/uL (ref 4.0–10.5)

## 2017-07-28 LAB — BASIC METABOLIC PANEL
Anion gap: 11 (ref 5–15)
BUN: 14 mg/dL (ref 6–20)
CHLORIDE: 94 mmol/L — AB (ref 101–111)
CO2: 32 mmol/L (ref 22–32)
CREATININE: 1.05 mg/dL (ref 0.61–1.24)
Calcium: 8.5 mg/dL — ABNORMAL LOW (ref 8.9–10.3)
GFR calc Af Amer: >60 mL/min (ref >60)
Glucose, Bld: 141 mg/dL — ABNORMAL HIGH (ref 65–99)
POTASSIUM: 4.3 mmol/L (ref 3.5–5.1)
SODIUM: 137 mmol/L (ref 135–145)

## 2017-07-28 LAB — GLUCOSE, CAPILLARY
Glucose-Capillary: 140 mg/dL — ABNORMAL HIGH (ref 65–99)
Glucose-Capillary: 171 mg/dL — ABNORMAL HIGH (ref 65–99)

## 2017-07-28 NOTE — Care Management Note (Signed)
Case Management Note  Patient Details  Name: Tony Jones MRN: [id number] Date of Birth: [birth year]  Subjective/Objective:                  Admitted with hematemesis, hx  developmental delay, GERD, Barrett's esophagus, history of esophageal strictures, chronic diastolic congestive heart failure, obesity, chronic respiratory failure on home oxygen, type 2 diabetes and hypertension. Resides with mom.  Dixie Jafri (Mother)     [phone]         Action/Plan: Transition to home today with mom.   Expected Discharge Date:  07/26/17               Expected Discharge Plan:  Home/Self Care  In-House Referral:     Discharge planning Services  CM Consult  Post Acute Care Choice:  Resumption of Svcs/PTA Provider, Durable Medical Equipment([facility] (oxygen)) Choice offered to:   n/a  DME Arranged:   n/a DME Agency:    n/a HH Arranged:   n/a HH Agency:   n/a  Status of Service:  Completed, signed off  If discussed at [place] of Stay Meetings, dates discussed:    Additional Comments:  Sharin Mons, RN 07/28/2017, 3:17 PM

## 2017-07-28 NOTE — Plan of Care (Signed)
Progressing

## 2017-07-28 NOTE — Progress Notes (Signed)
Subjective:  Mr. Mcgrail was seen sitting up in the chair comfortably this morning. Mother at bedside. He had an episode of melena yesterday. Currently denies dizziness or lightheadedness, as well as abdominal pain, N/V, or CP.  Objective:  Vital signs in last 24 hours: Vitals:   07/27/17 1314 07/27/17 2101 07/28/17 0451 07/28/17 0700  BP: (!) 97/58 106/65 111/75   Pulse: 90 97 95   Resp: 20 19 18    Temp: 97.9 F (36.6 C) (!) 97.4 F (36.3 C) 98 F (36.7 C)   TempSrc: Oral Oral    SpO2: 100% 96% 93%   Weight:    202 lb 9.6 oz (91.9 kg)  Height:       GEN: Obese male lying in bed. NAD. RESP: CTAB, no wheezes or rales, no increased WOB, on [place]. CV: NR & RR, no m/r/g. Trace LE edema ABD: Soft, obese, non-tender, non-distended. Normal bowel sounds NEURO: Mild slurred speech (mother states that is his baseline). Moving all 4 extremities. PSYCH: Calm and pleasant.   Assessment/Plan:  Active Problems:   Chronic respiratory failure with hypoxia, on home oxygen therapy (HCC)   Mental disorder   Chronic diastolic heart failure (HCC)   Hematemesis   Acute blood loss anemia  Mr. More is a 57yo male with PMH of developmental delay, GERD, Barrett esophagus, hx of esophageal strictures, diastolic heart failure, moderate obesity (on chronic home oxygen), DM2, and HTN who presents with an episode of bright red hematemesis. Most likely 2/2 upper GI bleed with hx of gastritis or Barrett's. GI has been consulted for assistance, they recommend watchful waiting for now with advancement to full liquid diet to see whether lesion will heal on its own and plan for EGD outpatient in ~2 weeks. Had an episode of melena yesterday. HDS but slight drop in Hb to 8.0 this morning. Will discuss with GI on decision for inpatient vs outpatient EGD (already scheduled for 4/2).  Hematemesis, melena Hx of Barrett's, esophageal strictures, and gastritis Episode of melena last night. Slight drop in Hb this morning  to 8.0 from 10.8 on admission (baseline ~13-16). FOBT positive. Most likely 2/2 upper GI bleed, possibly related to gastritis or Barrett's vs Mallory-Weiss given hx of recent N/V. GI has been consulted and initially planning for conservative management and outpatient EGD on April 2 (already scheduled). However, with new drop in Hb and episode of melena, will discuss with them again today to determine inpatient vs outpatient EGD. Patient currently asymptomatic and HDS. - GI consulted; appreciate their assistance - Will discuss with GI on inpatient vs outpatient EGD - Telemetry - Full liquid diet - PO protonix 40mg  BID  AKI, resolved Nephrolithiasis Cholelithiasis AKI resolved, Cr normal at 1.05. No obstruction on CT renal stone study. Afebrile. - Continue home oxybutynin 5mg  BID for bladder spasms  Leg cramps - Continue home tizanidine 1mg  q8h PRN  Chronic respiratory failure 2/2 sleep disordered breathing with sleep apnea and hypoventilation On 3L home oxygen. Per family report, patient often desats to 42s due to shallow breathing, however if coached to take deep breaths, sats do improve. Work-up has been unremarkable, with no R to L shunt on echo, negative V/Q scan, and elevated bicarb on BMP. Has been seen by Dr. Halford Chessman (Pulmonology) with plans for sleep study for possible contributing sleep apnea, however this has not been completed. - Continue supplemental oxygen as needed - Outpatient f/u  HTN Diastolic heart failure Home medications include lasix 80mg  BID and carvedilol 3.125mg  BID.  CXR on admission showed cardiomegaly with pulmonary edema. Does not appear volume overloaded on exam. - Holding home lasix and carvedilol given concern for upper GI bleed  DM2 Home medications include glimepiride 2mg  daily and metformin 1000mg  BID. Glucose 140-182. - CBG monitoring - SSI-M TID WC  Dispo: Anticipated discharge in approximately 0-2 day(s).  Colbert Ewing, MD 07/28/2017, 7:12  AM Pager: Mamie Nick [phone]

## 2017-07-28 NOTE — Care Management Important Message (Signed)
Important Message  Patient Details  Name: Tony Jones MRN: [id number] Date of Birth: 02-09-1961   Medicare Important Message Given:  Yes    Roann Merk 07/28/2017, 1:36 PM

## 2017-07-28 NOTE — Progress Notes (Addendum)
Patient was discharged home by MD order; discharged instructions  review and give to patient's mother with care notes; Midline DIC;  patient will be escorted to the car by nurse tech via wheelchair.

## 2017-07-28 NOTE — Progress Notes (Signed)
Subjective: One episode of melena yesterday. No abdominal pain. No further bleeding or hematemesis.  Objective: Vital signs in last 24 hours: Temp:  [97.4 F (36.3 C)-98 F (36.7 C)] 98 F (36.7 C) (03/28 0451) Pulse Rate:  [90-97] 95 (03/28 0451) Resp:  [18-20] 18 (03/28 0451) BP: (97-111)/(58-75) 111/75 (03/28 0451) SpO2:  [93 %-100 %] 93 % (03/28 0451) Weight:  [202 lb 9.6 oz (91.9 kg)] 202 lb 9.6 oz (91.9 kg) (03/28 0700) Weight change:  Last BM Date: 07/27/17  PE: GEN:  NAD  Lab Results: CBC    Component Value Date/Time   WBC 6.8 07/28/2017 0431   RBC 3.47 (L) 07/28/2017 0431   HGB 8.0 (L) 07/28/2017 0431   HCT 27.9 (L) 07/28/2017 0431   PLT 151 07/28/2017 0431   MCV 80.4 07/28/2017 0431   MCH 23.1 (L) 07/28/2017 0431   MCHC 28.7 (L) 07/28/2017 0431   RDW 19.3 (H) 07/28/2017 0431   LYMPHSABS 0.9 01/30/2015 0519   MONOABS 0.5 01/30/2015 0519   EOSABS 0.2 01/30/2015 0519   BASOSABS 0.0 01/30/2015 0519   Assessment:  1.  Nausea and vomiting, resolved. 2.  Isolated hematemesis.  Likely Mallory Weiss tear; less likely reflux esophagitis. 3.  Barrett's esophagus with ablation. 4.  Interval drop in Hgb.  Melena yesterday x 1.  Likely equilibrative changes re: anemia, and likely old blood re: melena yesterday.  Plan:  1.  PPI. 2.  Advance diet to soft diet. 3.  EGD with possible further Barrett's ablation planned for next Tuesday with Dr. Watt Climes. 4.  I don't see compelling need to do endoscopy beforehand; if patient tolerates diet without difficulty, patient could go home from GI perspective, with outpatient EGD with Dr. Watt Climes at [facility] next Tuesday, as has already been planned. 5.  [facility] signing off.   Landry Dyke 07/28/2017, 12:11 PM   Cell [phone] If no answer or after 5 PM call [phone]

## 2017-07-29 ENCOUNTER — Other Ambulatory Visit: Payer: Self-pay

## 2017-07-29 ENCOUNTER — Encounter (HOSPITAL_COMMUNITY): Payer: Self-pay | Admitting: *Deleted

## 2017-07-29 NOTE — Consult Note (Signed)
            [facility] CM Primary Care Navigator  07/29/2017  Tony Jones April 06, 1961 [id number]   Went to seepatient at the bedsideto identify possible discharge needs but he was alreadydischargedper staff report.  Patient was discharged homeyesterday.  PerMD note,patient was admitted for upper GI bleeding with anemia.  Primary care provider's office called(Erica)to notify of patient's discharge andneed for post hospital follow-up and transition of care (TOC). Notified of patient's health issues needing follow-up.  Made aware to refer patient to [facility] CM if deemed necessary and appropriate for services.   For additional questions please contact:  Edwena Felty A. Bacilio Abascal, BSN, RN-BC [facility] PRIMARY CARE Navigator Cell: [phone]

## 2017-07-30 ENCOUNTER — Other Ambulatory Visit: Payer: Self-pay

## 2017-07-30 ENCOUNTER — Emergency Department (HOSPITAL_COMMUNITY)
Admission: EM | Admit: 2017-07-30 | Discharge: 2017-07-30 | Disposition: A | Discharge status: Home / Self Care | Payer: PPO | Attending: Emergency Medicine | Admitting: Emergency Medicine

## 2017-07-30 ENCOUNTER — Encounter (HOSPITAL_COMMUNITY): Payer: Self-pay | Admitting: Emergency Medicine

## 2017-07-30 ENCOUNTER — Emergency Department (HOSPITAL_COMMUNITY): Payer: PPO

## 2017-07-30 DIAGNOSIS — R0902 Hypoxemia: Secondary | ICD-10-CM | POA: Diagnosis not present

## 2017-07-30 DIAGNOSIS — K922 Gastrointestinal hemorrhage, unspecified: Secondary | ICD-10-CM | POA: Diagnosis not present

## 2017-07-30 DIAGNOSIS — R0600 Dyspnea, unspecified: Secondary | ICD-10-CM | POA: Diagnosis not present

## 2017-07-30 DIAGNOSIS — R5383 Other fatigue: Secondary | ICD-10-CM | POA: Diagnosis not present

## 2017-07-30 DIAGNOSIS — E119 Type 2 diabetes mellitus without complications: Secondary | ICD-10-CM | POA: Insufficient documentation

## 2017-07-30 DIAGNOSIS — K921 Melena: Secondary | ICD-10-CM

## 2017-07-30 DIAGNOSIS — I110 Hypertensive heart disease with heart failure: Secondary | ICD-10-CM | POA: Diagnosis not present

## 2017-07-30 DIAGNOSIS — R42 Dizziness and giddiness: Secondary | ICD-10-CM | POA: Diagnosis not present

## 2017-07-30 DIAGNOSIS — I5032 Chronic diastolic (congestive) heart failure: Secondary | ICD-10-CM | POA: Diagnosis not present

## 2017-07-30 DIAGNOSIS — R0602 Shortness of breath: Secondary | ICD-10-CM | POA: Diagnosis not present

## 2017-07-30 DIAGNOSIS — Z79899 Other long term (current) drug therapy: Secondary | ICD-10-CM | POA: Insufficient documentation

## 2017-07-30 LAB — I-STAT TROPONIN, ED: TROPONIN I, POC: 0 ng/mL (ref 0.00–0.08)

## 2017-07-30 LAB — CBC
HEMATOCRIT: 31.3 % — AB (ref 39.0–52.0)
HEMOGLOBIN: 9 g/dL — AB (ref 13.0–17.0)
MCH: 23.3 pg — ABNORMAL LOW (ref 26.0–34.0)
MCHC: 28.8 g/dL — ABNORMAL LOW (ref 30.0–36.0)
MCV: 80.9 fL (ref 78.0–100.0)
Platelets: 202 10*3/uL (ref 150–400)
RBC: 3.87 MIL/uL — ABNORMAL LOW (ref 4.22–5.81)
RDW: 19.4 % — ABNORMAL HIGH (ref 11.5–15.5)
WBC: 9.6 10*3/uL (ref 4.0–10.5)

## 2017-07-30 LAB — URINALYSIS, ROUTINE W REFLEX MICROSCOPIC
Bacteria, UA: NONE SEEN
Bilirubin Urine: NEGATIVE
GLUCOSE, UA: NEGATIVE mg/dL
HGB URINE DIPSTICK: NEGATIVE
Ketones, ur: NEGATIVE mg/dL
Leukocytes, UA: NEGATIVE
Nitrite: NEGATIVE
PH: 5 (ref 5.0–8.0)
Protein, ur: NEGATIVE mg/dL
SPECIFIC GRAVITY, URINE: 1.013 (ref 1.005–1.030)

## 2017-07-30 LAB — COMPREHENSIVE METABOLIC PANEL
ALBUMIN: 3.3 g/dL — AB (ref 3.5–5.0)
ALT: 15 U/L — ABNORMAL LOW (ref 17–63)
ANION GAP: 11 (ref 5–15)
AST: 21 U/L (ref 15–41)
Alkaline Phosphatase: 73 U/L (ref 38–126)
BUN: 22 mg/dL — AB (ref 6–20)
CHLORIDE: 93 mmol/L — AB (ref 101–111)
CO2: 32 mmol/L (ref 22–32)
Calcium: 8.4 mg/dL — ABNORMAL LOW (ref 8.9–10.3)
Creatinine, Ser: 1.37 mg/dL — ABNORMAL HIGH (ref 0.61–1.24)
GFR calc Af Amer: >60 mL/min (ref >60)
GFR, EST NON AFRICAN AMERICAN: 57 mL/min — AB (ref >60)
GLUCOSE: 121 mg/dL — AB (ref 65–99)
POTASSIUM: 4 mmol/L (ref 3.5–5.1)
Sodium: 136 mmol/L (ref 135–145)
TOTAL PROTEIN: 6.5 g/dL (ref 6.5–8.1)
Total Bilirubin: 0.7 mg/dL (ref 0.3–1.2)

## 2017-07-30 LAB — I-STAT CG4 LACTIC ACID, ED
LACTIC ACID, VENOUS: 0.59 mmol/L (ref 0.5–1.9)
Lactic Acid, Venous: 2.08 mmol/L (ref 0.5–1.9)

## 2017-07-30 LAB — ABO/RH: ABO/RH(D): O POS

## 2017-07-30 LAB — BRAIN NATRIURETIC PEPTIDE: B Natriuretic Peptide: 290.8 pg/mL — ABNORMAL HIGH (ref 0.0–100.0)

## 2017-07-30 LAB — TYPE AND SCREEN
ABO/RH(D): O POS
ANTIBODY SCREEN: NEGATIVE

## 2017-07-30 NOTE — Progress Notes (Signed)
[facility] Gastroenterology Progress Note  Tony Jones 57 y.o. 02-01-1961   Subjective: Had black stools overnight and hypotension with falling per his mother. Unable to get any history from the patient due to his mental disorder. Hgb 9 (8 on 07/28/17). Hypotensive in 70-80's reportedly at home overnight and mother reported that he fell. BP in low 100's now. Recently discharged from hospital and has been having melena as well as one episode of hematesis prior to last admission. He is set up for EGD with radiofrequency ablation of known Barrett's esophagus on 08/02/17 by Dr. Watt Climes.  Rectal temp 99.1 in ER. Brother and sister-in-law at bedside.  Objective: Vital signs: Vitals:   07/30/17 1400 07/30/17 1434  BP: 111/77   Pulse: 91   Resp: (!) 23   Temp:  99.1 F (37.3 C)  SpO2: 96%     Physical Exam: Gen: lethargic, well-nourished, no acute distress  HEENT: anicteric sclera CV: RRR Chest: CTA B Abd: soft, nontender, nondistended, +BS Ext: no edema  Lab Results: Recent Labs    07/28/17 0431 07/30/17 0950  NA 137 136  K 4.3 4.0  CL 94* 93*  CO2 32 32  GLUCOSE 141* 121*  BUN 14 22*  CREATININE 1.05 1.37*  CALCIUM 8.5* 8.4*   Recent Labs    07/30/17 0950  AST 21  ALT 15*  ALKPHOS 73  BILITOT 0.7  PROT 6.5  ALBUMIN 3.3*   Recent Labs    07/28/17 0431 07/30/17 0950  WBC 6.8 9.6  HGB 8.0* 9.0*  HCT 27.9* 31.3*  MCV 80.4 80.9  PLT 151 202      Assessment/Plan: 57 yo with history of Barrett's esophagus that has been ablated in the past who is set up for RFA of his Barrett's on EGD by Dr. Watt Climes on 08/02/17 has been having melenic stools prior to last admit this week. Hgb better than it was at discharge on 07/28/17. Hemodynamically stable and may have a viral syndrome causing his fever and hypotension but doubt he is having ongoing bleeding. Stable from a GI standpoint to go home and f/u on 08/02/17 as previously planned for his EGD with RFA but defer to ER whether to admit due  to concern for viral syndrome.   [place] C. 07/30/2017, 2:43 PM  Pager [phone]  AFTER 5 PM or on weekends please call [phone] ID: Tony Jones, male   DOB: 03-11-1961, 57 y.o.   MRN: [id number]

## 2017-07-30 NOTE — ED Triage Notes (Signed)
Pt having dark black stools this am and weakness. Is scheduled for endoscopy on Tuesday morning. Patient very weak and on 3L O2 saturation in triage 66%

## 2017-07-30 NOTE — ED Provider Notes (Signed)
[facility] DEPT Provider Note   CSN: [id number] Arrival date & time: 07/30/17  8850     History   Chief Complaint Chief Complaint  Patient presents with  . Melena    HPI ISAM UNREIN is a 57 y.o. male.  The history is provided by the patient, medical records and a parent. No language interpreter was used.  Shortness of Breath  This is a chronic problem. The average episode lasts 2 days. The problem occurs continuously.The problem has been gradually worsening. Associated symptoms include leg swelling (bilateral unchanged fromprior). Pertinent negatives include no fever, no headaches, no rhinorrhea, no neck pain, no cough, no sputum production, no wheezing, no chest pain, no syncope, no vomiting, no abdominal pain and no rash. It is unknown what precipitated the problem. He has tried nothing for the symptoms. The treatment provided no relief. Associated medical issues include chronic lung disease and heart failure.    Past Medical History:  Diagnosis Date  . Anxiety   . Barrett esophagus   . Diabetes mellitus type 2, uncontrolled ([place])   . Dyslipidemia   . GERD (gastroesophageal reflux disease)   . Hypertension   . Mental disorder     Patient Active Problem List   Diagnosis Date Noted  . Acute blood loss anemia 07/26/2017  . Hematemesis 07/24/2017  . Hypoxemia 09/17/2015  . Moderate obesity 09/17/2015  . Thrombocytopenia ([place]) 02/02/2015  . Chronic diastolic heart failure ([place]) 01/29/2015  . Diabetes mellitus type 2, controlled ([place]) 01/29/2015  . Acute diastolic CHF (congestive heart failure) ([place]) 09/26/2013  . Chronic respiratory failure with hypoxia, on home oxygen therapy ([place]) 09/26/2013  . Acute CHF ([place]) 09/26/2013  . Mental disorder   . Diabetes mellitus type 2, uncontrolled ([place])   . Hypertension   . Dyslipidemia     Past Surgical History:  Procedure Laterality Date  . THROAT SURGERY          Home Medications     Prior to Admission medications   Medication Sig Start Date End Date Taking? Authorizing Provider  acetaminophen (TYLENOL) 650 MG CR tablet Take 1,300 mg by mouth daily as needed for pain.    [provider]  allopurinol (ZYLOPRIM) 300 MG tablet Take 300 mg by mouth daily with supper.  01/18/15   [provider]  carvedilol (COREG) 3.125 MG tablet Take 1 tablet (3.125 mg total) by mouth 2 (two) times daily with a meal. 02/03/15   Tat, Shanon Brow, MD  docusate sodium (COLACE) 100 MG capsule Take 100 mg by mouth at bedtime.    [provider]  fesoterodine (TOVIAZ) 8 MG TB24 tablet Take 8 mg by mouth daily with supper.     [provider]  furosemide (LASIX) 80 MG tablet Take 120 mg by mouth daily with breakfast.     [provider]  glimepiride (AMARYL) 2 MG tablet Take 2 mg by mouth daily with breakfast.    [provider]  levocetirizine (XYZAL) 5 MG tablet Take 5 mg by mouth daily with breakfast.     [provider]  meclizine (ANTIVERT) 25 MG tablet Take 25 mg by mouth 3 (three) times daily as needed for dizziness.  07/18/17   [provider]  metFORMIN (GLUCOPHAGE) 1000 MG tablet Take 1,000 mg by mouth 2 (two) times daily with a meal. 07/08/17   [provider]  Multiple Vitamin (MULTIVITAMIN WITH MINERALS) TABS tablet Take 1 tablet by mouth daily with breakfast.  [provider]  OVER THE COUNTER MEDICATION Place 1 drop into both eyes daily as needed (dry eyes). Over the counter lubricating eye drop    [provider]  oxybutynin (DITROPAN) 5 MG tablet Take 5 mg by mouth 2 (two) times daily with a meal.     [provider]  OXYGEN Inhale 3 L into the lungs continuous.    [provider]  pantoprazole (PROTONIX) 40 MG tablet Take 40 mg by mouth 2 (two) times daily with a meal.     [provider]  sertraline (ZOLOFT) 100 MG tablet Take 100 mg by mouth daily with breakfast.  05/22/17   [provider]  tiZANidine (ZANAFLEX) 2 MG tablet Take 1 mg by mouth every 6 (six) hours as needed (leg cramps).  01/16/16   [provider]  vitamin B-12 (CYANOCOBALAMIN) 500 MCG tablet Take 2 tablets (1,000 mcg total) by mouth daily. Patient not taking: Reported on 07/24/2017 02/03/15   Orson Eva, MD    Family History Family History  Problem Relation Age of Onset  . Congestive Heart Failure Father   . Heart disease Father   . Heart attack Father   . Atrial fibrillation Father   . Brain cancer Father   . Melanoma Father   . Diabetes Mother   . Stroke Mother   . Hypertension Mother   . Fainting Paternal Grandfather   . Congestive Heart Failure Paternal Grandfather   . Atrial fibrillation Paternal Grandfather     Social History Social History   Tobacco Use  . Smoking status: Never Smoker  . Smokeless tobacco: Never Used  Substance Use Topics  . Alcohol use: No  . Drug use: No     Allergies   Codeine   Review of Systems Review of Systems  Constitutional: Positive for fatigue. Negative for chills, diaphoresis and fever.  HENT: Negative for congestion and rhinorrhea.   Eyes: Negative for visual disturbance.  Respiratory: Positive for shortness of breath. Negative for cough, sputum production, choking, chest tightness, wheezing and stridor.   Cardiovascular: Positive for leg swelling (bilateral unchanged fromprior). Negative for chest pain, palpitations and syncope.  Gastrointestinal: Positive for blood in stool. Negative for abdominal distention, abdominal pain, constipation, diarrhea, nausea and vomiting.  Genitourinary: Negative for dysuria, flank pain and frequency.  Musculoskeletal: Negative for back pain, neck pain and neck stiffness.  Skin: Negative for rash and wound.  Neurological: Negative for headaches.  Psychiatric/Behavioral: Negative for agitation and confusion.  All other systems reviewed and are negative.    Physical  Exam Updated Vital Signs BP 129/79 (BP Location: Right Arm)   Pulse 100   Resp 18   Ht 5\' 1"  (1.549 m)   Wt 91.6 kg (202 lb)   SpO2 (!) 66%   BMI 38.17 kg/m   Physical Exam  Constitutional: He appears well-developed and well-nourished. No distress.  HENT:  Head: Normocephalic.  Nose: Nose normal.  Mouth/Throat: Oropharynx is clear and moist. No oropharyngeal exudate.  Red staining on lips and tongue was reported to be cherry popsicle  Eyes: Pupils are equal, round, and reactive to light. Conjunctivae and EOM are normal.  Neck: Normal range of motion.  Cardiovascular: Normal rate and intact distal pulses.  No murmur heard. Pulmonary/Chest: He is in respiratory distress. He has rales. He exhibits no tenderness.  Abdominal: Soft. He exhibits no distension. There is no tenderness.  Musculoskeletal: He exhibits edema (mild). He exhibits no tenderness.  Neurological: He is alert. No sensory  deficit.  Skin: Capillary refill takes less than 2 seconds. He is not diaphoretic. No erythema.  Psychiatric: He has a normal mood and affect.  Nursing note and vitals reviewed.    ED Treatments / Results  Labs (all labs ordered are listed, but only abnormal results are displayed) Labs Reviewed  COMPREHENSIVE METABOLIC PANEL - Abnormal; Notable for the following components:      Result Value   Chloride 93 (*)    Glucose, Bld 121 (*)    BUN 22 (*)    Creatinine, Ser 1.37 (*)    Calcium 8.4 (*)    Albumin 3.3 (*)    ALT 15 (*)    GFR calc non Af Amer 57 (*)    All other components within normal limits  CBC - Abnormal; Notable for the following components:   RBC 3.87 (*)    Hemoglobin 9.0 (*)    HCT 31.3 (*)    MCH 23.3 (*)    MCHC 28.8 (*)    RDW 19.4 (*)    All other components within normal limits  BRAIN NATRIURETIC PEPTIDE - Abnormal; Notable for the following components:   B Natriuretic Peptide 290.8 (*)    All other components within normal limits  URINALYSIS, ROUTINE W  REFLEX MICROSCOPIC - Abnormal; Notable for the following components:   Squamous Epithelial / LPF 0-5 (*)    All other components within normal limits  I-STAT CG4 LACTIC ACID, ED - Abnormal; Notable for the following components:   Lactic Acid, Venous 2.08 (*)    All other components within normal limits  CULTURE, BLOOD (ROUTINE X 2)  CULTURE, BLOOD (ROUTINE X 2)  URINE CULTURE  I-STAT TROPONIN, ED  I-STAT CG4 LACTIC ACID, ED  POC OCCULT BLOOD, ED  TYPE AND SCREEN  ABO/RH    EKG EKG Interpretation  Date/Time:  Saturday July 30 2017 09:48:10 EDT Ventricular Rate:  94 PR Interval:    QRS Duration: 105 QT Interval:  369 QTC Calculation: 462 R Axis:   92 Text Interpretation:  Sinus rhythm Probable left atrial enlargement Borderline right axis deviation RSR' in V1 or V2, probably normal variant Repol abnrm suggests ischemia, anterior leads When compared to prior, similar t wave inversions in leads V1-V4.  No STEMI Confirmed by Antony Blackbird [phone]) on 07/30/2017 11:53:20 AM   Radiology Dg Chest Portable 1 View  Result Date: 07/30/2017 CLINICAL DATA:  57 year old with low O2 saturations. Black tarry stool this morning. EXAM: PORTABLE CHEST 1 VIEW COMPARISON:  07/04/2017 FINDINGS: Portable semi upright view of the chest was obtained. Again noted is enlargement of the cardiac silhouette. Mildly prominent interstitial lung markings are unchanged. The trachea is midline. Negative for a pneumothorax. IMPRESSION: Stable cardiomegaly with mildly prominent lung markings. Findings could represent chronic lung changes versus mild edema. Electronically Signed   By: Markus Daft M.D.   On: 07/30/2017 10:14    Procedures Procedures (including critical care time)  Medications Ordered in ED Medications - No data to display   Initial Impression / Assessment and Plan / ED Course  I have reviewed the triage vital signs and the nursing notes.  Pertinent labs & imaging results that were available  during my care of the patient were reviewed by me and considered in my medical decision making (see chart for details).     Tony Jones is a 57 y.o. male with a past medical history significant for diabetes, hypertension, congestive heart failure, prior Barrett's esophagus with upper GI bleed,  and chronic respiratory failure on home oxygen therapy 3 L at baseline with recent admission for upper GI bleed and hypoxia who presents with melena, worsening shortness of breath, fatigue, lightheadedness/near syncope, and worsening hypoxia.  Patient is accompanied by his mother who reports that he was admitted last week for upper GI bleed and anemia causing hypoxia.  He reports that he is scheduled to get an endoscopy done in several days.  They report that he began having darker stools again last night and was very lightheaded.  He had a near syncopal episode and had to sit down.  They deny syncopal episodes.  He is feeling tired and is having some continued shortness of breath.  He denies any chest pain palpitations.  He denies any fevers, chills, nausea, vomiting, hematemesis, abdominal pain, or urinary symptoms.  He has no headaches vision changes or other neurologic complaints.  In triage, patient's oxygen saturations were in the 60s and 70s on his home oxygen requirement.  Patient was placed on high flow nasal cannula with improvement of oxygen saturations.  On my exam, patient's lungs were clear and chest was nontender.  Abdomen was nontender.  Patient's lungs had mild crackles in the bases.  Patient was tachypneic on my evaluation.  Patient had red staining of his lips face and mouth however family reports that patient had a cherry icicle and has not had the bloody vomit.  Patient had some mild edema in his lower extremities which he reports is similar to prior.  Patient is perseverating on being stuck with needles for blood draws.  Based on the report of black stools and his worsening hypoxia, fatigue,  and lightheadedness I am concerned about symptomatic anemia worsening.  With his new hypoxia and worsening oxygen requirement, patient will likely require admission however will obtain work-up to look for CHF exacerbation, worsened anemia, any cardiac etiology including getting a troponin a chest x-ray, or occult infection from his recent admission causing his fatigue.  Anticipate admission after work-up is completed.      Patient's diagnostic work-up results are seen above.  Lactic acid initially elevated but improved during his ED stay.  Urinalysis did not show infection.  Metabolic panel showed slight increase in creatinine but no significant elevation in liver function.  CBC shows improved hemoglobin from prior with no leukocytosis.  BNP slightly improved from prior.  Chest x-ray shows no pneumonia with very mild edema.  EKG did not show STEMI.  Given his improving hemoglobin, suspect that the dark stool was from his recent GI bleed and not active bleeding.  GI saw the patient and did not feel he required admission or further management from a GI standpoint.  He will have his endoscopy performed in several days.  Patient had return to his home oxygen requirement and had no further episodes of hypoxia or low blood pressure.  His temperature was 99.    Conversation was held with family as to determine disposition.  Given his low blood pressures at home prior to arrival, his hypoxia on his home oxygen earlier, and the recent rectal bleeding patient was felt reasonable for a observation admission to monitor for recurrent hypoxia or low blood pressure however, patient has demonstrated remarkable stability during his stay and has had no significant abnormalities on his work-up that requires admission at this time.  With the BNP improved, suspect patient is slightly dehydrated and could likely tolerate some more oral fluids at home.  Family felt comfortable with watching his blood pressure  and his oxygen at  home with plans to follow-up with PCP and his GI team in several days.  Patient agreed with plan of care and family understood return precautions for any new or worsened symptoms.  Patient had no other questions or concerns and patient discharged in good condition.   Final Clinical Impressions(s) / ED Diagnoses   Final diagnoses:  Hypoxia  Melena    ED Discharge Orders    None      Clinical Impression: 1. Hypoxia   2. Melena     Disposition: Discharge  Condition: Good  I have discussed the results, Dx and Tx plan with the pt(& family if present). He/she/they expressed understanding and agree(s) with the plan. Discharge instructions discussed at great length. Strict return precautions discussed and pt &/or family have verbalized understanding of the instructions. No further questions at time of discharge.    New Prescriptions   No medications on file    Follow Up: Christain Sacramento, MD [address] [place] [address] [phone]     [place] DEPT [place] [id number] [place]       Shaden Lacher, Gwenyth Allegra, MD 07/30/17 [phone]

## 2017-07-30 NOTE — ED Notes (Signed)
He is sleeping comfortably and his skin is normal, warm and dry. His mother and brother are with him.

## 2017-07-30 NOTE — Discharge Instructions (Addendum)
Your work-up today did not show evidence of new infection.  We suspect you are dark stools are from your recent GI bleed.  The GI team felt you are safe for discharge home and her hemoglobin is improving.  Please stay hydrated and follow-up with your GI team in several days.  If any symptoms change or worsen, please return to the nearest emergency department.

## 2017-07-30 NOTE — ED Notes (Signed)
He continues to sleep comfortably. He arouses easily for blood draw; then goes back to sleep.

## 2017-07-31 LAB — URINE CULTURE: CULTURE: NO GROWTH

## 2017-08-01 ENCOUNTER — Other Ambulatory Visit: Payer: Self-pay | Admitting: Gastroenterology

## 2017-08-02 ENCOUNTER — Encounter (HOSPITAL_COMMUNITY)
Admission: RE | Disposition: A | Discharge status: Home / Self Care | Payer: Self-pay | Source: Ambulatory Visit | Attending: Gastroenterology

## 2017-08-02 ENCOUNTER — Encounter (HOSPITAL_COMMUNITY): Payer: Self-pay | Admitting: *Deleted

## 2017-08-02 ENCOUNTER — Ambulatory Visit (HOSPITAL_COMMUNITY): Payer: PPO | Admitting: Certified Registered Nurse Anesthetist

## 2017-08-02 ENCOUNTER — Ambulatory Visit (HOSPITAL_COMMUNITY)
Admission: RE | Admit: 2017-08-02 | Discharge: 2017-08-02 | Disposition: A | Discharge status: Home / Self Care | Payer: PPO | Source: Ambulatory Visit | Attending: Gastroenterology | Admitting: Gastroenterology

## 2017-08-02 ENCOUNTER — Other Ambulatory Visit: Payer: Self-pay

## 2017-08-02 DIAGNOSIS — Z885 Allergy status to narcotic agent status: Secondary | ICD-10-CM | POA: Diagnosis not present

## 2017-08-02 DIAGNOSIS — K449 Diaphragmatic hernia without obstruction or gangrene: Secondary | ICD-10-CM | POA: Diagnosis not present

## 2017-08-02 DIAGNOSIS — K921 Melena: Secondary | ICD-10-CM | POA: Insufficient documentation

## 2017-08-02 DIAGNOSIS — Z7984 Long term (current) use of oral hypoglycemic drugs: Secondary | ICD-10-CM | POA: Insufficient documentation

## 2017-08-02 DIAGNOSIS — I509 Heart failure, unspecified: Secondary | ICD-10-CM | POA: Diagnosis not present

## 2017-08-02 DIAGNOSIS — E119 Type 2 diabetes mellitus without complications: Secondary | ICD-10-CM | POA: Insufficient documentation

## 2017-08-02 DIAGNOSIS — K2950 Unspecified chronic gastritis without bleeding: Secondary | ICD-10-CM | POA: Diagnosis not present

## 2017-08-02 DIAGNOSIS — K219 Gastro-esophageal reflux disease without esophagitis: Secondary | ICD-10-CM | POA: Insufficient documentation

## 2017-08-02 DIAGNOSIS — Z79899 Other long term (current) drug therapy: Secondary | ICD-10-CM | POA: Diagnosis not present

## 2017-08-02 DIAGNOSIS — I110 Hypertensive heart disease with heart failure: Secondary | ICD-10-CM | POA: Insufficient documentation

## 2017-08-02 HISTORY — PX: ESOPHAGOGASTRODUODENOSCOPY: SHX5428

## 2017-08-02 LAB — GLUCOSE, CAPILLARY: GLUCOSE-CAPILLARY: 152 mg/dL — AB (ref 65–99)

## 2017-08-02 SURGERY — EGD (ESOPHAGOGASTRODUODENOSCOPY)
Anesthesia: Monitor Anesthesia Care

## 2017-08-02 MED ORDER — PROPOFOL 500 MG/50ML IV EMUL
INTRAVENOUS | Status: DC | PRN
  Administered 2017-08-02: 100 ug/kg/min via INTRAVENOUS

## 2017-08-02 MED ORDER — SODIUM CHLORIDE 0.9 % IV SOLN: INTRAVENOUS | Status: DC

## 2017-08-02 MED ORDER — LACTATED RINGERS IV SOLN
INTRAVENOUS | Status: DC
  Administered 2017-08-02: 1000 mL via INTRAVENOUS
  Administered 2017-08-02: 09:00:00 via INTRAVENOUS

## 2017-08-02 MED ORDER — LIDOCAINE 2% (20 MG/ML) 5 ML SYRINGE
INTRAMUSCULAR | Status: DC | PRN
  Administered 2017-08-02: 100 mg via INTRAVENOUS

## 2017-08-02 NOTE — Anesthesia Postprocedure Evaluation (Signed)
Anesthesia Post Note  Patient: [name]  Procedure(s) Performed: ESOPHAGOGASTRODUODENOSCOPY (EGD) (N/A )     Patient location during evaluation: Endoscopy Anesthesia Type: MAC Level of consciousness: awake Pain management: pain level controlled Vital Signs Assessment: post-procedure vital signs reviewed and stable Respiratory status: spontaneous breathing Cardiovascular status: stable Anesthetic complications: no    Last Vitals:  Vitals:   08/02/17 0950 08/02/17 1000  BP: (!) 99/53 (!) 101/56  Pulse: 85 86  Resp: 17 18  Temp:    SpO2: 96% 95%    Last Pain:  Vitals:   08/02/17 0928  TempSrc: Oral  PainSc:                  Cherity Blickenstaff

## 2017-08-02 NOTE — Anesthesia Preprocedure Evaluation (Addendum)
Anesthesia Evaluation  Patient identified by MRN, date of birth, ID band Patient awake    Reviewed: Allergy & Precautions, NPO status   Airway Mallampati: II       Dental   Pulmonary    breath sounds clear to auscultation       Cardiovascular hypertension, +CHF   Rhythm:Regular Rate:Normal     Neuro/Psych    GI/Hepatic Neg liver ROS, GERD  ,  Endo/Other  diabetes  Renal/GU negative Renal ROS     Musculoskeletal   Abdominal   Peds  Hematology  (+) anemia ,   Anesthesia Other Findings   Reproductive/Obstetrics                            Anesthesia Physical Anesthesia Plan  ASA: III  Anesthesia Plan: MAC   Post-op Pain Management:    Induction: Intravenous  PONV Risk Score and Plan: Treatment may vary due to age or medical condition  Airway Management Planned: Simple Face Mask and Nasal Cannula  Additional Equipment:   Intra-op Plan:   Post-operative Plan:   Informed Consent:   Dental advisory given  Plan Discussed with: CRNA and Anesthesiologist  Anesthesia Plan Comments:         Anesthesia Quick Evaluation

## 2017-08-02 NOTE — Discharge Instructions (Signed)
Call if question or problem otherwise can decrease pantoprazole to once a day but no aspirin or anti-inflammatory medicines and follow-up in one month   YOU HAD AN ENDOSCOPIC PROCEDURE TODAY: Refer to the procedure report and other information in the discharge instructions given to you for any specific questions about what was found during the examination. If this information does not answer your questions, please call [facility] office at [phone] to clarify.   YOU SHOULD EXPECT: Some feelings of bloating in the abdomen. Passage of more gas than usual. Walking can help get rid of the air that was put into your GI tract during the procedure and reduce the bloating. If you had a lower endoscopy (such as a colonoscopy or flexible sigmoidoscopy) you may notice spotting of blood in your stool or on the toilet paper. Some abdominal soreness may be present for a day or two, also.  DIET: Your first meal following the procedure should be a light meal and then it is ok to progress to your normal diet. A half-sandwich or bowl of soup is an example of a good first meal. Heavy or fried foods are harder to digest and may make you feel nauseous or bloated. Drink plenty of fluids but you should avoid alcoholic beverages for 24 hours. If you had a esophageal dilation, please see attached instructions for diet.   ACTIVITY: Your care partner should take you home directly after the procedure. You should plan to take it easy, moving slowly for the rest of the day. You can resume normal activity the day after the procedure however YOU SHOULD NOT DRIVE, use power tools, machinery or perform tasks that involve climbing or major physical exertion for 24 hours (because of the sedation medicines used during the test).   SYMPTOMS TO REPORT IMMEDIATELY: A gastroenterologist can be reached at any hour. Please call [phone]  for any of the following symptoms:   Following upper endoscopy (EGD, EUS, ERCP, esophageal  dilation) Vomiting of blood or coffee ground material  New, significant abdominal pain  New, significant chest pain or pain under the shoulder blades  Painful or persistently difficult swallowing  New shortness of breath  Black, tarry-looking or red, bloody stools  FOLLOW UP:  If any biopsies were taken you will be contacted by phone or by letter within the next 1-3 weeks. Call [phone]  if you have not heard about the biopsies in 3 weeks.  Please also call with any specific questions about appointments or follow up tests.

## 2017-08-02 NOTE — Transfer of Care (Signed)
Immediate Anesthesia Transfer of Care Note  Patient: Tony Jones  Procedure(s) Performed: ESOPHAGOGASTRODUODENOSCOPY (EGD) (N/A )  Patient Location: PACU and Endoscopy Unit  Anesthesia Type:MAC  Level of Consciousness: awake and alert   Airway & Oxygen Therapy: Patient Spontanous Breathing and Patient connected to nasal cannula oxygen  Post-op Assessment: Report given to RN and Post -op Vital signs reviewed and stable  Post vital signs: Reviewed and stable  Last Vitals:  Vitals Value Taken Time  BP 99/63 08/02/2017  9:30 AM  Temp    Pulse 88 08/02/2017  9:37 AM  Resp 18 08/02/2017  9:37 AM  SpO2 96 % 08/02/2017  9:37 AM  Vitals shown include unvalidated device data.  Last Pain:  Vitals:   08/02/17 0928  TempSrc: Oral  PainSc:          Complications: No apparent anesthesia complications

## 2017-08-02 NOTE — Op Note (Signed)
[facility] Patient Name: Tony Jones Procedure Date: 08/02/2017 MRN: [id number] Attending MD: [name] , MD Date of Birth: [birth year] CSN: [id number] Age: 57 Admit Type: Outpatient Procedure:                Upper GI endoscopy Indications:              Melena Providers:                [name], MD, Burtis Junes, RN, Charolette Child,                            Technician, Stephanie [place] CRNA Referring MD:              Medicines:                Propofol total dose 80 mg IV, 100 mg IV lidocaine Complications:            No immediate complications. Estimated Blood Loss:     Estimated blood loss: none. Procedure:                Pre-Anesthesia Assessment:                           - Prior to the procedure, a History and Physical                            was performed, and patient medications and                            allergies were reviewed. The patient's tolerance of                            previous anesthesia was also reviewed. The risks                            and benefits of the procedure and the sedation                            options and risks were discussed with the patient.                            All questions were answered, and informed consent                            was obtained. Prior Anticoagulants: The patient has                            taken no previous anticoagulant or antiplatelet                            agents. ASA Grade Assessment: III - A patient with                            severe systemic disease. After reviewing the risks  and benefits, the patient was deemed in                            satisfactory condition to undergo the procedure.                           After obtaining informed consent, the endoscope was                            passed under direct vision. Throughout the                            procedure, the patient's blood pressure, pulse, and                            oxygen  saturations were monitored continuously. The                            EG-[id number] ([id number]) scope was introduced through the                            mouth, and advanced to the third part of duodenum.                            The upper GI endoscopy was accomplished without                            difficulty. The patient tolerated the procedure                            well. Scope In: Scope Out: Findings:      The larynx was normal.      A tiny hiatal hernia was present.      The esophagus and gastroesophageal junction were examined with white       light from a forward view and retroflexed position. There was no visual       evidence of Barrett's esophagus except possibly 1 tiny area seen with       increased motility.      Diffuse mild inflammation characterized by congestion (edema) and       erythema was found in the entire examined stomach.      The duodenal bulb, first portion of the duodenum, second portion of the       duodenum and third portion of the duodenum were normal.      The exam was otherwise without abnormality. Impression:               - Normal larynx.                           - Tiny hiatal hernia.                           - There is no endoscopic evidence of Barrett's  esophagus except possibly 1 tiny area as above.                           - Chronic gastritis.                           - Normal duodenal bulb, first portion of the                            duodenum, second portion of the duodenum and third                            portion of the duodenum.                           - The examination was otherwise normal.                           - No specimens collected. Moderate Sedation:      N/A- Per Anesthesia Care Recommendation:           - Patient has a contact number available for                            emergencies. The signs and symptoms of potential                            delayed complications were  discussed with the                            patient. Return to normal activities tomorrow.                            Written discharge instructions were provided to the                            patient.                           - Soft diet today.                           - Continue present medications.                           - Return to GI clinic in 1 month. To recheck CBC                            guaiac and make sure no further workup is needed                           - Telephone GI clinic if symptomatic PRN. Procedure Code(s):        --- Professional ---                           [id number], Esophagogastroduodenoscopy, flexible,  transoral; diagnostic, including collection of                            specimen(s) by brushing or washing, when performed                            (separate procedure) Diagnosis Code(s):        --- Professional ---                           K44.9, Diaphragmatic hernia without obstruction or                            gangrene                           K29.50, Unspecified chronic gastritis without                            bleeding                           K92.1, Melena (includes Hematochezia) CPT copyright 2017 American Medical Association. All rights reserved. The codes documented in this report are preliminary and upon coder review may  be revised to meet current compliance requirements. [name], MD 08/02/2017 9:33:30 AM This report has been signed electronically. Number of Addenda: 0

## 2017-08-02 NOTE — Progress Notes (Signed)
Tony Jones 9:03 AM  Subjective: Patient well-known to me from years obtaining care of him and his recent hospital stay any our visit was reviewed and he was seen by me last week in the hospital and his case discussed with his mother and family and currently he has no GI complaints and his abdomen is not bothering him today  Objective: Vital signs stable afebrile exam please see preassessment evaluation labs and chest x-ray and CT is reviewed  Assessment: Recent GI blood loss in a patient with history of Barrett's ablation  Plan: Okay to proceed with endoscopy with anesthesia assistance with further workup and plans pending those findings and possible RFA if any residual Barrett's seemingly needing therapy  [facility]  Pager [phone] After 5PM or if no answer call [phone]

## 2017-08-03 ENCOUNTER — Encounter (HOSPITAL_COMMUNITY): Payer: Self-pay | Admitting: Gastroenterology

## 2017-08-04 LAB — CULTURE, BLOOD (ROUTINE X 2)
Culture: NO GROWTH
Culture: NO GROWTH
SPECIAL REQUESTS: ADEQUATE

## 2017-08-05 DIAGNOSIS — Z09 Encounter for follow-up examination after completed treatment for conditions other than malignant neoplasm: Secondary | ICD-10-CM | POA: Diagnosis not present

## 2017-08-05 DIAGNOSIS — R0902 Hypoxemia: Secondary | ICD-10-CM | POA: Diagnosis not present

## 2017-08-05 DIAGNOSIS — K922 Gastrointestinal hemorrhage, unspecified: Secondary | ICD-10-CM | POA: Diagnosis not present

## 2017-08-05 DIAGNOSIS — D649 Anemia, unspecified: Secondary | ICD-10-CM | POA: Diagnosis not present

## 2017-08-10 DIAGNOSIS — R0902 Hypoxemia: Secondary | ICD-10-CM | POA: Diagnosis not present

## 2017-08-10 DIAGNOSIS — I5032 Chronic diastolic (congestive) heart failure: Secondary | ICD-10-CM | POA: Diagnosis not present

## 2017-08-29 ENCOUNTER — Encounter: Payer: Self-pay | Admitting: Cardiology

## 2017-08-29 ENCOUNTER — Ambulatory Visit (INDEPENDENT_AMBULATORY_CARE_PROVIDER_SITE_OTHER): Payer: PPO | Admitting: Cardiology

## 2017-08-29 VITALS — BP 89/58 | HR 111 | Ht 61.0 in | Wt 205.4 lb

## 2017-08-29 DIAGNOSIS — E668 Other obesity: Secondary | ICD-10-CM

## 2017-08-29 DIAGNOSIS — I5032 Chronic diastolic (congestive) heart failure: Secondary | ICD-10-CM | POA: Diagnosis not present

## 2017-08-29 DIAGNOSIS — J9611 Chronic respiratory failure with hypoxia: Secondary | ICD-10-CM | POA: Diagnosis not present

## 2017-08-29 DIAGNOSIS — I10 Essential (primary) hypertension: Secondary | ICD-10-CM

## 2017-08-29 DIAGNOSIS — E669 Obesity, unspecified: Secondary | ICD-10-CM

## 2017-08-29 DIAGNOSIS — R42 Dizziness and giddiness: Secondary | ICD-10-CM | POA: Diagnosis not present

## 2017-08-29 DIAGNOSIS — Z9981 Dependence on supplemental oxygen: Secondary | ICD-10-CM | POA: Diagnosis not present

## 2017-08-29 MED ORDER — METOPROLOL SUCCINATE ER 25 MG PO TB24: 25.0000 mg | ORAL_TABLET | Freq: Every day | ORAL | 3 refills | Status: DC

## 2017-08-29 NOTE — Assessment & Plan Note (Signed)
Certainly not hypertensive today.  If any hypotensive. Plan: Convert from carvedilol to Toprol

## 2017-08-29 NOTE — Assessment & Plan Note (Signed)
900% sure that he truly has a diagnosis of diastolic heart failure.  No real noted diastolic dysfunction on echo.  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  He is on carvedilol which probably is more than he needs to be on along with a pretty significant dose of Lasix.  My recommendation will be to use the Lasix sort of in a 1- regimen as directed.  Convert from carvedilol 2 tablet daily  3.25 twice daily to Toprol 25 mg daily.

## 2017-08-29 NOTE — Assessment & Plan Note (Signed)
Chronic issue.  No shunting noted on bubble study. Continue supplemental oxygen. Would not be surprised if he does have elevated RV pressures/PA pressures.  He did have some RV dilation and RA dilation noted on last echo.  Unfortunately, he probably would not wear CPAP or BiPAP.  But is not clear as to exactly why he is hypoxic.  However, this clearly is the reason why he has elevated pulmonary pressures.

## 2017-08-29 NOTE — Progress Notes (Signed)
PCP: Christain Sacramento, MD  Clinic Note: Chief Complaint  Patient presents with  . office visit    complaints of bp staying low, dizziness, SOB, swelling in hands/feet    HPI: Tony Jones is a 57 y.o. male with a PMH below who presents today for delayed follow-up.  He is the son of a former patient of mine. He has long-standing congenital mental retardation & lives with his mother. He Is accompanied by his mother.. He has a relatively significant amount of anxiety and is not good with procedural based tests.  Tony Jones has chronic hypoxia for which I referred him to pulmonary medicine in 2016.  He would hours daytime oxygen 24/7.  He has had oxygen levels as low as 50 to 60% on O2 sat.  Tony Jones was last seen on Sep 09, 2015 --> noted to be hypoxic in his PCPs office.  Was reported history of CHF -however echocardiogram from September 2016 was relatively normal.  I referred him to pulmonary medicine.  Recent Hospitalizations:   08/02/2017: OP EGD -  Small hiatal hernia. Diffuse mil stomach congestion & erythema  (inflammation) --chronic gastritis  07/30/2017: ER - Melana (again noted chronic hypoxia)  07/24/2017: Obs - Hematemesis  / melena -- no EGD (plan OP EGD)  Studies Personally Reviewed - (if available, images/films reviewed: From Epic Chart or Care Everywhere)  Echo 09/25/2015: mild LVH. EF 65-70%. No RWMA.  Mildly thickened Aortic Valve.  Mild atrial dilation.  No R-L shunt  Interval History: Tony Jones presents today really more from a post hospital follow-up.  Very difficult to get a sense of history from him.  The biggest thing he points pounds out is some right upper quadrant discomfort.  The other thing they noticed that he is profoundly tired and dizzy off and on.  His mother notes that his blood pressures have been running quite low of late.  Is been no sensation of any rapid irregular heartbeats palpitations.  No syncope or near syncope.  No TIA or amaurosis fugax.  He is on a  right upper quadrant pain but no chest pain.  Despite having hypoxia on monitor, he is not noticing dyspnea unless he exerts himself.  He has been having melena, but no hematochezia.  The thought was he may have had some gallstone related nausea, but also he has difficulty coughing up phlegm, he has been somewhat congested of late and was not able to cough it up and so therefore is probably try to throw it up.  He may very well have caused some gastritis from attempted emesis.  No PND, orthopnea with mild edema.  No complaint of any chest pain or pressure with rest or exertion.  No claudication.  ROS: A comprehensive was performed. Review of Systems  Constitutional: Positive for malaise/fatigue.  HENT: Negative for nosebleeds.   Cardiovascular: Negative for leg swelling (hands & feet).  Gastrointestinal: Positive for abdominal pain (Right upper quadrant pain), heartburn and melena.  Genitourinary: Positive for flank pain (Right upper quadrant/flank).  Musculoskeletal: Negative for joint pain and myalgias.  Neurological: Positive for dizziness.  Endo/Heme/Allergies: Negative for environmental allergies.  Psychiatric/Behavioral:       No changes to baseline mental status.  All other systems reviewed and are negative.   I have reviewed and (if needed) personally updated the patient's problem list, medications, allergies, past medical and surgical history, social and family history.   Past Medical History:  Diagnosis Date  . Anxiety   .  Barrett esophagus   . Diabetes mellitus type 2, uncontrolled ([place])   . Dyslipidemia   . GERD (gastroesophageal reflux disease)   . Hypertension   . Mental disorder     Past Surgical History:  Procedure Laterality Date  . ESOPHAGOGASTRODUODENOSCOPY N/A 08/02/2017   Procedure: ESOPHAGOGASTRODUODENOSCOPY (EGD);  Surgeon: [name], MD;  Location: [facility];  Service: Endoscopy;  Laterality: N/A;  . THROAT SURGERY    . TRANSTHORACIC ECHOCARDIOGRAM   01/2015   Normal LV size.  Mild LVH.  EF 55-60%.  No RWMA.  Questionable diastolic parameters.  Mild RV and RA dilation.    Current Meds  Medication Sig  . acetaminophen (TYLENOL) 650 MG CR tablet Take 1,300 mg by mouth daily as needed for pain.  Marland Kitchen allopurinol (ZYLOPRIM) 300 MG tablet Take 300 mg by mouth daily with supper.   . docusate sodium (COLACE) 100 MG capsule Take 100 mg by mouth at bedtime.  . fesoterodine (TOVIAZ) 8 MG TB24 tablet Take 8 mg by mouth daily with supper.   . furosemide (LASIX) 80 MG tablet Take 120 mg by mouth daily with breakfast.   . glimepiride (AMARYL) 2 MG tablet Take 2 mg by mouth daily with breakfast.  . levocetirizine (XYZAL) 5 MG tablet Take 5 mg by mouth daily with breakfast.   . meclizine (ANTIVERT) 25 MG tablet Take 25 mg by mouth 3 (three) times daily as needed for dizziness.   . metFORMIN (GLUCOPHAGE) 1000 MG tablet Take 1,000 mg by mouth 2 (two) times daily with a meal.  . Multiple Vitamin (MULTIVITAMIN WITH MINERALS) TABS tablet Take 1 tablet by mouth daily with breakfast.   . OVER THE COUNTER MEDICATION Place 1 drop into both eyes daily as needed (dry eyes). Over the counter lubricating eye drop  . oxybutynin (DITROPAN) 5 MG tablet Take 5 mg by mouth 2 (two) times daily with a meal.   . OXYGEN Inhale 3 L into the lungs continuous.  . pantoprazole (PROTONIX) 40 MG tablet Take 40 mg by mouth 2 (two) times daily with a meal.   . sertraline (ZOLOFT) 100 MG tablet Take 100 mg by mouth daily with breakfast.  . tiZANidine (ZANAFLEX) 2 MG tablet Take 1 mg by mouth every 6 (six) hours as needed (leg cramps).   . vitamin B-12 (CYANOCOBALAMIN) 500 MCG tablet Take 2 tablets (1,000 mcg total) by mouth daily.  . [DISCONTINUED] carvedilol (COREG) 3.125 MG tablet Take 1 tablet (3.125 mg total) by mouth 2 (two) times daily with a meal.    Allergies  Allergen Reactions  . Codeine Nausea And Vomiting    Social History   Tobacco Use  . Smoking status: Never  Smoker  . Smokeless tobacco: Never Used  Substance Use Topics  . Alcohol use: No  . Drug use: No   Social History   Social History Narrative  . Not on file    family history includes Atrial fibrillation in his father and paternal grandfather; Brain cancer in his father; Congestive Heart Failure in his father and paternal grandfather; Diabetes in his mother; Fainting in his paternal grandfather; Heart attack in his father; Heart disease in his father; Hypertension in his mother; Melanoma in his father; Stroke in his mother.  Wt Readings from Last 3 Encounters:  08/29/17 205 lb 6.4 oz (93.2 kg)  08/02/17 200 lb (90.7 kg)  07/30/17 202 lb (91.6 kg)    PHYSICAL EXAM BP (!) 89/58 (BP Location: Left Arm)   Pulse (!) 111  Ht 5\' 1"  (1.549 m)   Wt 205 lb 6.4 oz (93.2 kg)   BMI 38.81 kg/m ; SaO2 - 50%. Physical Exam  Constitutional: He is oriented to person, place, and time. He appears well-developed and well-nourished. No distress.  Clearly has classic features of long-standing developmental delay.  HENT:  Head: Normocephalic and atraumatic.  Neck: No JVD present.  Cardiovascular: Regular rhythm, normal heart sounds, intact distal pulses and normal pulses.  No extrasystoles are present. Tachycardia present. PMI is not displaced. Exam reveals no gallop and no friction rub.  No murmur heard. Pulmonary/Chest: Effort normal and breath sounds normal. No respiratory distress. He has no wheezes. He has no rales.  Abdominal: Soft. Bowel sounds are normal. He exhibits no distension. There is tenderness (Right upper quadrant -there actually appears to be a small knot In the oblique muscles along the flank.  ). There is no rebound.  Protuberant, obese abdomen  Musculoskeletal: Normal range of motion. He exhibits edema (Trivial bilateral LE).  Neurological: He is alert and oriented to person, place, and time.  Psychiatric:  He seems to understand questions, but tends to perseverate on certain  issues.  He was very interested in his blood pressure levels.  Was also very interested in the fact he is taking drugs and is worried that this is to make him an addict.  --Besides his right lower quadrant pain symptoms, most of the history is provided by his mother.  Nursing note and vitals reviewed.   Adult ECG Report - n/a  Other studies Reviewed: Additional studies/ records that were reviewed today include:  Recent Labs:  No results found for: CHOL, HDL, LDLCALC, LDLDIRECT, TRIG, CHOLHDL   ASSESSMENT / PLAN: Problem List Items Addressed This Visit    Postural dizziness - Primary    In light of him having blood pressure of 89/58 with a heart rate of 111, I suspect that this is probably related to orthostatic hypotension and may very well be related to dehydration.  Talked about the importance of staying adequately hydrated. Plan: Convert carvedilol to Toprol which will provide more heart rate control and less blood pressure.   Hydrate I suspect tachycardia is exacerbated by anemia.       Moderate obesity (Chronic)    Talked dietary discretion. -- He is not really going to be able to exercise.      Hypertension    Certainly not hypertensive today.  If any hypotensive. Plan: Convert from carvedilol to Toprol      Relevant Medications   metoprolol succinate (TOPROL XL) 25 MG 24 hr tablet   Chronic respiratory failure with hypoxia, on home oxygen therapy (HCC)    Chronic issue.  No shunting noted on bubble study. Continue supplemental oxygen. Would not be surprised if he does have elevated RV pressures/PA pressures.  He did have some RV dilation and RA dilation noted on last echo.  Unfortunately, he probably would not wear CPAP or BiPAP.  But is not clear as to exactly why he is hypoxic.  However, this clearly is the reason why he has elevated pulmonary pressures.      Chronic diastolic heart failure (HCC) (Chronic)    900% sure that he truly has a diagnosis of diastolic heart  failure.  No real noted diastolic dysfunction on echo.  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  He is on carvedilol which probably is more than he needs to be on along with a pretty significant  dose of Lasix.  My recommendation will be to use the Lasix sort of in a 1- regimen as directed.  Convert from carvedilol 2 tablet daily  3.25 twice daily to Toprol 25 mg daily.      Relevant Medications   metoprolol succinate (TOPROL XL) 25 MG 24 hr tablet       I spent a total of 25 minutes with the patient and chart review. >  50% of the time was spent in direct patient consultation.   Current medicines are reviewed at length with the patient today.  (+/- concerns) fatigue & low BP The following changes have been made:  see below.  Patient Instructions  Medication instructions  Stop carvedilol   Start metoprolol succinate  ( TOPROL XL ) 25 MG take one tablet daily. If blood pressure is below 100 / ( top number) take 1/2 tablet that day.    Your physician recommends that you schedule a follow-up appointment in 3 montsh wti DR Andrae Claunch.    If you need a refill on your cardiac medications before your next appointment, please call your pharmacy.     Studies Ordered:   No orders of the defined types were placed in this encounter.     Glenetta Hew, M.D., M.S. Interventional Cardiologist   Pager # [phone] Phone # [phone] [address] [place], [place] [address]   Thank you for choosing [facility] at [facility]!!

## 2017-08-29 NOTE — Assessment & Plan Note (Signed)
Talked dietary discretion. -- He is not really going to be able to exercise.

## 2017-08-29 NOTE — Assessment & Plan Note (Deleted)
Chronic issue.  No shunting noted on bubble study. Continue supplemental oxygen. Would not be surprised if he does have elevated RV pressures/PA pressures.  He did have some RV dilation and RA dilation noted on last echo.  Unfortunately, he probably would not wear CPAP or BiPAP.  But is not clear as to exactly why he is hypoxic.  However, this clearly is the reason why he has elevated pulmonary pressures.

## 2017-08-29 NOTE — Patient Instructions (Signed)
Medication instructions  Stop carvedilol   Start metoprolol succinate  ( TOPROL XL ) 25 MG take one tablet daily. If blood pressure is below 100 / ( top number) take 1/2 tablet that day.    Your physician recommends that you schedule a follow-up appointment in 3 montsh wti DR HARDING.    If you need a refill on your cardiac medications before your next appointment, please call your pharmacy.

## 2017-08-29 NOTE — Assessment & Plan Note (Signed)
In light of him having blood pressure of 89/58 with a heart rate of 111, I suspect that this is probably related to orthostatic hypotension and may very well be related to dehydration.  Talked about the importance of staying adequately hydrated. Plan: Convert carvedilol to Toprol which will provide more heart rate control and less blood pressure.   Hydrate I suspect tachycardia is exacerbated by anemia.

## 2017-09-01 DIAGNOSIS — I9589 Other hypotension: Secondary | ICD-10-CM | POA: Diagnosis not present

## 2017-09-01 DIAGNOSIS — R0902 Hypoxemia: Secondary | ICD-10-CM | POA: Diagnosis not present

## 2017-09-09 DIAGNOSIS — R0902 Hypoxemia: Secondary | ICD-10-CM | POA: Diagnosis not present

## 2017-09-09 DIAGNOSIS — I5032 Chronic diastolic (congestive) heart failure: Secondary | ICD-10-CM | POA: Diagnosis not present

## 2017-09-13 ENCOUNTER — Telehealth: Payer: Self-pay | Admitting: Cardiology

## 2017-09-13 MED ORDER — MIDODRINE HCL 5 MG PO TABS: 5.0000 mg | ORAL_TABLET | Freq: Three times a day (TID) | ORAL | 5 refills | Status: DC

## 2017-09-13 NOTE — Telephone Encounter (Signed)
Returned call to patient's mother. He has recurrent low BP - consistently under 458 systolic. His HR has been as high as 123bpm.   Patient's mom has decreased Toprol to 12.5mg  daily as suggested at last visit and has tried cutting the pill out altogether d/t low BP but this is causing him to retain fluid, weight gain - so she had patient resume Toprol. He has postural dizziness - has fallen twice since his last visit d/t being weak. Mom reports he is fatigued.   Will route to MD for recommendations

## 2017-09-13 NOTE — Telephone Encounter (Signed)
New message    Patient's mother calling with BP concerns Mother's Cell [phone]   Pt c/o BP issue: STAT if pt c/o blurred vision, one-sided weakness or slurred speech  1. What are your last 5 BP readings? 83/56 last night, 77/58, 91/77, 74/56, 88/60  2. Are you having any other symptoms (ex. Dizziness, headache, blurred vision, passed out)? unknown  3. What is your BP issue? Feels BP too low

## 2017-09-13 NOTE — Telephone Encounter (Signed)
Let us start Midodrine 5 mg 3 times a day.  Liberalize salt intake & hydration.  If he will wear them - also recommend support stockings. (can try the zipper kind)  Glenetta Hew, MD

## 2017-09-13 NOTE — Telephone Encounter (Signed)
Patient's mom aware of MD recommendations. Rx(s) sent to pharmacy electronically.

## 2017-09-16 ENCOUNTER — Telehealth: Payer: Self-pay | Admitting: Cardiology

## 2017-09-16 NOTE — Telephone Encounter (Signed)
Pt c/o BP issue: STAT if pt c/o blurred vision, one-sided weakness or slurred speech  1. What are your last 5 BP readings? This morning it is 72/49 and pulse is 101,Thursday it was 87/60,92/54 and last nigh 71/50  2. Are you having any other symptoms (ex. Dizziness, headache, blurred vision, passed out)? Real nauseated and no energy  3. What is your BP issue?  Blood pressure is extremely low

## 2017-09-16 NOTE — Telephone Encounter (Signed)
Returned call to patient's mother.She stated son's B/P continues to be low.Readings listed below.B/P this morning 72/49 pulse 101.Stated pulse has been ranging 130 to 104.Stated this is the 3rd day of taking proamatine 5 mg three times a day.Stated she don't see any difference.Advised to decrease toprol 25 mg to 1/2 tablet daily.She is concerned if something is not done she is going to lose son.Advised I will send message to [place] for advice.

## 2017-09-16 NOTE — Telephone Encounter (Signed)
Spoke to patient's mother appointment scheduled with Jory Sims DNP Mon 5/20 at 11:30 am.Advised to bring a list of B/P and pulse readings.Also bring all medications to appointment.

## 2017-09-19 ENCOUNTER — Encounter: Payer: Self-pay | Admitting: Adult Health

## 2017-09-19 ENCOUNTER — Ambulatory Visit (INDEPENDENT_AMBULATORY_CARE_PROVIDER_SITE_OTHER): Payer: PPO | Admitting: Adult Health

## 2017-09-19 VITALS — BP 122/72 | HR 106 | Ht 61.0 in | Wt 207.2 lb

## 2017-09-19 DIAGNOSIS — Z79899 Other long term (current) drug therapy: Secondary | ICD-10-CM | POA: Diagnosis not present

## 2017-09-19 DIAGNOSIS — R600 Localized edema: Secondary | ICD-10-CM | POA: Diagnosis not present

## 2017-09-19 DIAGNOSIS — I5032 Chronic diastolic (congestive) heart failure: Secondary | ICD-10-CM

## 2017-09-19 DIAGNOSIS — E785 Hyperlipidemia, unspecified: Secondary | ICD-10-CM

## 2017-09-19 DIAGNOSIS — I509 Heart failure, unspecified: Secondary | ICD-10-CM

## 2017-09-19 DIAGNOSIS — H6123 Impacted cerumen, bilateral: Secondary | ICD-10-CM | POA: Diagnosis not present

## 2017-09-19 NOTE — Progress Notes (Signed)
Cardiology Office Note   Date:  09/19/2017   ID:  Tony Jones, DOB 05-25-1960, MRN [id number]  PCP:  Christain Sacramento, MD  Cardiologist:  Ellyn Hack   Chief Complaint  Patient presents with  . Follow-up    blood pressure, pt denies chest pains, SOB noted does use O2, pt does have swelling in hands/feet     History of Present Illness: Tony Jones is a 57 y.o. male who presents for ongoing assessment and management of chronic hypoxia, on oxygen followed by pulmonary.  Other history of mental retardation cared for by his mother, chronic dizziness, diagnosed as postural dizziness related to orthostatic hypotension, dehydration.  He was advised on adequate hydration.  He also has a history of tachycardia and was converted from carvedilol to metoprolol for better heart rate control unless medication induced hypertension.  He is here again today because his mother has noted that his blood pressure has been running low, 72/49 with a pulse of 101 with elevated heart rates and hypotension despite taking Primatene 3 times a day.  She was advised to decrease metoprolol to 12.5 mg daily.  She was quite anxious.  Blood pressure and oxygen saturation have been labile at home.  His mother is meticulous about keeping track of this.  She admits that he is not eating very much but she tries to keep him hydrated.  Blood pressures have been running 79/64-120/ 80.  Heart rate has been running elevated in the 80s and 90s at rest.  O2 sats is been running in the high 70s to middle 80s on oxygen.  His mother states that he sometimes forgets to breathe.  She states that he has had prior sleep study tests which have been unremarkable, and she also states that she has been seen by pulmonology in the past.  There is been no follow-up.  Past Medical History:  Diagnosis Date  . Anxiety   . Barrett esophagus   . Diabetes mellitus type 2, uncontrolled ([place])   . Dyslipidemia   . GERD (gastroesophageal reflux disease)   .  Hypertension   . Mental disorder     Past Surgical History:  Procedure Laterality Date  . ESOPHAGOGASTRODUODENOSCOPY N/A 08/02/2017   Procedure: ESOPHAGOGASTRODUODENOSCOPY (EGD);  Surgeon: [name], MD;  Location: [facility];  Service: Endoscopy;  Laterality: N/A;  . THROAT SURGERY    . TRANSTHORACIC ECHOCARDIOGRAM  01/2015   Normal LV size.  Mild LVH.  EF 55-60%.  No RWMA.  Questionable diastolic parameters.  Mild RV and RA dilation.     Current Outpatient Medications  Medication Sig Dispense Refill  . acetaminophen (TYLENOL) 650 MG CR tablet Take 1,300 mg by mouth daily as needed for pain.    Marland Kitchen allopurinol (ZYLOPRIM) 300 MG tablet Take 300 mg by mouth daily with supper.     . docusate sodium (COLACE) 100 MG capsule Take 100 mg by mouth at bedtime.    . fesoterodine (TOVIAZ) 8 MG TB24 tablet Take 8 mg by mouth daily with supper.     . furosemide (LASIX) 80 MG tablet Take 120 mg by mouth daily with breakfast.     . glimepiride (AMARYL) 2 MG tablet Take 2 mg by mouth daily with breakfast.    . levocetirizine (XYZAL) 5 MG tablet Take 5 mg by mouth daily with breakfast.     . meclizine (ANTIVERT) 25 MG tablet Take 25 mg by mouth 3 (three) times daily as needed for dizziness.   0  .  metFORMIN (GLUCOPHAGE) 1000 MG tablet Take 1,000 mg by mouth 2 (two) times daily with a meal.  1  . metoprolol succinate (TOPROL XL) 25 MG 24 hr tablet Take 1 tablet (25 mg total) by mouth daily. 90 tablet 3  . midodrine (PROAMATINE) 5 MG tablet Take 1 tablet (5 mg total) by mouth 3 (three) times daily with meals. 90 tablet 5  . Multiple Vitamin (MULTIVITAMIN WITH MINERALS) TABS tablet Take 1 tablet by mouth daily with breakfast.     . OVER THE COUNTER MEDICATION Place 1 drop into both eyes daily as needed (dry eyes). Over the counter lubricating eye drop    . oxybutynin (DITROPAN) 5 MG tablet Take 5 mg by mouth 2 (two) times daily with a meal.     . OXYGEN Inhale 3 L into the lungs continuous.    .  pantoprazole (PROTONIX) 40 MG tablet Take 40 mg by mouth 2 (two) times daily with a meal.     . sertraline (ZOLOFT) 100 MG tablet Take 100 mg by mouth daily with breakfast.  1  . tiZANidine (ZANAFLEX) 2 MG tablet Take 1 mg by mouth every 6 (six) hours as needed (leg cramps).     . vitamin B-12 (CYANOCOBALAMIN) 500 MCG tablet Take 2 tablets (1,000 mcg total) by mouth daily. 30 tablet 0   No current facility-administered medications for this visit.     Allergies:   Codeine    Social History:  The patient  reports that he has never smoked. He has never used smokeless tobacco. He reports that he does not drink alcohol or use drugs.   Family History:  The patient's family history includes Atrial fibrillation in his father and paternal grandfather; Brain cancer in his father; Congestive Heart Failure in his father and paternal grandfather; Diabetes in his mother; Fainting in his paternal grandfather; Heart attack in his father; Heart disease in his father; Hypertension in his mother; Melanoma in his father; Stroke in his mother.    ROS: All other systems are reviewed and negative. Unless otherwise mentioned in H&P    PHYSICAL EXAM: VS:  BP 122/72 (BP Location: Left Arm)   Pulse (!) 106   Ht 5\' 1"  (1.549 m)   Wt 207 lb 3.2 oz (94 kg)   BMI 39.15 kg/m  , BMI Body mass index is 39.15 kg/m. GEN: Well nourished, well developed, in no acute distress  HEENT: normal  Neck: no JVD, carotid bruits, or masses Cardiac: RRR, tachycardic; no murmurs, rubs, or gallops,2+-3+pitting  edema  Respiratory:  clear to auscultation bilaterally, normal work of breathing GI: soft, nontender, nondistended, + BS MS: no deformity or atrophy  Skin: warm and dry, no rash Neuro:  Strength and sensation are intact Psych: euthymic mood, full affect   EKG:  Tachycardic, HR 106 bpm. Inferior/lateral T-wave abnormality.  Unchanged from EKG in 07/31/2017 with the exception of HR.    Recent Labs: 07/30/2017: ALT 15; B  Natriuretic Peptide 290.8; BUN 22; Creatinine, Ser 1.37; Hemoglobin 9.0; Platelets 202; Potassium 4.0; Sodium 136    Lipid Panel No results found for: CHOL, TRIG, HDL, CHOLHDL, VLDL, LDLCALC, LDLDIRECT    Wt Readings from Last 3 Encounters:  09/19/17 207 lb 3.2 oz (94 kg)  08/29/17 205 lb 6.4 oz (93.2 kg)  08/02/17 200 lb (90.7 kg)      Other studies Reviewed: Echocardiogram 10-17-15 Left ventricle: The cavity size was normal. There was mild   concentric hypertrophy. Systolic function was vigorous. The  estimated ejection fraction was in the range of 65% to 70%. Wall   motion was normal; there were no regional wall motion   abnormalities. - Aortic valve: A bicuspid morphology cannot be excluded; mildly   thickened, mildly calcified leaflets. - Right ventricle: The cavity size was mildly dilated. - Right atrium: The atrium was mildly dilated. - Atrial septum: Echo contrast study showed no right-to-left atrial   level shunt, at baseline or with provocation.  ASSESSMENT AND PLAN:  1.  Hypotension: Multifactorial.  The patient's mother has been giving him extra doses of Lasix due to worsening lower extremity edema.  Blood pressure has been lower as a result.  The patient continues on midodrine. She states that he has been taking his medications without difficulty.  I will check a BMET to evaluate kidney function.  2.  Dyspnea: His mother states that his dyspnea has worsened and he is not breathing well on oxygen.  I may check a CBC as he has had a history in the past of a GI bleed due to esophageal varices.  He is tachycardic and mildly hypotensive I want to be sure that there is no issue of anemia precipitating this.  Check echocardiogram.  I am going to keep him on metoprolol 12.5 mg daily for now.  3.  Diabetes: Followed by PCP.  4. Chronic Lower Extremity Edema: I have asked him to keep legs elevated. He is wheelchair bound. I have suggested support hose. His mother states that  they are too difficult to apply. She has ordered zippered hose.   Current medicines are reviewed at length with the patient today.    Labs/ tests ordered today include: BMET CBC echo Phill Myron. [place] Pugh, ANP, AACC   09/19/2017 1:08 PM    [facility] [address] [place], [place] [address] Phone: [phone]; Fax: [phone]

## 2017-09-19 NOTE — Patient Instructions (Signed)
Medication Instructions:  NO CHANGES- Your physician recommends that you continue on your current medications as directed. Please refer to the Current Medication list given to you today.  If you need a refill on your cardiac medications before your next appointment, please call your pharmacy.  Labwork: CBC AND BMET HERE IN OUR OFFICE AT [facility]  Take the provided lab slips with you to the lab for your blood draw.   Testing/Procedures: REFERRAL TO [facility] > HOMECARE AND O2 MANAGEMENT  Special Instructions: REFERRAL TP [facility]>>>ASST W/HOMECARE AND O2  Follow-Up: Your physician wants you to follow-up in: AFTER TESTING WITH DR HARDING.  Thank you for choosing [facility] at [facility]!!

## 2017-09-20 LAB — BASIC METABOLIC PANEL
BUN / CREAT RATIO: 20 (ref 9–20)
BUN: 40 mg/dL — ABNORMAL HIGH (ref 6–24)
CO2: 29 mmol/L (ref 20–29)
CREATININE: 2.05 mg/dL — AB (ref 0.76–1.27)
Calcium: 7.9 mg/dL — ABNORMAL LOW (ref 8.7–10.2)
Chloride: 92 mmol/L — ABNORMAL LOW (ref 96–106)
GFR calc Af Amer: 41 mL/min/{1.73_m2} — ABNORMAL LOW (ref >59)
GFR calc non Af Amer: 35 mL/min/{1.73_m2} — ABNORMAL LOW (ref >59)
GLUCOSE: 148 mg/dL — AB (ref 65–99)
Potassium: 3.5 mmol/L (ref 3.5–5.2)
SODIUM: 141 mmol/L (ref 134–144)

## 2017-09-20 LAB — CBC
Hematocrit: 30.9 % — ABNORMAL LOW (ref 37.5–51.0)
Hemoglobin: 8.6 g/dL — ABNORMAL LOW (ref 13.0–17.7)
MCH: 20.1 pg — AB (ref 26.6–33.0)
MCHC: 27.8 g/dL — AB (ref 31.5–35.7)
MCV: 72 fL — AB (ref 79–97)
PLATELETS: 210 10*3/uL (ref 150–450)
RBC: 4.28 x10E6/uL (ref 4.14–5.80)
RDW: 19.6 % — AB (ref 12.3–15.4)
WBC: 8.7 10*3/uL (ref 3.4–10.8)

## 2017-09-21 DIAGNOSIS — N201 Calculus of ureter: Secondary | ICD-10-CM | POA: Diagnosis not present

## 2017-09-22 ENCOUNTER — Telehealth: Payer: Self-pay | Admitting: Adult Health

## 2017-09-22 NOTE — Telephone Encounter (Signed)
New message ° ° °Please call with lab results °

## 2017-09-23 ENCOUNTER — Telehealth: Payer: Self-pay

## 2017-09-23 ENCOUNTER — Ambulatory Visit: Payer: Self-pay | Admitting: Surgery

## 2017-09-23 DIAGNOSIS — K8010 Calculus of gallbladder with chronic cholecystitis without obstruction: Secondary | ICD-10-CM | POA: Diagnosis not present

## 2017-09-23 NOTE — H&P (Signed)
History of Present Illness Tony Jones. Chesley Valls MD; 09/23/2017 12:23 PM) The patient is a 57 year old male who presents for evaluation of gall stones. PCP - Dr. Kathryne Eriksson Cards - Ellyn Hack GU - [facility] - [place]  This is a 57 year old male with developmental delay, history of Barrett's esophagus status post multiple endoscopic ablations at [facility] who presents with some digestive issues and documented gallstones. The patient was recently hospitalized for hematemesis. He underwent EGD by Dr. Watt Climes on 08/02/17. This showed only some chronic gastritis. The patient has a CT scan from February of this year that showed cholelithiasis with a 14 mm gallstone but no associated inflammatory changes. The patient also has a right ureteral stone that is nonobstructing. Urology is planning a ureteroscopic extraction but wanted to make sure that he does not also need a cholecystectomy.  The patient is accompanied by his mother and his aunt. His father is deceased. The patient is cooperative but does not understand our conversation.  The mother relates that he has had a history recently of increased complaints of abdominal pain. This tends to occur after eating. The patient has a very limited diet and eats mostly macaroni and cheese, hamburgers, and spaghetti. He has had some postprandial nausea and vomiting. He has had a couple of episodes of diarrhea but tends to be more constipated. He has had increased burping and flatulence.  Liver function test on 07/30/17 showed a normal bilirubin at 0.7 with normal AST and ALT.   CLINICAL DATA: Periumbilical pain, diarrhea, nausea.  EXAM: CT ABDOMEN AND PELVIS WITHOUT CONTRAST  TECHNIQUE: Multidetector CT imaging of the abdomen and pelvis was performed following the standard protocol without IV contrast.  COMPARISON: 02/02/2007  FINDINGS: Motion degraded images.  Lower chest: Mosaic attenuation at the lung bases.  Hepatobiliary: Unenhanced liver is  grossly unremarkable.  14 mm gallstone. No associated inflammatory changes.  No intrahepatic or extrahepatic ductal dilatation.  Pancreas: Within normal limits.  Spleen: Within normal limits.  Adrenals/Urinary Tract: Adrenal glands are within normal limits.  13 mm right upper pole renal cyst (series 2/image 31). 3 mm nonobstructing right lower pole renal calculus (series 2/image 41).  Two stacked distal right ureteral calculi at the UVJ measuring 6-7 mm (coronal image 71). Bladder is otherwise unremarkable.  Stomach/Bowel: Stomach is notable for a tiny hiatal hernia.  No evidence of bowel obstruction.  Normal appendix (series 2/image 59).  Mild left colonic stool burden.  Vascular/Lymphatic: No evidence of abdominal aortic aneurysm.  No suspicious abdominopelvic lymphadenopathy. Mild nonspecific jejunal mesenteric stranding.  Reproductive: Prostate is grossly unremarkable.  Other: No abdominopelvic ascites.  Musculoskeletal: Degenerative changes of the visualized thoracolumbar spine.  IMPRESSION: Two distal right ureteral calculi at the UVJ measuring up to 7 mm. Additional 3 mm nonobstructing right lower pole renal calculus. No hydronephrosis.  Cholelithiasis, without associated inflammatory changes.   Electronically Signed By: Julian Hy M.D. On: 06/24/2017 14:17    Diagnostic Studies History Sabino Gasser; 09/23/2017 10:13 AM) Colonoscopy never  Allergies Sabino Gasser; 09/23/2017 10:14 AM) No Known Drug Allergies [09/23/2017]: Allergies Reconciled  Medication History Sabino Gasser; 09/23/2017 10:20 AM) Acetaminophen (650MG  Tablet, Oral) Active. Allopurinol (300MG  Tablet, Oral) Active. Docusate Sodium (100MG  Capsule, Oral) Active. Furosemide (80MG  Tablet, Oral) Active. Glimepiride (2MG  Tablet, Oral) Active. Levocetirizine Dihydrochloride (5MG  Tablet, Oral) Active. Meclizine HCl (25MG  Tablet, Oral) Active. MetFORMIN HCl (1000MG   Tablet, Oral) Active. Metoprolol Succinate ER (25MG  Tablet ER 24HR, Oral) Active. Midodrine HCl (5MG  Tablet, Oral) Active. Oxybutynin Chloride (5MG  Tablet, Oral)  Active. Pantoprazole Sodium (40MG  Tablet DR, Oral) Active. Sertraline HCl (100MG  Tablet, Oral) Active. TiZANidine HCl (2MG  Tablet, Oral) Active. Toviaz (8MG  Tablet ER 24HR, Oral) Active. Vitamin B12 (1000MCG Tablet ER, Oral) Active. Medications Reconciled  Social History Sabino Gasser; 09/23/2017 10:13 AM) Tobacco use Never smoker.  Family History Sabino Gasser; 09/23/2017 10:13 AM) Arthritis Brother, Mother. Cancer Father. Cerebrovascular Accident Mother. Depression Mother. Diabetes Mellitus Brother, Father. Heart Disease Father.  Other Problems Sabino Gasser; 09/23/2017 10:13 AM) Anxiety Disorder Arthritis Cholelithiasis Congestive Heart Failure Diabetes Mellitus Gastroesophageal Reflux Disease Kidney Stone     Review of Systems Sabino Gasser; 09/23/2017 10:13 AM) General Present- Appetite Loss and Fatigue. Not Present- Chills, Fever, Night Sweats, Weight Gain and Weight Loss. Skin Present- Dryness and Rash. Not Present- Change in Wart/Mole, Hives, Jaundice, New Lesions, Non-Healing Wounds and Ulcer. HEENT Present- Hearing Loss. Not Present- Earache, Hoarseness, Nose Bleed, Oral Ulcers, Ringing in the Ears, Seasonal Allergies, Sinus Pain, Sore Throat, Visual Disturbances, Wears glasses/contact lenses and Yellow Eyes. Respiratory Present- Difficulty Breathing. Not Present- Bloody sputum, Chronic Cough, Snoring and Wheezing. Cardiovascular Present- Difficulty Breathing Lying Down, Leg Cramps, Rapid Heart Rate, Shortness of Breath and Swelling of Extremities. Not Present- Chest Pain and Palpitations. Gastrointestinal Present- Abdominal Pain, Excessive gas and Nausea. Not Present- Bloating, Bloody Stool, Change in Bowel Habits, Chronic diarrhea, Constipation, Difficulty Swallowing, Gets full  quickly at meals, Hemorrhoids, Indigestion, Rectal Pain and Vomiting. Male Genitourinary Present- Frequency. Not Present- Blood in Urine, Change in Urinary Stream, Impotence, Nocturia, Painful Urination, Urgency and Urine Leakage. Musculoskeletal Present- Joint Pain and Muscle Weakness. Not Present- Back Pain, Joint Stiffness, Muscle Pain and Swelling of Extremities. Psychiatric Present- Change in Sleep Pattern. Not Present- Anxiety, Bipolar, Depression, Fearful and Frequent crying.  Vitals Sabino Gasser; 09/23/2017 10:21 AM) 09/23/2017 10:20 AM Weight: 207 lb Height: 61in Body Surface Area: 1.92 m Body Mass Index: 39.11 kg/m  Temp.: 97.49F(Oral)  Pulse: 118 (Regular)  BP: 132/84 (Sitting, Left Arm, Standard)      Physical Exam Rodman Key K. Tayden Duran MD; 09/23/2017 12:24 PM)  The physical exam findings are as follows: Note:WDWN in NAD Eyes: Pupils equal, round; sclera anicteric HENT: Oral mucosa moist; good dentition Neck: No masses palpated, no thyromegaly Lungs: CTA bilaterally; normal respiratory effort CV: Regular rate and rhythm; no murmurs; extremities well-perfused with no edema Abd: +bowel sounds, soft, obese, minimal RUQ tenderness; no palpable masses; no HSM Skin: Warm, dry; no sign of jaundice Psychiatric - alert; occasionally agitated but easily redirected by his family members    Assessment & Plan Tony Jones. Keyara Ent MD; 09/23/2017 12:31 PM)  CHRONIC CHOLECYSTITIS WITH CALCULUS (K80.10)  Current Plans Schedule for Surgery Laparoscopic cholecystectomy with intraoperative cholangiogram - The surgical procedure has been discussed with the patient. Potential risks, benefits, alternative treatments, and expected outcomes have been explained. All of the patient's questions at this time have been answered. The likelihood of reaching the patient's treatment goal is good. The patient understand the proposed surgical procedure and wishes to proceed. Note:We will  obtain cardiac clearance from Dr. Ellyn Hack and then coordinate a surgical date with Dr. Diona Fanti for his urologic procedure.  Tony Jones. [name], MD, [facility]  General/ Trauma Surgery  09/23/2017 12:32 PM

## 2017-09-23 NOTE — Telephone Encounter (Signed)
   [facility] Pre-operative Risk Assessment    Request for surgical clearance:  1. What type of surgery is being performed? Laparoscopic Cholecystectomy   2. When is this surgery scheduled? TBD  3. What type of clearance is required (medical clearance vs. Pharmacy clearance to hold med vs. Both)? Medical  4. Are there any medications that need to be held prior to surgery and how long? No meds to hold  5. Practice name and name of physician performing surgery? [facility]  Dr.Matthew Tsuei  6. What is your office phone number [phone]   7.   What is your office fax number [phone]  8.   Anesthesia type (None, local, MAC, general) ? General   Tony Jones 09/23/2017, 4:46 PM  _________________________________________________________________   (provider comments below)

## 2017-09-23 NOTE — Telephone Encounter (Signed)
   [facility] Pre-operative Risk Assessment    Request for surgical clearance:  1. What type of surgery is being performed? Laparoscopic Cholecystectomy  2. When is this surgery scheduled? TBD   3. What type of clearance is required (medical clearance vs. Pharmacy clearance to hold med vs. Both)? Medical  4. Are there any medications that need to be held prior to surgery and how long?N/A   5. Practice name and name of physician performing surgery? [facility]   6. What is your office phone number [phone]    7.   What is your office fax number [phone] ATTN: Mammie Lorenzo  8.   Anesthesia type (None, local, MAC, general) ? Unknown   Tony Jones 09/23/2017, 4:38 PM  _________________________________________________________________   (provider comments below)

## 2017-09-27 NOTE — Telephone Encounter (Signed)
   Pt seen recently in Clinic 09/19/17 by Phill Myron. Lawrence DNP and had acute issues including hypotension, dyspnea and LEE. Echo has been ordered but not yet completed. Echo ordered for 09/28/17. Assessment pending. TBD based on results.

## 2017-09-28 ENCOUNTER — Ambulatory Visit (HOSPITAL_BASED_OUTPATIENT_CLINIC_OR_DEPARTMENT_OTHER): Payer: PPO

## 2017-09-28 ENCOUNTER — Other Ambulatory Visit: Payer: Self-pay

## 2017-09-28 DIAGNOSIS — I509 Heart failure, unspecified: Secondary | ICD-10-CM

## 2017-09-28 DIAGNOSIS — E119 Type 2 diabetes mellitus without complications: Secondary | ICD-10-CM | POA: Insufficient documentation

## 2017-09-28 DIAGNOSIS — R600 Localized edema: Secondary | ICD-10-CM | POA: Diagnosis not present

## 2017-09-28 DIAGNOSIS — I5032 Chronic diastolic (congestive) heart failure: Secondary | ICD-10-CM

## 2017-09-28 DIAGNOSIS — E785 Hyperlipidemia, unspecified: Secondary | ICD-10-CM | POA: Insufficient documentation

## 2017-09-28 DIAGNOSIS — I071 Rheumatic tricuspid insufficiency: Secondary | ICD-10-CM

## 2017-09-28 DIAGNOSIS — I119 Hypertensive heart disease without heart failure: Secondary | ICD-10-CM | POA: Insufficient documentation

## 2017-09-28 DIAGNOSIS — F419 Anxiety disorder, unspecified: Secondary | ICD-10-CM | POA: Insufficient documentation

## 2017-09-29 ENCOUNTER — Telehealth: Payer: Self-pay | Admitting: Cardiology

## 2017-09-29 NOTE — Telephone Encounter (Signed)
See results and re-address if possible today

## 2017-09-29 NOTE — Telephone Encounter (Signed)
I called mom and gave her results and I have updated re-op message to try to re-address clearance today

## 2017-09-29 NOTE — Telephone Encounter (Signed)
Follow up    Pt mother is calling to follow up on this.

## 2017-09-29 NOTE — Telephone Encounter (Signed)
New message    Pt mother is asking a call back from Dr. Ellyn Hack nurse. She said she wants to talk about his clearance for surgery. Informed her I would send follow up message to pre op pool but she said she wanted to talk to nurse because she called this morning and was told the same thing and still hasnt heard anything.

## 2017-09-29 NOTE — Telephone Encounter (Signed)
Pt is not eating much and "really needs surger" per MOM

## 2017-09-29 NOTE — Telephone Encounter (Signed)
Follow up   Please update status of clearance now that echo has been done

## 2017-09-30 ENCOUNTER — Other Ambulatory Visit: Payer: Self-pay

## 2017-09-30 ENCOUNTER — Telehealth: Payer: Self-pay | Admitting: Physician Assistant

## 2017-09-30 ENCOUNTER — Inpatient Hospital Stay (HOSPITAL_COMMUNITY): Payer: PPO

## 2017-09-30 ENCOUNTER — Encounter (HOSPITAL_COMMUNITY): Payer: Self-pay

## 2017-09-30 ENCOUNTER — Inpatient Hospital Stay (HOSPITAL_COMMUNITY)
Admission: AD | Admit: 2017-09-30 | Discharge: 2017-10-13 | DRG: 286 | Disposition: A | Discharge status: Home / Self Care | Payer: PPO | Source: Ambulatory Visit | Attending: Cardiology | Admitting: Cardiology

## 2017-09-30 ENCOUNTER — Inpatient Hospital Stay: Payer: Self-pay

## 2017-09-30 DIAGNOSIS — N179 Acute kidney failure, unspecified: Secondary | ICD-10-CM | POA: Diagnosis not present

## 2017-09-30 DIAGNOSIS — I25119 Atherosclerotic heart disease of native coronary artery with unspecified angina pectoris: Secondary | ICD-10-CM | POA: Diagnosis present

## 2017-09-30 DIAGNOSIS — R6250 Unspecified lack of expected normal physiological development in childhood: Secondary | ICD-10-CM | POA: Diagnosis present

## 2017-09-30 DIAGNOSIS — N183 Chronic kidney disease, stage 3 (moderate): Secondary | ICD-10-CM | POA: Diagnosis present

## 2017-09-30 DIAGNOSIS — J90 Pleural effusion, not elsewhere classified: Secondary | ICD-10-CM | POA: Diagnosis not present

## 2017-09-30 DIAGNOSIS — K8020 Calculus of gallbladder without cholecystitis without obstruction: Secondary | ICD-10-CM | POA: Diagnosis not present

## 2017-09-30 DIAGNOSIS — G4733 Obstructive sleep apnea (adult) (pediatric): Secondary | ICD-10-CM | POA: Diagnosis not present

## 2017-09-30 DIAGNOSIS — D62 Acute posthemorrhagic anemia: Secondary | ICD-10-CM | POA: Diagnosis present

## 2017-09-30 DIAGNOSIS — K2950 Unspecified chronic gastritis without bleeding: Secondary | ICD-10-CM | POA: Diagnosis present

## 2017-09-30 DIAGNOSIS — K219 Gastro-esophageal reflux disease without esophagitis: Secondary | ICD-10-CM | POA: Diagnosis not present

## 2017-09-30 DIAGNOSIS — I50813 Acute on chronic right heart failure: Secondary | ICD-10-CM

## 2017-09-30 DIAGNOSIS — F419 Anxiety disorder, unspecified: Secondary | ICD-10-CM | POA: Diagnosis not present

## 2017-09-30 DIAGNOSIS — E1122 Type 2 diabetes mellitus with diabetic chronic kidney disease: Secondary | ICD-10-CM | POA: Diagnosis present

## 2017-09-30 DIAGNOSIS — Z833 Family history of diabetes mellitus: Secondary | ICD-10-CM

## 2017-09-30 DIAGNOSIS — I130 Hypertensive heart and chronic kidney disease with heart failure and stage 1 through stage 4 chronic kidney disease, or unspecified chronic kidney disease: Secondary | ICD-10-CM | POA: Diagnosis not present

## 2017-09-30 DIAGNOSIS — K921 Melena: Secondary | ICD-10-CM | POA: Diagnosis not present

## 2017-09-30 DIAGNOSIS — I509 Heart failure, unspecified: Secondary | ICD-10-CM | POA: Diagnosis not present

## 2017-09-30 DIAGNOSIS — K2270 Barrett's esophagus without dysplasia: Secondary | ICD-10-CM | POA: Diagnosis not present

## 2017-09-30 DIAGNOSIS — D509 Iron deficiency anemia, unspecified: Secondary | ICD-10-CM | POA: Diagnosis not present

## 2017-09-30 DIAGNOSIS — I50810 Right heart failure, unspecified: Secondary | ICD-10-CM

## 2017-09-30 DIAGNOSIS — I2721 Secondary pulmonary arterial hypertension: Secondary | ICD-10-CM | POA: Diagnosis present

## 2017-09-30 DIAGNOSIS — Z6838 Body mass index (BMI) 38.0-38.9, adult: Secondary | ICD-10-CM | POA: Diagnosis not present

## 2017-09-30 DIAGNOSIS — Z8249 Family history of ischemic heart disease and other diseases of the circulatory system: Secondary | ICD-10-CM

## 2017-09-30 DIAGNOSIS — D649 Anemia, unspecified: Secondary | ICD-10-CM

## 2017-09-30 DIAGNOSIS — G7100 Muscular dystrophy, unspecified: Secondary | ICD-10-CM | POA: Diagnosis not present

## 2017-09-30 DIAGNOSIS — J9621 Acute and chronic respiratory failure with hypoxia: Secondary | ICD-10-CM | POA: Diagnosis not present

## 2017-09-30 DIAGNOSIS — R627 Adult failure to thrive: Secondary | ICD-10-CM | POA: Diagnosis not present

## 2017-09-30 DIAGNOSIS — I270 Primary pulmonary hypertension: Secondary | ICD-10-CM | POA: Diagnosis not present

## 2017-09-30 DIAGNOSIS — K8010 Calculus of gallbladder with chronic cholecystitis without obstruction: Secondary | ICD-10-CM

## 2017-09-30 DIAGNOSIS — Z5329 Procedure and treatment not carried out because of patient's decision for other reasons: Secondary | ICD-10-CM

## 2017-09-30 DIAGNOSIS — I5032 Chronic diastolic (congestive) heart failure: Secondary | ICD-10-CM | POA: Diagnosis not present

## 2017-09-30 DIAGNOSIS — K819 Cholecystitis, unspecified: Secondary | ICD-10-CM

## 2017-09-30 DIAGNOSIS — I272 Pulmonary hypertension, unspecified: Secondary | ICD-10-CM | POA: Diagnosis not present

## 2017-09-30 DIAGNOSIS — E662 Morbid (severe) obesity with alveolar hypoventilation: Secondary | ICD-10-CM | POA: Diagnosis present

## 2017-09-30 DIAGNOSIS — R1110 Vomiting, unspecified: Secondary | ICD-10-CM

## 2017-09-30 DIAGNOSIS — Z823 Family history of stroke: Secondary | ICD-10-CM

## 2017-09-30 DIAGNOSIS — Z9981 Dependence on supplemental oxygen: Secondary | ICD-10-CM

## 2017-09-30 DIAGNOSIS — R5381 Other malaise: Secondary | ICD-10-CM

## 2017-09-30 DIAGNOSIS — R0602 Shortness of breath: Secondary | ICD-10-CM | POA: Diagnosis not present

## 2017-09-30 DIAGNOSIS — I5082 Biventricular heart failure: Secondary | ICD-10-CM | POA: Diagnosis present

## 2017-09-30 DIAGNOSIS — Z87442 Personal history of urinary calculi: Secondary | ICD-10-CM

## 2017-09-30 DIAGNOSIS — I5031 Acute diastolic (congestive) heart failure: Secondary | ICD-10-CM | POA: Diagnosis not present

## 2017-09-30 DIAGNOSIS — I959 Hypotension, unspecified: Secondary | ICD-10-CM | POA: Diagnosis not present

## 2017-09-30 DIAGNOSIS — F99 Mental disorder, not otherwise specified: Secondary | ICD-10-CM

## 2017-09-30 DIAGNOSIS — J9690 Respiratory failure, unspecified, unspecified whether with hypoxia or hypercapnia: Secondary | ICD-10-CM

## 2017-09-30 DIAGNOSIS — R0989 Other specified symptoms and signs involving the circulatory and respiratory systems: Secondary | ICD-10-CM | POA: Diagnosis not present

## 2017-09-30 DIAGNOSIS — Z7984 Long term (current) use of oral hypoglycemic drugs: Secondary | ICD-10-CM | POA: Diagnosis not present

## 2017-09-30 DIAGNOSIS — G8929 Other chronic pain: Secondary | ICD-10-CM | POA: Diagnosis present

## 2017-09-30 DIAGNOSIS — J810 Acute pulmonary edema: Secondary | ICD-10-CM | POA: Diagnosis not present

## 2017-09-30 DIAGNOSIS — E785 Hyperlipidemia, unspecified: Secondary | ICD-10-CM | POA: Diagnosis present

## 2017-09-30 DIAGNOSIS — R188 Other ascites: Secondary | ICD-10-CM | POA: Diagnosis not present

## 2017-09-30 DIAGNOSIS — K5900 Constipation, unspecified: Secondary | ICD-10-CM | POA: Diagnosis present

## 2017-09-30 DIAGNOSIS — Z808 Family history of malignant neoplasm of other organs or systems: Secondary | ICD-10-CM

## 2017-09-30 DIAGNOSIS — D500 Iron deficiency anemia secondary to blood loss (chronic): Secondary | ICD-10-CM | POA: Diagnosis not present

## 2017-09-30 DIAGNOSIS — G903 Multi-system degeneration of the autonomic nervous system: Secondary | ICD-10-CM | POA: Diagnosis not present

## 2017-09-30 DIAGNOSIS — J849 Interstitial pulmonary disease, unspecified: Secondary | ICD-10-CM | POA: Diagnosis not present

## 2017-09-30 DIAGNOSIS — I50811 Acute right heart failure: Secondary | ICD-10-CM

## 2017-09-30 DIAGNOSIS — K760 Fatty (change of) liver, not elsewhere classified: Secondary | ICD-10-CM | POA: Diagnosis present

## 2017-09-30 DIAGNOSIS — J9611 Chronic respiratory failure with hypoxia: Secondary | ICD-10-CM | POA: Diagnosis not present

## 2017-09-30 DIAGNOSIS — Z79899 Other long term (current) drug therapy: Secondary | ICD-10-CM

## 2017-09-30 DIAGNOSIS — I2510 Atherosclerotic heart disease of native coronary artery without angina pectoris: Secondary | ICD-10-CM | POA: Diagnosis not present

## 2017-09-30 DIAGNOSIS — Z5309 Procedure and treatment not carried out because of other contraindication: Secondary | ICD-10-CM | POA: Diagnosis present

## 2017-09-30 DIAGNOSIS — R0902 Hypoxemia: Secondary | ICD-10-CM | POA: Diagnosis not present

## 2017-09-30 HISTORY — DX: Chronic respiratory failure with hypoxia: J96.11

## 2017-09-30 HISTORY — DX: Right heart failure, unspecified: I50.810

## 2017-09-30 HISTORY — DX: Anemia, unspecified: D64.9

## 2017-09-30 HISTORY — DX: Unspecified lack of expected normal physiological development in childhood: R62.50

## 2017-09-30 HISTORY — DX: Pulmonary hypertension, unspecified: I27.20

## 2017-09-30 HISTORY — DX: Calculus of kidney: N20.0

## 2017-09-30 HISTORY — DX: Calculus of gallbladder without cholecystitis without obstruction: K80.20

## 2017-09-30 LAB — GLUCOSE, CAPILLARY
GLUCOSE-CAPILLARY: 138 mg/dL — AB (ref 65–99)
Glucose-Capillary: 103 mg/dL — ABNORMAL HIGH (ref 65–99)

## 2017-09-30 MED ORDER — SODIUM CHLORIDE 0.9 % IV SOLN
INTRAVENOUS | Status: AC
  Administered 2017-09-30: 22:00:00 via INTRAVENOUS

## 2017-09-30 MED ORDER — MIDODRINE HCL 5 MG PO TABS
10.0000 mg | ORAL_TABLET | Freq: Three times a day (TID) | ORAL | Status: DC
  Administered 2017-09-30 – 2017-10-13 (×37): 10 mg via ORAL
  Filled 2017-09-30 (×40): qty 2

## 2017-09-30 MED ORDER — INSULIN ASPART 100 UNIT/ML ~~LOC~~ SOLN
0.0000 [IU] | Freq: Three times a day (TID) | SUBCUTANEOUS | Status: DC
  Administered 2017-09-30 – 2017-10-02 (×3): 1 [IU] via SUBCUTANEOUS
  Administered 2017-10-02 – 2017-10-03 (×2): 2 [IU] via SUBCUTANEOUS
  Administered 2017-10-03 – 2017-10-06 (×6): 1 [IU] via SUBCUTANEOUS
  Administered 2017-10-07 – 2017-10-08 (×3): 2 [IU] via SUBCUTANEOUS
  Administered 2017-10-08: 1 [IU] via SUBCUTANEOUS
  Administered 2017-10-09 (×2): 2 [IU] via SUBCUTANEOUS
  Administered 2017-10-09 – 2017-10-10 (×3): 1 [IU] via SUBCUTANEOUS
  Administered 2017-10-10 – 2017-10-11 (×3): 2 [IU] via SUBCUTANEOUS
  Administered 2017-10-11 – 2017-10-13 (×3): 1 [IU] via SUBCUTANEOUS
  Administered 2017-10-13 (×2): 2 [IU] via SUBCUTANEOUS

## 2017-09-30 MED ORDER — OXYBUTYNIN CHLORIDE 5 MG PO TABS
5.0000 mg | ORAL_TABLET | Freq: Two times a day (BID) | ORAL | Status: DC
  Administered 2017-09-30 – 2017-10-13 (×27): 5 mg via ORAL
  Filled 2017-09-30 (×28): qty 1

## 2017-09-30 MED ORDER — SODIUM CHLORIDE 0.9% FLUSH
3.0000 mL | Freq: Two times a day (BID) | INTRAVENOUS | Status: DC
  Administered 2017-10-01 – 2017-10-02 (×4): 3 mL via INTRAVENOUS
  Administered 2017-10-06: 10 mL via INTRAVENOUS
  Administered 2017-10-09 – 2017-10-13 (×3): 3 mL via INTRAVENOUS

## 2017-09-30 MED ORDER — ONDANSETRON 4 MG PO TBDP
4.0000 mg | ORAL_TABLET | Freq: Three times a day (TID) | ORAL | Status: DC | PRN
  Filled 2017-09-30: qty 1

## 2017-09-30 MED ORDER — ALLOPURINOL 300 MG PO TABS
300.0000 mg | ORAL_TABLET | Freq: Every day | ORAL | Status: DC
  Administered 2017-09-30 – 2017-10-13 (×14): 300 mg via ORAL
  Filled 2017-09-30 (×14): qty 1

## 2017-09-30 MED ORDER — ADULT MULTIVITAMIN W/MINERALS CH
1.0000 | ORAL_TABLET | Freq: Every day | ORAL | Status: DC
  Administered 2017-10-01 – 2017-10-13 (×13): 1 via ORAL
  Filled 2017-09-30 (×14): qty 1

## 2017-09-30 MED ORDER — SODIUM CHLORIDE 0.9% FLUSH
10.0000 mL | INTRAVENOUS | Status: DC | PRN
  Administered 2017-10-07 – 2017-10-12 (×4): 10 mL
  Filled 2017-09-30 (×4): qty 40

## 2017-09-30 MED ORDER — HEPARIN SODIUM (PORCINE) 5000 UNIT/ML IJ SOLN
5000.0000 [IU] | Freq: Three times a day (TID) | INTRAMUSCULAR | Status: DC
  Administered 2017-09-30 – 2017-10-11 (×30): 5000 [IU] via SUBCUTANEOUS
  Filled 2017-09-30 (×31): qty 1

## 2017-09-30 MED ORDER — ACETAMINOPHEN 325 MG PO TABS
650.0000 mg | ORAL_TABLET | ORAL | Status: DC | PRN
  Administered 2017-10-02: 650 mg via ORAL
  Filled 2017-09-30: qty 2

## 2017-09-30 MED ORDER — SERTRALINE HCL 100 MG PO TABS
100.0000 mg | ORAL_TABLET | Freq: Every day | ORAL | Status: DC
  Administered 2017-10-01 – 2017-10-13 (×13): 100 mg via ORAL
  Filled 2017-09-30 (×14): qty 1

## 2017-09-30 MED ORDER — PANTOPRAZOLE SODIUM 40 MG PO TBEC
40.0000 mg | DELAYED_RELEASE_TABLET | Freq: Two times a day (BID) | ORAL | Status: DC
  Administered 2017-09-30 – 2017-10-13 (×27): 40 mg via ORAL
  Filled 2017-09-30 (×28): qty 1

## 2017-09-30 MED ORDER — SODIUM CHLORIDE 0.9 % IV SOLN: 250.0000 mL | INTRAVENOUS | Status: DC | PRN

## 2017-09-30 MED ORDER — DOCUSATE SODIUM 100 MG PO CAPS
100.0000 mg | ORAL_CAPSULE | Freq: Every day | ORAL | Status: DC | PRN
  Administered 2017-10-06 – 2017-10-11 (×4): 100 mg via ORAL
  Filled 2017-09-30 (×4): qty 1

## 2017-09-30 MED ORDER — FESOTERODINE FUMARATE ER 8 MG PO TB24
8.0000 mg | ORAL_TABLET | Freq: Every day | ORAL | Status: DC
  Administered 2017-10-01 – 2017-10-13 (×13): 8 mg via ORAL
  Filled 2017-09-30 (×15): qty 1

## 2017-09-30 MED ORDER — SODIUM CHLORIDE 0.9% FLUSH: 3.0000 mL | INTRAVENOUS | Status: DC | PRN

## 2017-09-30 NOTE — Telephone Encounter (Signed)
Spoke to patient's mother, she is upset of the process of clearing her son for surgery. She states it has been several days for an answer.  RN informed her of the steps of the process and the that will be addressed today. RN informed will call back today.

## 2017-09-30 NOTE — Telephone Encounter (Signed)
Pt's mom calling concerning why he haven't been cleared for surgery. Please advise pt's mom

## 2017-09-30 NOTE — H&P (Addendum)
Cardiology History & Physical    Patient ID: SHAFT CORIGLIANO MRN: [id number], DOB: 1960-11-20 Date of Encounter: 09/30/2017, 7:24 PM Primary Physician: Christain Sacramento, MD Primary Cardiologist: Dr. Ellyn Hack  Chief Complaint: "don't feel well" Reason for Admission: poor oral intake, lower extremity edema Requesting MD: per outpatient phone call to office  HPI: Tony Jones is a 57 y.o. male with complex history with chronic mental impairment since childhood, chronic hypoxia with witnessed apnea (would not be tolerant of CPAP so uses O2), Barrett's esophagus, anemia, anxiety, cholelithasis, nephrolithiasis, DM, dyslipidemia, GERD, HTN, pulmonary HTN and right heart failure who was admitted directly to the hospital for further evaluation of recent malaise and failure to thrive.  Several years ago he presented with intermittent hypoxia of unclear etiology with apneic episodes, but no evidence of pulm HTN in 2016. VQ scan was negative. He was not felt at that time to be a candidate for RHC or sleep study. He was seen by pulm who recommended placement on home O2. More recently when seen in the office 08/2017 he was noted to be hypotensive and edematous. Midodrine was added and Lasix was titrated. There was concern for progression in right heart failure. When seen in f/u 5/20 by NP, hemoglobin remained low (but relatively stable compared to 07/2017 when admitted with melena/hematemesis), and he had evidence of AKI with Cr of 2.05. This was in the setting of his mother giving him additional PRN doses in attempts to help reduce volume. He was advised to continue on same prior regimen (120mg  daily) and f/u PCP for further anemia eval. 2D echo 5/29 showed EF 60-65%, grade 1 DD, high ventricular filling pressure, ventricular contour showed diastolic flattening and systolic flattening consistent with right ventricular pressure and volume overload, mild TR, severely increased PASP of 28mmHg. In interim, has seen general  surgery for his cholelithiasis and reported increased abdominal pain mostly after eating. He has a fairly limited diet in general. He also has been found to have kidney stones and urology is planning a ureteroscopic extraction but wanted to make sure that he does not also need a cholecystectomy. General surgery felt he may benefit from a cholecystectomy so a clearance was sent to our office. When he was contacted today further to discuss how he was feeling and clearance, the patient's mother raised concern for frequent bouts of nausea and vomiting when he moves around, generalized abdominal pain, and continued edema with low blood pressure. He was asked to come to the hospital for admission. [name] is not really able to provide an accurate history as he tends to perseverate on unrelated details. He denies any acute complaints. IV access unable to be obtained x2.    Past Medical History:  Diagnosis Date  . Acute kidney injury ([place])   . Anemia   . Anxiety   . Barrett esophagus   . Cholelithiasis   . Chronic respiratory failure with hypoxia ([place])   . Developmental delay   . Diabetes mellitus type 2, uncontrolled ([place])   . Dyslipidemia   . GERD (gastroesophageal reflux disease)   . Hypertension   . Mental disorder   . Nephrolithiasis   . Pulmonary hypertension ([place])   . Right heart failure [facility])      Surgical History:  Past Surgical History:  Procedure Laterality Date  . ESOPHAGOGASTRODUODENOSCOPY N/A 08/02/2017   Procedure: ESOPHAGOGASTRODUODENOSCOPY (EGD);  Surgeon: [name], MD;  Location: [facility];  Service: Endoscopy;  Laterality: N/A;  . THROAT SURGERY    .  TRANSTHORACIC ECHOCARDIOGRAM  01/2015   Normal LV size.  Mild LVH.  EF 55-60%.  No RWMA.  Questionable diastolic parameters.  Mild RV and RA dilation.     Home Meds: Prior to Admission medications   Medication Sig Start Date End Date Taking? Authorizing Provider  acetaminophen (TYLENOL) 650 MG CR tablet Take 1,300 mg by  mouth daily as needed for pain.   Yes [provider]  allopurinol (ZYLOPRIM) 300 MG tablet Take 300 mg by mouth daily with supper.  01/18/15  Yes [provider]  docusate sodium (COLACE) 100 MG capsule Take 100 mg by mouth daily as needed for mild constipation.    Yes [provider]  fesoterodine (TOVIAZ) 8 MG TB24 tablet Take 8 mg by mouth daily with supper.    Yes [provider]  furosemide (LASIX) 80 MG tablet Take 120 mg by mouth daily with breakfast.    Yes [provider]  glimepiride (AMARYL) 2 MG tablet Take 2 mg by mouth daily with breakfast.   Yes [provider]  levocetirizine (XYZAL) 5 MG tablet Take 5 mg by mouth daily with breakfast.    Yes [provider]  meclizine (ANTIVERT) 25 MG tablet Take 25 mg by mouth 3 (three) times daily as needed for dizziness.  07/18/17  Yes [provider]  metFORMIN (GLUCOPHAGE) 1000 MG tablet Take 1,000 mg by mouth 2 (two) times daily with a meal. 07/08/17  Yes [provider]  metoprolol succinate (TOPROL XL) 25 MG 24 hr tablet Take 1 tablet (25 mg total) by mouth daily. 08/29/17  Yes Leonie Man, MD  midodrine (PROAMATINE) 5 MG tablet Take 1 tablet (5 mg total) by mouth 3 (three) times daily with meals. 09/13/17  Yes Leonie Man, MD  Multiple Vitamin (MULTIVITAMIN WITH MINERALS) TABS tablet Take 1 tablet by mouth daily with breakfast.    Yes [provider]  OVER THE COUNTER MEDICATION Place 1 drop into both eyes daily as needed (dry eyes). Over the counter lubricating eye drop   Yes [provider]  oxybutynin (DITROPAN) 5 MG tablet Take 5 mg by mouth 2 (two) times daily with a meal.    Yes [provider]  OXYGEN Inhale 3 L into the lungs continuous.   Yes [provider]  pantoprazole (PROTONIX) 40 MG tablet Take 40 mg by mouth 2 (two) times daily with a meal.    Yes [provider]  sertraline (ZOLOFT) 100 MG tablet  Take 100 mg by mouth daily with breakfast. 05/22/17  Yes [provider]  tiZANidine (ZANAFLEX) 2 MG tablet Take 1 mg by mouth every 6 (six) hours as needed (leg cramps).  01/16/16  Yes [provider]  vitamin B-12 (CYANOCOBALAMIN) 500 MCG tablet Take 2 tablets (1,000 mcg total) by mouth daily. Patient not taking: Reported on 09/30/2017 02/03/15   Orson Eva, MD    Allergies:  Allergies  Allergen Reactions  . Codeine Nausea And Vomiting    Social History   Socioeconomic History  . Marital status: Single    Spouse name: Not on file  . Number of children: Not on file  . Years of education: Not on file  . Highest education level: Not on file  Occupational History  . Occupation: unemployed  Social Needs  . Financial resource strain: Not on file  . Food insecurity:    Worry: Not on file    Inability: Not on file  . Transportation needs:  Medical: Not on file    Non-medical: Not on file  Tobacco Use  . Smoking status: Never Smoker  . Smokeless tobacco: Never Used  Substance and Sexual Activity  . Alcohol use: No  . Drug use: No  . Sexual activity: Never  Lifestyle  . Physical activity:    Days per week: Not on file    Minutes per session: Not on file  . Stress: Not on file  Relationships  . Social connections:    Talks on phone: Not on file    Gets together: Not on file    Attends religious service: Not on file    Active member of club or organization: Not on file    Attends meetings of clubs or organizations: Not on file    Relationship status: Not on file  . Intimate partner violence:    Fear of current or ex partner: Not on file    Emotionally abused: Not on file    Physically abused: Not on file    Forced sexual activity: Not on file  Other Topics Concern  . Not on file  Social History Narrative  . Not on file     Family History  Problem Relation Age of Onset  . Congestive Heart Failure Father   . Heart disease Father   . Heart attack  Father   . Atrial fibrillation Father   . Brain cancer Father   . Melanoma Father   . Diabetes Mother   . Stroke Mother   . Hypertension Mother   . Fainting Paternal Grandfather   . Congestive Heart Failure Paternal Grandfather   . Atrial fibrillation Paternal Grandfather     Review of Systems: Limited by patient's mental status  Radiology/Studies:  [place] Ekg Site Rite  Result Date: 09/30/2017 If [employer] not attached, placement could not be confirmed due to current cardiac rhythm.  Wt Readings from Last 3 Encounters:  09/30/17 204 lb 9.6 oz (92.8 kg)  09/19/17 207 lb 3.2 oz (94 kg)  08/29/17 205 lb 6.4 oz (93.2 kg)    EKG: NSR 98bpm possible LAE, possible RVH, diffuse STT changes similar to prior  Physical Exam: Blood pressure 99/69, pulse 99, temperature 98 F (36.7 C), temperature source Oral, resp. rate 18, height 5\' 1"  (1.549 m), weight 204 lb 9.6 oz (92.8 kg), SpO2 96 %. Body mass index is 38.66 kg/m. General: Morbidly obese chronically ill pale WM in no acute distress. Laying flat in bed without respiratory distress Head: Normocephalic, atraumatic, sclera non-icteric, no xanthomas, nares are without discharge.  Neck: Negative for carotid bruits. JVD not elevated. Lungs: Clear bilaterally to auscultation without wheezes, rales, or rhonchi. Breathing is unlabored. Heart: RRR with S1 S2. No murmurs, rubs, or gallops appreciated. Abdomen: Rounded with normoactive bowel sounds. No hepatomegaly. No rebound/guarding. No obvious abdominal masses. Msk:  Strength and tone appear normal for age. Extremities: No clubbing or cyanosis. 2+ pitting pale lower extremity edema edema.  Distal pedal pulses are 2+ and equal bilaterally. Neuro: Follows commands but very simple minded. No facial asymmetry. Moves all extremities spontaneously. Psych:  Responds to questions with a normal affect, good spirits    Assessment and Plan   1. Progressive failure to thrive with nausea,  vomiting, abdominal pain, lower extremity edema 2. Chronic cholelithasis/nephrolithiasis 3. Recent acute kidney injury 4. Chronic anemia (generally progressive since 2016) 5. Pulmonary HTN with chronic hypoxia and apneic episodes and acute on chronic right heart failure 6. Mental impairment  Medically complex  patient. Dr. Ellyn Hack performed the history and physical at bedside and I was present. The whole clinical picture is not really clear. Dr. Ellyn Hack is concerned that perhaps his progressive pulmonary HTN is playing a role in his recent abdominal complaints. He cannot be easily diuresed given his low blood pressure. Per our discussion, Dr. Ellyn Hack recommends we hold his home diuretics and beta blocker tonight, titrate midodrine to 10mg  TID, and start low dose IV fluids in the setting of his poor oral intake. He recommends to obtain a panel of labs including CMET, CBC, one troponin, BNP, anemia panel, check CXR and KUB, soft diet, and place PICC. Hold oral hypoglycemics (including metformin) and add CBG checks/SSI. Dr. Ellyn Hack and the patient's mother both agree renal insufficiency is not really an issue when it comes to PICC because they do not feel he would be a candidate for HD if it should ever come to that. Dr. Ellyn Hack will reach out to the CHF team this weekend to take a look to help guide further therapies, I.e. RHC. May need to involve surgery this admission as well for input on timing of surgery, but need to stabilize his cardiac/pulmonary vascular status first. Dr. Ellyn Hack discussed code status at bedside and the patient and mother wish him to be DNR. See below for further thoughts from Dr. Ellyn Hack.  Severity of Illness: The appropriate patient status for this patient is INPATIENT. Inpatient status is judged to be reasonable and necessary in order to provide the required intensity of service to ensure the patient's safety. The patient's presenting symptoms, physical exam findings, and initial  radiographic and laboratory data in the context of their chronic comorbidities is felt to place them at high risk for further clinical deterioration. Furthermore, it is not anticipated that the patient will be medically stable for discharge from the hospital within 2 midnights of admission. The following factors support the patient status of inpatient.   " The patient's presenting symptoms include abdominal pain, nausea, vomiting, failure to thrive, swelling. " The worrisome physical exam findings include edema, hypotension, tachycardia. " The initial radiographic and laboratory data are worrisome because of prior renal insufficiency and anemia. " The chronic co-morbidities include listed above. pulm HTN, mental impairment, recent AKI.   * I certify that at the point of admission it is my clinical judgment that the patient will require inpatient hospital care spanning beyond 2 midnights from the point of admission due to high intensity of service, high risk for further deterioration and high frequency of surveillance required.*    For questions or updates, please contact [place] Please consult [email or web address] for contact info under Cardiology/STEMI.  Signed, Charlie Pitter, PA-C 09/30/2017, 7:24 PM

## 2017-09-30 NOTE — Progress Notes (Signed)
Spoke to [employer] (Camera operator) about the 2 unsuccessful PIV attempts. At this time there is not any IV fluids ordered. She will contact the MD to determine next steps to intravenous access (midline or PICC), once determined an IVT consult will be placed. [employer]

## 2017-09-30 NOTE — Telephone Encounter (Signed)
Spoke with patient's mother extensively.  Mr. Lucchese is not able to eat.  He is taking in liquids but still having frequent bouts of nausea and vomiting whenever he gets up and moves around.  He is also having a great deal of abdominal pain.  Systolic blood pressure was in the 90s today.  He is compliant with his medications.  He is currently taking Lasix 80 mg, 2 tablets in the morning.  He was taking 1 tablet in the afternoon but that was stopped because his renal function was worsening.  His volume status is no better, his weight has not come down at all.  I spoke with Dr. Ellyn Hack.  Because of his hypotension, plus the ongoing nausea and vomiting, it is concerning that we may not be able to pull fluid off as an outpatient because his blood pressure will not tolerate higher dose of diuretics.  We might be able to get some results if we switched him to torsemide, but I am not sure his blood pressure will tolerate it.  The best option seems to be to admit him to the hospital, diurese him and is his baseline medical status so that he can get his gallbladder out.  Of note, his chronic hypoxia may not improve much with diuresis.  He had pulmonary hypertension on his recent echo with a PAS of 75. He may need a right heart cath to evaluate his right heart pressures once his volume status is optimized.  Good control was contacted.  Once a bed was available, I contacted the patient's mother and she will bring him in.  Rosaria Ferries, PA-C 09/30/2017 2:38 PM

## 2017-09-30 NOTE — Progress Notes (Signed)
Peripherally Inserted Central Catheter/Midline Placement  The IV Nurse has discussed with the patient and/or persons authorized to consent for the patient, the purpose of this procedure and the potential benefits and risks involved with this procedure.  The benefits include less needle sticks, lab draws from the catheter, and the patient may be discharged home with the catheter. Risks include, but not limited to, infection, bleeding, blood clot (thrombus formation), and puncture of an artery; nerve damage and irregular heartbeat and possibility to perform a PICC exchange if needed/ordered by physician.  Alternatives to this procedure were also discussed.  [employer] PICC patient education guide, fact sheet on infection prevention and patient information card has been provided to patient /or left at bedside.    PICC/Midline Placement Documentation  PICC Double Lumen 17/49/44 PICC Right Basilic 42 cm 0 cm (Active)  Indication for Insertion or Continuance of Line Poor Vasculature-patient has had multiple peripheral attempts or PIVs lasting less than 24 hours 09/30/2017  9:25 PM  Exposed Catheter (cm) 0 cm 09/30/2017  9:25 PM  Site Assessment Clean;Dry;Intact 09/30/2017  9:25 PM  Lumen #1 Status Blood return noted;Flushed;[place] locked 09/30/2017  9:25 PM  Lumen #2 Status Blood return noted;Flushed;[place] locked 09/30/2017  9:25 PM  Dressing Type Transparent;Occlusive 09/30/2017  9:25 PM  Dressing Status Clean;Dry;Intact;Antimicrobial disc in place 09/30/2017  9:25 PM  Dressing Intervention New dressing 09/30/2017  9:25 PM  Dressing Change Due 10/07/17 09/30/2017  9:25 PM       Aldona Lento L 09/30/2017, 9:44 PM

## 2017-09-30 NOTE — Telephone Encounter (Signed)
I sent to to pre-op for review for surgical clearance because Dr L is out of town until 10-10-17 and will be unable to address until then. Thank you

## 2017-09-30 NOTE — Telephone Encounter (Signed)
Late entry--Rhonda barrett PA spoke to [name].

## 2017-09-30 NOTE — Telephone Encounter (Signed)
Spoke with patient's mother extensively.  Tony Jones is not able to eat.  He is taking in liquids but still having frequent bouts of nausea and vomiting whenever he gets up and moves around.  He is also having a great deal of abdominal pain.  Systolic blood pressure was in the 90s today.  He is compliant with his medications.  He is currently taking Lasix 80 mg, 2 tablets in the morning.  He was taking 1 tablet in the afternoon but that was stopped because his renal function was worsening.  His volume status is no better, his weight has not come down at all.  I spoke with Dr. Ellyn Hack.  Because of his hypotension, plus the ongoing nausea and vomiting, it is concerning that we may not be able to pull fluid off as an outpatient because his blood pressure will not tolerate higher dose of diuretics.  We might be able to get some results if we switched him to torsemide, but I am not sure his blood pressure will tolerate it.  The best option seems to be to admit him to the hospital, diurese him and is his baseline medical status so that he can get his gallbladder out.  Of note, his chronic hypoxia may not improve much with diuresis.  He had pulmonary hypertension on his recent echo with a PAS of 75. He may need a right heart cath to evaluate his right heart pressures once his volume status is optimized.  Good control was contacted.  Once a bed was available, I contacted the patient's mother and she will bring him in.  Tony Ferries, PA-C 09/30/2017 2:38 PM

## 2017-10-01 ENCOUNTER — Inpatient Hospital Stay (HOSPITAL_COMMUNITY): Payer: PPO

## 2017-10-01 DIAGNOSIS — I50811 Acute right heart failure: Secondary | ICD-10-CM

## 2017-10-01 DIAGNOSIS — I50810 Right heart failure, unspecified: Secondary | ICD-10-CM

## 2017-10-01 LAB — RETICULOCYTES
RBC.: 4.02 MIL/uL — AB (ref 4.22–5.81)
RETIC CT PCT: 2.3 % (ref 0.4–3.1)
Retic Count, Absolute: 92.5 10*3/uL (ref 19.0–186.0)

## 2017-10-01 LAB — CBC WITH DIFFERENTIAL/PLATELET
BASOS ABS: 0.1 10*3/uL (ref 0.0–0.1)
Basophils Relative: 1 %
EOS ABS: 0.1 10*3/uL (ref 0.0–0.7)
Eosinophils Relative: 1 %
HCT: 30.2 % — ABNORMAL LOW (ref 39.0–52.0)
HEMOGLOBIN: 8.1 g/dL — AB (ref 13.0–17.0)
LYMPHS ABS: 0.6 10*3/uL — AB (ref 0.7–4.0)
Lymphocytes Relative: 8 %
MCH: 20.1 pg — AB (ref 26.0–34.0)
MCHC: 26.8 g/dL — AB (ref 30.0–36.0)
MCV: 75.1 fL — ABNORMAL LOW (ref 78.0–100.0)
MONO ABS: 0.4 10*3/uL (ref 0.1–1.0)
Monocytes Relative: 5 %
NEUTROS ABS: 5.9 10*3/uL (ref 1.7–7.7)
Neutrophils Relative %: 85 %
PLATELETS: 186 10*3/uL (ref 150–400)
RBC: 4.02 MIL/uL — AB (ref 4.22–5.81)
RDW: 20.3 % — AB (ref 11.5–15.5)
WBC: 7.1 10*3/uL (ref 4.0–10.5)

## 2017-10-01 LAB — BASIC METABOLIC PANEL
Anion gap: 11 (ref 5–15)
BUN: 32 mg/dL — AB (ref 6–20)
CALCIUM: 7.6 mg/dL — AB (ref 8.9–10.3)
CO2: 35 mmol/L — ABNORMAL HIGH (ref 22–32)
CREATININE: 1.51 mg/dL — AB (ref 0.61–1.24)
Chloride: 93 mmol/L — ABNORMAL LOW (ref 101–111)
GFR calc Af Amer: 58 mL/min — ABNORMAL LOW (ref >60)
GFR, EST NON AFRICAN AMERICAN: 50 mL/min — AB (ref >60)
GLUCOSE: 86 mg/dL (ref 65–99)
POTASSIUM: 3.1 mmol/L — AB (ref 3.5–5.1)
Sodium: 139 mmol/L (ref 135–145)

## 2017-10-01 LAB — GLUCOSE, CAPILLARY
GLUCOSE-CAPILLARY: 158 mg/dL — AB (ref 65–99)
Glucose-Capillary: 84 mg/dL (ref 65–99)
Glucose-Capillary: 146 mg/dL — ABNORMAL HIGH (ref 65–99)
Glucose-Capillary: 150 mg/dL — ABNORMAL HIGH (ref 65–99)

## 2017-10-01 LAB — TROPONIN I: Troponin I: 0.03 ng/mL (ref <0.03)

## 2017-10-01 LAB — COMPREHENSIVE METABOLIC PANEL
ALBUMIN: 3.2 g/dL — AB (ref 3.5–5.0)
ALK PHOS: 141 U/L — AB (ref 38–126)
ALT: 28 U/L (ref 17–63)
ANION GAP: 11 (ref 5–15)
AST: 22 U/L (ref 15–41)
BUN: 36 mg/dL — ABNORMAL HIGH (ref 6–20)
CALCIUM: 7.6 mg/dL — AB (ref 8.9–10.3)
CO2: 36 mmol/L — AB (ref 22–32)
Chloride: 94 mmol/L — ABNORMAL LOW (ref 101–111)
Creatinine, Ser: 1.61 mg/dL — ABNORMAL HIGH (ref 0.61–1.24)
GFR calc Af Amer: 54 mL/min — ABNORMAL LOW (ref >60)
GFR calc non Af Amer: 46 mL/min — ABNORMAL LOW (ref >60)
Glucose, Bld: 139 mg/dL — ABNORMAL HIGH (ref 65–99)
POTASSIUM: 3.5 mmol/L (ref 3.5–5.1)
SODIUM: 141 mmol/L (ref 135–145)
TOTAL PROTEIN: 6.6 g/dL (ref 6.5–8.1)
Total Bilirubin: 1 mg/dL (ref 0.3–1.2)

## 2017-10-01 LAB — LIPASE, BLOOD: LIPASE: 63 U/L — AB (ref 11–51)

## 2017-10-01 LAB — PROTIME-INR
INR: 1.19
PROTHROMBIN TIME: 15.1 s (ref 11.4–15.2)

## 2017-10-01 LAB — TSH: TSH: 1.985 u[IU]/mL (ref 0.350–4.500)

## 2017-10-01 LAB — IRON AND TIBC
Iron: 16 ug/dL — ABNORMAL LOW (ref 45–182)
SATURATION RATIOS: 4 % — AB (ref 17.9–39.5)
TIBC: 420 ug/dL (ref 250–450)
UIBC: 404 ug/dL

## 2017-10-01 LAB — BRAIN NATRIURETIC PEPTIDE: B Natriuretic Peptide: 928.6 pg/mL — ABNORMAL HIGH (ref 0.0–100.0)

## 2017-10-01 LAB — VITAMIN B12: VITAMIN B 12: 366 pg/mL (ref 180–914)

## 2017-10-01 LAB — FERRITIN: Ferritin: 22 ng/mL — ABNORMAL LOW (ref 24–336)

## 2017-10-01 LAB — FOLATE: Folate: 66.9 ng/mL (ref >5.9)

## 2017-10-01 MED ORDER — FUROSEMIDE 10 MG/ML IJ SOLN
80.0000 mg | Freq: Two times a day (BID) | INTRAMUSCULAR | Status: DC
  Administered 2017-10-01 – 2017-10-04 (×6): 80 mg via INTRAVENOUS
  Filled 2017-10-01 (×6): qty 8

## 2017-10-01 MED ORDER — POTASSIUM CHLORIDE CRYS ER 20 MEQ PO TBCR
40.0000 meq | EXTENDED_RELEASE_TABLET | Freq: Once | ORAL | Status: AC
  Administered 2017-10-01: 40 meq via ORAL
  Filled 2017-10-01: qty 2

## 2017-10-01 MED ORDER — FUROSEMIDE 10 MG/ML IJ SOLN: 60.0000 mg | Freq: Two times a day (BID) | INTRAMUSCULAR | Status: DC

## 2017-10-01 MED ORDER — SODIUM CHLORIDE 0.9 % IV SOLN
510.0000 mg | Freq: Once | INTRAVENOUS | Status: AC
  Administered 2017-10-01: 510 mg via INTRAVENOUS
  Filled 2017-10-01: qty 17

## 2017-10-01 NOTE — Plan of Care (Signed)
  Problem: Nutrition: Goal: Adequate nutrition will be maintained Outcome: Completed/Met   Problem: Coping: Goal: Level of anxiety will decrease Outcome: Completed/Met   Problem: Elimination: Goal: Will not experience complications related to bowel motility Outcome: Completed/Met   Problem: Pain Managment: Goal: General experience of comfort will improve Outcome: Completed/Met   Problem: Safety: Goal: Ability to remain free from injury will improve Outcome: Completed/Met   Problem: Skin Integrity: Goal: Risk for impaired skin integrity will decrease Outcome: Completed/Met

## 2017-10-01 NOTE — Consult Note (Signed)
Advanced Heart Failure Team Consult Note   Primary Physician: Christain Sacramento, MD PCP-Cardiologist:  No primary care provider on file.  Reason for Consultation: RV failure  HPI:    Tony Jones is seen today for evaluation of RV failure/pulmonary hypertension at the request of Dr. Ellyn Hack.   57 yo with history of muscular dystrophy and developmental delay as well as chronic hypoxemic respiratory failure, RV failure/pulmonary hypertension, nephrolithiasis, and symptomatic cholelithiasis was admitted with failure to thrive.   Patient has had a number of issues recently.  He has had a lot of abdominal pain, nausea and vomiting.  He has nephrolithiasis and cholelithiasis, thought to be symptomatic cholelithiasis.  Urology has wanted to do a ureteroscopic stone extraction and surgery has wanted to do a cholecystectomy when he is cleared from a cardiac standpoint.   His mother gives history.  Patient mainly answers yes/no questions.  He has felt weak/fatigued along with nausea/abdominal pain for a couple of weeks.  He does very little walking, but he has been short of breath with minimal exertion.  At baseline, he has been wearing home oxygen 2 L for about 2 years.  He is thought to need CPAP but has never had a sleep study.  Last echo in 5/19 showed normal LV EF with flattened septum and severe pulmonary hypertension, concerning for RV failure.  He had been seen in our office for volume overload but developed AKI with escalating diuretic doses . Creatinine down to 1.5 today.   Review of Systems: All systems reviewed and negative except as per HPI.   Home Medications Prior to Admission medications   Medication Sig Start Date End Date Taking? Authorizing Provider  acetaminophen (TYLENOL) 650 MG CR tablet Take 1,300 mg by mouth daily as needed for pain.   Yes [provider]  allopurinol (ZYLOPRIM) 300 MG tablet Take 300 mg by mouth daily with supper.  01/18/15  Yes [provider]  docusate sodium (COLACE) 100 MG capsule Take 100 mg by mouth daily as needed for mild constipation.    Yes [provider]  fesoterodine (TOVIAZ) 8 MG TB24 tablet Take 8 mg by mouth daily with supper.    Yes [provider]  furosemide (LASIX) 80 MG tablet Take 120 mg by mouth daily with breakfast.    Yes [provider]  glimepiride (AMARYL) 2 MG tablet Take 2 mg by mouth daily with breakfast.   Yes [provider]  levocetirizine (XYZAL) 5 MG tablet Take 5 mg by mouth daily with breakfast.    Yes [provider]  meclizine (ANTIVERT) 25 MG tablet Take 25 mg by mouth 3 (three) times daily as needed for dizziness.  07/18/17  Yes [provider]  metFORMIN (GLUCOPHAGE) 1000 MG tablet Take 1,000 mg by mouth 2 (two) times daily with a meal. 07/08/17  Yes [provider]  metoprolol succinate (TOPROL XL) 25 MG 24 hr tablet Take 1 tablet (25 mg total) by mouth daily. 08/29/17  Yes Leonie Man, MD  midodrine (PROAMATINE) 5 MG tablet Take 1 tablet (5 mg total) by mouth 3 (three) times daily with meals. 09/13/17  Yes Leonie Man, MD  Multiple Vitamin (MULTIVITAMIN WITH MINERALS) TABS tablet Take 1 tablet by mouth daily with breakfast.    Yes [provider]  OVER THE COUNTER MEDICATION Place 1 drop into both eyes daily as needed (dry eyes). Over the counter lubricating eye drop   Yes [provider]  oxybutynin (DITROPAN) 5 MG tablet Take 5 mg by mouth 2 (two) times daily with a meal.    Yes [provider]  OXYGEN Inhale 3 L into the lungs continuous.   Yes [provider]  pantoprazole (PROTONIX) 40 MG tablet Take 40 mg by mouth 2 (two) times daily with a meal.    Yes [provider]  sertraline (ZOLOFT) 100 MG tablet Take 100 mg by mouth daily with breakfast. 05/22/17  Yes [provider]  tiZANidine (ZANAFLEX) 2 MG tablet Take 1 mg by mouth every 6 (six) hours as needed (leg  cramps).  01/16/16  Yes [provider]  vitamin B-12 (CYANOCOBALAMIN) 500 MCG tablet Take 2 tablets (1,000 mcg total) by mouth daily. Patient not taking: Reported on 09/30/2017 02/03/15   Orson Eva, MD    Past Medical History: 1. Undefined muscular dystrophy 2. Developmental delay/mental impairment.  3. Chronic hypoxemic respiratory failure: On home oxygen.  Concern for OSA, has not had sleep study.  - V/Q scan 9/16 with no evidence for chronic PE.  4. RV failure/pulmonary hypertension: In setting of chronic hypoxemic respiratory failure.  V/Q scan in 9/16 without chronic PE.  - Echo (5/19): EF 60-65%, mild LVH, flattened IV septum suggestive of RV pressure/volume overload, PASP 75 mmHg.  5. Chronic Fe deficiency anemia.  6. GERD with Barrett's esophagus 7. Type II diabetes 8. Nephrolithiasis 9. Symptomatic cholelithiasis 10. Hyperlipidemia   Past Surgical History: Past Surgical History:  Procedure Laterality Date  . ESOPHAGOGASTRODUODENOSCOPY N/A 08/02/2017   Procedure: ESOPHAGOGASTRODUODENOSCOPY (EGD);  Surgeon: [name], MD;  Location: [facility];  Service: Endoscopy;  Laterality: N/A;  . THROAT SURGERY    . TRANSTHORACIC ECHOCARDIOGRAM  01/2015   Normal LV size.  Mild LVH.  EF 55-60%.  No RWMA.  Questionable diastolic parameters.  Mild RV and RA dilation.    Family History: Family History  Problem Relation Age of Onset  . Congestive Heart Failure Father   . Heart disease Father   . Heart attack Father   . Atrial fibrillation Father   . Brain cancer Father   . Melanoma Father   . Diabetes Mother   . Stroke Mother   . Hypertension Mother   . Fainting Paternal Grandfather   . Congestive Heart Failure Paternal Grandfather   . Atrial fibrillation Paternal Grandfather     Social History: Social History   Socioeconomic History  . Marital status: Single    Spouse name: Not on file  . Number of children: Not on file  . Years of education: Not on file  .  Highest education level: Not on file  Occupational History  . Occupation: unemployed  Social Needs  . Financial resource strain: Not on file  . Food insecurity:    Worry: Not on file    Inability: Not on file  . Transportation needs:    Medical: Not on file    Non-medical: Not on file  Tobacco Use  . Smoking status: Never Smoker  . Smokeless tobacco: Never Used  Substance and Sexual Activity  . Alcohol use: No  . Drug use: No  . Sexual activity: Never  Lifestyle  . Physical activity:    Days per week: Not on file    Minutes per session: Not on file  . Stress: Not on file  Relationships  . Social connections:    Talks on phone: Not on file    Gets together: Not on file    Attends religious service: Not  on file    Active member of club or organization: Not on file    Attends meetings of clubs or organizations: Not on file    Relationship status: Not on file  Other Topics Concern  . Not on file  Social History Narrative  . Not on file    Allergies:  Allergies  Allergen Reactions  . Codeine Nausea And Vomiting    Objective:    Vital Signs:   Temp:  [97.9 F (36.6 C)-98.2 F (36.8 C)] 98 F (36.7 C) (06/01 1246) Pulse Rate:  [91-99] 91 (06/01 1246) Resp:  [18-20] 20 (06/01 1246) BP: (99-112)/(69-80) 110/76 (06/01 1246) SpO2:  [96 %-100 %] 100 % (06/01 1246) Weight:  [200 lb 9.6 oz (91 kg)-204 lb 9.6 oz (92.8 kg)] 200 lb 9.6 oz (91 kg) (06/01 0556) Last BM Date: 09/29/17  Weight change: Filed Weights   09/30/17 1617 10/01/17 0556  Weight: 204 lb 9.6 oz (92.8 kg) 200 lb 9.6 oz (91 kg)    Intake/Output:   Intake/Output Summary (Last 24 hours) at 10/01/2017 1504 Last data filed at 10/01/2017 1457 Gross per 24 hour  Intake 727.5 ml  Output 1200 ml  Net -472.5 ml      Physical Exam    General: NAD Neck: JVP difficult but appears elevated, no thyromegaly or thyroid nodule.  Lungs: Decreased breath sounds throughout.   CV: Nonpalpable PMI.  Heart regular  S1/S2, no S3/S4, no murmur.  1+ edema to knees bilaterally.  No carotid bruit.  Normal pedal pulses.  Abdomen: Soft, nontender, no hepatosplenomegaly, mild distention.  Skin: Intact without lesions or rashes.  Neurologic: Alert and oriented x 3.  Psych: Normal affect. Extremities: No clubbing or cyanosis.  HEENT: Normal.    Telemetry   NSR, personally reviewed  EKG    NSR, RVH, inferior TWIs (personally reviewed)  Labs   Basic Metabolic Panel: Recent Labs  Lab 09/30/17 2317 10/01/17 0355  NA 141 139  K 3.5 3.1*  CL 94* 93*  CO2 36* 35*  GLUCOSE 139* 86  BUN 36* 32*  CREATININE 1.61* 1.51*  CALCIUM 7.6* 7.6*    Liver Function Tests: Recent Labs  Lab 09/30/17 2317  AST 22  ALT 28  ALKPHOS 141*  BILITOT 1.0  PROT 6.6  ALBUMIN 3.2*   Recent Labs  Lab 09/30/17 2317  LIPASE 63*   No results for input(s): AMMONIA in the last 168 hours.  CBC: Recent Labs  Lab 09/30/17 2317  WBC 7.1  NEUTROABS 5.9  HGB 8.1*  HCT 30.2*  MCV 75.1*  PLT 186    Cardiac Enzymes: Recent Labs  Lab 09/30/17 2317  TROPONINI 0.03*    BNP: BNP (last 3 results) Recent Labs    07/30/17 0950 09/30/17 2317  BNP 290.8* 928.6*    ProBNP (last 3 results) No results for input(s): PROBNP in the last 8760 hours.   CBG: Recent Labs  Lab 09/30/17 1622 09/30/17 2205 10/01/17 0757 10/01/17 1133  GLUCAP 103* 138* 84 146*    Coagulation Studies: Recent Labs    09/30/17 2317  LABPROT 15.1  INR 1.19     Imaging   Dg Chest 2 View  Result Date: 10/01/2017 CLINICAL DATA:  Acute onset of generalized malaise. EXAM: CHEST - 2 VIEW COMPARISON:  Chest radiograph performed 07/30/2017 FINDINGS: The lungs are hypoexpanded. Vascular congestion is noted. Increased interstitial markings raise concern for pulmonary edema, though superimposed left basilar pneumonia cannot be excluded. A small right pleural effusion is noted.  There is no evidence of pneumothorax. The heart is mildly  enlarged. No acute osseous abnormalities are seen. A right PICC is noted ending about the distal SVC. IMPRESSION: Lungs hypoexpanded. Vascular congestion and mild cardiomegaly. Increased interstitial markings raise concern for pulmonary edema, though superimposed left basilar pneumonia cannot be excluded. Small right pleural effusion noted. Electronically Signed   By: Garald Balding M.D.   On: 10/01/2017 00:12   Dg Abd 1 View  Result Date: 10/01/2017 CLINICAL DATA:  Acute onset of vomiting. EXAM: ABDOMEN - 1 VIEW COMPARISON:  Abdominal radiograph performed 09/21/2017 FINDINGS: The visualized bowel gas pattern is unremarkable. Scattered air and stool filled loops of colon are seen; no abnormal dilatation of small bowel loops is seen to suggest small bowel obstruction. No free intra-abdominal air is identified, though evaluation for free air is limited on a single supine view. The visualized osseous structures are within normal limits; the sacroiliac joints are unremarkable in appearance. Small bilateral pleural effusions are noted. Increased interstitial markings may reflect mild interstitial edema. IMPRESSION: 1. Unremarkable bowel gas pattern; no free intra-abdominal air seen. Small to moderate amount of stool noted in the colon. 2. Small bilateral pleural effusions noted. Increased interstitial markings may reflect mild interstitial edema. Electronically Signed   By: Garald Balding M.D.   On: 10/01/2017 00:12   [place] Ekg Site Rite  Result Date: 09/30/2017 If Site Rite image not attached, placement could not be confirmed due to current cardiac rhythm.     Medications:     Current Medications: . allopurinol  300 mg Oral Q supper  . fesoterodine  8 mg Oral Q supper  . furosemide  80 mg Intravenous BID  . heparin  5,000 Units Subcutaneous Q8H  . insulin aspart  0-9 Units Subcutaneous TID WC  . midodrine  10 mg Oral TID WC  . multivitamin with minerals  1 tablet Oral Q breakfast  . oxybutynin  5 mg  Oral BID WC  . pantoprazole  40 mg Oral BID WC  . sertraline  100 mg Oral Q breakfast  . sodium chloride flush  3 mL Intravenous Q12H     Infusions: . sodium chloride    . ferumoxytol        Assessment/Plan   1. RV failure/pulmonary HTN: I suspect the patient has severe RV failure in the setting of severe pulmonary hypertension.  Echo showed flattened septum and severe pulmonary hypertension. 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  Cause of this is uncertain, possible OHS/OSA.  He has not had a sleep study to diagnosis OSA but it is strongly suspected.  V/Q scan in 2016 did not suggest chronic PE. I think group 1 PH is probably unlikely. Hypotension with need for midodrine is concerning.  - Patient has a PICC line.  Given difficult exam for volume, I will transfer him to step down so CVP can be monitored.  - Stop IV fluid, start Lasix 80 mg IV bid.  - Continue midodrine 10 mg tid to maintain BP.  2. Chronic hypoxemic respiratory failure: Uncertain etiology, as above suspect OHS/OSA.   - I will get a high resolution CT chest to rule out interstitial lung disease.  - Continue oxygen by [place].  3. CKD: Stage 3.  Creatinine stable currently. Diurese as above.  4. Cholelithiasis: Abdomen is not tender today and he was able to eat meals today.  Has had symptomatic cholelithiasis and cholecystectomy recommended.  - Will get RUQ [place] to assess gallbladder  and also to assess for ascites.   - Once he is stabilized, will involve surgery.  5. Anemia: Chronic, with Fe deficiency.  - Will give dose of IV Fe.  6. Developmental delay: Mother at bedside to assist communication.  7. Undefined muscular dystrophy: Needs PT.   Length of Stay: 1  Loralie Champagne, MD  10/01/2017, 3:04 PM  Advanced Heart Failure Team Pager [phone] (M-F; 7a - 4p)  Please contact [facility] for night-coverage after hours (4p -7a ) and weekends on [email or web address]

## 2017-10-01 NOTE — Progress Notes (Addendum)
Received pt to 4E07 from [place]. VSS, pt voices no complaints. Tele applied, verified x2. Set up for dinner. Respiratory notified for CVP monitoring set-up. Brother at bedside and updated on plan of care. Will continue to monitor.  Jaymes Graff, RN

## 2017-10-02 ENCOUNTER — Inpatient Hospital Stay (HOSPITAL_COMMUNITY): Payer: PPO

## 2017-10-02 LAB — GLUCOSE, CAPILLARY
GLUCOSE-CAPILLARY: 99 mg/dL (ref 65–99)
GLUCOSE-CAPILLARY: 129 mg/dL — AB (ref 65–99)
GLUCOSE-CAPILLARY: 159 mg/dL — AB (ref 65–99)
GLUCOSE-CAPILLARY: 122 mg/dL — AB (ref 65–99)

## 2017-10-02 LAB — CBC WITH DIFFERENTIAL/PLATELET
Abs Immature Granulocytes: 0 10*3/uL (ref 0.0–0.1)
BASOS ABS: 0 10*3/uL (ref 0.0–0.1)
Basophils Relative: 0 %
EOS PCT: 2 %
Eosinophils Absolute: 0.2 10*3/uL (ref 0.0–0.7)
HCT: 29.4 % — ABNORMAL LOW (ref 39.0–52.0)
Hemoglobin: 7.6 g/dL — ABNORMAL LOW (ref 13.0–17.0)
Immature Granulocytes: 0 %
Lymphocytes Relative: 9 %
Lymphs Abs: 0.6 10*3/uL — ABNORMAL LOW (ref 0.7–4.0)
MCH: 19.8 pg — AB (ref 26.0–34.0)
MCHC: 25.9 g/dL — AB (ref 30.0–36.0)
MCV: 76.8 fL — ABNORMAL LOW (ref 78.0–100.0)
MONO ABS: 0.3 10*3/uL (ref 0.1–1.0)
Monocytes Relative: 5 %
Neutro Abs: 5.5 10*3/uL (ref 1.7–7.7)
Neutrophils Relative %: 84 %
PLATELETS: 164 10*3/uL (ref 150–400)
RBC: 3.83 MIL/uL — AB (ref 4.22–5.81)
RDW: 20.4 % — ABNORMAL HIGH (ref 11.5–15.5)
WBC: 6.6 10*3/uL (ref 4.0–10.5)

## 2017-10-02 LAB — OCCULT BLOOD X 1 CARD TO LAB, STOOL: Fecal Occult Bld: POSITIVE — AB

## 2017-10-02 LAB — BASIC METABOLIC PANEL
ANION GAP: 6 (ref 5–15)
BUN: 22 mg/dL — AB (ref 6–20)
CHLORIDE: 106 mmol/L (ref 101–111)
CO2: 31 mmol/L (ref 22–32)
Calcium: 6.6 mg/dL — ABNORMAL LOW (ref 8.9–10.3)
Creatinine, Ser: 1.25 mg/dL — ABNORMAL HIGH (ref 0.61–1.24)
GFR calc Af Amer: >60 mL/min (ref >60)
GLUCOSE: 98 mg/dL (ref 65–99)
Potassium: 3.2 mmol/L — ABNORMAL LOW (ref 3.5–5.1)
SODIUM: 143 mmol/L (ref 135–145)

## 2017-10-02 LAB — COOXEMETRY PANEL
CARBOXYHEMOGLOBIN: 1.7 % — AB (ref 0.5–1.5)
METHEMOGLOBIN: 1.2 % (ref 0.0–1.5)
O2 Saturation: 36.5 %
TOTAL HEMOGLOBIN: 8.6 g/dL — AB (ref 12.0–16.0)

## 2017-10-02 MED ORDER — CHLORHEXIDINE GLUCONATE CLOTH 2 % EX PADS: 6.0000 | MEDICATED_PAD | Freq: Once | CUTANEOUS | Status: DC

## 2017-10-02 MED ORDER — CEFAZOLIN SODIUM-DEXTROSE 2-4 GM/100ML-% IV SOLN
2.0000 g | INTRAVENOUS | Status: AC
  Filled 2017-10-02: qty 100

## 2017-10-02 MED ORDER — ATORVASTATIN CALCIUM 40 MG PO TABS
40.0000 mg | ORAL_TABLET | Freq: Every day | ORAL | Status: DC
  Administered 2017-10-02 – 2017-10-13 (×12): 40 mg via ORAL
  Filled 2017-10-02 (×13): qty 1

## 2017-10-02 MED ORDER — POTASSIUM CHLORIDE CRYS ER 20 MEQ PO TBCR
40.0000 meq | EXTENDED_RELEASE_TABLET | Freq: Two times a day (BID) | ORAL | Status: DC
  Administered 2017-10-02 – 2017-10-07 (×11): 40 meq via ORAL
  Filled 2017-10-02 (×11): qty 2

## 2017-10-02 MED ORDER — METOLAZONE 5 MG PO TABS
2.5000 mg | ORAL_TABLET | Freq: Once | ORAL | Status: AC
  Administered 2017-10-02: 2.5 mg via ORAL
  Filled 2017-10-02: qty 1

## 2017-10-02 NOTE — Plan of Care (Signed)
Acute physical therapy goals established for recommended d/c to home with HHPT.

## 2017-10-02 NOTE — Plan of Care (Signed)
Care plans reviewed and patient is progressing.  

## 2017-10-02 NOTE — Evaluation (Signed)
Physical Therapy Evaluation Patient Details Name: Tony Jones MRN: [id number] DOB: [birth year] Today's Date: 10/02/2017   History of Present Illness  Pt is a 57 y.o. male with complex history developmental delay, Muscular dystrophy, chronic hypoxia with witnessed apnea (would not be tolerant of CPAP so uses O2), Barrett's esophagus, anemia, anxiety, cholelithasis, nephrolithiasis, DM, dyslipidemia, GERD, HTN, pulmonary HTN and right heart failure who was admitted directly to the hospital for further evaluation of recent malaise and failure to thrive. He does very little walking, but he has been SOB with minimal exertion.  Clinical Impression  Pt admitted with above complications. Pt currently with functional limitations due to the deficits listed below (see PT Problem List). Able to transfer and ambulate up to 75 feet today at a min guard level with cues for safety. SpO2 dropped to 76% with mild DOE and use of 4L supplemental O2. Improved to lower 90s with seated rest and cues for pursed lip breathing. Good family support however pt has become more dependant on family in the preceding months. I feel he would greatly benefit from HHPT to assist with improving patients strength, function, and independence which will also allow for decreased burden of care from his family. Pt will benefit from skilled PT to increase their independence and safety with mobility to allow discharge to the venue listed below.       Follow Up Recommendations Home health PT;Supervision for mobility/OOB    Equipment Recommendations  None recommended by PT    Recommendations for Other Services       Precautions / Restrictions Precautions Precautions: Other (comment)(watch O2 levels) Restrictions Weight Bearing Restrictions: No      Mobility  Bed Mobility Overal bed mobility: Needs Assistance Bed Mobility: Sit to Supine       Sit to supine: Supervision   General bed mobility comments: Sitting in chair when PT  arrived  Transfers Overall transfer level: Needs assistance Equipment used: None Transfers: Sit to/from Stand Sit to Stand: Min guard         General transfer comment: Cues for hand placement and encouraged to scoot to edge as pt was stating he cannot use his legs. Once feet flat on ground pt able to boost himself with min guard for safety.  Ambulation/Gait Ambulation/Gait assistance: Min guard Ambulation Distance (Feet): 75 Feet Assistive device: Rolling walker (2 wheeled) Gait Pattern/deviations: Step-through pattern;Decreased stride length;Decreased dorsiflexion - right;Decreased dorsiflexion - left;Trunk flexed Gait velocity: decreased Gait velocity interpretation: <1.8 ft/sec, indicate of risk for recurrent falls General Gait Details: Educated on safe DME use with rolling walker, VC for upright posture and less reliance on UEs. Cues for pursed lip breathing. SpO2 dropped to 76% on 4L supplemental O2 but steadily rose after sitting with cues for pursed lip breathing and rest up to 91%. No significant buckling or loss of balance however definite need for RW for support and min guard for safety and equipment mobility.  Stairs            Wheelchair Mobility    Modified Rankin (Stroke Patients Only)       Balance Overall balance assessment: Mild deficits observed, not formally tested                                           Pertinent Vitals/Pain Pain Assessment: Faces Faces Pain Scale: Hurts little more Pain Location: RLE with gait  Pain Descriptors / Indicators: Cramping Pain Intervention(s): Limited activity within patient's tolerance;Monitored during session    Home Living Family/patient expects to be discharged to:: Private residence Living Arrangements: Parent Available Help at Discharge: Family;Available 24 hours/day Type of Home: House Home Access: Level entry(sloped driveway)     Home Layout: One level Home Equipment: Walker - 4  wheels;Other (comment);Grab bars - tub/shower;Hand held shower head(O2)      Prior Function Level of Independence: Needs assistance   Gait / Transfers Assistance Needed: uses rollator for community mobility; household mobility without DME - mother reports that he does not walk much anymore  ADL's / Homemaking Assistance Needed: mother/brother assists with LB dressing PRN but always with bathing and grooming tasks - Pt self-feeds        Hand Dominance   Dominant Hand: Right    Extremity/Trunk Assessment   Upper Extremity Assessment Upper Extremity Assessment: Defer to OT evaluation    Lower Extremity Assessment Lower Extremity Assessment: Generalized weakness       Communication   Communication: HOH  Cognition Arousal/Alertness: Awake/alert Behavior During Therapy: WFL for tasks assessed/performed Overall Cognitive Status: History of cognitive impairments - at baseline                                        General Comments General comments (skin integrity, edema, etc.): DOE 76% sats on 4L, improved with pursed lip breathing and seated rest back to 91%.    Exercises     Assessment/Plan    PT Assessment Patient needs continued PT services  PT Problem List Decreased strength;Decreased range of motion;Decreased activity tolerance;Decreased balance;Decreased mobility;Decreased knowledge of use of DME;Decreased cognition;Cardiopulmonary status limiting activity;Pain       PT Treatment Interventions DME instruction;Gait training;Functional mobility training;Therapeutic activities;Therapeutic exercise;Balance training;Neuromuscular re-education;Patient/family education    PT Goals (Current goals can be found in the Care Plan section)  Acute Rehab PT Goals Patient Stated Goal: get home PT Goal Formulation: With patient/family Time For Goal Achievement: 10/16/17 Potential to Achieve Goals: Good    Frequency Min 3X/week   Barriers to discharge         Co-evaluation               AM-PAC PT "6 Clicks" Daily Activity  Outcome Measure Difficulty turning over in bed (including adjusting bedclothes, sheets and blankets)?: A Little Difficulty moving from lying on back to sitting on the side of the bed? : A Little Difficulty sitting down on and standing up from a chair with arms (e.g., wheelchair, bedside commode, etc,.)?: A Little Help needed moving to and from a bed to chair (including a wheelchair)?: None Help needed walking in hospital room?: None Help needed climbing 3-5 steps with a railing? : A Little 6 Click Score: 20    End of Session Equipment Utilized During Treatment: Gait belt;Oxygen Activity Tolerance: Patient limited by fatigue;Patient tolerated treatment well Patient left: in chair;with call bell/phone within reach;with family/visitor present Nurse Communication: Mobility status PT Visit Diagnosis: Other abnormalities of gait and mobility (R26.89);Muscle weakness (generalized) (M62.81);Pain    Time: 5170-0174 PT Time Calculation (min) (ACUTE ONLY): 25 min   Charges:   PT Evaluation $PT Eval Moderate Complexity: 1 Mod PT Treatments $Gait Training: 8-22 mins   PT G Codes:        Elayne Snare, PT, DPT  Ellouise Newer 10/02/2017, 11:57 AM

## 2017-10-02 NOTE — Progress Notes (Signed)
Patient ID: Tony Jones, male   DOB: 1960-10-18, 57 y.o.   MRN: [id number]     Advanced Heart Failure Rounding Note  PCP-Cardiologist: No primary care provider on file.   Subjective:    Patient feels good this morning, was able to walk in the halls.  Eating lunch with his mother.  History comes from mother.  He is diuresing currently with IV Lasix, CVP is 19.  Co-ox was very low early this am at 36.5% but hemoglobin low and oxygen saturation was low, increased to 4L today.   Hgb down to 7.6, no overt bleeding.  Got IV Fe yesterday.   CT chest high resolution: Limited evaluation for ILD because of presence of pulmonary edema. The RV and PA were dilated.  There was extensive coronary calcification.   Abdominal US: Cholelithiasis without evidence for cholecystitis. Hepatic steatosis.  Small ascites.   Objective:   Weight Range: 204 lb 11.2 oz (92.9 kg) Body mass index is 38.68 kg/m.   Vital Signs:   Temp:  [97.4 F (36.3 C)-98.8 F (37.1 C)] 97.4 F (36.3 C) (06/02 0900) Pulse Rate:  [91-104] 104 (06/02 1207) Resp:  [16-23] 23 (06/02 1207) BP: (101-112)/(73-76) 105/75 (06/02 0900) SpO2:  [89 %-100 %] 90 % (06/02 1207) Weight:  [204 lb 11.2 oz (92.9 kg)] 204 lb 11.2 oz (92.9 kg) (06/01 2015) Last BM Date: 09/29/17  Weight change: Filed Weights   09/30/17 1617 10/01/17 0556 10/01/17 2015  Weight: 204 lb 9.6 oz (92.8 kg) 200 lb 9.6 oz (91 kg) 204 lb 11.2 oz (92.9 kg)    Intake/Output:   Intake/Output Summary (Last 24 hours) at 10/02/2017 1210 Last data filed at 10/02/2017 0930 Gross per 24 hour  Intake 1200 ml  Output 1900 ml  Net -700 ml      Physical Exam    General:  Well appearing. No resp difficulty HEENT: Normal Neck: Thick. JVP 14+ cm.  No lymphadenopathy or thyromegaly appreciated. Cor: PMI nondisplaced. Regular rate & rhythm. No rubs, gallops or murmurs. Lungs: Crackles at bases bilaterally.  Abdomen: Soft, nontender, mildly distended. No hepatosplenomegaly. No  bruits or masses. Good bowel sounds. Extremities: No cyanosis, clubbing, rash. 1+ ankle edema.  Neuro: Alert & orientedx3, cranial nerves grossly intact. moves all 4 extremities w/o difficulty. Affect pleasant   Telemetry   NSR in 100s, personally reviewed.   Labs    CBC Recent Labs    09/30/17 2317 10/02/17 0500  WBC 7.1 6.6  NEUTROABS 5.9 5.5  HGB 8.1* 7.6*  HCT 30.2* 29.4*  MCV 75.1* 76.8*  PLT 186 284   Basic Metabolic Panel Recent Labs    10/01/17 0355 10/02/17 0500  NA 139 143  K 3.1* 3.2*  CL 93* 106  CO2 35* 31  GLUCOSE 86 98  BUN 32* 22*  CREATININE 1.51* 1.25*  CALCIUM 7.6* 6.6*   Liver Function Tests Recent Labs    09/30/17 2317  AST 22  ALT 28  ALKPHOS 141*  BILITOT 1.0  PROT 6.6  ALBUMIN 3.2*   Recent Labs    09/30/17 2317  LIPASE 63*   Cardiac Enzymes Recent Labs    09/30/17 2317  TROPONINI 0.03*    BNP: BNP (last 3 results) Recent Labs    07/30/17 0950 09/30/17 2317  BNP 290.8* 928.6*    ProBNP (last 3 results) No results for input(s): PROBNP in the last 8760 hours.   D-Dimer No results for input(s): DDIMER in the last 72 hours. Hemoglobin A1C  No results for input(s): HGBA1C in the last 72 hours. Fasting Lipid Panel No results for input(s): CHOL, HDL, LDLCALC, TRIG, CHOLHDL, LDLDIRECT in the last 72 hours. Thyroid Function Tests Recent Labs    09/30/17 2317  TSH 1.985    Other results:   Imaging    Ct Chest High Resolution  Result Date: 10/01/2017 CLINICAL DATA:  57 year old male history of interstitial lung disease. EXAM: CT CHEST WITHOUT CONTRAST TECHNIQUE: Multidetector CT imaging of the chest was performed following the standard protocol without intravenous contrast. High resolution imaging of the lungs, as well as inspiratory and expiratory imaging, was performed. COMPARISON:  No priors. FINDINGS: Cardiovascular: Heart size is mildly enlarged with right ventricular and right atrial dilatation. There is no  significant pericardial fluid, thickening or pericardial calcification. There is aortic atherosclerosis, as well as atherosclerosis of the great vessels of the mediastinum and the coronary arteries, including calcified atherosclerotic plaque in the left main, left anterior descending, left circumflex and right coronary arteries. Calcifications of the aortic valve. Dilatation of the pulmonic trunk (4.4 cm in diameter), suggestive of pulmonary arterial hypertension. Right upper extremity PICC with tip terminating in the right atrium. Mediastinum/Nodes: Multiple borderline enlarged and mildly enlarged mediastinal and bilateral hilar lymph nodes, largest of which is in the right paratracheal nodal station measuring 1.6 cm in diameter. Esophagus is unremarkable in appearance. No axillary lymphadenopathy. Lungs/Pleura: Moderate right pleural effusion lying dependently. Trace left pleural effusion lying dependently. Widespread ground-glass attenuation and septal thickening throughout the lungs bilaterally, presumably interstitial pulmonary edema. No definite bronchiectasis or honeycombing. The possibility of concurrent interstitial lung disease is not excluded, but is not strongly favored at this time. Inspiratory and expiratory imaging demonstrates mild air trapping. A few scattered 2-5 mm pulmonary nodules are noted in the lungs bilaterally, nonspecific. No larger more suspicious appearing pulmonary nodules or masses. Upper Abdomen: Trace volume of ascites adjacent to the liver and spleen. Musculoskeletal: No aggressive appearing lytic or blastic lesions are noted in the visualized portions of the skeleton. IMPRESSION: 1. Today's study is limited for assessment of interstitial lung disease by what appears to be congestive heart failure with a background of mild interstitial pulmonary edema. With these limitations in mind, the appearance of the lungs is not favored to reflect interstitial lung disease. If there is  persistent clinical concern for interstitial lung disease, repeat high-resolution chest CT should be considered in the next 3-4 months after resolution of the patient's acute illness to evaluate for non reversible findings. 2. Dilatation of the pulmonic trunk (4.4 cm in diameter), concerning for pulmonary arterial hypertension. 3. Cardiomegaly with right ventricular and right atrial dilatation. 4. There are calcifications of the aortic valve. Echocardiographic correlation for evaluation of potential valvular dysfunction may be warranted if clinically indicated. 5. Aortic atherosclerosis, in addition to left main and 3 vessel coronary artery disease. Please note that although the presence of coronary artery calcium documents the presence of coronary artery disease, the severity of this disease and any potential stenosis cannot be assessed on this non-gated CT examination. Assessment for potential risk factor modification, dietary therapy or pharmacologic therapy may be warranted, if clinically indicated. 6. Moderate right and trace right pleural effusions. 7. Trace volume of ascites adjacent to the liver and spleen. Aortic Atherosclerosis (ICD10-I70.0). Electronically Signed   By: Vinnie Langton M.D.   On: 10/01/2017 22:46   US Abdomen Limited Ruq  Result Date: 10/02/2017 CLINICAL DATA:  Evaluate for cholecystitis and ascites. History of acute kidney injury, diabetes,  hypertension and GERD. EXAM: ULTRASOUND ABDOMEN LIMITED RIGHT UPPER QUADRANT COMPARISON:  CT abdomen pelvis-07/24/2017 FINDINGS: Examination is degraded due to patient body habitus and poor sonographic window. Gallbladder: There is a echogenic shadowing stone within the neck of an otherwise normal-appearing gallbladder. The gallstone measures approximately 2.2 cm in diameter (image 11. No associated gallbladder wall thickening or pericholecystic fluid. Negative sonographic Murphy's sign. Common bile duct: Diameter: Normal in size measuring 3.9 mm  in diameter Liver: There is mild diffuse increased slightly coarsened echogenicity of the hepatic parenchyma. No discrete hepatic lesions. Note is made of a small amount of perihepatic ascites (representative images 16 - 18). A small amount of fluid is also seen within the left lower abdominal quadrant (image 22). Portal vein is patent on color Doppler imaging with normal direction of blood flow towards the liver. IMPRESSION: 1. Cholelithiasis without evidence of cholecystitis. Note, given limitation of the examination due to patient body habitus and poor sonographic window, if there is persistent clinical concern for acute cholecystitis, further evaluation with nuclear medicine HIDA scan could be performed as indicated. 2. Findings suggestive of hepatic steatosis. Correlation with LFTs is advised. 3. Small amount of intra-abdominal ascites. Electronically Signed   By: Sandi Mariscal M.D.   On: 10/02/2017 08:37      Medications:     Scheduled Medications: . allopurinol  300 mg Oral Q supper  . atorvastatin  40 mg Oral q1800  . fesoterodine  8 mg Oral Q supper  . furosemide  80 mg Intravenous BID  . heparin  5,000 Units Subcutaneous Q8H  . insulin aspart  0-9 Units Subcutaneous TID WC  . metolazone  2.5 mg Oral Once  . midodrine  10 mg Oral TID WC  . multivitamin with minerals  1 tablet Oral Q breakfast  . oxybutynin  5 mg Oral BID WC  . pantoprazole  40 mg Oral BID WC  . potassium chloride  40 mEq Oral BID  . sertraline  100 mg Oral Q breakfast  . sodium chloride flush  3 mL Intravenous Q12H     Infusions: . sodium chloride       PRN Medications:  sodium chloride, acetaminophen, docusate sodium, ondansetron, sodium chloride flush, sodium chloride flush   Assessment/Plan   1. RV failure/pulmonary HTN: I suspect the patient has severe RV failure in the setting of severe pulmonary hypertension. Echo showed flattened septum and severe pulmonary hypertension. On CT chest RV and PA were  dilated. 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  Cause of this is uncertain, possible OHS/OSA.  He has not had a sleep study to diagnosis OSA but it is strongly suspected.  V/Q scan in 2016 did not suggest chronic PE. I think group 1 PH is probably unlikely. Hypotension with need for midodrine is concerning for severe RV failure, and co-ox this morning was very low at 36.5% (given his oxygen saturation was low at the time and oxygen was increased to 4L, and his hemoglobin was low). He is volume overloaded on exam with CVP 19 today.  - Lasix 80 mg IV bid and will give dose of metolazone 2.5 x 1. Replace K.  - Continue midodrine 10 mg tid to maintain BP. - Repeat co-ox, would like to avoid inotrope for RV support as long as he is diuresing well and creatinine is stable.   - Once he is more diuresed, I will plan RHC.  2. Chronic hypoxemic respiratory failure: Uncertain etiology, as above suspect OHS/OSA.   -  High resolution chest CT was limited by pulmonary edema but did not appear to show interstitial lung disease.   - Continue oxygen by [place], have had to increase to 4L.  3. CKD: Stage 3.  Hopefully, creatinine will continue to fall with diuresis and lowering of renal venous pressure (1.25 today).  4. Cholelithiasis: Abdomen is not tender today and he has been eating meals.  Has had symptomatic cholelithiasis and cholecystectomy recommended.  RUQ [place] 6/1, cholelithiasis with no evidence for active cholecystitis.    - Once he is stabilized, will involve surgery.  5. Anemia: Chronic, with Fe deficiency. He had a dose of IV Fe yesterday.  Most recently, had UGIB in 3/19 with EGD showing no definite cause for bleeding (other than gastritis).  - Continue bid PPI.  - Hemoccult stool.  - Will transfuse hgb < 7 given marked volume overload and no definite active bleeding.  6. Developmental delay: Mother at bedside to assist communication.  7. Undefined muscular dystrophy: Walked in hall today with  PT.  8. CAD: Strong family history of premature coronary disease.  He has extensive coronary calcification on CT chest this admission.  He has epigastric discomfort that has been attributed to symptomatic cholelithiasis, but given extensive coronary calcification, I am concerned it could actually be angina.  - Start statin.  - Will not start ASA yet until we ensure that hgb will remain stable and he is not actively bleeding.  - Would consider coronary angiography along with RHC if creatinine stays stable.   Length of Stay: 2  Loralie Champagne, MD  10/02/2017, 12:10 PM  Advanced Heart Failure Team Pager [phone] (M-F; 7a - 4p)  Please contact [facility] for night-coverage after hours (4p -7a ) and weekends on [email or web address]

## 2017-10-02 NOTE — Evaluation (Signed)
Occupational Therapy Evaluation Patient Details Name: Tony Jones MRN: [id number] DOB: 1961-01-27 Today's Date: 10/02/2017    History of Present Illness Pt is a 57 y.o. male with complex history developmental delay, Muscular dystrophy, chronic hypoxia with witnessed apnea (would not be tolerant of CPAP so uses O2), Barrett's esophagus, anemia, anxiety, cholelithasis, nephrolithiasis, DM, dyslipidemia, GERD, HTN, pulmonary HTN and right heart failure who was admitted directly to the hospital for further evaluation of recent malaise and failure to thrive. He does very little walking, but he has been SOB with minimal exertion.   Clinical Impression   PTA Pt mod A for ADL at home with assist from mother/brothers. Pt is currently max A for LB ADL, mod A for grooming, and this session limited in observation of mobility (min guard for sit <>stand) due to IV attached to bed and not IV pole) and demonstrates generalized deconditioning, DOE x2 during session, and decreased activity tolerance. OT to follow acutely and feel that [place] would be very beneficial for fall risks assessment as well as caregiver education and for efficiency of ADL in home environment. Pt used to have chores (getting the mail daily, and taking out the trash once a week) that he no longer does, and he used to enjoy fishing. HHOT helpful for caregiver education to encourage meaningful occupation (adaptation) in home environment to ease caregiver burden as well.     Follow Up Recommendations  Home health OT;Supervision/Assistance - 24 hour    Equipment Recommendations  None recommended by OT(Pt has appropriate DME)    Recommendations for Other Services       Precautions / Restrictions Precautions Precautions: Other (comment)(watch O2 levels) Restrictions Weight Bearing Restrictions: No      Mobility Bed Mobility Overal bed mobility: Needs Assistance Bed Mobility: Sit to Supine       Sit to supine: Supervision   General  bed mobility comments: no assist needed, supervision forsafety  Transfers Overall transfer level: Needs assistance Equipment used: None Transfers: Sit to/from Stand Sit to Stand: Min guard         General transfer comment: sit <> stand only - no ambulation due to IV being hooked to bed. min guard for safety but no LOB.    Balance Overall balance assessment: Mild deficits observed, not formally tested                                         ADL either performed or assessed with clinical judgement   ADL Overall ADL's : Needs assistance/impaired Eating/Feeding: Independent;Sitting Eating/Feeding Details (indicate cue type and reason): EOB using utensils without problem Grooming: Oral care;Moderate assistance;Sitting Grooming Details (indicate cue type and reason): EOB Upper Body Bathing: Moderate assistance   Lower Body Bathing: Maximal assistance;With caregiver independent assisting   Upper Body Dressing : Set up   Lower Body Dressing: Maximal assistance;Sit to/from stand Lower Body Dressing Details (indicate cue type and reason): max A to don socks Toilet Transfer: Minimal assistance   Toileting- Clothing Manipulation and Hygiene: Minimal assistance       Functional mobility during ADLs: Min guard(sit <> stand only due to IV lines) General ADL Comments: Pt DOE for transfers, generalized deconditioning     Vision Patient Visual Report: No change from baseline       Perception     Praxis      Pertinent Vitals/Pain Pain Assessment: Faces Faces Pain  Scale: Hurts little more Pain Location: right side when he does deep breathing Pain Descriptors / Indicators: Discomfort;Grimacing;Stabbing Pain Intervention(s): Monitored during session;Repositioned     Hand Dominance Right   Extremity/Trunk Assessment Upper Extremity Assessment Upper Extremity Assessment: Overall WFL for tasks assessed;Generalized weakness   Lower Extremity Assessment Lower  Extremity Assessment: Defer to PT evaluation       Communication Communication Communication: HOH   Cognition Arousal/Alertness: Awake/alert Behavior During Therapy: WFL for tasks assessed/performed Overall Cognitive Status: History of cognitive impairments - at baseline                                     General Comments  mother present throughout session, DOE O2 down to 70 with good wave form, Pt able to increase to mid 80'5 with pursed lip breathing "smell the cake, blow out the candles"    Exercises     Shoulder Instructions      Home Living Family/patient expects to be discharged to:: Private residence Living Arrangements: Parent Available Help at Discharge: Family;Available 24 hours/day Type of Home: House Home Access: Level entry(sloped driveway)     Home Layout: One level     Bathroom Shower/Tub: Occupational psychologist: Standard     Home Equipment: Environmental consultant - 4 wheels;Other (comment);Grab bars - tub/shower;Hand held shower head(O2)          Prior Functioning/Environment Level of Independence: Needs assistance  Gait / Transfers Assistance Needed: uses rollator for community mobility; household mobility without DME - mother reports that he does not walk much anymore ADL's / Homemaking Assistance Needed: mother/brother assists with LB dressing PRN but always with bathing and grooming tasks - Pt self-feeds            OT Problem List: Decreased activity tolerance;Impaired balance (sitting and/or standing);Decreased safety awareness;Cardiopulmonary status limiting activity;Obesity      OT Treatment/Interventions: Self-care/ADL training;Patient/family education;Therapeutic exercise;Energy conservation;DME and/or AE instruction;Therapeutic activities    OT Goals(Current goals can be found in the care plan section) Acute Rehab OT Goals Patient Stated Goal: get home OT Goal Formulation: With patient/family Time For Goal Achievement:  10/16/17 Potential to Achieve Goals: Good ADL Goals Pt Will Perform Upper Body Dressing: with modified independence;sitting Pt Will Perform Lower Body Dressing: with modified independence;sit to/from stand Pt Will Transfer to Toilet: with modified independence;ambulating;regular height toilet Pt Will Perform Toileting - Clothing Manipulation and hygiene: with modified independence;sit to/from stand Additional ADL Goal #1: Pt/mother with recall and implement 3 ways of conserving energy during ADL session.  OT Frequency: Min 2X/week   Barriers to D/C:            Co-evaluation              AM-PAC PT "6 Clicks" Daily Activity     Outcome Measure Help from another person eating meals?: None Help from another person taking care of personal grooming?: A Lot Help from another person toileting, which includes using toliet, bedpan, or urinal?: A Little Help from another person bathing (including washing, rinsing, drying)?: A Lot Help from another person to put on and taking off regular upper body clothing?: None Help from another person to put on and taking off regular lower body clothing?: A Lot 6 Click Score: 17   End of Session Equipment Utilized During Treatment: Oxygen Nurse Communication: Mobility status;Other (comment)(PT coming, please move IV to pole for mobility)  Activity Tolerance:  Patient limited by fatigue Patient left: in bed;with call bell/phone within reach;with bed alarm set;with family/visitor present;with nursing/sitter in room  OT Visit Diagnosis: History of falling (Z91.81);Muscle weakness (generalized) (M62.81);Other (comment)(generalized deconditioning, decreased activity tolerance)                Time: 9532-0233 OT Time Calculation (min): 26 min Charges:  OT General Charges $OT Visit: 1 Visit OT Evaluation $OT Eval Moderate Complexity: 1 Mod OT Treatments $Self Care/Home Management : 8-22 mins G-Codes:     Hulda Humphrey OTR/L [place] 10/02/2017, 10:25 AM

## 2017-10-03 ENCOUNTER — Inpatient Hospital Stay (HOSPITAL_COMMUNITY): Payer: PPO

## 2017-10-03 DIAGNOSIS — I5031 Acute diastolic (congestive) heart failure: Secondary | ICD-10-CM

## 2017-10-03 DIAGNOSIS — J9621 Acute and chronic respiratory failure with hypoxia: Secondary | ICD-10-CM

## 2017-10-03 DIAGNOSIS — G4733 Obstructive sleep apnea (adult) (pediatric): Secondary | ICD-10-CM

## 2017-10-03 LAB — CBC WITH DIFFERENTIAL/PLATELET
Abs Immature Granulocytes: 0.1 10*3/uL (ref 0.0–0.1)
BASOS ABS: 0 10*3/uL (ref 0.0–0.1)
BASOS PCT: 0 %
EOS PCT: 3 %
Eosinophils Absolute: 0.3 10*3/uL (ref 0.0–0.7)
HCT: 29.8 % — ABNORMAL LOW (ref 39.0–52.0)
HEMOGLOBIN: 7.7 g/dL — AB (ref 13.0–17.0)
Immature Granulocytes: 1 %
Lymphocytes Relative: 5 %
Lymphs Abs: 0.5 10*3/uL — ABNORMAL LOW (ref 0.7–4.0)
MCH: 19.6 pg — AB (ref 26.0–34.0)
MCHC: 25.8 g/dL — ABNORMAL LOW (ref 30.0–36.0)
MCV: 76 fL — AB (ref 78.0–100.0)
MONO ABS: 0.3 10*3/uL (ref 0.1–1.0)
MONOS PCT: 4 %
Neutro Abs: 8 10*3/uL — ABNORMAL HIGH (ref 1.7–7.7)
Neutrophils Relative %: 87 %
Platelets: 162 10*3/uL (ref 150–400)
RBC: 3.92 MIL/uL — ABNORMAL LOW (ref 4.22–5.81)
RDW: 20.3 % — ABNORMAL HIGH (ref 11.5–15.5)
WBC: 9.2 10*3/uL (ref 4.0–10.5)

## 2017-10-03 LAB — BASIC METABOLIC PANEL
ANION GAP: 7 (ref 5–15)
BUN: 24 mg/dL — ABNORMAL HIGH (ref 6–20)
CO2: 39 mmol/L — ABNORMAL HIGH (ref 22–32)
Calcium: 8 mg/dL — ABNORMAL LOW (ref 8.9–10.3)
Chloride: 95 mmol/L — ABNORMAL LOW (ref 101–111)
Creatinine, Ser: 1.5 mg/dL — ABNORMAL HIGH (ref 0.61–1.24)
GFR, EST AFRICAN AMERICAN: 58 mL/min — AB (ref >60)
GFR, EST NON AFRICAN AMERICAN: 50 mL/min — AB (ref >60)
Glucose, Bld: 115 mg/dL — ABNORMAL HIGH (ref 65–99)
POTASSIUM: 3.7 mmol/L (ref 3.5–5.1)
Sodium: 141 mmol/L (ref 135–145)

## 2017-10-03 LAB — LIPID PANEL
CHOLESTEROL: 106 mg/dL (ref 0–200)
HDL: 31 mg/dL — ABNORMAL LOW (ref >40)
LDL Cholesterol: 56 mg/dL (ref 0–99)
Total CHOL/HDL Ratio: 3.4 RATIO
Triglycerides: 94 mg/dL (ref <150)
VLDL: 19 mg/dL (ref 0–40)

## 2017-10-03 LAB — COOXEMETRY PANEL
CARBOXYHEMOGLOBIN: 2.1 % — AB (ref 0.5–1.5)
METHEMOGLOBIN: 2 % — AB (ref 0.0–1.5)
O2 Saturation: 57.6 %
TOTAL HEMOGLOBIN: 7.9 g/dL — AB (ref 12.0–16.0)

## 2017-10-03 LAB — GLUCOSE, CAPILLARY
GLUCOSE-CAPILLARY: 110 mg/dL — AB (ref 65–99)
GLUCOSE-CAPILLARY: 139 mg/dL — AB (ref 65–99)
GLUCOSE-CAPILLARY: 165 mg/dL — AB (ref 65–99)
Glucose-Capillary: 133 mg/dL — ABNORMAL HIGH (ref 65–99)

## 2017-10-03 MED ORDER — SODIUM CHLORIDE 0.9 % IV SOLN
INTRAVENOUS | Status: DC
  Administered 2017-10-03: 22:00:00 via INTRAVENOUS

## 2017-10-03 NOTE — Progress Notes (Signed)
RT NOTE: ABG ordered. Entered room and explained procedure. Patient's mother refused ABG for patient. Tony Jones it was too painful and she knows he's in no distress. Sats are 98% on 10lpm HFNC. Notified RN. Will continue to monitor.

## 2017-10-03 NOTE — Progress Notes (Addendum)
Patient ID: Tony Jones, male   DOB: 13-Jan-1961, 57 y.o.   MRN: [id number]     Advanced Heart Failure Rounding Note  PCP-Cardiologist: No primary care provider on file.   Subjective:    Coox 57.6% this am (confounded by low Hgb.) CVP 9-10 at least. Weight down 3 lbs over 2 days. Negative 3.3 L yesterday.   Hgb 7.7 today. No overt bleeding. Got IV Fe 10/01/17.   History obtained from Mother. Pt answers yes to multiple questions while mother disagrees in the background. No lightheadedness currently, but had been prior to admit.  Denies SOB. UOP remains clear/yellow.   High res Chest CT 10/01/17: Limited evaluation for ILD because of presence of pulmonary edema. The RV and PA were dilated.  There was extensive coronary calcification.   Abd [place] 10/02/17: Cholelithiasis without evidence for cholecystitis. Hepatic steatosis.  Small ascites.   Objective:   Weight Range: 201 lb 8 oz (91.4 kg) Body mass index is 38.07 kg/m.   Vital Signs:   Temp:  [97.2 F (36.2 C)-98.6 F (37 C)] 98.6 F (37 C) (06/03 0801) Pulse Rate:  [95-104] 102 (06/03 0801) Resp:  [16-23] 21 (06/03 0801) BP: (92-106)/(70-76) 106/76 (06/03 0801) SpO2:  [90 %-100 %] 98 % (06/03 0801) Weight:  [201 lb 8 oz (91.4 kg)] 201 lb 8 oz (91.4 kg) (06/03 0605) Last BM Date: 10/02/17  Weight change: Filed Weights   10/01/17 0556 10/01/17 2015 10/03/17 0605  Weight: 200 lb 9.6 oz (91 kg) 204 lb 11.2 oz (92.9 kg) 201 lb 8 oz (91.4 kg)    Intake/Output:   Intake/Output Summary (Last 24 hours) at 10/03/2017 0845 Last data filed at 10/03/2017 0800 Gross per 24 hour  Intake 1200 ml  Output 4975 ml  Net -3775 ml      Physical Exam    General: NAD  HEENT: Normal Neck: Supple. JVP 9-10 cm+. Carotids 2+ bilat; no bruits. No thyromegaly or nodule noted. Cor: PMI nondisplaced. RRR, No M/G/R noted Lungs: Diminished basilar sounds.  Abdomen: Soft, non-tender, non-distended, no HSM. No bruits or masses. +BS  Extremities: No  cyanosis, clubbing, or rash. Trace to 1+ ankle edema.  Neuro: Alert & orientedx3, cranial nerves grossly intact. moves all 4 extremities w/o difficulty. Affect pleasant   Telemetry   NSR 90-100s, personally reviewed.   Labs    CBC Recent Labs    10/02/17 0500 10/03/17 0500  WBC 6.6 9.2  NEUTROABS 5.5 8.0*  HGB 7.6* 7.7*  HCT 29.4* 29.8*  MCV 76.8* 76.0*  PLT 164 462   Basic Metabolic Panel Recent Labs    10/02/17 0500 10/03/17 0500  NA 143 141  K 3.2* 3.7  CL 106 95*  CO2 31 39*  GLUCOSE 98 115*  BUN 22* 24*  CREATININE 1.25* 1.50*  CALCIUM 6.6* 8.0*   Liver Function Tests Recent Labs    09/30/17 2317  AST 22  ALT 28  ALKPHOS 141*  BILITOT 1.0  PROT 6.6  ALBUMIN 3.2*   Recent Labs    09/30/17 2317  LIPASE 63*   Cardiac Enzymes Recent Labs    09/30/17 2317  TROPONINI 0.03*    BNP: BNP (last 3 results) Recent Labs    07/30/17 0950 09/30/17 2317  BNP 290.8* 928.6*    ProBNP (last 3 results) No results for input(s): PROBNP in the last 8760 hours.   D-Dimer No results for input(s): DDIMER in the last 72 hours. Hemoglobin A1C No results for input(s): HGBA1C in  the last 72 hours. Fasting Lipid Panel Recent Labs    10/03/17 0500  CHOL 106  HDL 31*  LDLCALC 56  TRIG 94  CHOLHDL 3.4   Thyroid Function Tests Recent Labs    09/30/17 2317  TSH 1.985    Other results:   Imaging    No results found.   Medications:     Scheduled Medications: . allopurinol  300 mg Oral Q supper  . atorvastatin  40 mg Oral q1800  . Chlorhexidine Gluconate Cloth  6 each Topical Once   And  . Chlorhexidine Gluconate Cloth  6 each Topical Once  . fesoterodine  8 mg Oral Q supper  . furosemide  80 mg Intravenous BID  . heparin  5,000 Units Subcutaneous Q8H  . insulin aspart  0-9 Units Subcutaneous TID WC  . midodrine  10 mg Oral TID WC  . multivitamin with minerals  1 tablet Oral Q breakfast  . oxybutynin  5 mg Oral BID WC  . pantoprazole   40 mg Oral BID WC  . potassium chloride  40 mEq Oral BID  . sertraline  100 mg Oral Q breakfast  . sodium chloride flush  3 mL Intravenous Q12H    Infusions: . sodium chloride    .  ceFAZolin (ANCEF) IV Stopped (10/03/17 0528)    PRN Medications: sodium chloride, acetaminophen, docusate sodium, ondansetron, sodium chloride flush, sodium chloride flush   Assessment/Plan   1. RV failure/pulmonary HTN: - Suspect the patient has severe RV failure in the setting of severe pulmonary hypertension. Echo showed flattened septum and severe pulmonary hypertension. On CT chest RV and PA were dilated. 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  Cause of this is uncertain, possible OHS/OSA.  He has not had a sleep study to diagnosis OSA but it is strongly suspected.  V/Q scan in 2016 did not suggest chronic PE. I think group 1 PH is probably unlikely. Hypotension with need for midodrine is concerning for severe RV failure,  - Coox 10/02/17 very low at 36.5% (Though low oxygen and Hgb at the time - Volume status remains at least somewhat elevated. CVP ~9-10 - Continue lasix 80 mg IV BID.  - Continue midodrine 10 mg tid to maintain BP. - Plan RHC once further diuresed. ? Tomorrow.  2. Chronic hypoxemic respiratory failure:  - Uncertain etiology, as above suspect OHS/OSA.   - High resolution chest CT was limited by pulmonary edema but did not appear to show interstitial lung disease.   - Continue oxygen by [place], have had to increase to 4L.  3. CKD: Stage 3 - Continue to follow creatinine closely with diuresis.  4. Cholelithiasis - Abdomen is not tender today and he has been eating meals.  Has had symptomatic cholelithiasis and cholecystectomy recommended. RUQ [place] 6/1, cholelithiasis with no evidence for active cholecystitis.    - Once he is stabilized, will involve surgery.  5. Anemia - Chronic, with Fe deficiency. He had a dose of IV Fe yesterday.  Most recently, had UGIB in 3/19 with EGD  showing no definite cause for bleeding (other than gastritis).  - Occult stool positive. Have asked GI to see (followed by [facility])  - Continue BID PPI.  - Will transfuse if Hgb < 7.0. No definite bleeding.  6. Developmental delay - Mother at bedside to assist communication and consent procedure.  7. Undefined muscular dystrophy - Walked in hall today with PT.  - No change to current plan.  8. CAD - Strong family history of premature coronary disease.  He has extensive coronary calcification on CT chest this admission.  He has epigastric discomfort that has been attributed to symptomatic cholelithiasis, but given extensive coronary calcification, concerned it could actually be angina.  - Continue statin.  - No ASA yet with low Hgb and GI bleed.  - Would consider coronary angiography along with RHC if creatinine stays stable.   Length of Stay: 3  Annamaria Helling  10/03/2017, 8:45 AM  Advanced Heart Failure Team Pager [phone] (M-F; 7a - 4p)  Please contact [facility] for night-coverage after hours (4p -7a ) and weekends on [email or web address]  Patient seen with PA, agree with the above note.  Good diuresis yesterday, creatinine up to 1.5 today but that is near where his baseline has been.  Still profoundly drops oxygen saturation with exertion.    On exam, thick neck with JVP elevated to 10-12 cm.  Mildly tachy, regular rhythm on heart exam.  Distant BS.  1+ edema to knees.   Severe RV failure, continue Lasix 80 mg IV bid today as still volume overloaded.  As above, will plan RHC with possible coronary angiography when more diuresed, probably on Wednesday (EGD tomorrow). Follow creatinine closely, stable so far.   FOBT+, hgb but fairly stable.  GI saw today, plan EGD tomorrow.   Chronic hypoxemic respiratory failure, possibly OHS/OSA.  CT chest poor study but did not show definite ILD.  Given ongoing difficulty with oxygenation, would ask pulmonary to see him (follows with Dr.  Halford Chessman as outpatient).  May be worth trying him on Bipap at night.   Loralie Champagne 10/03/2017 1:39 PM

## 2017-10-03 NOTE — Consult Note (Signed)
PULMONARY / CRITICAL CARE MEDICINE   Name: BOBY EYER MRN: [id number] DOB: 1960-05-26    ADMISSION DATE:  09/30/2017 CONSULTATION DATE:  10/03/2017  REFERRING MD:  Aundra Dubin  CHIEF COMPLAINT:  Hypoxia for possible CPAP   HISTORY OF PRESENT ILLNESS:   Mr Canizalez is a 57 yr old male with some mental delay, severe pulm HTN, right sided heart failure and obesity among other medical problems who was admitted due to worsening hypoxemia requiring high flow nasal canula. Mother was at bedside and most of the history was taken from her. He has no history of smoking and states that he has been using 2 litres oxygen but lately it had to be increased. He is very compliant to his meds. He has no history of ILD and had no pulm HTN 2 yrs ago. He had a VQ scan in the past which showed no VQ mismatch. He had no sleep studies in the past. His brother and father had OSA. He was admitted with CHF in 2016   As per mother he has been having worsening of his lower limb swelling and abdominal girth lately. He denies any fever chills or rigors. As per mother he was born with severe hypotonia and spent most of his childhood in the hospital and eventually gained some tone back.   PAST MEDICAL HISTORY :  He  has a past medical history of Acute kidney injury ([place]), Anemia, Anxiety, Barrett esophagus, Cholelithiasis, Chronic respiratory failure with hypoxia ([place]), Developmental delay, Diabetes mellitus type 2, uncontrolled ([place]), Dyslipidemia, GERD (gastroesophageal reflux disease), Hypertension, Mental disorder, Nephrolithiasis, Pulmonary hypertension ([place]), and Right heart failure ([place]).  PAST SURGICAL HISTORY: He  has a past surgical history that includes Throat surgery; Esophagogastroduodenoscopy (N/A, 08/02/2017); and transthoracic echocardiogram (01/2015).  Allergies  Allergen Reactions  . Codeine Nausea And Vomiting    No current facility-administered medications on file prior to encounter.    Current Outpatient  Medications on File Prior to Encounter  Medication Sig  . acetaminophen (TYLENOL) 650 MG CR tablet Take 1,300 mg by mouth daily as needed for pain.  Marland Kitchen allopurinol (ZYLOPRIM) 300 MG tablet Take 300 mg by mouth daily with supper.   . docusate sodium (COLACE) 100 MG capsule Take 100 mg by mouth daily as needed for mild constipation.   . fesoterodine (TOVIAZ) 8 MG TB24 tablet Take 8 mg by mouth daily with supper.   . furosemide (LASIX) 80 MG tablet Take 120 mg by mouth daily with breakfast.   . glimepiride (AMARYL) 2 MG tablet Take 2 mg by mouth daily with breakfast.  . levocetirizine (XYZAL) 5 MG tablet Take 5 mg by mouth daily with breakfast.   . meclizine (ANTIVERT) 25 MG tablet Take 25 mg by mouth 3 (three) times daily as needed for dizziness.   . metFORMIN (GLUCOPHAGE) 1000 MG tablet Take 1,000 mg by mouth 2 (two) times daily with a meal.  . metoprolol succinate (TOPROL XL) 25 MG 24 hr tablet Take 1 tablet (25 mg total) by mouth daily.  . midodrine (PROAMATINE) 5 MG tablet Take 1 tablet (5 mg total) by mouth 3 (three) times daily with meals.  . Multiple Vitamin (MULTIVITAMIN WITH MINERALS) TABS tablet Take 1 tablet by mouth daily with breakfast.   . OVER THE COUNTER MEDICATION Place 1 drop into both eyes daily as needed (dry eyes). Over the counter lubricating eye drop  . oxybutynin (DITROPAN) 5 MG tablet Take 5 mg by mouth 2 (two) times daily with a meal.   .  OXYGEN Inhale 3 L into the lungs continuous.  . pantoprazole (PROTONIX) 40 MG tablet Take 40 mg by mouth 2 (two) times daily with a meal.   . sertraline (ZOLOFT) 100 MG tablet Take 100 mg by mouth daily with breakfast.  . tiZANidine (ZANAFLEX) 2 MG tablet Take 1 mg by mouth every 6 (six) hours as needed (leg cramps).   . vitamin B-12 (CYANOCOBALAMIN) 500 MCG tablet Take 2 tablets (1,000 mcg total) by mouth daily. (Patient not taking: Reported on 09/30/2017)    FAMILY HISTORY:  His indicated that his mother is alive. He indicated that  his father is deceased. He indicated that both of his brothers are alive. He indicated that his maternal grandmother is deceased. He indicated that his maternal grandfather is deceased. He indicated that his paternal grandmother is deceased. He indicated that his paternal grandfather is deceased.   SOCIAL HISTORY: He  reports that he has never smoked. He has never used smokeless tobacco. He reports that he does not drink alcohol or use drugs.  REVIEW OF SYSTEMS:   All 11 point system review were unremarkable other than what is mentioned in HPI     VITAL SIGNS: BP 93/62 (BP Location: Left Arm)   Pulse (!) 104   Temp 97.7 F (36.5 C) (Oral)   Resp 13   Ht 5\' 1"  (1.549 m)   Wt 91.4 kg (201 lb 8 oz)   SpO2 91%   BMI 38.07 kg/m   HEMODYNAMICS: CVP:  [8 mmHg-18 mmHg] 10 mmHg  VENTILATOR SETTINGS:    INTAKE / OUTPUT: I/O last 3 completed shifts: In: 1560 [P.O.:1560] Out: 5625 [Urine:5625]  PHYSICAL EXAMINATION: General: obese mentally delayed  Neuro: alert oriented X3 power 4/5 allover CN 1-12 intact  HEENT: moist  Cardiovascular:  Normal heart sounds no murmurs  Lungs:  Decreased air sounds bilaterally no wheezing  Abdomen: obese no tenderness no guarding  Musculoskeletal:  +++ lower limb edema  Skin:  No rash   LABS:  BMET Recent Labs  Lab 10/01/17 0355 10/02/17 0500 10/03/17 0500  NA 139 143 141  K 3.1* 3.2* 3.7  CL 93* 106 95*  CO2 35* 31 39*  BUN 32* 22* 24*  CREATININE 1.51* 1.25* 1.50*  GLUCOSE 86 98 115*    Electrolytes Recent Labs  Lab 10/01/17 0355 10/02/17 0500 10/03/17 0500  CALCIUM 7.6* 6.6* 8.0*    CBC Recent Labs  Lab 09/30/17 2317 10/02/17 0500 10/03/17 0500  WBC 7.1 6.6 9.2  HGB 8.1* 7.6* 7.7*  HCT 30.2* 29.4* 29.8*  PLT 186 164 162    Coag's Recent Labs  Lab 09/30/17 2317  INR 1.19    Sepsis Markers No results for input(s): LATICACIDVEN, PROCALCITON, O2SATVEN in the last 168 hours.  ABG No results for input(s):  PHART, PCO2ART, PO2ART in the last 168 hours.  Liver Enzymes Recent Labs  Lab 09/30/17 2317  AST 22  ALT 28  ALKPHOS 141*  BILITOT 1.0  ALBUMIN 3.2*    Cardiac Enzymes Recent Labs  Lab 09/30/17 2317  TROPONINI 0.03*    Glucose Recent Labs  Lab 10/02/17 0603 10/02/17 1145 10/02/17 1634 10/02/17 2148 10/03/17 0604 10/03/17 1139  GLUCAP 99 129* 159* 122* 110* 139*    Imaging No results found.    ASSESSMENT / PLAN:  - Acute on chronic hypoxemic respiratory failure most likely multifactorial including pulmonary hypertension, pulm edema, pleural effusion and possible OSA/OHS - severe pulmonary HTN ?primary vs secondary to untreated OSA/OHS  - right  sided heart failure - suspected obstructive sleep apnea and obesity hypoventilation syndrome - possible restrictive lung disease secondary to obesity  - acute on chronic kidney disease - acute diastolic heart failure  - acute pulm edema   Plan: - I highly suspect OSA/OHS since his body habitus, oral cavity and family history.  - discussed with the mother and she absolutely thinks that there is no way he can do a sleep study. She also states taht she knows that he will not wear a CPAP since he already knows about it seeing it used by his family members. Saying that, she is agreeing to try it while inpatient. - a sleep study will be more ideal if possible. Mother not in favor. If not we will just follow continuous Pox - we will try CPAP at night while inpatient. - saying that, patient remains severely hypoxic on highflow [place]. I do believe that his severe pulm HTN and pulm edema are playing the biggest role and maybe the right pleural effusion to a lower extent.  _ agree with aggressive diuresis and keep in negative balance. Consider diamox if pateint becomes alkalotic (bicarb is climbing but I do believe he has higher baseline)   - ABG - CXR  - I will recommend PFT as an outpatient ?restrictive lung disease from obesity. I  do not see an evidence of ILD on CT chest  - consider thoracentesis if pleural effusion does not improve with diuresis - right heart cath will be beneficial and more informative        Pulmonary and [place] Pager: [phone]  10/03/2017, 2:37 PM

## 2017-10-03 NOTE — H&P (View-Only) (Signed)
Referring Provider: Dr. Aundra Dubin Primary Care Physician:  Christain Sacramento, MD Primary Gastroenterologist:  Dr. Watt Climes  Reason for Consultation:  Anemia  HPI: Tony Jones is a 57 y.o. male with multiple medical problems and history of chronic anemia was admitted for a CHF and pulmonary HTN management. He has a history of Barrett's esophagus and last EGD in April by Dr. Watt Climes showed chronic gastritis without any definite evidence of Barrett's. He has had previous endoscopic ablations for Barrett's at [facility]. Chronic epigastric pain per his mother. Unable to get a history from him due to his developmental delay. No report of melena or hematochezia. Hgb 7.7.  Past Medical History:  Diagnosis Date  . Acute kidney injury ([place])   . Anemia   . Anxiety   . Barrett esophagus   . Cholelithiasis   . Chronic respiratory failure with hypoxia ([place])   . Developmental delay   . Diabetes mellitus type 2, uncontrolled ([place])   . Dyslipidemia   . GERD (gastroesophageal reflux disease)   . Hypertension   . Mental disorder   . Nephrolithiasis   . Pulmonary hypertension ([place])   . Right heart failure [facility])     Past Surgical History:  Procedure Laterality Date  . ESOPHAGOGASTRODUODENOSCOPY N/A 08/02/2017   Procedure: ESOPHAGOGASTRODUODENOSCOPY (EGD);  Surgeon: [name], MD;  Location: [facility];  Service: Endoscopy;  Laterality: N/A;  . THROAT SURGERY    . TRANSTHORACIC ECHOCARDIOGRAM  01/2015   Normal LV size.  Mild LVH.  EF 55-60%.  No RWMA.  Questionable diastolic parameters.  Mild RV and RA dilation.    Prior to Admission medications   Medication Sig Start Date End Date Taking? Authorizing Provider  acetaminophen (TYLENOL) 650 MG CR tablet Take 1,300 mg by mouth daily as needed for pain.   Yes [provider]  allopurinol (ZYLOPRIM) 300 MG tablet Take 300 mg by mouth daily with supper.  01/18/15  Yes [provider]  docusate sodium (COLACE) 100 MG capsule Take 100 mg by mouth daily  as needed for mild constipation.    Yes [provider]  fesoterodine (TOVIAZ) 8 MG TB24 tablet Take 8 mg by mouth daily with supper.    Yes [provider]  furosemide (LASIX) 80 MG tablet Take 120 mg by mouth daily with breakfast.    Yes [provider]  glimepiride (AMARYL) 2 MG tablet Take 2 mg by mouth daily with breakfast.   Yes [provider]  levocetirizine (XYZAL) 5 MG tablet Take 5 mg by mouth daily with breakfast.    Yes [provider]  meclizine (ANTIVERT) 25 MG tablet Take 25 mg by mouth 3 (three) times daily as needed for dizziness.  07/18/17  Yes [provider]  metFORMIN (GLUCOPHAGE) 1000 MG tablet Take 1,000 mg by mouth 2 (two) times daily with a meal. 07/08/17  Yes [provider]  metoprolol succinate (TOPROL XL) 25 MG 24 hr tablet Take 1 tablet (25 mg total) by mouth daily. 08/29/17  Yes Leonie Man, MD  midodrine (PROAMATINE) 5 MG tablet Take 1 tablet (5 mg total) by mouth 3 (three) times daily with meals. 09/13/17  Yes Leonie Man, MD  Multiple Vitamin (MULTIVITAMIN WITH MINERALS) TABS tablet Take 1 tablet by mouth daily with breakfast.    Yes [provider]  OVER THE COUNTER MEDICATION Place 1 drop into both eyes daily as needed (dry eyes). Over the counter lubricating eye drop   Yes [provider]  oxybutynin (DITROPAN) 5 MG tablet Take 5 mg by mouth 2 (two) times daily with a meal.    Yes [provider]  OXYGEN Inhale 3 L into the lungs continuous.   Yes [provider]  pantoprazole (PROTONIX) 40 MG tablet Take 40 mg by mouth 2 (two) times daily with a meal.    Yes [provider]  sertraline (ZOLOFT) 100 MG tablet Take 100 mg by mouth daily with breakfast. 05/22/17  Yes [provider]  tiZANidine (ZANAFLEX) 2 MG tablet Take 1 mg by mouth every 6 (six) hours as needed (leg cramps).  01/16/16  Yes [provider]  vitamin B-12  (CYANOCOBALAMIN) 500 MCG tablet Take 2 tablets (1,000 mcg total) by mouth daily. Patient not taking: Reported on 09/30/2017 02/03/15   Orson Eva, MD    Scheduled Meds: . allopurinol  300 mg Oral Q supper  . atorvastatin  40 mg Oral q1800  . Chlorhexidine Gluconate Cloth  6 each Topical Once   And  . Chlorhexidine Gluconate Cloth  6 each Topical Once  . fesoterodine  8 mg Oral Q supper  . furosemide  80 mg Intravenous BID  . heparin  5,000 Units Subcutaneous Q8H  . insulin aspart  0-9 Units Subcutaneous TID WC  . midodrine  10 mg Oral TID WC  . multivitamin with minerals  1 tablet Oral Q breakfast  . oxybutynin  5 mg Oral BID WC  . pantoprazole  40 mg Oral BID WC  . potassium chloride  40 mEq Oral BID  . sertraline  100 mg Oral Q breakfast  . sodium chloride flush  3 mL Intravenous Q12H   Continuous Infusions: . sodium chloride    .  ceFAZolin (ANCEF) IV Stopped (10/03/17 0528)   PRN Meds:.sodium chloride, acetaminophen, docusate sodium, ondansetron, sodium chloride flush, sodium chloride flush  Allergies as of 09/30/2017 - Review Complete 09/30/2017  Allergen Reaction Noted  . Codeine Nausea And Vomiting 09/09/2015    Family History  Problem Relation Age of Onset  . Congestive Heart Failure Father   . Heart disease Father   . Heart attack Father   . Atrial fibrillation Father   . Brain cancer Father   . Melanoma Father   . Diabetes Mother   . Stroke Mother   . Hypertension Mother   . Fainting Paternal Grandfather   . Congestive Heart Failure Paternal Grandfather   . Atrial fibrillation Paternal Grandfather     Social History   Socioeconomic History  . Marital status: Single    Spouse name: Not on file  . Number of children: Not on file  . Years of education: Not on file  . Highest education level: Not on file  Occupational History  . Occupation: unemployed  Social Needs  . Financial resource strain: Not on file  . Food insecurity:    Worry: Not on file     Inability: Not on file  . Transportation needs:    Medical: Not on file    Non-medical: Not on file  Tobacco Use  . Smoking status: Never Smoker  . Smokeless tobacco: Never Used  Substance and Sexual Activity  . Alcohol use: No  . Drug use: No  . Sexual activity: Never  Lifestyle  . Physical activity:    Days per week: Not on file    Minutes per session: Not on file  . Stress: Not on file  Relationships  . Social connections:    Talks on phone: Not on  file    Gets together: Not on file    Attends religious service: Not on file    Active member of club or organization: Not on file    Attends meetings of clubs or organizations: Not on file    Relationship status: Not on file  . Intimate partner violence:    Fear of current or ex partner: Not on file    Emotionally abused: Not on file    Physically abused: Not on file    Forced sexual activity: Not on file  Other Topics Concern  . Not on file  Social History Narrative  . Not on file    Review of Systems: All negative except as stated above in HPI.  Physical Exam: Vital signs: Vitals:   10/03/17 0801 10/03/17 1141  BP: 106/76 93/62  Pulse: (!) 102 (!) 102  Resp: (!) 21 18  Temp: 98.6 F (37 C) 97.7 F (36.5 C)  SpO2: 98% 90%   Last BM Date: 10/02/17 General:   Alert,  Well-developed, well-nourished, pleasant and cooperative in NAD Head: atraumatic Eyes: anicteric sclera ENT: oropharynx clear, poor dentition Neck: supple, nontender Lungs:  Clear throughout to auscultation.   No wheezes, crackles, or rhonchi. No acute distress. Heart:  Regular rate and rhythm; no murmurs, clicks, rubs,  or gallops. Abdomen: soft, nontender, nondistended, +BS  Rectal:  Deferred   GI:  Lab Results: Recent Labs    09/30/17 2317 10/02/17 0500 10/03/17 0500  WBC 7.1 6.6 9.2  HGB 8.1* 7.6* 7.7*  HCT 30.2* 29.4* 29.8*  PLT 186 164 162   BMET Recent Labs    10/01/17 0355 10/02/17 0500 10/03/17 0500  NA 139 143 141  K  3.1* 3.2* 3.7  CL 93* 106 95*  CO2 35* 31 39*  GLUCOSE 86 98 115*  BUN 32* 22* 24*  CREATININE 1.51* 1.25* 1.50*  CALCIUM 7.6* 6.6* 8.0*   LFT Recent Labs    09/30/17 2317  PROT 6.6  ALBUMIN 3.2*  AST 22  ALT 28  ALKPHOS 141*  BILITOT 1.0   PT/INR Recent Labs    09/30/17 2317  LABPROT 15.1  INR 1.19     Studies/Results: Ct Chest High Resolution  Result Date: 10/01/2017 CLINICAL DATA:  58 year old male history of interstitial lung disease. EXAM: CT CHEST WITHOUT CONTRAST TECHNIQUE: Multidetector CT imaging of the chest was performed following the standard protocol without intravenous contrast. High resolution imaging of the lungs, as well as inspiratory and expiratory imaging, was performed. COMPARISON:  No priors. FINDINGS: Cardiovascular: Heart size is mildly enlarged with right ventricular and right atrial dilatation. There is no significant pericardial fluid, thickening or pericardial calcification. There is aortic atherosclerosis, as well as atherosclerosis of the great vessels of the mediastinum and the coronary arteries, including calcified atherosclerotic plaque in the left main, left anterior descending, left circumflex and right coronary arteries. Calcifications of the aortic valve. Dilatation of the pulmonic trunk (4.4 cm in diameter), suggestive of pulmonary arterial hypertension. Right upper extremity PICC with tip terminating in the right atrium. Mediastinum/Nodes: Multiple borderline enlarged and mildly enlarged mediastinal and bilateral hilar lymph nodes, largest of which is in the right paratracheal nodal station measuring 1.6 cm in diameter. Esophagus is unremarkable in appearance. No axillary lymphadenopathy. Lungs/Pleura: Moderate right pleural effusion lying dependently. Trace left pleural effusion lying dependently. Widespread ground-glass attenuation and septal thickening throughout the lungs bilaterally, presumably interstitial pulmonary edema. No definite  bronchiectasis or honeycombing. The possibility of concurrent interstitial lung disease is  not excluded, but is not strongly favored at this time. Inspiratory and expiratory imaging demonstrates mild air trapping. A few scattered 2-5 mm pulmonary nodules are noted in the lungs bilaterally, nonspecific. No larger more suspicious appearing pulmonary nodules or masses. Upper Abdomen: Trace volume of ascites adjacent to the liver and spleen. Musculoskeletal: No aggressive appearing lytic or blastic lesions are noted in the visualized portions of the skeleton. IMPRESSION: 1. Today's study is limited for assessment of interstitial lung disease by what appears to be congestive heart failure with a background of mild interstitial pulmonary edema. With these limitations in mind, the appearance of the lungs is not favored to reflect interstitial lung disease. If there is persistent clinical concern for interstitial lung disease, repeat high-resolution chest CT should be considered in the next 3-4 months after resolution of the patient's acute illness to evaluate for non reversible findings. 2. Dilatation of the pulmonic trunk (4.4 cm in diameter), concerning for pulmonary arterial hypertension. 3. Cardiomegaly with right ventricular and right atrial dilatation. 4. There are calcifications of the aortic valve. Echocardiographic correlation for evaluation of potential valvular dysfunction may be warranted if clinically indicated. 5. Aortic atherosclerosis, in addition to left main and 3 vessel coronary artery disease. Please note that although the presence of coronary artery calcium documents the presence of coronary artery disease, the severity of this disease and any potential stenosis cannot be assessed on this non-gated CT examination. Assessment for potential risk factor modification, dietary therapy or pharmacologic therapy may be warranted, if clinically indicated. 6. Moderate right and trace right pleural effusions. 7.  Trace volume of ascites adjacent to the liver and spleen. Aortic Atherosclerosis (ICD10-I70.0). Electronically Signed   By: Vinnie Langton M.D.   On: 10/01/2017 22:46   US Abdomen Limited Ruq  Result Date: 10/02/2017 CLINICAL DATA:  Evaluate for cholecystitis and ascites. History of acute kidney injury, diabetes, hypertension and GERD. EXAM: ULTRASOUND ABDOMEN LIMITED RIGHT UPPER QUADRANT COMPARISON:  CT abdomen pelvis-07/24/2017 FINDINGS: Examination is degraded due to patient body habitus and poor sonographic window. Gallbladder: There is a echogenic shadowing stone within the neck of an otherwise normal-appearing gallbladder. The gallstone measures approximately 2.2 cm in diameter (image 11. No associated gallbladder wall thickening or pericholecystic fluid. Negative sonographic Murphy's sign. Common bile duct: Diameter: Normal in size measuring 3.9 mm in diameter Liver: There is mild diffuse increased slightly coarsened echogenicity of the hepatic parenchyma. No discrete hepatic lesions. Note is made of a small amount of perihepatic ascites (representative images 16 - 18). A small amount of fluid is also seen within the left lower abdominal quadrant (image 22). Portal vein is patent on color Doppler imaging with normal direction of blood flow towards the liver. IMPRESSION: 1. Cholelithiasis without evidence of cholecystitis. Note, given limitation of the examination due to patient body habitus and poor sonographic window, if there is persistent clinical concern for acute cholecystitis, further evaluation with nuclear medicine HIDA scan could be performed as indicated. 2. Findings suggestive of hepatic steatosis. Correlation with LFTs is advised. 3. Small amount of intra-abdominal ascites. Electronically Signed   By: Sandi Mariscal M.D.   On: 10/02/2017 08:37    Impression/Plan: Acute on chronic anemia without any overt bleeding. Doubt peptic ulcer bleed but needs an updated EGD to further evaluate.  Epigastric pain probably due to biliary colic. If EGD is unrevealing, then follow H/Hs. Not convinced that he could get adequately prepped for a colonoscopy. Clear liquid diet tonight. NPO p MN for EGD. Continue PO  PPI BID awaiting EGD findings.    LOS: 3 days   [place] C.  10/03/2017, 12:55 PM  Questions please call [phone]

## 2017-10-03 NOTE — Progress Notes (Signed)
PT Cancellation Note  Patient Details Name: Tony Jones MRN: [id number] DOB: [birth year]   Cancelled Treatment:    Reason Eval/Treat Not Completed: Medical issues which prohibited therapy. Pt with respiratory issues.    [place] 10/03/2017, 3:13 PM [place]

## 2017-10-03 NOTE — Progress Notes (Signed)
Patient/family refused CPAP at this time.

## 2017-10-03 NOTE — Consult Note (Signed)
Referring Provider: Dr. Aundra Dubin Primary Care Physician:  Christain Sacramento, MD Primary Gastroenterologist:  Dr. Watt Climes  Reason for Consultation:  Anemia  HPI: Tony Jones is a 57 y.o. male with multiple medical problems and history of chronic anemia was admitted for a CHF and pulmonary HTN management. He has a history of Barrett's esophagus and last EGD in April by Dr. Watt Climes showed chronic gastritis without any definite evidence of Barrett's. He has had previous endoscopic ablations for Barrett's at [facility]. Chronic epigastric pain per his mother. Unable to get a history from him due to his developmental delay. No report of melena or hematochezia. Hgb 7.7.  Past Medical History:  Diagnosis Date  . Acute kidney injury ([place])   . Anemia   . Anxiety   . Barrett esophagus   . Cholelithiasis   . Chronic respiratory failure with hypoxia ([place])   . Developmental delay   . Diabetes mellitus type 2, uncontrolled ([place])   . Dyslipidemia   . GERD (gastroesophageal reflux disease)   . Hypertension   . Mental disorder   . Nephrolithiasis   . Pulmonary hypertension ([place])   . Right heart failure [facility])     Past Surgical History:  Procedure Laterality Date  . ESOPHAGOGASTRODUODENOSCOPY N/A 08/02/2017   Procedure: ESOPHAGOGASTRODUODENOSCOPY (EGD);  Surgeon: [name], MD;  Location: [facility];  Service: Endoscopy;  Laterality: N/A;  . THROAT SURGERY    . TRANSTHORACIC ECHOCARDIOGRAM  01/2015   Normal LV size.  Mild LVH.  EF 55-60%.  No RWMA.  Questionable diastolic parameters.  Mild RV and RA dilation.    Prior to Admission medications   Medication Sig Start Date End Date Taking? Authorizing Provider  acetaminophen (TYLENOL) 650 MG CR tablet Take 1,300 mg by mouth daily as needed for pain.   Yes [provider]  allopurinol (ZYLOPRIM) 300 MG tablet Take 300 mg by mouth daily with supper.  01/18/15  Yes [provider]  docusate sodium (COLACE) 100 MG capsule Take 100 mg by mouth daily  as needed for mild constipation.    Yes [provider]  fesoterodine (TOVIAZ) 8 MG TB24 tablet Take 8 mg by mouth daily with supper.    Yes [provider]  furosemide (LASIX) 80 MG tablet Take 120 mg by mouth daily with breakfast.    Yes [provider]  glimepiride (AMARYL) 2 MG tablet Take 2 mg by mouth daily with breakfast.   Yes [provider]  levocetirizine (XYZAL) 5 MG tablet Take 5 mg by mouth daily with breakfast.    Yes [provider]  meclizine (ANTIVERT) 25 MG tablet Take 25 mg by mouth 3 (three) times daily as needed for dizziness.  07/18/17  Yes [provider]  metFORMIN (GLUCOPHAGE) 1000 MG tablet Take 1,000 mg by mouth 2 (two) times daily with a meal. 07/08/17  Yes [provider]  metoprolol succinate (TOPROL XL) 25 MG 24 hr tablet Take 1 tablet (25 mg total) by mouth daily. 08/29/17  Yes Leonie Man, MD  midodrine (PROAMATINE) 5 MG tablet Take 1 tablet (5 mg total) by mouth 3 (three) times daily with meals. 09/13/17  Yes Leonie Man, MD  Multiple Vitamin (MULTIVITAMIN WITH MINERALS) TABS tablet Take 1 tablet by mouth daily with breakfast.    Yes [provider]  OVER THE COUNTER MEDICATION Place 1 drop into both eyes daily as needed (dry eyes). Over the counter lubricating eye drop   Yes [provider]  oxybutynin (DITROPAN) 5 MG tablet Take 5 mg by mouth 2 (two) times daily with a meal.    Yes [provider]  OXYGEN Inhale 3 L into the lungs continuous.   Yes [provider]  pantoprazole (PROTONIX) 40 MG tablet Take 40 mg by mouth 2 (two) times daily with a meal.    Yes [provider]  sertraline (ZOLOFT) 100 MG tablet Take 100 mg by mouth daily with breakfast. 05/22/17  Yes [provider]  tiZANidine (ZANAFLEX) 2 MG tablet Take 1 mg by mouth every 6 (six) hours as needed (leg cramps).  01/16/16  Yes [provider]  vitamin B-12  (CYANOCOBALAMIN) 500 MCG tablet Take 2 tablets (1,000 mcg total) by mouth daily. Patient not taking: Reported on 09/30/2017 02/03/15   Orson Eva, MD    Scheduled Meds: . allopurinol  300 mg Oral Q supper  . atorvastatin  40 mg Oral q1800  . Chlorhexidine Gluconate Cloth  6 each Topical Once   And  . Chlorhexidine Gluconate Cloth  6 each Topical Once  . fesoterodine  8 mg Oral Q supper  . furosemide  80 mg Intravenous BID  . heparin  5,000 Units Subcutaneous Q8H  . insulin aspart  0-9 Units Subcutaneous TID WC  . midodrine  10 mg Oral TID WC  . multivitamin with minerals  1 tablet Oral Q breakfast  . oxybutynin  5 mg Oral BID WC  . pantoprazole  40 mg Oral BID WC  . potassium chloride  40 mEq Oral BID  . sertraline  100 mg Oral Q breakfast  . sodium chloride flush  3 mL Intravenous Q12H   Continuous Infusions: . sodium chloride    .  ceFAZolin (ANCEF) IV Stopped (10/03/17 0528)   PRN Meds:.sodium chloride, acetaminophen, docusate sodium, ondansetron, sodium chloride flush, sodium chloride flush  Allergies as of 09/30/2017 - Review Complete 09/30/2017  Allergen Reaction Noted  . Codeine Nausea And Vomiting 09/09/2015    Family History  Problem Relation Age of Onset  . Congestive Heart Failure Father   . Heart disease Father   . Heart attack Father   . Atrial fibrillation Father   . Brain cancer Father   . Melanoma Father   . Diabetes Mother   . Stroke Mother   . Hypertension Mother   . Fainting Paternal Grandfather   . Congestive Heart Failure Paternal Grandfather   . Atrial fibrillation Paternal Grandfather     Social History   Socioeconomic History  . Marital status: Single    Spouse name: Not on file  . Number of children: Not on file  . Years of education: Not on file  . Highest education level: Not on file  Occupational History  . Occupation: unemployed  Social Needs  . Financial resource strain: Not on file  . Food insecurity:    Worry: Not on file     Inability: Not on file  . Transportation needs:    Medical: Not on file    Non-medical: Not on file  Tobacco Use  . Smoking status: Never Smoker  . Smokeless tobacco: Never Used  Substance and Sexual Activity  . Alcohol use: No  . Drug use: No  . Sexual activity: Never  Lifestyle  . Physical activity:    Days per week: Not on file    Minutes per session: Not on file  . Stress: Not on file  Relationships  . Social connections:    Talks on phone: Not on  file    Gets together: Not on file    Attends religious service: Not on file    Active member of club or organization: Not on file    Attends meetings of clubs or organizations: Not on file    Relationship status: Not on file  . Intimate partner violence:    Fear of current or ex partner: Not on file    Emotionally abused: Not on file    Physically abused: Not on file    Forced sexual activity: Not on file  Other Topics Concern  . Not on file  Social History Narrative  . Not on file    Review of Systems: All negative except as stated above in HPI.  Physical Exam: Vital signs: Vitals:   10/03/17 0801 10/03/17 1141  BP: 106/76 93/62  Pulse: (!) 102 (!) 102  Resp: (!) 21 18  Temp: 98.6 F (37 C) 97.7 F (36.5 C)  SpO2: 98% 90%   Last BM Date: 10/02/17 General:   Alert,  Well-developed, well-nourished, pleasant and cooperative in NAD Head: atraumatic Eyes: anicteric sclera ENT: oropharynx clear, poor dentition Neck: supple, nontender Lungs:  Clear throughout to auscultation.   No wheezes, crackles, or rhonchi. No acute distress. Heart:  Regular rate and rhythm; no murmurs, clicks, rubs,  or gallops. Abdomen: soft, nontender, nondistended, +BS  Rectal:  Deferred   GI:  Lab Results: Recent Labs    09/30/17 2317 10/02/17 0500 10/03/17 0500  WBC 7.1 6.6 9.2  HGB 8.1* 7.6* 7.7*  HCT 30.2* 29.4* 29.8*  PLT 186 164 162   BMET Recent Labs    10/01/17 0355 10/02/17 0500 10/03/17 0500  NA 139 143 141  K  3.1* 3.2* 3.7  CL 93* 106 95*  CO2 35* 31 39*  GLUCOSE 86 98 115*  BUN 32* 22* 24*  CREATININE 1.51* 1.25* 1.50*  CALCIUM 7.6* 6.6* 8.0*   LFT Recent Labs    09/30/17 2317  PROT 6.6  ALBUMIN 3.2*  AST 22  ALT 28  ALKPHOS 141*  BILITOT 1.0   PT/INR Recent Labs    09/30/17 2317  LABPROT 15.1  INR 1.19     Studies/Results: Ct Chest High Resolution  Result Date: 10/01/2017 CLINICAL DATA:  57 year old male history of interstitial lung disease. EXAM: CT CHEST WITHOUT CONTRAST TECHNIQUE: Multidetector CT imaging of the chest was performed following the standard protocol without intravenous contrast. High resolution imaging of the lungs, as well as inspiratory and expiratory imaging, was performed. COMPARISON:  No priors. FINDINGS: Cardiovascular: Heart size is mildly enlarged with right ventricular and right atrial dilatation. There is no significant pericardial fluid, thickening or pericardial calcification. There is aortic atherosclerosis, as well as atherosclerosis of the great vessels of the mediastinum and the coronary arteries, including calcified atherosclerotic plaque in the left main, left anterior descending, left circumflex and right coronary arteries. Calcifications of the aortic valve. Dilatation of the pulmonic trunk (4.4 cm in diameter), suggestive of pulmonary arterial hypertension. Right upper extremity PICC with tip terminating in the right atrium. Mediastinum/Nodes: Multiple borderline enlarged and mildly enlarged mediastinal and bilateral hilar lymph nodes, largest of which is in the right paratracheal nodal station measuring 1.6 cm in diameter. Esophagus is unremarkable in appearance. No axillary lymphadenopathy. Lungs/Pleura: Moderate right pleural effusion lying dependently. Trace left pleural effusion lying dependently. Widespread ground-glass attenuation and septal thickening throughout the lungs bilaterally, presumably interstitial pulmonary edema. No definite  bronchiectasis or honeycombing. The possibility of concurrent interstitial lung disease is  not excluded, but is not strongly favored at this time. Inspiratory and expiratory imaging demonstrates mild air trapping. A few scattered 2-5 mm pulmonary nodules are noted in the lungs bilaterally, nonspecific. No larger more suspicious appearing pulmonary nodules or masses. Upper Abdomen: Trace volume of ascites adjacent to the liver and spleen. Musculoskeletal: No aggressive appearing lytic or blastic lesions are noted in the visualized portions of the skeleton. IMPRESSION: 1. Today's study is limited for assessment of interstitial lung disease by what appears to be congestive heart failure with a background of mild interstitial pulmonary edema. With these limitations in mind, the appearance of the lungs is not favored to reflect interstitial lung disease. If there is persistent clinical concern for interstitial lung disease, repeat high-resolution chest CT should be considered in the next 3-4 months after resolution of the patient's acute illness to evaluate for non reversible findings. 2. Dilatation of the pulmonic trunk (4.4 cm in diameter), concerning for pulmonary arterial hypertension. 3. Cardiomegaly with right ventricular and right atrial dilatation. 4. There are calcifications of the aortic valve. Echocardiographic correlation for evaluation of potential valvular dysfunction may be warranted if clinically indicated. 5. Aortic atherosclerosis, in addition to left main and 3 vessel coronary artery disease. Please note that although the presence of coronary artery calcium documents the presence of coronary artery disease, the severity of this disease and any potential stenosis cannot be assessed on this non-gated CT examination. Assessment for potential risk factor modification, dietary therapy or pharmacologic therapy may be warranted, if clinically indicated. 6. Moderate right and trace right pleural effusions. 7.  Trace volume of ascites adjacent to the liver and spleen. Aortic Atherosclerosis (ICD10-I70.0). Electronically Signed   By: Vinnie Langton M.D.   On: 10/01/2017 22:46   US Abdomen Limited Ruq  Result Date: 10/02/2017 CLINICAL DATA:  Evaluate for cholecystitis and ascites. History of acute kidney injury, diabetes, hypertension and GERD. EXAM: ULTRASOUND ABDOMEN LIMITED RIGHT UPPER QUADRANT COMPARISON:  CT abdomen pelvis-07/24/2017 FINDINGS: Examination is degraded due to patient body habitus and poor sonographic window. Gallbladder: There is a echogenic shadowing stone within the neck of an otherwise normal-appearing gallbladder. The gallstone measures approximately 2.2 cm in diameter (image 11. No associated gallbladder wall thickening or pericholecystic fluid. Negative sonographic Murphy's sign. Common bile duct: Diameter: Normal in size measuring 3.9 mm in diameter Liver: There is mild diffuse increased slightly coarsened echogenicity of the hepatic parenchyma. No discrete hepatic lesions. Note is made of a small amount of perihepatic ascites (representative images 16 - 18). A small amount of fluid is also seen within the left lower abdominal quadrant (image 22). Portal vein is patent on color Doppler imaging with normal direction of blood flow towards the liver. IMPRESSION: 1. Cholelithiasis without evidence of cholecystitis. Note, given limitation of the examination due to patient body habitus and poor sonographic window, if there is persistent clinical concern for acute cholecystitis, further evaluation with nuclear medicine HIDA scan could be performed as indicated. 2. Findings suggestive of hepatic steatosis. Correlation with LFTs is advised. 3. Small amount of intra-abdominal ascites. Electronically Signed   By: Sandi Mariscal M.D.   On: 10/02/2017 08:37    Impression/Plan: Acute on chronic anemia without any overt bleeding. Doubt peptic ulcer bleed but needs an updated EGD to further evaluate.  Epigastric pain probably due to biliary colic. If EGD is unrevealing, then follow H/Hs. Not convinced that he could get adequately prepped for a colonoscopy. Clear liquid diet tonight. NPO p MN for EGD. Continue PO  PPI BID awaiting EGD findings.    LOS: 3 days   [place] C.  10/03/2017, 12:55 PM  Questions please call [phone]

## 2017-10-03 NOTE — Progress Notes (Signed)
Pt desaturated in the 70's after returning from the bathroom wearing 6L [place]. Pt placed on HFNC at 15L and used pursed lipped breathing to return O2 saturation to the low 90's. MD paged that pt now requiring 15L HFNC. Will continue to monitor.  Clyde Canterbury, RN

## 2017-10-04 ENCOUNTER — Inpatient Hospital Stay (HOSPITAL_COMMUNITY): Payer: PPO | Admitting: Anesthesiology

## 2017-10-04 ENCOUNTER — Encounter (HOSPITAL_COMMUNITY)
Admission: AD | Disposition: A | Discharge status: Home / Self Care | Payer: Self-pay | Source: Ambulatory Visit | Attending: Cardiology

## 2017-10-04 ENCOUNTER — Encounter (HOSPITAL_COMMUNITY): Payer: Self-pay

## 2017-10-04 ENCOUNTER — Other Ambulatory Visit: Payer: Self-pay | Admitting: *Deleted

## 2017-10-04 DIAGNOSIS — J810 Acute pulmonary edema: Secondary | ICD-10-CM

## 2017-10-04 LAB — CBC WITH DIFFERENTIAL/PLATELET
Abs Immature Granulocytes: 0.1 10*3/uL (ref 0.0–0.1)
Basophils Absolute: 0 10*3/uL (ref 0.0–0.1)
Basophils Relative: 0 %
EOS PCT: 2 %
Eosinophils Absolute: 0.2 10*3/uL (ref 0.0–0.7)
HEMATOCRIT: 32.1 % — AB (ref 39.0–52.0)
HEMOGLOBIN: 8.4 g/dL — AB (ref 13.0–17.0)
Immature Granulocytes: 1 %
LYMPHS ABS: 0.5 10*3/uL — AB (ref 0.7–4.0)
LYMPHS PCT: 6 %
MCH: 20.1 pg — AB (ref 26.0–34.0)
MCHC: 26.2 g/dL — AB (ref 30.0–36.0)
MCV: 76.8 fL — ABNORMAL LOW (ref 78.0–100.0)
MONO ABS: 0.3 10*3/uL (ref 0.1–1.0)
Monocytes Relative: 4 %
Neutro Abs: 7.7 10*3/uL (ref 1.7–7.7)
Neutrophils Relative %: 87 %
Platelets: 173 10*3/uL (ref 150–400)
RBC: 4.18 MIL/uL — AB (ref 4.22–5.81)
RDW: 20.6 % — ABNORMAL HIGH (ref 11.5–15.5)
WBC: 8.7 10*3/uL (ref 4.0–10.5)

## 2017-10-04 LAB — BASIC METABOLIC PANEL
Anion gap: 11 (ref 5–15)
BUN: 24 mg/dL — ABNORMAL HIGH (ref 6–20)
CHLORIDE: 91 mmol/L — AB (ref 101–111)
CO2: 43 mmol/L — ABNORMAL HIGH (ref 22–32)
CREATININE: 1.46 mg/dL — AB (ref 0.61–1.24)
Calcium: 9.1 mg/dL (ref 8.9–10.3)
GFR calc non Af Amer: 52 mL/min — ABNORMAL LOW (ref >60)
Glucose, Bld: 112 mg/dL — ABNORMAL HIGH (ref 65–99)
POTASSIUM: 3.9 mmol/L (ref 3.5–5.1)
Sodium: 145 mmol/L (ref 135–145)

## 2017-10-04 LAB — GLUCOSE, CAPILLARY
GLUCOSE-CAPILLARY: 141 mg/dL — AB (ref 65–99)
GLUCOSE-CAPILLARY: 148 mg/dL — AB (ref 65–99)
Glucose-Capillary: 120 mg/dL — ABNORMAL HIGH (ref 65–99)
Glucose-Capillary: 107 mg/dL — ABNORMAL HIGH (ref 65–99)

## 2017-10-04 LAB — COOXEMETRY PANEL
Carboxyhemoglobin: 2.4 % — ABNORMAL HIGH (ref 0.5–1.5)
Methemoglobin: 1 % (ref 0.0–1.5)
O2 Saturation: 49.5 %
Total hemoglobin: 7 g/dL — ABNORMAL LOW (ref 12.0–16.0)

## 2017-10-04 SURGERY — CANCELLED PROCEDURE

## 2017-10-04 MED ORDER — SODIUM CHLORIDE 0.9 % IV SOLN: 250.0000 mL | INTRAVENOUS | Status: DC | PRN

## 2017-10-04 MED ORDER — FUROSEMIDE 10 MG/ML IJ SOLN: 80.0000 mg | Freq: Two times a day (BID) | INTRAMUSCULAR | Status: DC

## 2017-10-04 MED ORDER — SODIUM CHLORIDE 0.9% FLUSH: 3.0000 mL | INTRAVENOUS | Status: DC | PRN

## 2017-10-04 MED ORDER — ASPIRIN 81 MG PO CHEW
81.0000 mg | CHEWABLE_TABLET | ORAL | Status: AC
  Administered 2017-10-05: 81 mg via ORAL
  Filled 2017-10-04: qty 1

## 2017-10-04 MED ORDER — SODIUM CHLORIDE 0.9% FLUSH: 3.0000 mL | Freq: Two times a day (BID) | INTRAVENOUS | Status: DC

## 2017-10-04 MED ORDER — LACTATED RINGERS IV SOLN: INTRAVENOUS | Status: DC

## 2017-10-04 MED ORDER — FUROSEMIDE 10 MG/ML IJ SOLN
80.0000 mg | Freq: Two times a day (BID) | INTRAMUSCULAR | Status: DC
  Administered 2017-10-04 – 2017-10-05 (×2): 80 mg via INTRAVENOUS
  Filled 2017-10-04 (×2): qty 8

## 2017-10-04 MED ORDER — SODIUM CHLORIDE 0.9 % IV SOLN
INTRAVENOUS | Status: DC
  Administered 2017-10-05: 06:00:00 via INTRAVENOUS

## 2017-10-04 SURGICAL SUPPLY — 15 items

## 2017-10-04 NOTE — Patient Outreach (Signed)
[facility]) Care Management  10/04/2017  JAMORIAN DIMARIA Aug 01, 1960 [id number]  Referral via MD office; Reason [place] notification received that patient was inpatient status at [facility] 4E CV-Progressive care unit.  Plan: [facility] Liaison. Telephonic signing off.  Sherrin Daisy, RN BSN [place] Management Coordinator [facility] Care Management  [phone]

## 2017-10-04 NOTE — Care Management Note (Signed)
Case Management Note Marvetta Gibbons RN, BSN Unit 4E-Case Manager [phone]  Patient Details  Name: Tony Jones MRN: [id number] Date of Birth: 14-Jan-1961  Subjective/Objective:  Pt admitted with acute on chronic respiratory failure likely multifactorial including pulmonary hypertension, pulm edema, pleural effusion and possible OSA/OHS, CHF, cholelithiasis- may need surgery involvement when stabilized.                 Action/Plan: PTA pt lived at home with mom, hx of mental delay. Has home 02 at 2L baseline. Per PT/OT evals recommendations for [facility] services- CM to follow for transition of care needs when stable- will need orders for [facility] prior to discharge.   Expected Discharge Date:                  Expected Discharge Plan:  [place]  In-House Referral:  NA  Discharge planning Services  CM Consult  Post Acute Care Choice:  Home Health Choice offered to:  Parent  DME Arranged:    DME Agency:     HH Arranged:    [place] Agency:     Status of Service:  In process, will continue to follow  If discussed at Long Length of Stay Meetings, dates discussed:    Discharge Disposition:   Additional Comments:  Dawayne Patricia, RN 10/04/2017, 11:11 AM

## 2017-10-04 NOTE — Progress Notes (Signed)
Patient ID: Tony Jones, male   DOB: 04/27/1961, 57 y.o.   MRN: [id number]  EGD cancelled today by anesthesia due to oxygen desaturation. He is not overtly bleeding but has acute on chronic anemia. Risks outweigh the benefits of undergoing a repeat EGD at this time. Will NOT attempt to do EGD until his respiratory status is stable. Hgb 8.4. Manage conservatively. Continue PPI PO BID. Will sign off. Call GI back if needed or questions arise.

## 2017-10-04 NOTE — Progress Notes (Addendum)
Patient ID: Tony Jones, male   DOB: Jul 29, 1960, 57 y.o.   MRN: [id number]     Advanced Heart Failure Rounding Note  PCP-Cardiologist: No primary care provider on file.   Subjective:    Coox 49.5% this am. (confounded by low Hgb.) BMET and CBC pending.   CVP 8-9. Weight down 6 more lbs. Negative 2.8 L.   Increase O2 demand and placed on HFNC up to 12 lpm. CPAP at night ordered, but patient refused, with outpatient PFTs recommended.   Sleeping this am but rouses easily. Denies pain or SOB. Mother denies any BRBPR or melena.   High res Chest CT 10/01/17: Limited evaluation for ILD because of presence of pulmonary edema. The RV and PA were dilated.  There was extensive coronary calcification.   Abd [place] 10/02/17: Cholelithiasis without evidence for cholecystitis. Hepatic steatosis.  Small ascites.   Objective:   Weight Range: 195 lb 8 oz (88.7 kg) Body mass index is 36.94 kg/m.   Vital Signs:   Temp:  [96.8 F (36 C)-98.6 F (37 C)] 98.6 F (37 C) (06/03 2006) Pulse Rate:  [100-104] 102 (06/04 0400) Resp:  [13-18] 18 (06/04 0400) BP: (93-118)/(62-82) 105/71 (06/04 0400) SpO2:  [90 %-100 %] 95 % (06/04 0400) Weight:  [195 lb 8 oz (88.7 kg)] 195 lb 8 oz (88.7 kg) (06/04 0500) Last BM Date: 10/02/17  Weight change: Filed Weights   10/01/17 2015 10/03/17 0605 10/04/17 0500  Weight: 204 lb 11.2 oz (92.9 kg) 201 lb 8 oz (91.4 kg) 195 lb 8 oz (88.7 kg)    Intake/Output:   Intake/Output Summary (Last 24 hours) at 10/04/2017 0808 Last data filed at 10/04/2017 0200 Gross per 24 hour  Intake 600 ml  Output 3005 ml  Net -2405 ml      Physical Exam    General: NAD  HEENT: Normal Neck: Supple. JVP 8-9 cm. Carotids 2+ bilat; no bruits. No thyromegaly or nodule noted. Cor: PMI nondisplaced. RRR, No M/G/R noted Lungs: Diminished basilar sounds.  Abdomen: Soft, non-tender, non-distended, no HSM. No bruits or masses. +BS  Extremities: No cyanosis, clubbing, or rash. Trace to 1+ ankle  edema.  Neuro: Alert & orientedx3, cranial nerves grossly intact. moves all 4 extremities w/o difficulty. Affect pleasant   Telemetry   Sinus tach 100-110s, personally reviewed.   Labs    CBC Recent Labs    10/02/17 0500 10/03/17 0500  WBC 6.6 9.2  NEUTROABS 5.5 8.0*  HGB 7.6* 7.7*  HCT 29.4* 29.8*  MCV 76.8* 76.0*  PLT 164 191   Basic Metabolic Panel Recent Labs    10/02/17 0500 10/03/17 0500  NA 143 141  K 3.2* 3.7  CL 106 95*  CO2 31 39*  GLUCOSE 98 115*  BUN 22* 24*  CREATININE 1.25* 1.50*  CALCIUM 6.6* 8.0*   Liver Function Tests No results for input(s): AST, ALT, ALKPHOS, BILITOT, PROT, ALBUMIN in the last 72 hours. No results for input(s): LIPASE, AMYLASE in the last 72 hours. Cardiac Enzymes No results for input(s): CKTOTAL, CKMB, CKMBINDEX, TROPONINI in the last 72 hours.  BNP: BNP (last 3 results) Recent Labs    07/30/17 0950 09/30/17 2317  BNP 290.8* 928.6*    ProBNP (last 3 results) No results for input(s): PROBNP in the last 8760 hours.   D-Dimer No results for input(s): DDIMER in the last 72 hours. Hemoglobin A1C No results for input(s): HGBA1C in the last 72 hours. Fasting Lipid Panel Recent Labs    10/03/17  0500  CHOL 106  HDL 31*  LDLCALC 56  TRIG 94  CHOLHDL 3.4   Thyroid Function Tests No results for input(s): TSH, T4TOTAL, T3FREE, THYROIDAB in the last 72 hours.  Invalid input(s): FREET3  Other results:   Imaging    Dg Chest 1 View  Result Date: 10/03/2017 CLINICAL DATA:  Nausea for several days EXAM: CHEST  1 VIEW COMPARISON:  10/01/2017 FINDINGS: Cardiac shadow remains enlarged. Mild vascular congestion is again seen. Right pleural effusion has improved in the interval from the prior exam. Right-sided PICC line is noted. No bony abnormality is seen. IMPRESSION: Persistent vascular congestion. Electronically Signed   By: Inez Catalina M.D.   On: 10/03/2017 16:47     Medications:     Scheduled Medications: .  allopurinol  300 mg Oral Q supper  . atorvastatin  40 mg Oral q1800  . Chlorhexidine Gluconate Cloth  6 each Topical Once   And  . Chlorhexidine Gluconate Cloth  6 each Topical Once  . fesoterodine  8 mg Oral Q supper  . furosemide  80 mg Intravenous BID  . heparin  5,000 Units Subcutaneous Q8H  . insulin aspart  0-9 Units Subcutaneous TID WC  . midodrine  10 mg Oral TID WC  . multivitamin with minerals  1 tablet Oral Q breakfast  . oxybutynin  5 mg Oral BID WC  . pantoprazole  40 mg Oral BID WC  . potassium chloride  40 mEq Oral BID  . sertraline  100 mg Oral Q breakfast  . sodium chloride flush  3 mL Intravenous Q12H    Infusions: . sodium chloride    . sodium chloride 20 mL/hr at 10/03/17 2133    PRN Medications: sodium chloride, acetaminophen, docusate sodium, ondansetron, sodium chloride flush, sodium chloride flush   Assessment/Plan   1. RV failure/pulmonary HTN: - Suspect the patient has severe RV failure in the setting of severe pulmonary hypertension. Echo showed flattened septum and severe pulmonary hypertension. On CT chest RV and PA were dilated. 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  Cause of this is uncertain, possible OHS/OSA.  He has not had a sleep study to diagnosis OSA but it is strongly suspected.  V/Q scan in 2016 did not suggest chronic PE. Thing group 1 PH is probably unlikely. Hypotension with need for midodrine is concerning for severe RV failure,  - Coox 10/02/17 49.5% this am, but low O2 and likely Hgb (CBC pending). CBC 7.0 on Coox - Volume status remains elevated. CVP 8-9  - Continue lasix 80 mg IV bid today.   - Continue midodrine 10 mg tid to maintain BP. - Plan RHC once further diuresed. Possible Wednesday 10/05/17 with EGD today.  2. Chronic hypoxemic respiratory failure:  - Uncertain etiology, as above suspect OHS/OSA.   - High resolution chest CT was limited by pulmonary edema but did not appear to show interstitial lung disease.   PCCM agrees - CCM has seen and ordered CPAP QHS. Pt unable to tolerate wearing. Recommend outpatient PFTs.  - HFNC with flow up to 6 this am.   3. CKD: Stage 3 - BMET pending.  4. Cholelithiasis - Abdomen is not tender today and he has been eating meals.  Has had symptomatic cholelithiasis and cholecystectomy recommended. RUQ [place] 6/1, cholelithiasis with no evidence for active cholecystitis.    - Once he is stabilized, will involve surgery.  5. Anemia - Chronic, with Fe deficiency. He had a dose of IV Fe.  Most recently, had UGIB in 3/19 with EGD showing no definite cause for bleeding (other than gastritis).  - Occult stool positive. GI plans EGD today.  - Continue BID PPI.  - Will transfuse if Hgb < 7.0. No definite bleeding. CBC pending. Suspect low with tachycardia and low coox.  6. Developmental delay - Mother at bedside to assist communication and consent procedure. - No change to current plan.    7. Undefined muscular dystrophy - Walked in hall today with PT.  - No change to current plan.   8. CAD - Strong family history of premature coronary disease.  He has extensive coronary calcification on CT chest this admission.  He has epigastric discomfort that has been attributed to symptomatic cholelithiasis, but given extensive coronary calcification, concerned it could actually be angina.  - Continue statin.  - No ASA yet with low Hgb and GI bleed.  - Would consider coronary angiography along with RHC if creatinine stays stable.  - No change to current plan.    Length of Stay: [place], [place]  10/04/2017, 8:08 AM  Advanced Heart Failure Team Pager [phone] (M-F; 7a - 4p)  Please contact [facility] for night-coverage after hours (4p -7a ) and weekends on [email or web address]  Patient seen with PA, agree with the above note.  He diuresed well yesterday with IV Lasix.  Still with periodic oxygen desaturation.  Seen by pulmonary, strong suspicion of OHS/OSA.  Hgb higher today  at 8.4.  EGD canceled due to marginal oxygen saturation.   On exam, appears comfortable.  Thick neck.  JVP 9-10 cm.  1+ edema to knees bilaterally.  Regular S1S2.  Clear lungs.   I suspect that the patient has severe RV failure.  I think he is still volume overloaded.   - Will continue Lasix 80 mg IV bid, good response so far.  Replace K.  - Plan RHC tomorrow.  If creatinine stable, will also do coronary angiography due to extensive coronary calcification on CT and epigastric discomfort episodes.   Hgb 8.4, higher today. EGD canceled due to low oxygen saturations. No overt GI bleeding.   Pulmonary has evaluated patient, strongly suspect OHS/OSA.  Body habitus plays a role.  I think he really needs to try CPAP.  We talked about using CPAP tonight with nasal pillows.    Loralie Champagne 10/04/2017 2:44 PM

## 2017-10-04 NOTE — Consult Note (Addendum)
PULMONARY / CRITICAL CARE MEDICINE   Name: Tony Jones MRN: [id number] DOB: 1960-10-01    ADMISSION DATE:  09/30/2017 CONSULTATION DATE:  10/03/2017  REFERRING MD:  Tony Jones  CHIEF COMPLAINT:  Hypoxia for possible CPAP   HISTORY OF PRESENT ILLNESS:   Mr Tony Jones is a 57 yr old male with some mental delay, severe pulm HTN, right sided heart failure and obesity among other medical problems who was admitted due to worsening hypoxemia requiring high flow nasal canula. Mother was at bedside and most of the history was taken from her. He has no history of smoking and states that he has been using 2 litres oxygen but lately it had to be increased. He is very compliant to his meds. He has no history of ILD and had no pulm HTN 2 yrs ago. He had a VQ scan in the past which showed no VQ mismatch. He had no sleep studies in the past. His brother and father had OSA. He was admitted with CHF in 2016   As per mother he has been having worsening of his lower limb swelling and abdominal girth lately. He denies any fever chills or rigors. As per mother he was born with severe hypotonia and spent most of his childhood in the hospital and eventually gained some tone back.     VITAL SIGNS: BP 103/77 (BP Location: Left Arm)   Pulse (!) 103   Temp 98.9 F (37.2 C) (Oral)   Resp 20   Ht 5\' 1"  (1.549 m)   Wt 195 lb 8 oz (88.7 kg)   SpO2 93%   BMI 36.94 kg/m   HEMODYNAMICS: CVP:  [9 mmHg-11 mmHg] 11 mmHg  VENTILATOR SETTINGS:    INTAKE / OUTPUT: I/O last 3 completed shifts: In: 600 [P.O.:600] Out: 6215 [Urine:6215]  PHYSICAL EXAMINATION: General: obese male sitting in chair, in NAD wearing HFNC Neuro: follows commands,  alert oriented X3  , MAE x 4, delayed  HEENT: MM Pink and moist, No LAD  Cardiovascular:  S1, S2, RRR, No RMG Lungs: Diminished throughout, NO wheeze, nasal flaring or sternal retraction Abdomen: obese , NT, ND, BS + Musculoskeletal:  2-3+ BLE edema Skin:  No rash , warm and dry, no  lesions  LABS:  BMET Recent Labs  Lab 10/01/17 0355 10/02/17 0500 10/03/17 0500  NA 139 143 141  K 3.1* 3.2* 3.7  CL 93* 106 95*  CO2 35* 31 39*  BUN 32* 22* 24*  CREATININE 1.51* 1.25* 1.50*  GLUCOSE 86 98 115*    Electrolytes Recent Labs  Lab 10/01/17 0355 10/02/17 0500 10/03/17 0500  CALCIUM 7.6* 6.6* 8.0*    CBC Recent Labs  Lab 09/30/17 2317 10/02/17 0500 10/03/17 0500  WBC 7.1 6.6 9.2  HGB 8.1* 7.6* 7.7*  HCT 30.2* 29.4* 29.8*  PLT 186 164 162    Coag's Recent Labs  Lab 09/30/17 2317  INR 1.19    Sepsis Markers No results for input(s): LATICACIDVEN, PROCALCITON, O2SATVEN in the last 168 hours.  ABG No results for input(s): PHART, PCO2ART, PO2ART in the last 168 hours.  Liver Enzymes Recent Labs  Lab 09/30/17 2317  AST 22  ALT 28  ALKPHOS 141*  BILITOT 1.0  ALBUMIN 3.2*    Cardiac Enzymes Recent Labs  Lab 09/30/17 2317  TROPONINI 0.03*    Glucose Recent Labs  Lab 10/02/17 2148 10/03/17 0604 10/03/17 1139 10/03/17 1643 10/03/17 2204 10/04/17 0601  GLUCAP 122* 110* 139* 165* 133* 120*    Imaging  Dg Chest 1 View  Result Date: 10/03/2017 CLINICAL DATA:  Nausea for several days EXAM: CHEST  1 VIEW COMPARISON:  10/01/2017 FINDINGS: Cardiac shadow remains enlarged. Mild vascular congestion is again seen. Right pleural effusion has improved in the interval from the prior exam. Right-sided PICC line is noted. No bony abnormality is seen. IMPRESSION: Persistent vascular congestion. Electronically Signed   By: Inez Catalina M.D.   On: 10/03/2017 16:47      ASSESSMENT / PLAN:  - Acute on chronic hypoxemic respiratory failure most likely multifactorial including pulmonary hypertension, pulm edema, pleural effusion and possible OSA/OHS - severe pulmonary HTN ?primary vs secondary to untreated OSA/OHS  - right sided heart failure - suspected obstructive sleep apnea and obesity hypoventilation syndrome - possible restrictive lung  disease secondary to obesity  - acute on chronic kidney disease - acute diastolic heart failure  - acute pulm edema   Chronic Hypoxic respiratory failure 2/2 severe PHTN, Pulmonary edema/ pleural effusion A: Requiring HFNC Pulmonary edema Pleural effusion  P: Titrate oxygen to maintain oxygen sats > 93% Follow CXR ABG prn Continue duiresis>>  Net negative 7 L>> Consider diamox if Bicarb increases further Consider Thoracentesis if effusion does not improve with diuresis    RV Failure/ Pulmonary HTN suspected OSA/OHS 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 Restrictive lung disease 2/2 obesity Unable to wear CPAP 6/4 pm A: Echo 08/2017> EF 60-65% Grade 1 DD PA Peak pressure 75 mm Hg flattened septum  Net negative 7 L since admission, 2.8 L overnight   P: CPAP at bedtime >> will need follow up as OP Consider nasal prongs with O2 bleed in  With CPAP as patient may agree to wear Continue diuresis per renal function per cards Continue Midodrine TID per cards Trend Co-ox Monitor CVP RHC after additional diuresis  Consider sleep study as outpatient if possible PFT's as outpatient if possible   Mom is fairly insistent that patient will not wear CPAP as he has watched his dad wear CPAP device.  I explained the relationship of untreated OSA/OHS and pulmonary HTN.  She states if this was "life and death" situation she would consider making him wear it. He has very little reserve and desaturates to the 70's with exertion, with long recovery time with HFNC.   PCCM will continue to  Follow with you  Magdalen Spatz, AGACNP-BC Pulmonary and [place] Pager: [phone]  10/04/2017, 10:18 AM  Attending Note:  57 year old obese male with PMH of cardiac disease as well as OSA.  PCCM was consulted for OSA, hypoxemic respiratory failure and pulmonary HTN.  On exam, decreased BS at both bases.  I reviewed CXR myself, pulmonary edema noted.   Unfortunately, patient refused CPAP overnight.  Discussed with PCCM-NP.  Chronic hypoxemic respiratory failure: multi-factorial with CHF, pulmonary edema, pulmonary HTN and body habitus.   - Titrate O2 for sat of 88-92%  Pulmonary edema:  - Diureses as able  - Cards managing heart failure  Pulmonary HTN:  - Would require a formal work up as I do not believe this is strictly OSA related, OSA classically increases pulmonary artery pressure to 40-50 not more.  - ?rigth heart cath  OSA: refusing CPAP  - Not sure if acquiring CPAP for him will be beneficial as he refuses to wear it  - Will need behavioral modification  - Will attempt to get him nasal prongs  PCCM will see again in AM  Patient seen and examined, agree with above note.  I dictated the care and orders written for this patient under my direction.  Rush Farmer, [place]

## 2017-10-04 NOTE — Progress Notes (Signed)
OT Cancellation    10/04/17 1700  OT Visit Information  Last OT Received On 10/04/17  Reason Eval/Treat Not Completed Fatigue/lethargy limiting ability to participate. Pt with recent rapid response called due to vision deficits and SpO2 dropping. Provided pt's mother with handout on energy conservation and will return tomorrow to provide education for [facility] during ADLs.    [place], OTR/L Acute Rehab Pager: [phone] Office: [phone]

## 2017-10-04 NOTE — Progress Notes (Signed)
Pt's EGD cancelled today by Dr. Ambrose Pancoast, Anesthesia. Pt's Spo2 in the mid 70's on 6L [place] secondary to CHF. Jarrett Soho,  RN aware of cancellation and Dr. Michail Sermon aware and spoke to pt and pt's mother. Jobe Igo, RN

## 2017-10-04 NOTE — H&P (View-Only) (Signed)
Patient ID: Tony Jones, male   DOB: 26-May-1960, 57 y.o.   MRN: [id number]     Advanced Heart Failure Rounding Note  PCP-Cardiologist: No primary care provider on file.   Subjective:    Coox 49.5% this am. (confounded by low Hgb.) BMET and CBC pending.   CVP 8-9. Weight down 6 more lbs. Negative 2.8 L.   Increase O2 demand and placed on HFNC up to 12 lpm. CPAP at night ordered, but patient refused, with outpatient PFTs recommended.   Sleeping this am but rouses easily. Denies pain or SOB. Mother denies any BRBPR or melena.   High res Chest CT 10/01/17: Limited evaluation for ILD because of presence of pulmonary edema. The RV and PA were dilated.  There was extensive coronary calcification.   Abd [place] 10/02/17: Cholelithiasis without evidence for cholecystitis. Hepatic steatosis.  Small ascites.   Objective:   Weight Range: 195 lb 8 oz (88.7 kg) Body mass index is 36.94 kg/m.   Vital Signs:   Temp:  [96.8 F (36 C)-98.6 F (37 C)] 98.6 F (37 C) (06/03 2006) Pulse Rate:  [100-104] 102 (06/04 0400) Resp:  [13-18] 18 (06/04 0400) BP: (93-118)/(62-82) 105/71 (06/04 0400) SpO2:  [90 %-100 %] 95 % (06/04 0400) Weight:  [195 lb 8 oz (88.7 kg)] 195 lb 8 oz (88.7 kg) (06/04 0500) Last BM Date: 10/02/17  Weight change: Filed Weights   10/01/17 2015 10/03/17 0605 10/04/17 0500  Weight: 204 lb 11.2 oz (92.9 kg) 201 lb 8 oz (91.4 kg) 195 lb 8 oz (88.7 kg)    Intake/Output:   Intake/Output Summary (Last 24 hours) at 10/04/2017 0808 Last data filed at 10/04/2017 0200 Gross per 24 hour  Intake 600 ml  Output 3005 ml  Net -2405 ml      Physical Exam    General: NAD  HEENT: Normal Neck: Supple. JVP 8-9 cm. Carotids 2+ bilat; no bruits. No thyromegaly or nodule noted. Cor: PMI nondisplaced. RRR, No M/G/R noted Lungs: Diminished basilar sounds.  Abdomen: Soft, non-tender, non-distended, no HSM. No bruits or masses. +BS  Extremities: No cyanosis, clubbing, or rash. Trace to 1+ ankle  edema.  Neuro: Alert & orientedx3, cranial nerves grossly intact. moves all 4 extremities w/o difficulty. Affect pleasant   Telemetry   Sinus tach 100-110s, personally reviewed.   Labs    CBC Recent Labs    10/02/17 0500 10/03/17 0500  WBC 6.6 9.2  NEUTROABS 5.5 8.0*  HGB 7.6* 7.7*  HCT 29.4* 29.8*  MCV 76.8* 76.0*  PLT 164 737   Basic Metabolic Panel Recent Labs    10/02/17 0500 10/03/17 0500  NA 143 141  K 3.2* 3.7  CL 106 95*  CO2 31 39*  GLUCOSE 98 115*  BUN 22* 24*  CREATININE 1.25* 1.50*  CALCIUM 6.6* 8.0*   Liver Function Tests No results for input(s): AST, ALT, ALKPHOS, BILITOT, PROT, ALBUMIN in the last 72 hours. No results for input(s): LIPASE, AMYLASE in the last 72 hours. Cardiac Enzymes No results for input(s): CKTOTAL, CKMB, CKMBINDEX, TROPONINI in the last 72 hours.  BNP: BNP (last 3 results) Recent Labs    07/30/17 0950 09/30/17 2317  BNP 290.8* 928.6*    ProBNP (last 3 results) No results for input(s): PROBNP in the last 8760 hours.   D-Dimer No results for input(s): DDIMER in the last 72 hours. Hemoglobin A1C No results for input(s): HGBA1C in the last 72 hours. Fasting Lipid Panel Recent Labs    10/03/17  0500  CHOL 106  HDL 31*  LDLCALC 56  TRIG 94  CHOLHDL 3.4   Thyroid Function Tests No results for input(s): TSH, T4TOTAL, T3FREE, THYROIDAB in the last 72 hours.  Invalid input(s): FREET3  Other results:   Imaging    Dg Chest 1 View  Result Date: 10/03/2017 CLINICAL DATA:  Nausea for several days EXAM: CHEST  1 VIEW COMPARISON:  10/01/2017 FINDINGS: Cardiac shadow remains enlarged. Mild vascular congestion is again seen. Right pleural effusion has improved in the interval from the prior exam. Right-sided PICC line is noted. No bony abnormality is seen. IMPRESSION: Persistent vascular congestion. Electronically Signed   By: Inez Catalina M.D.   On: 10/03/2017 16:47     Medications:     Scheduled Medications: .  allopurinol  300 mg Oral Q supper  . atorvastatin  40 mg Oral q1800  . Chlorhexidine Gluconate Cloth  6 each Topical Once   And  . Chlorhexidine Gluconate Cloth  6 each Topical Once  . fesoterodine  8 mg Oral Q supper  . furosemide  80 mg Intravenous BID  . heparin  5,000 Units Subcutaneous Q8H  . insulin aspart  0-9 Units Subcutaneous TID WC  . midodrine  10 mg Oral TID WC  . multivitamin with minerals  1 tablet Oral Q breakfast  . oxybutynin  5 mg Oral BID WC  . pantoprazole  40 mg Oral BID WC  . potassium chloride  40 mEq Oral BID  . sertraline  100 mg Oral Q breakfast  . sodium chloride flush  3 mL Intravenous Q12H    Infusions: . sodium chloride    . sodium chloride 20 mL/hr at 10/03/17 2133    PRN Medications: sodium chloride, acetaminophen, docusate sodium, ondansetron, sodium chloride flush, sodium chloride flush   Assessment/Plan   1. RV failure/pulmonary HTN: - Suspect the patient has severe RV failure in the setting of severe pulmonary hypertension. Echo showed flattened septum and severe pulmonary hypertension. On CT chest RV and PA were dilated. 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  Cause of this is uncertain, possible OHS/OSA.  He has not had a sleep study to diagnosis OSA but it is strongly suspected.  V/Q scan in 2016 did not suggest chronic PE. Thing group 1 PH is probably unlikely. Hypotension with need for midodrine is concerning for severe RV failure,  - Coox 10/02/17 49.5% this am, but low O2 and likely Hgb (CBC pending). CBC 7.0 on Coox - Volume status remains elevated. CVP 8-9  - Continue lasix 80 mg IV bid today.   - Continue midodrine 10 mg tid to maintain BP. - Plan RHC once further diuresed. Possible Wednesday 10/05/17 with EGD today.  2. Chronic hypoxemic respiratory failure:  - Uncertain etiology, as above suspect OHS/OSA.   - High resolution chest CT was limited by pulmonary edema but did not appear to show interstitial lung disease.   PCCM agrees - CCM has seen and ordered CPAP QHS. Pt unable to tolerate wearing. Recommend outpatient PFTs.  - HFNC with flow up to 6 this am.   3. CKD: Stage 3 - BMET pending.  4. Cholelithiasis - Abdomen is not tender today and he has been eating meals.  Has had symptomatic cholelithiasis and cholecystectomy recommended. RUQ [place] 6/1, cholelithiasis with no evidence for active cholecystitis.    - Once he is stabilized, will involve surgery.  5. Anemia - Chronic, with Fe deficiency. He had a dose of IV Fe.  Most recently, had UGIB in 3/19 with EGD showing no definite cause for bleeding (other than gastritis).  - Occult stool positive. GI plans EGD today.  - Continue BID PPI.  - Will transfuse if Hgb < 7.0. No definite bleeding. CBC pending. Suspect low with tachycardia and low coox.  6. Developmental delay - Mother at bedside to assist communication and consent procedure. - No change to current plan.    7. Undefined muscular dystrophy - Walked in hall today with PT.  - No change to current plan.   8. CAD - Strong family history of premature coronary disease.  He has extensive coronary calcification on CT chest this admission.  He has epigastric discomfort that has been attributed to symptomatic cholelithiasis, but given extensive coronary calcification, concerned it could actually be angina.  - Continue statin.  - No ASA yet with low Hgb and GI bleed.  - Would consider coronary angiography along with RHC if creatinine stays stable.  - No change to current plan.    Length of Stay: [place], [place]  10/04/2017, 8:08 AM  Advanced Heart Failure Team Pager [phone] (M-F; 7a - 4p)  Please contact [facility] for night-coverage after hours (4p -7a ) and weekends on [email or web address]  Patient seen with PA, agree with the above note.  He diuresed well yesterday with IV Lasix.  Still with periodic oxygen desaturation.  Seen by pulmonary, strong suspicion of OHS/OSA.  Hgb higher today  at 8.4.  EGD canceled due to marginal oxygen saturation.   On exam, appears comfortable.  Thick neck.  JVP 9-10 cm.  1+ edema to knees bilaterally.  Regular S1S2.  Clear lungs.   I suspect that the patient has severe RV failure.  I think he is still volume overloaded.   - Will continue Lasix 80 mg IV bid, good response so far.  Replace K.  - Plan RHC tomorrow.  If creatinine stable, will also do coronary angiography due to extensive coronary calcification on CT and epigastric discomfort episodes.   Hgb 8.4, higher today. EGD canceled due to low oxygen saturations. No overt GI bleeding.   Pulmonary has evaluated patient, strongly suspect OHS/OSA.  Body habitus plays a role.  I think he really needs to try CPAP.  We talked about using CPAP tonight with nasal pillows.    Loralie Champagne 10/04/2017 2:44 PM

## 2017-10-04 NOTE — Consult Note (Signed)
   [facility] CM Inpatient Consult   10/04/2017  ASAPH SERENA 03/23/1961 [id number]  Patient was recently referred to [facility] for home health needs and oxygen, which is a home health agency need. Patient admitted the same day of referral to [facility] telephonic nurse.   Notified of the patient's admission.  Will follow for progress and needs.  Current review reveals patient is scheduled for a cardiac cath on 10/05/17.  Will  follow for progress and diposition needs.  Of note, [facility] Care Management services does not replace or interfere with any services that are needed or arranged by inpatient case management or social work.  For additional questions or referrals please contact:  Natividad Brood, RN BSN [facility] Liaison  [phone] business mobile phone Toll free office [phone]

## 2017-10-04 NOTE — Anesthesia Preprocedure Evaluation (Signed)
Anesthesia Evaluation  Patient identified by MRN, date of birth, ID band Patient awake    Reviewed: Allergy & Precautions, NPO status   Airway Mallampati: II       Dental   Pulmonary sleep apnea ,  Pulmonary hypertension    breath sounds clear to auscultation       Cardiovascular hypertension, +CHF   Rhythm:Regular Rate:Normal  ECHO 2/19  Study Conclusions  - Left ventricle: The cavity size was normal. There was mild   concentric hypertrophy. Systolic function was normal. The   estimated ejection fraction was in the range of 60% to 65%. Wall   motion was normal; there were no regional wall motion   abnormalities. Doppler parameters are consistent with abnormal   left ventricular relaxation (grade 1 diastolic dysfunction).   Doppler parameters are consistent with high ventricular filling   pressure. - Ventricular septum: The contour showed diastolic flattening and   systolic flattening consistent with right ventricular pressure   and volume overload. - Aortic valve: Transvalvular velocity was within the normal range.   There was no stenosis. There was no regurgitation. - Mitral valve: Calcified annulus. There was no regurgitation. - Right ventricle: The cavity size was normal. Wall thickness was   normal. Systolic function was normal. - Atrial septum: No defect or patent foramen ovale was identified. - Tricuspid valve: There was mild regurgitation. - Pulmonary arteries: Systolic pressure was severely increased. PA   peak pressure: 75 mm Hg (S). - Pericardium, extracardiac: A trivial pericardial effusion was   identified.   Neuro/Psych PSYCHIATRIC DISORDERS Anxiety Developmental delay    GI/Hepatic Neg liver ROS, GERD  ,  Endo/Other  diabetes, Type 2, Oral Hypoglycemic AgentsMorbid obesity  Renal/GU ARFRenal disease     Musculoskeletal   Abdominal   Peds  Hematology  (+) anemia ,   Anesthesia Other  Findings   Reproductive/Obstetrics                             Anesthesia Physical  Anesthesia Plan  ASA: IV and emergent  Anesthesia Plan: MAC   Post-op Pain Management:    Induction: Intravenous  PONV Risk Score and Plan: 1 and Treatment may vary due to age or medical condition  Airway Management Planned: Simple Face Mask, Nasal Cannula, Natural Airway and Mask  Additional Equipment:   Intra-op Plan:   Post-operative Plan:   Informed Consent:   Dental advisory given  Plan Discussed with: CRNA, Anesthesiologist and Surgeon  Anesthesia Plan Comments:         Anesthesia Quick Evaluation

## 2017-10-04 NOTE — Interval H&P Note (Signed)
History and Physical Interval Note:  10/04/2017 11:47 AM  Tony Jones  has presented today for surgery, with the diagnosis of anemia  The various methods of treatment have been discussed with the patient and family. After consideration of risks, benefits and other options for treatment, the patient has consented to  Procedure(s): ESOPHAGOGASTRODUODENOSCOPY (EGD) WITH PROPOFOL (N/A) as a surgical intervention .  The patient's history has been reviewed, patient examined, no change in status, stable for surgery.  I have reviewed the patient's chart and labs.  Questions were answered to the patient's satisfaction.     [place] C.

## 2017-10-04 NOTE — Progress Notes (Signed)
OT Cancellation    10/04/17 1100  OT Visit Information  Last OT Received On 10/04/17  Reason Eval/Treat Not Completed Patient at procedure or test/ unavailable (EEG)   Ethel, OTR/L Acute Rehab Pager: [phone] Office: [phone]

## 2017-10-04 NOTE — Progress Notes (Signed)
RT NOTE:  RT attempted to place CPAP on patient tonight, however, patient did not tolerate. We not stock nasal prongs, so a nasal cup was used. Pt's mother was acceptable to trying and Pt was very willing to try until mask was put on. RT started therapy at [place], but patient began thrashing head and said "NO, I DONT WANT TO DO IT." RT left machine in room @ beside in case patient changes mind.

## 2017-10-04 NOTE — Progress Notes (Signed)
Physical Therapy Treatment Patient Details Name: Tony Jones MRN: [id number] DOB: 1960/12/01 Today's Date: 10/04/2017    History of Present Illness Pt is a 57 y.o. male with complex history developmental delay, Muscular dystrophy, chronic hypoxia with witnessed apnea (would not be tolerant of CPAP so uses O2), Barrett's esophagus, anemia, anxiety, cholelithasis, nephrolithiasis, DM, dyslipidemia, GERD, HTN, pulmonary HTN and right heart failure who was admitted directly to the hospital for further evaluation of recent malaise and failure to thrive. He does very little walking, but he has been SOB with minimal exertion.    PT Comments    Pt making good progress. Continues to have low SpO2 with amb on supplemental O2 but minimal symptoms.    Follow Up Recommendations  Home health PT;Supervision for mobility/OOB     Equipment Recommendations  None recommended by PT    Recommendations for Other Services       Precautions / Restrictions Precautions Precautions: Other (comment) Precaution Comments: has low SpO2 but not syptomatic Restrictions Weight Bearing Restrictions: No    Mobility  Bed Mobility               General bed mobility comments: Pt up on bsc  Transfers Overall transfer level: Needs assistance Equipment used: 4-wheeled walker Transfers: Sit to/from Stand;Stand Pivot Transfers Sit to Stand: Supervision Stand pivot transfers: Supervision       General transfer comment: assist for lines and safety  Ambulation/Gait Ambulation/Gait assistance: Supervision Ambulation Distance (Feet): 200 Feet Assistive device: 4-wheeled walker Gait Pattern/deviations: Step-through pattern;Decreased stride length Gait velocity: decr Gait velocity interpretation: 1.31 - 2.62 ft/sec, indicative of limited community ambulator General Gait Details: Pt amb on 10 hi flow nasal cannula. SpO2 to 79% with no signs of distress in pt. SpO2 returned to 84-86% after returning to room and  sitting down.   Stairs             Wheelchair Mobility    Modified Rankin (Stroke Patients Only)       Balance Overall balance assessment: Mild deficits observed, not formally tested                                          Cognition Arousal/Alertness: Awake/alert Behavior During Therapy: WFL for tasks assessed/performed Overall Cognitive Status: History of cognitive impairments - at baseline                                        Exercises      General Comments        Pertinent Vitals/Pain Pain Assessment: Faces Faces Pain Scale: Hurts a little bit Pain Location: bladder with voiding Pain Descriptors / Indicators: Grimacing Pain Intervention(s): Limited activity within patient's tolerance    Home Living                      Prior Function            PT Goals (current goals can now be found in the care plan section) Progress towards PT goals: Progressing toward goals;Goals met and updated - see care plan    Frequency    Min 3X/week      PT Plan Current plan remains appropriate    Co-evaluation  AM-PAC PT "6 Clicks" Daily Activity  Outcome Measure  Difficulty turning over in bed (including adjusting bedclothes, sheets and blankets)?: A Little Difficulty moving from lying on back to sitting on the side of the bed? : A Little Difficulty sitting down on and standing up from a chair with arms (e.g., wheelchair, bedside commode, etc,.)?: A Little Help needed moving to and from a bed to chair (including a wheelchair)?: A Little Help needed walking in hospital room?: A Little Help needed climbing 3-5 steps with a railing? : A Little 6 Click Score: 18    End of Session Equipment Utilized During Treatment: Gait belt;Oxygen Activity Tolerance: Patient tolerated treatment well Patient left: in chair;with call bell/phone within reach;with family/visitor present Nurse Communication: Mobility  status;Other (comment)(SpO2 levels) PT Visit Diagnosis: Other abnormalities of gait and mobility (R26.89);Muscle weakness (generalized) (M62.81)     Time: 3825-0539 PT Time Calculation (min) (ACUTE ONLY): 19 min  Charges:  $Gait Training: 8-22 mins                    G Codes:       [facility] PT [place] 10/04/2017, 12:57 PM

## 2017-10-05 ENCOUNTER — Inpatient Hospital Stay (HOSPITAL_COMMUNITY): Payer: PPO

## 2017-10-05 ENCOUNTER — Inpatient Hospital Stay (HOSPITAL_COMMUNITY)
Admission: AD | Disposition: A | Discharge status: Home / Self Care | Payer: Self-pay | Source: Ambulatory Visit | Attending: Cardiology

## 2017-10-05 DIAGNOSIS — I2510 Atherosclerotic heart disease of native coronary artery without angina pectoris: Secondary | ICD-10-CM

## 2017-10-05 DIAGNOSIS — I272 Pulmonary hypertension, unspecified: Secondary | ICD-10-CM

## 2017-10-05 DIAGNOSIS — I509 Heart failure, unspecified: Secondary | ICD-10-CM

## 2017-10-05 DIAGNOSIS — I270 Primary pulmonary hypertension: Secondary | ICD-10-CM

## 2017-10-05 HISTORY — PX: RIGHT/LEFT HEART CATH AND CORONARY ANGIOGRAPHY: CATH118266

## 2017-10-05 LAB — GLUCOSE, CAPILLARY
GLUCOSE-CAPILLARY: 131 mg/dL — AB (ref 65–99)
Glucose-Capillary: 115 mg/dL — ABNORMAL HIGH (ref 65–99)
Glucose-Capillary: 122 mg/dL — ABNORMAL HIGH (ref 65–99)
Glucose-Capillary: 151 mg/dL — ABNORMAL HIGH (ref 65–99)

## 2017-10-05 LAB — BASIC METABOLIC PANEL
Anion gap: 11 (ref 5–15)
BUN: 23 mg/dL — ABNORMAL HIGH (ref 6–20)
CALCIUM: 9 mg/dL (ref 8.9–10.3)
CO2: 43 mmol/L — ABNORMAL HIGH (ref 22–32)
CREATININE: 1.46 mg/dL — AB (ref 0.61–1.24)
Chloride: 86 mmol/L — ABNORMAL LOW (ref 101–111)
GFR calc non Af Amer: 52 mL/min — ABNORMAL LOW (ref >60)
GLUCOSE: 139 mg/dL — AB (ref 65–99)
Potassium: 4.2 mmol/L (ref 3.5–5.1)
Sodium: 140 mmol/L (ref 135–145)

## 2017-10-05 LAB — COOXEMETRY PANEL
CARBOXYHEMOGLOBIN: 2.4 % — AB (ref 0.5–1.5)
Methemoglobin: 0.9 % (ref 0.0–1.5)
O2 SAT: 56.8 %
TOTAL HEMOGLOBIN: 8.5 g/dL — AB (ref 12.0–16.0)

## 2017-10-05 LAB — POCT I-STAT 3, VENOUS BLOOD GAS (G3P V)
Acid-Base Excess: 16 mmol/L — ABNORMAL HIGH (ref 0.0–2.0)
Acid-Base Excess: 16 mmol/L — ABNORMAL HIGH (ref 0.0–2.0)
BICARBONATE: 44.1 mmol/L — AB (ref 20.0–28.0)
BICARBONATE: 44.3 mmol/L — AB (ref 20.0–28.0)
O2 SAT: 55 %
O2 Saturation: 56 %
PCO2 VEN: 79.2 mmHg — AB (ref 44.0–60.0)
PCO2 VEN: 79.3 mmHg — AB (ref 44.0–60.0)
PO2 VEN: 32 mmHg (ref 32.0–45.0)
TCO2: 46 mmol/L — AB (ref 22–32)
TCO2: 47 mmol/L — AB (ref 22–32)
pH, Ven: 7.353 (ref 7.250–7.430)
pH, Ven: 7.355 (ref 7.250–7.430)
pO2, Ven: 33 mmHg (ref 32.0–45.0)

## 2017-10-05 LAB — CBC WITH DIFFERENTIAL/PLATELET
BAND NEUTROPHILS: 2 %
BASOS PCT: 0 %
Basophils Absolute: 0 10*3/uL (ref 0.0–0.1)
Blasts: 0 %
EOS ABS: 0.1 10*3/uL (ref 0.0–0.7)
EOS PCT: 1 %
HCT: 33.4 % — ABNORMAL LOW (ref 39.0–52.0)
Hemoglobin: 8.6 g/dL — ABNORMAL LOW (ref 13.0–17.0)
LYMPHS ABS: 0.4 10*3/uL — AB (ref 0.7–4.0)
Lymphocytes Relative: 4 %
MCH: 20.3 pg — ABNORMAL LOW (ref 26.0–34.0)
MCHC: 25.7 g/dL — AB (ref 30.0–36.0)
MCV: 78.8 fL (ref 78.0–100.0)
METAMYELOCYTES PCT: 1 %
MONO ABS: 0.2 10*3/uL (ref 0.1–1.0)
MYELOCYTES: 0 %
Monocytes Relative: 2 %
Neutro Abs: 9.7 10*3/uL — ABNORMAL HIGH (ref 1.7–7.7)
Neutrophils Relative %: 90 %
Other: 0 %
PLATELETS: 164 10*3/uL (ref 150–400)
Promyelocytes Relative: 0 %
RBC: 4.24 MIL/uL (ref 4.22–5.81)
RDW: 21.8 % — AB (ref 11.5–15.5)
Smear Review: ADEQUATE
WBC: 10.4 10*3/uL (ref 4.0–10.5)
nRBC: 0 /100 WBC

## 2017-10-05 SURGERY — RIGHT/LEFT HEART CATH AND CORONARY ANGIOGRAPHY
Anesthesia: LOCAL

## 2017-10-05 MED ORDER — HEPARIN (PORCINE) IN NACL 2-0.9 UNITS/ML
INTRAMUSCULAR | Status: AC | PRN
  Administered 2017-10-05 (×2): 500 mL

## 2017-10-05 MED ORDER — HEPARIN SODIUM (PORCINE) 1000 UNIT/ML IJ SOLN
INTRAMUSCULAR | Status: AC
  Filled 2017-10-05: qty 1

## 2017-10-05 MED ORDER — LIDOCAINE HCL (PF) 1 % IJ SOLN
INTRAMUSCULAR | Status: AC
  Filled 2017-10-05: qty 30

## 2017-10-05 MED ORDER — FUROSEMIDE 10 MG/ML IJ SOLN
80.0000 mg | Freq: Two times a day (BID) | INTRAMUSCULAR | Status: DC
  Administered 2017-10-05 – 2017-10-07 (×4): 80 mg via INTRAVENOUS
  Filled 2017-10-05 (×4): qty 8

## 2017-10-05 MED ORDER — ONDANSETRON HCL 4 MG/2ML IJ SOLN: 4.0000 mg | Freq: Four times a day (QID) | INTRAMUSCULAR | Status: DC | PRN

## 2017-10-05 MED ORDER — VERAPAMIL HCL 2.5 MG/ML IV SOLN
INTRAVENOUS | Status: AC
  Filled 2017-10-05: qty 2

## 2017-10-05 MED ORDER — SODIUM CHLORIDE 0.9 % IV SOLN: 250.0000 mL | INTRAVENOUS | Status: DC | PRN

## 2017-10-05 MED ORDER — IOHEXOL 350 MG/ML SOLN
INTRAVENOUS | Status: DC | PRN
  Administered 2017-10-05: 100 mL via INTRAVENOUS

## 2017-10-05 MED ORDER — HEPARIN SODIUM (PORCINE) 1000 UNIT/ML IJ SOLN
INTRAMUSCULAR | Status: DC | PRN
  Administered 2017-10-05: 4000 [IU] via INTRAVENOUS

## 2017-10-05 MED ORDER — ACETAMINOPHEN 325 MG PO TABS
650.0000 mg | ORAL_TABLET | ORAL | Status: DC | PRN
  Administered 2017-10-05 – 2017-10-13 (×5): 650 mg via ORAL
  Filled 2017-10-05 (×5): qty 2

## 2017-10-05 MED ORDER — LIDOCAINE HCL (PF) 1 % IJ SOLN
INTRAMUSCULAR | Status: DC | PRN
  Administered 2017-10-05: 2 mL
  Administered 2017-10-05: 1 mL

## 2017-10-05 MED ORDER — SODIUM CHLORIDE 0.9% FLUSH
3.0000 mL | Freq: Two times a day (BID) | INTRAVENOUS | Status: DC
  Administered 2017-10-06 – 2017-10-13 (×7): 3 mL via INTRAVENOUS

## 2017-10-05 MED ORDER — HEPARIN (PORCINE) IN NACL 1000-0.9 UT/500ML-% IV SOLN
INTRAVENOUS | Status: AC
  Filled 2017-10-05: qty 1000

## 2017-10-05 MED ORDER — SODIUM CHLORIDE 0.9% FLUSH: 3.0000 mL | INTRAVENOUS | Status: DC | PRN

## 2017-10-05 MED ORDER — HEPARIN SODIUM (PORCINE) 5000 UNIT/ML IJ SOLN: 5000.0000 [IU] | Freq: Three times a day (TID) | INTRAMUSCULAR | Status: DC

## 2017-10-05 MED ORDER — VERAPAMIL HCL 2.5 MG/ML IV SOLN
INTRAVENOUS | Status: DC | PRN
  Administered 2017-10-05: 10 mL via INTRA_ARTERIAL

## 2017-10-05 SURGICAL SUPPLY — 18 items
CATH BALLN WEDGE 5F 110CM (CATHETERS) ×1 IMPLANT
CATH IMPULSE 5F ANG/FL3.5 (CATHETERS) ×1 IMPLANT
CATH INFINITI 5 FR 3DRC (CATHETERS) ×1 IMPLANT
CATH LAUNCHER 5F JL3 (CATHETERS) IMPLANT
CATHETER LAUNCHER 5F JL3 (CATHETERS) ×2
DEVICE RAD COMP TR BAND LRG (VASCULAR PRODUCTS) ×1 IMPLANT
GUIDEWIRE .025 260CM (WIRE) ×1 IMPLANT
GUIDEWIRE INQWIRE 1.5J.035X260 (WIRE) IMPLANT
INQWIRE 1.5J .035X260CM (WIRE) ×2
KIT HEART LEFT (KITS) ×2 IMPLANT
NDL PERC 21GX4CM (NEEDLE) IMPLANT
NEEDLE PERC 21GX4CM (NEEDLE) ×2 IMPLANT
PACK CARDIAC CATHETERIZATION (CUSTOM PROCEDURE TRAY) ×2 IMPLANT
SHEATH PINNACLE 7F 10CM (SHEATH) IMPLANT
SHEATH RAIN 4/5FR (SHEATH) ×1 IMPLANT
SHEATH RAIN RADIAL 21G 6FR (SHEATH) ×1 IMPLANT
TRANSDUCER W/STOPCOCK (MISCELLANEOUS) ×2 IMPLANT
TUBING CIL FLEX 10 FLL-RA (TUBING) ×2 IMPLANT

## 2017-10-05 NOTE — Progress Notes (Signed)
Pt received from cath lab, VSS. Telemetry applied. TR band on RR with 13cc of air, extremity with good pleth and cap refill. Call light in reach. Will continue to monitor.  Clyde Canterbury, RN

## 2017-10-05 NOTE — Progress Notes (Signed)
Occupational Therapy Treatment Patient Details Name: Tony Jones MRN: [id number] DOB: 1960-05-05 Today's Date: 10/05/2017    History of present illness Pt is a 57 y.o. male with complex history developmental delay, Muscular dystrophy, chronic hypoxia with witnessed apnea (would not be tolerant of CPAP so uses O2), Barrett's esophagus, anemia, anxiety, cholelithasis, nephrolithiasis, DM, dyslipidemia, GERD, HTN, pulmonary HTN and right heart failure who was admitted directly to the hospital for further evaluation of recent malaise and failure to thrive. He does very little walking, but he has been SOB with minimal exertion.   OT comments  Pt progressing towards established OT goals. Providing mother with handout and education on EC to increase independence with ADLs at home; mother verbalizing understanding. Pt performing toileting (using the urinal) with Max A from mother. Required Mod cues for oral care at sink with Min Guard A for safety in standing. Performing home level functional mobility in hall with Min A for balance. Pt SpO2 fluctuating between 67-98% on 7L high flow O2. No signs of SOB or distress. Continue to recommend dc home with HHOT and will continue to follow acutely as admitted.    Follow Up Recommendations  Home health OT;Supervision/Assistance - 24 hour    Equipment Recommendations  None recommended by OT    Recommendations for Other Services      Precautions / Restrictions Precautions Precautions: Other (comment) Precaution Comments: has low SpO2 but not syptomatic Restrictions Weight Bearing Restrictions: No       Mobility Bed Mobility Overal bed mobility: Needs Assistance Bed Mobility: Supine to Sit;Sit to Supine     Supine to sit: Supervision Sit to supine: Supervision   General bed mobility comments: supervision for safety  Transfers Overall transfer level: Needs assistance Equipment used: 1 person hand held assist Transfers: Sit to/from Stand Sit to  Stand: Min guard         General transfer comment: Min guard for safety +2 for line management. Performed multiple times during session    Balance Overall balance assessment: Mild deficits observed, not formally tested                                         ADL either performed or assessed with clinical judgement   ADL Overall ADL's : Needs assistance/impaired     Grooming: Oral care;Min guard;Standing;Cueing for sequencing;Moderate assistance Grooming Details (indicate cue type and reason): Mod VCs for sequencing to perform oral care at sink. Min Guard A for safety in standing.              Lower Body Dressing: Maximal assistance;Sit to/from stand Lower Body Dressing Details (indicate cue type and reason): Max A to don shoes. Pt able to slip shoes off at EOB.     Toileting- Clothing Manipulation and Hygiene: Maximal assistance;Sit to/from stand;With caregiver independent assisting Toileting - Clothing Manipulation Details (indicate cue type and reason): Mother assisted in using urinal while standing near EOB.      Functional mobility during ADLs: Minimal assistance(Slight instability) General ADL Comments: Pt performing ADLs and funcitonal mobility at a [employer]- Min A  for balance. Mod cues for sequencing and performance of ADLs     Vision       Perception     Praxis      Cognition Arousal/Alertness: Awake/alert Behavior During Therapy: WFL for tasks assessed/performed Overall Cognitive Status: History of cognitive impairments - at  baseline                                 General Comments: Baseline cognition at 57 years old. Requiring Mod cues for squencing. Requiring calm and simple environment to increase occupational performance and particiaption.         Exercises     Shoulder Instructions       General Comments Pt on 7L O2 high flow n/c. SpO2 fluctuating between 98%-67% during funcitonal mobility and ADLs. Pt  talking throughout and not showing signs of SOB or distress. Providing cues for purse lip breathing.    Pertinent Vitals/ Pain       Pain Assessment: Faces Faces Pain Scale: Hurts a little bit Pain Location: generalized and verbally reported when attempting to urinate Pain Descriptors / Indicators: Grimacing Pain Intervention(s): Monitored during session;Repositioned  Home Living                                          Prior Functioning/Environment              Frequency  Min 2X/week        Progress Toward Goals  OT Goals(current goals can now be found in the care plan section)  Progress towards OT goals: Progressing toward goals  Acute Rehab OT Goals Patient Stated Goal: to get his hair cut OT Goal Formulation: With patient/family Time For Goal Achievement: 10/16/17 Potential to Achieve Goals: Good ADL Goals Pt Will Perform Upper Body Dressing: with modified independence;sitting Pt Will Perform Lower Body Dressing: with modified independence;sit to/from stand Pt Will Transfer to Toilet: with modified independence;ambulating;regular height toilet Pt Will Perform Toileting - Clothing Manipulation and hygiene: with modified independence;sit to/from stand Additional ADL Goal #1: Pt/mother with recall and implement 3 ways of conserving energy during ADL session.  Plan Discharge plan remains appropriate    Co-evaluation                 AM-PAC PT "6 Clicks" Daily Activity     Outcome Measure   Help from another person eating meals?: None Help from another person taking care of personal grooming?: A Lot Help from another person toileting, which includes using toliet, bedpan, or urinal?: A Lot Help from another person bathing (including washing, rinsing, drying)?: A Lot Help from another person to put on and taking off regular upper body clothing?: None Help from another person to put on and taking off regular lower body clothing?: A Lot 6  Click Score: 16    End of Session Equipment Utilized During Treatment: Oxygen  OT Visit Diagnosis: History of falling (Z91.81);Muscle weakness (generalized) (M62.81);Other (comment)   Activity Tolerance Patient tolerated treatment well   Patient Left in bed;with call bell/phone within reach;with family/visitor present   Nurse Communication Mobility status        Time: 4010-2725 OT Time Calculation (min): 33 min  Charges: OT General Charges $OT Visit: 1 Visit OT Treatments $Self Care/Home Management : 8-22 mins  [place]ra [place], OTR/L Acute Rehab Pager: [phone] Office: [place] 10/05/2017, 10:38 AM

## 2017-10-05 NOTE — Progress Notes (Signed)
PULMONARY / CRITICAL CARE MEDICINE   Name: Tony Jones MRN: [id number] DOB: May 24, 1960    ADMISSION DATE:  09/30/2017 CONSULTATION DATE:  10/03/2017  REFERRING MD:  Aundra Dubin  CHIEF COMPLAINT:  Hypoxia for possible CPAP   HISTORY OF PRESENT ILLNESS:   Mr Tony Jones is a 57 yr old male with some mental delay, severe pulm HTN, right sided heart failure and obesity among other medical problems who was admitted due to worsening hypoxemia requiring high flow nasal canula. Mother was at bedside and most of the history was taken from her. He has no history of smoking and states that he has been using 2 litres oxygen but lately it had to be increased. He is very compliant to his meds. He has no history of ILD and had no pulm HTN 2 yrs ago. He had a VQ scan in the past which showed no VQ mismatch. He had no sleep studies in the past. His brother and father had OSA. He was admitted with CHF in 2016   As per mother he has been having worsening of his lower limb swelling and abdominal girth lately. He denies any fever chills or rigors. As per mother he was born with severe hypotonia and spent most of his childhood in the hospital and eventually gained some tone back.   SUBJECTIVE:  Refused CPAP overnight.   For RHC +/- LHC today  Otherwise no acute change.    VITAL SIGNS: BP 115/79 (BP Location: Left Arm)   Pulse (!) 102   Temp 97.7 F (36.5 C) (Oral)   Resp 19   Ht 5\' 1"  (1.549 m)   Wt 87.7 kg (193 lb 6.4 oz)   SpO2 100%   BMI 36.54 kg/m   HEMODYNAMICS: CVP:  [5 mmHg-11 mmHg] 5 mmHg  VENTILATOR SETTINGS:    INTAKE / OUTPUT: I/O last 3 completed shifts: In: 600 [P.O.:600] Out: 3305 [Urine:3305]  PHYSICAL EXAMINATION: General: obese male, NAD in bed  Neuro: sleepy but wakes easily, follows commands,  alert oriented X3  , MAE x 4, delayed  HEENT: MM Pink and moist, No LAD  Cardiovascular:  S1, S2, RRR, No RMG Lungs: Diminished throughout, NO audible wheeze  Abdomen: obese , NT, ND, BS  + Musculoskeletal:  2-3+ BLE edema Skin:  No rash , warm and dry, no lesions  LABS:  BMET Recent Labs  Lab 10/03/17 0500 10/04/17 1120 10/05/17 0500  NA 141 145 140  K 3.7 3.9 4.2  CL 95* 91* 86*  CO2 39* 43* 43*  BUN 24* 24* 23*  CREATININE 1.50* 1.46* 1.46*  GLUCOSE 115* 112* 139*    Electrolytes Recent Labs  Lab 10/03/17 0500 10/04/17 1120 10/05/17 0500  CALCIUM 8.0* 9.1 9.0    CBC Recent Labs  Lab 10/03/17 0500 10/04/17 1120 10/05/17 0500  WBC 9.2 8.7 10.4  HGB 7.7* 8.4* 8.6*  HCT 29.8* 32.1* 33.4*  PLT 162 173 164    Coag's Recent Labs  Lab 09/30/17 2317  INR 1.19    Sepsis Markers No results for input(s): LATICACIDVEN, PROCALCITON, O2SATVEN in the last 168 hours.  ABG No results for input(s): PHART, PCO2ART, PO2ART in the last 168 hours.  Liver Enzymes Recent Labs  Lab 09/30/17 2317  AST 22  ALT 28  ALKPHOS 141*  BILITOT 1.0  ALBUMIN 3.2*    Cardiac Enzymes Recent Labs  Lab 09/30/17 2317  TROPONINI 0.03*    Glucose Recent Labs  Lab 10/03/17 2204 10/04/17 0601 10/04/17 1101 10/04/17 1607  10/04/17 2108 10/05/17 0601  GLUCAP 133* 120* 107* 141* 148* 115*    Imaging Dg Chest Port 1 View  Result Date: 10/05/2017 CLINICAL DATA:  Shortness of breath. EXAM: PORTABLE CHEST 1 VIEW COMPARISON:  10/03/2017 FINDINGS: Right PICC line in stable position. Cardiomediastinal silhouette is normal. Mediastinal contours appear intact. Interval development of mixed pattern pulmonary edema with more confluent airspace consolidation in the left lower thorax. Osseous structures are without acute abnormality. Soft tissues are grossly normal. IMPRESSION: Interval development of mixed pattern pulmonary edema. More confluent airspace consolidation versus atelectasis in the left lower thorax. Electronically Signed   By: Fidela Salisbury M.D.   On: 10/05/2017 09:18      ASSESSMENT / PLAN:  57 year old obese male with PMH of cardiac disease as  well as OSA.  PCCM was consulted for OSA, hypoxemic respiratory failure and pulmonary HTN.   Acute on Chronic hypoxemic respiratory failure: multi-factorial with CHF, pulmonary edema, pleural effusion, pulmonary HTN and suspected OSA/OHS   - Titrate O2 for sat of 88-92%  - diuresis as below   - CPAP - pt refusing   - outpt PFT's   Pulmonary edema:  - Diureses as able  - Cards managing heart failure  - consider thoracentesis if effusion does not improve further with diuresis   - f/u CXR   Pulmonary HTN: PA peak pressure 7mmHg  PLAN -   - For RHC +/- LHC today    OSA: refusing CPAP  - Not sure if acquiring CPAP for him will be beneficial as he refuses to wear it - have counseled extensively on importance   - Will need behavioral modification  - outpt sleep study if pt willing     Nickolas Madrid, NP 10/05/2017  9:54 AM Pager: [phone] or [phone]

## 2017-10-05 NOTE — Progress Notes (Signed)
Patient was not able to tolerate CPAP last night because of wrong mask. RT will continue to monitor and place patient on CPAP if anything changes.

## 2017-10-05 NOTE — Interval H&P Note (Signed)
History and Physical Interval Note:  10/05/2017 2:24 PM  Tony Jones  has presented today for surgery, with the diagnosis of hf  The various methods of treatment have been discussed with the patient and family. After consideration of risks, benefits and other options for treatment, the patient has consented to  Procedure(s): RIGHT/LEFT HEART CATH AND CORONARY ANGIOGRAPHY (N/A) as a surgical intervention .  The patient's history has been reviewed, patient examined, no change in status, stable for surgery.  I have reviewed the patient's chart and labs.  Questions were answered to the patient's satisfaction.     Kelii Chittum [employer]

## 2017-10-05 NOTE — Progress Notes (Signed)
Physical Therapy Treatment Patient Details Name: Tony Jones MRN: [id number] DOB: 12-07-1960 Today's Date: 10/05/2017    History of Present Illness Pt is a 57 y.o. male with complex history developmental delay, Muscular dystrophy, chronic hypoxia with witnessed apnea (would not be tolerant of CPAP so uses O2), Barrett's esophagus, anemia, anxiety, cholelithasis, nephrolithiasis, DM, dyslipidemia, GERD, HTN, pulmonary HTN and right heart failure who was admitted directly to the hospital for further evaluation of recent malaise and failure to thrive. He does very little walking, but he has been SOB with minimal exertion.    PT Comments    Patient seen for activity progression. Mobilized in hall without assistive device today. Ambulated on supplemental O2 with saturations fluctuating mid 80s to upper 90s. Questionable validity of reading as patient asymptomatic and conversational throughout activity. Current POC remains appropriate.   Follow Up Recommendations  Home health PT;Supervision for mobility/OOB     Equipment Recommendations  None recommended by PT    Recommendations for Other Services       Precautions / Restrictions Precautions Precautions: Other (comment) Precaution Comments: has low SpO2 but not syptomatic Restrictions Weight Bearing Restrictions: No    Mobility  Bed Mobility               General bed mobility comments: received EOB  Transfers Overall transfer level: Needs assistance Equipment used: 1 person hand held assist Transfers: Sit to/from Stand Sit to Stand: Min guard         General transfer comment: Min guard for safety +2 for line management. Performed multiple times during session  Ambulation/Gait Ambulation/Gait assistance: Min assist Ambulation Distance (Feet): 140 Feet Assistive device: None Gait Pattern/deviations: Step-through pattern;Decreased stride length Gait velocity: decr Gait velocity interpretation: 1.31 - 2.62 ft/sec,  indicative of limited community ambulator General Gait Details: Ambulated on 8 liters [place], questionable reading as patient conversational thorughout session. Min assist for stability due to some anterior propulsion   Stairs             Wheelchair Mobility    Modified Rankin (Stroke Patients Only)       Balance Overall balance assessment: Mild deficits observed, not formally tested                                          Cognition Arousal/Alertness: Awake/alert Behavior During Therapy: WFL for tasks assessed/performed Overall Cognitive Status: History of cognitive impairments - at baseline                                 General Comments: max multi modal cues to direct to task, perseverative throughout      Exercises      General Comments        Pertinent Vitals/Pain Pain Assessment: Faces Faces Pain Scale: Hurts a little bit Pain Location: generalized and verbally reported when attempting to urinate Pain Descriptors / Indicators: Grimacing Pain Intervention(s): Monitored during session    Home Living                      Prior Function            PT Goals (current goals can now be found in the care plan section) Acute Rehab PT Goals Patient Stated Goal: to get his hair cut PT Goal Formulation: With patient/family Time  For Goal Achievement: 10/16/17 Potential to Achieve Goals: Good Progress towards PT goals: Progressing toward goals    Frequency    Min 3X/week      PT Plan Current plan remains appropriate    Co-evaluation              AM-PAC PT "6 Clicks" Daily Activity  Outcome Measure  Difficulty turning over in bed (including adjusting bedclothes, sheets and blankets)?: A Little Difficulty moving from lying on back to sitting on the side of the bed? : A Little Difficulty sitting down on and standing up from a chair with arms (e.g., wheelchair, bedside commode, etc,.)?: A Little Help needed  moving to and from a bed to chair (including a wheelchair)?: A Little Help needed walking in hospital room?: A Little Help needed climbing 3-5 steps with a railing? : A Little 6 Click Score: 18    End of Session Equipment Utilized During Treatment: Gait belt;Oxygen Activity Tolerance: Patient tolerated treatment well Patient left: in chair;with call bell/phone within reach;with family/visitor present Nurse Communication: Mobility status;Other (comment)(SpO2 levels) PT Visit Diagnosis: Other abnormalities of gait and mobility (R26.89);Muscle weakness (generalized) (M62.81)     Time: 4403-4742 PT Time Calculation (min) (ACUTE ONLY): 16 min  Charges:  $Gait Training: 8-22 mins                    G Codes:       Alben Deeds, PT DPT  Board Certified Neurologic Specialist [place] 10/05/2017, 10:02 AM

## 2017-10-05 NOTE — Progress Notes (Signed)
PULMONARY / CRITICAL CARE MEDICINE   Name: Tony Jones MRN: [id number] DOB: [birth year]    ADMISSION DATE:  09/30/2017 CONSULTATION DATE:  10/03/2017  REFERRING MD:  Aundra Dubin  CHIEF COMPLAINT:  Hypoxia for possible CPAP   HISTORY OF PRESENT ILLNESS:   Mr Illes is a 57 yr old male with some mental delay, severe pulm HTN, right sided heart failure and obesity among other medical problems who was admitted due to worsening hypoxemia requiring high flow nasal canula. Mother was at bedside and most of the history was taken from her. He has no history of smoking and states that he has been using 2 litres oxygen but lately it had to be increased. He is very compliant to his meds. He has no history of ILD and had no pulm HTN 2 yrs ago. He had a VQ scan in the past which showed no VQ mismatch. He had no sleep studies in the past. His brother and father had OSA. He was admitted with CHF in 2016   As per mother he has been having worsening of his lower limb swelling and abdominal girth lately. He denies any fever chills or rigors. As per mother he was born with severe hypotonia and spent most of his childhood in the hospital and eventually gained some tone back.     VITAL SIGNS: BP 131/89 (BP Location: Left Arm)   Pulse 96   Temp 98.3 F (36.8 C) (Axillary)   Resp 16   Ht 5\' 1"  (1.549 m)   Wt 193 lb 6.4 oz (87.7 kg)   SpO2 99%   BMI 36.54 kg/m   HEMODYNAMICS: CVP:  [5 mmHg-11 mmHg] 7 mmHg  VENTILATOR SETTINGS:    INTAKE / OUTPUT: I/O last 3 completed shifts: In: 600 [P.O.:600] Out: 3305 [Urine:3305]  PHYSICAL EXAMINATION: General: Obese male, resting in exam bed, NAD Neuro: Awake, following commands, delayed response to questions HEENT: [place]/AT, PERRL, EOM-I and MMM Cardiovascular:  RRR, Nl S1/S2 and -M/R/G Lungs: Decreased BS diffusely, crackles at the bases Abdomen: Obese, soft, NT, ND and +BS Musculoskeletal:  2-3+ BLE edema Skin:  No rash , warm and dry, no  lesions  LABS:  BMET Recent Labs  Lab 10/03/17 0500 10/04/17 1120 10/05/17 0500  NA 141 145 140  K 3.7 3.9 4.2  CL 95* 91* 86*  CO2 39* 43* 43*  BUN 24* 24* 23*  CREATININE 1.50* 1.46* 1.46*  GLUCOSE 115* 112* 139*    Electrolytes Recent Labs  Lab 10/03/17 0500 10/04/17 1120 10/05/17 0500  CALCIUM 8.0* 9.1 9.0    CBC Recent Labs  Lab 10/03/17 0500 10/04/17 1120 10/05/17 0500  WBC 9.2 8.7 10.4  HGB 7.7* 8.4* 8.6*  HCT 29.8* 32.1* 33.4*  PLT 162 173 164    Coag's Recent Labs  Lab 09/30/17 2317  INR 1.19    Sepsis Markers No results for input(s): LATICACIDVEN, PROCALCITON, O2SATVEN in the last 168 hours.  ABG No results for input(s): PHART, PCO2ART, PO2ART in the last 168 hours.  Liver Enzymes Recent Labs  Lab 09/30/17 2317  AST 22  ALT 28  ALKPHOS 141*  BILITOT 1.0  ALBUMIN 3.2*    Cardiac Enzymes Recent Labs  Lab 09/30/17 2317  TROPONINI 0.03*    Glucose Recent Labs  Lab 10/04/17 0601 10/04/17 1101 10/04/17 1607 10/04/17 2108 10/05/17 0601 10/05/17 1058  GLUCAP 120* 107* 141* 148* 115* 131*    Imaging Dg Chest Port 1 View  Result Date: 10/05/2017 CLINICAL DATA:  Shortness  of breath. EXAM: PORTABLE CHEST 1 VIEW COMPARISON:  10/03/2017 FINDINGS: Right PICC line in stable position. Cardiomediastinal silhouette is normal. Mediastinal contours appear intact. Interval development of mixed pattern pulmonary edema with more confluent airspace consolidation in the left lower thorax. Osseous structures are without acute abnormality. Soft tissues are grossly normal. IMPRESSION: Interval development of mixed pattern pulmonary edema. More confluent airspace consolidation versus atelectasis in the left lower thorax. Electronically Signed   By: Fidela Salisbury M.D.   On: 10/05/2017 09:18      ASSESSMENT / PLAN:  57 year old obese male with PMH of significant cardiac disease admitted for hypoxemic respiratory failure, pulmonary HTN  And  OSA.  Patient continues to refuse CPAP at night.  On exam, decreased BS at both bases.  I reviewed CXR myself, pulmonary edema noted.  Unfortunately, patient refused CPAP overnight.  Discussed with PCCM-NP.  Chronic hypoxemic respiratory failure: multi-factorial with CHF, pulmonary edema, pulmonary HTN and body habitus.   - Titrate O2 for sat of 88-92%  - D/C HFNC and place on regular canula  - Will need an ambulatory desaturation study prior to discharge for home O2 with target sat of 90 or above not 88.  Pulmonary edema:  - Diureses as able  - Heart failure team managing  - Would avoid thora given that this is all heart failure related  Pulmonary HTN:  - Would require a formal work up as I do not believe this is strictly OSA related, OSA classically increases pulmonary artery pressure to 40-50 not more.  - ?rigth heart cath  OSA: refusing CPAP  - Not sure if acquiring CPAP for him will be beneficial as he refuses to wear it  - Will need behavioral modification  - ?if a sleep study as outpatient is beneficial given refusal of CPAP  PCCM will sign off, please call back if needed.  Rush Farmer, M.D. [facility] Pulmonary/Critical Care Medicine. Pager: [phone]. After hours pager: [phone].

## 2017-10-05 NOTE — Progress Notes (Signed)
Spoke to [place] from Heart Failure team. She confirmed that pt can have a clear liquid breakfast before cath lab at 3pm.  Clyde Canterbury, RN

## 2017-10-05 NOTE — Progress Notes (Addendum)
Patient ID: Tony Jones, male   DOB: [birth year], 57 y.o.   MRN: [id number]     Advanced Heart Failure Rounding Note  PCP-Cardiologist: No primary care provider on file.   Subjective:    Coox 56.8% this am. BMET and CBC again pending. RN states they are not showing up in work flow despite daily orders since 10/02/17.   EGD 10/04/17 cancelled due to hypoxia.   CVP 5-6. Weight down another 2 lbs.   Having severe nocturnal hypoxia, with readings as low as 40% on pulse ox, though ? Accuracy. Had to be woken up multiple times through the night with improvement.   He refuses CPAP as his father was wearing one when he passed away.   When awake, can turn HFNC as low as 6 lpm and sat at 100%, but when napping sats in 70-80s on 12-15 lpm.   High res Chest CT 10/01/17: Limited evaluation for ILD because of presence of pulmonary edema. The RV and PA were dilated.  There was extensive coronary calcification.   Abd [place] 10/02/17: Cholelithiasis without evidence for cholecystitis. Hepatic steatosis.  Small ascites.   Objective:   Weight Range: 193 lb 6.4 oz (87.7 kg) Body mass index is 36.54 kg/m.   Vital Signs:   Temp:  [96.9 F (36.1 C)-98.9 F (37.2 C)] 97.7 F (36.5 C) (06/05 0741) Pulse Rate:  [93-109] 102 (06/05 0741) Resp:  [14-22] 19 (06/05 0741) BP: (103-120)/(62-83) 115/79 (06/05 0741) SpO2:  [91 %-100 %] 100 % (06/05 0741) Weight:  [193 lb 6.4 oz (87.7 kg)] 193 lb 6.4 oz (87.7 kg) (06/05 0603) Last BM Date: 10/02/17  Weight change: Filed Weights   10/03/17 0605 10/04/17 0500 10/05/17 0603  Weight: 201 lb 8 oz (91.4 kg) 195 lb 8 oz (88.7 kg) 193 lb 6.4 oz (87.7 kg)   Intake/Output:   Intake/Output Summary (Last 24 hours) at 10/05/2017 0750 Last data filed at 10/04/2017 2331 Gross per 24 hour  Intake 480 ml  Output 1925 ml  Net -1445 ml    Physical Exam    General: NAD  HEENT: Normal Neck: Supple. JVP 6-7. Carotids 2+ bilat; no bruits. No thyromegaly or nodule noted. Cor: PMI  nondisplaced. RRR, No M/G/R noted Lungs: CTAB, normal effort. Abdomen: Soft, non-tender, non-distended, no HSM. No bruits or masses. +BS  Extremities: No cyanosis, clubbing, or rash. Trace ankle edema.  Neuro: Alert & orientedx3, cranial nerves grossly intact. moves all 4 extremities w/o difficulty. Affect pleasant   Telemetry   Sinus tach 100-110s, personally reviewed.   Labs    CBC Recent Labs    10/03/17 0500 10/04/17 1120  WBC 9.2 8.7  NEUTROABS 8.0* 7.7  HGB 7.7* 8.4*  HCT 29.8* 32.1*  MCV 76.0* 76.8*  PLT 162 761   Basic Metabolic Panel Recent Labs    10/03/17 0500 10/04/17 1120  NA 141 145  K 3.7 3.9  CL 95* 91*  CO2 39* 43*  GLUCOSE 115* 112*  BUN 24* 24*  CREATININE 1.50* 1.46*  CALCIUM 8.0* 9.1   Liver Function Tests No results for input(s): AST, ALT, ALKPHOS, BILITOT, PROT, ALBUMIN in the last 72 hours. No results for input(s): LIPASE, AMYLASE in the last 72 hours. Cardiac Enzymes No results for input(s): CKTOTAL, CKMB, CKMBINDEX, TROPONINI in the last 72 hours.  BNP: BNP (last 3 results) Recent Labs    07/30/17 0950 09/30/17 2317  BNP 290.8* 928.6*    ProBNP (last 3 results) No results for input(s): PROBNP in  the last 8760 hours.   D-Dimer No results for input(s): DDIMER in the last 72 hours. Hemoglobin A1C No results for input(s): HGBA1C in the last 72 hours. Fasting Lipid Panel Recent Labs    10/03/17 0500  CHOL 106  HDL 31*  LDLCALC 56  TRIG 94  CHOLHDL 3.4   Thyroid Function Tests No results for input(s): TSH, T4TOTAL, T3FREE, THYROIDAB in the last 72 hours.  Invalid input(s): FREET3  Other results:   Imaging    No results found.   Medications:     Scheduled Medications: . allopurinol  300 mg Oral Q supper  . atorvastatin  40 mg Oral q1800  . fesoterodine  8 mg Oral Q supper  . furosemide  80 mg Intravenous BID  . heparin  5,000 Units Subcutaneous Q8H  . insulin aspart  0-9 Units Subcutaneous TID WC  .  midodrine  10 mg Oral TID WC  . multivitamin with minerals  1 tablet Oral Q breakfast  . oxybutynin  5 mg Oral BID WC  . pantoprazole  40 mg Oral BID WC  . potassium chloride  40 mEq Oral BID  . sertraline  100 mg Oral Q breakfast  . sodium chloride flush  3 mL Intravenous Q12H  . sodium chloride flush  3 mL Intravenous Q12H    Infusions: . sodium chloride    . sodium chloride    . sodium chloride 10 mL/hr at 10/05/17 0615    PRN Medications: sodium chloride, sodium chloride, acetaminophen, docusate sodium, ondansetron, sodium chloride flush, sodium chloride flush, sodium chloride flush   Assessment/Plan   1. RV failure/pulmonary HTN: - Suspect the patient has severe RV failure in the setting of severe pulmonary hypertension. Echo showed flattened septum and severe pulmonary hypertension. On CT chest RV and PA were dilated. 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  Cause of this is uncertain, possible OHS/OSA.  He has not had a sleep study to diagnosis OSA but it is strongly suspected.  V/Q scan in 2016 did not suggest chronic PE. Thing group 1 PH is probably unlikely. Hypotension with need for midodrine is concerning for severe RV failure,  - Coox 56.8%.  - Volume status improved.  - Hold lasix pending BMET, for cath today.  - Continue midodrine 10 mg tid to maintain BP. - Plan RHC +/- LHC this afternoon.  2. Chronic hypoxemic respiratory failure:  - Uncertain etiology, as above suspect OHS/OSA.   - High resolution chest CT was limited by pulmonary edema but did not appear to show interstitial lung disease.  PCCM agrees. - CCM has seen and ordered CPAP QHS. Pt unable to tolerate wearing. Recommend outpatient PFTs.  - HFNC with flow up to 15 with napping, stable on 6 lpm while awake.  3. CKD: Stage 3 - BMET pending.  4. Cholelithiasis - Abdomen is not tender today and he has been eating meals.  Has had symptomatic cholelithiasis and cholecystectomy recommended. RUQ  [place] 6/1, cholelithiasis with no evidence for active cholecystitis.    - Once he is stabilized, will involve surgery.  - No change to current plan.   5. Anemia - Chronic, with Fe deficiency. He had a dose of IV Fe.  Most recently, had UGIB in 3/19 with EGD showing no definite cause for bleeding (other than gastritis).  - Occult stool positive. EGD 10/04/17 cancelled due to hypoxia.  - Continue BID PPI.  - Will transfuse if Hgb < 7.0. No definite bleeding. CBC pending.  6. Developmental delay - Mother at bedside to assist communication and consent procedure. - No change to current plan.    7. Undefined muscular dystrophy - Walked in hall today with PT.  - No change to current plan.   8. CAD - Strong family history of premature coronary disease.  He has extensive coronary calcification on CT chest this admission.  He has epigastric discomfort that has been attributed to symptomatic cholelithiasis, but given extensive coronary calcification, concerned it could actually be angina.  - Continue statin.  - No ASA yet with low Hgb and GI bleed.  - Would consider coronary angiography along with RHC if creatinine stays stable.  - No change to current plan.    Length of Stay: 5  Annamaria Helling  10/05/2017, 7:50 AM  Advanced Heart Failure Team Pager [phone] (M-F; 7a - 4p)  Please contact [facility] for night-coverage after hours (4p -7a ) and weekends on [email or web address]  Patient seen with PA, agree with the above note.  Cath as below.  Will restart IV Lasix tonight.  When filling pressures are lower, likely tomorrow, will start sildenafil 20 mg tid.  Possible mixed group 1 and group 3 PH (OHS/OSA and ?group 1 component).    RHC/LHC:  Coronary Findings  Diagnostic  Dominance: Right  Left Main  No left main, separate ostia LAD and LCx.  Left Anterior Descending  Separate ostium of LAD. LAD has luminal irregularities.  Left Circumflex  Separate ostium of LCx. Luminal irregularities.   Right Coronary Artery  Large caliber RCA with luminal irregularities. 50% stenosis ostial PDA.  Intervention   No interventions have been documented.  Right Heart   Right Heart Pressures    RHC Procedural Findings: Hemodynamics (mmHg) RA 16 RV 92/16 PA 93/51 mean 67 PCWP mean 27 LV 110/21 AO 115/75  Oxygen saturations: PA 56% AO 99%  Cardiac Output (Fick) 4.95  Cardiac Index (Fick) 2.65 PVR 8 WU PAPi 2.65    Loralie Champagne 10/05/2017 4:17 PM

## 2017-10-06 ENCOUNTER — Encounter (HOSPITAL_COMMUNITY): Payer: Self-pay | Admitting: Cardiology

## 2017-10-06 LAB — CBC WITH DIFFERENTIAL/PLATELET
Basophils Absolute: 0 10*3/uL (ref 0.0–0.1)
Basophils Relative: 0 %
Eosinophils Absolute: 0.1 10*3/uL (ref 0.0–0.7)
Eosinophils Relative: 2 %
HEMATOCRIT: 31.9 % — AB (ref 39.0–52.0)
Hemoglobin: 8.4 g/dL — ABNORMAL LOW (ref 13.0–17.0)
LYMPHS ABS: 0.6 10*3/uL — AB (ref 0.7–4.0)
Lymphocytes Relative: 9 %
MCH: 20.7 pg — ABNORMAL LOW (ref 26.0–34.0)
MCHC: 26.3 g/dL — AB (ref 30.0–36.0)
MCV: 78.6 fL (ref 78.0–100.0)
MONO ABS: 0.3 10*3/uL (ref 0.1–1.0)
MONOS PCT: 4 %
NEUTROS ABS: 5.7 10*3/uL (ref 1.7–7.7)
Neutrophils Relative %: 85 %
Platelets: 167 10*3/uL (ref 150–400)
RBC: 4.06 MIL/uL — ABNORMAL LOW (ref 4.22–5.81)
RDW: 22.4 % — AB (ref 11.5–15.5)
WBC: 6.7 10*3/uL (ref 4.0–10.5)

## 2017-10-06 LAB — BASIC METABOLIC PANEL
Anion gap: 5 (ref 5–15)
BUN: 18 mg/dL (ref 6–20)
CALCIUM: 9 mg/dL (ref 8.9–10.3)
CO2: 46 mmol/L — AB (ref 22–32)
CREATININE: 1.39 mg/dL — AB (ref 0.61–1.24)
Chloride: 87 mmol/L — ABNORMAL LOW (ref 101–111)
GFR calc non Af Amer: 55 mL/min — ABNORMAL LOW (ref >60)
GLUCOSE: 99 mg/dL (ref 65–99)
Potassium: 4.4 mmol/L (ref 3.5–5.1)
Sodium: 138 mmol/L (ref 135–145)

## 2017-10-06 LAB — COOXEMETRY PANEL
CARBOXYHEMOGLOBIN: 2.5 % — AB (ref 0.5–1.5)
Methemoglobin: 0.9 % (ref 0.0–1.5)
O2 SAT: 61 %
TOTAL HEMOGLOBIN: 8.6 g/dL — AB (ref 12.0–16.0)

## 2017-10-06 LAB — GLUCOSE, CAPILLARY
GLUCOSE-CAPILLARY: 104 mg/dL — AB (ref 65–99)
GLUCOSE-CAPILLARY: 148 mg/dL — AB (ref 65–99)
GLUCOSE-CAPILLARY: 133 mg/dL — AB (ref 65–99)
GLUCOSE-CAPILLARY: 160 mg/dL — AB (ref 65–99)
Glucose-Capillary: 181 mg/dL — ABNORMAL HIGH (ref 65–99)

## 2017-10-06 MED ORDER — SILDENAFIL CITRATE 20 MG PO TABS
20.0000 mg | ORAL_TABLET | Freq: Three times a day (TID) | ORAL | Status: DC
  Administered 2017-10-06 – 2017-10-07 (×4): 20 mg via ORAL
  Filled 2017-10-06 (×5): qty 1

## 2017-10-06 NOTE — Progress Notes (Deleted)
Patient ID: Tony Jones, male   DOB: 1960/07/02, 57 y.o.   MRN: [id number]     Advanced Heart Failure Rounding Note  PCP-Cardiologist: No primary care provider on file.   Subjective:    [place] 56.8% this am. BMET and CBC again pending. RN states they are not showing up in work flow despite daily orders since 10/02/17.   EGD 10/04/17 cancelled due to hypoxia.   CVP 11.. Weight down another 2 lbs.   Cath yesterday with non-obstructive CAD and severe PAH with volume overload. Denies SOB or CP currently. Continues to struggle with oxygenation while sleeping. Unable to wear CPAP last night due to "wrong mask".Willing to try CPAP with brothers nasal pillows.   L/RHC 10/05/17 - Non-obstructive CAD Hemodynamics (mmHg) RA 16 RV 92/16 PA 93/51 mean 67 PCWP mean 27 LV 110/21 AO 115/75 Oxygen saturations: PA 56% AO 99% Cardiac Output (Fick) 4.95  Cardiac Index (Fick) 2.65 PVR 8 WU PAPi 2.65  High res Chest CT 10/01/17: Limited evaluation for ILD because of presence of pulmonary edema. The RV and PA were dilated.  There was extensive coronary calcification.   Abd [place] 10/02/17: Cholelithiasis without evidence for cholecystitis. Hepatic steatosis.  Small ascites.   Objective:   Weight Range: 193 lb 8 oz (87.8 kg) Body mass index is 36.56 kg/m.   Vital Signs:   Temp:  [97.7 F (36.5 C)-98.7 F (37.1 C)] 97.7 F (36.5 C) (06/06 0410) Pulse Rate:  [0-288] 95 (06/06 0410) Resp:  [0-23] 23 (06/06 0410) BP: (105-139)/(68-89) 110/72 (06/06 0410) SpO2:  [0 %-100 %] 96 % (06/06 0410) Weight:  [193 lb 8 oz (87.8 kg)] 193 lb 8 oz (87.8 kg) (06/06 0646) Last BM Date: 10/02/17  Weight change: Filed Weights   10/04/17 0500 10/05/17 0603 10/06/17 0646  Weight: 195 lb 8 oz (88.7 kg) 193 lb 6.4 oz (87.7 kg) 193 lb 8 oz (87.8 kg)   Intake/Output:   Intake/Output Summary (Last 24 hours) at 10/06/2017 1049 Last data filed at 10/06/2017 0900 Gross per 24 hour  Intake 720 ml  Output 1400 ml  Net -680 ml      Physical Exam    CVP 10-11  General: No resp difficulty. HEENT: Normal Neck: Supple. JVP difficult due to body habitus. Carotids 2+ bilat; no bruits. No thyromegaly or nodule noted. Cor: PMI nondisplaced. RRR, No M/G/R noted Lungs: CTAB, normal effort. Abdomen: Soft, non-tender, non-distended, no HSM. No bruits or masses. +BS  Extremities: No cyanosis, clubbing, or rash. R and LLE no edema.  Neuro: Alert & orientedx3, cranial nerves grossly intact. moves all 4 extremities w/o difficulty. Affect flat  Telemetry   Sinus Tach 100-110s, personally reviewed.   Labs    CBC Recent Labs    10/05/17 0500 10/06/17 0500  WBC 10.4 6.7  NEUTROABS 9.7* 5.7  HGB 8.6* 8.4*  HCT 33.4* 31.9*  MCV 78.8 78.6  PLT 164 132   Basic Metabolic Panel Recent Labs    10/05/17 0500 10/06/17 0500  NA 140 138  K 4.2 4.4  CL 86* 87*  CO2 43* 46*  GLUCOSE 139* 99  BUN 23* 18  CREATININE 1.46* 1.39*  CALCIUM 9.0 9.0   Liver Function Tests No results for input(s): AST, ALT, ALKPHOS, BILITOT, PROT, ALBUMIN in the last 72 hours. No results for input(s): LIPASE, AMYLASE in the last 72 hours. Cardiac Enzymes No results for input(s): CKTOTAL, CKMB, CKMBINDEX, TROPONINI in the last 72 hours.  BNP: BNP (last 3 results) Recent  Labs    07/30/17 0950 09/30/17 2317  BNP 290.8* 928.6*    ProBNP (last 3 results) No results for input(s): PROBNP in the last 8760 hours.   D-Dimer No results for input(s): DDIMER in the last 72 hours. Hemoglobin A1C No results for input(s): HGBA1C in the last 72 hours. Fasting Lipid Panel No results for input(s): CHOL, HDL, LDLCALC, TRIG, CHOLHDL, LDLDIRECT in the last 72 hours. Thyroid Function Tests No results for input(s): TSH, T4TOTAL, T3FREE, THYROIDAB in the last 72 hours.  Invalid input(s): FREET3  Other results:   Imaging    No results found.   Medications:     Scheduled Medications: . allopurinol  300 mg Oral Q supper  .  atorvastatin  40 mg Oral q1800  . fesoterodine  8 mg Oral Q supper  . furosemide  80 mg Intravenous BID  . heparin  5,000 Units Subcutaneous Q8H  . insulin aspart  0-9 Units Subcutaneous TID WC  . midodrine  10 mg Oral TID WC  . multivitamin with minerals  1 tablet Oral Q breakfast  . oxybutynin  5 mg Oral BID WC  . pantoprazole  40 mg Oral BID WC  . potassium chloride  40 mEq Oral BID  . sertraline  100 mg Oral Q breakfast  . sildenafil  20 mg Oral TID  . sodium chloride flush  3 mL Intravenous Q12H  . sodium chloride flush  3 mL Intravenous Q12H    Infusions: . sodium chloride    . sodium chloride      PRN Medications: sodium chloride, sodium chloride, acetaminophen, docusate sodium, ondansetron (ZOFRAN) IV, ondansetron, sodium chloride flush, sodium chloride flush, sodium chloride flush   Assessment/Plan   1. RV failure/pulmonary HTN: - Cath 10/05/17 with severe PAH -> RA 16, RV 92/16, PA 93/51 mean 67, PCWP mean 27, PVR 8 WU, PAPi 2.65 - Suspect Group 3 with possible Group 1 component.  - Starting on sildenafil 20 mg TID.  - Cardiac output preserved on cath. - Volume status remains elevated. CVP 10-11 - Continue 80 mg IV lasix BID.  - Continue midodrine 10 mg tid to maintain BP. 2. Chronic hypoxemic respiratory failure:  - With severe PAH and suspected OHS/OSA as above.  - High resolution chest CT was limited by pulmonary edema but did not appear to show interstitial lung disease.  PCCM agrees. - CCM has seen and ordered CPAP QHS. Pt unable to tolerate wearing. Recommend outpatient PFTs.  - HFNC with flow up to 15 with napping, stable on 6 lpm while awake.  3. CKD: Stage 3 - Creatinine stable to improved at 1.39.  4. Cholelithiasis - Abdomen is not tender and he has been eating meals.  Has had symptomatic cholelithiasis and cholecystectomy recommended. RUQ [place] 6/1, cholelithiasis with no evidence for active cholecystitis.    - Once he is stabilized, will involve surgery.    - No change to current plan.   5. Anemia - Chronic, with Fe deficiency. He had a dose of IV Fe.  Most recently, had UGIB in 3/19 with EGD showing no definite cause for bleeding (other than gastritis).  - Occult stool positive. EGD 10/04/17 cancelled due to hypoxia.  - Continue BID PPI.  - Hgb stable at 8.4. Transfuse if Hgb < 7.0. No definite bleeding.  6. Developmental delay - Mother at bedside to assist communication and consent procedure. - No change to current plan.   7. Undefined muscular dystrophy - Walked in hall today with PT.  -  No change to current plan.   8. CAD - Strong family history of premature coronary disease.  He has extensive coronary calcification on CT chest this admission.  He has epigastric discomfort that has been attributed to symptomatic cholelithiasis, but given extensive coronary calcification, concerned it could actually be angina.  - Continue statin.  - No ASA yet with low Hgb and GI bleed.  - Would consider coronary angiography along with RHC if creatinine stays stable.  - No change to current plan.    Length of Stay: [address]  Annamaria Helling  10/06/2017, 10:49 AM  Advanced Heart Failure Team Pager [phone] (M-F; 7a - 4p)  Please contact [facility] for night-coverage after hours (4p -7a ) and weekends on [email or web address]

## 2017-10-06 NOTE — Progress Notes (Signed)
Spoke with patients mother regarding CPAP and usage of nasal pillows. Mother states respiratory was supposed to come during the day while patient was awake to try out CPAP. Mother states now that patient is sleeping, she is very doubtful that he will be willing to even try. RT will pass along to day shift to try out nasal pillows while patient is awake and hopefully able to tolerate better.

## 2017-10-06 NOTE — Progress Notes (Signed)
Patient ID: Tony Jones, male   DOB: 12-28-1960, 57 y.o.   MRN: [id number]     Advanced Heart Failure Rounding Note  PCP-Cardiologist: No primary care provider on file.   Subjective:    Patient diuresed some with IV Lasix yesterday.  Did not try CPAP last night as he did not have nasal pillows.    EGD 10/04/17 cancelled due to hypoxia. Hgb remains stable this morning without overt bleeding.   CVP 5-6. Weight down another 2 lbs.   Having severe nocturnal hypoxia, with readings as low as 40% on pulse ox, though ? Accuracy. Had to be woken up multiple times through the night with improvement.   He refuses CPAP as his father was wearing one when he passed away.   When awake, can turn HFNC as low as 6 lpm and sat at 100%, but when napping sats in 70-80s on 12-15 lpm.   High res Chest CT 10/01/17: Limited evaluation for ILD because of presence of pulmonary edema. The RV and PA were dilated.  There was extensive coronary calcification.   Abd [place] 10/02/17: Cholelithiasis without evidence for cholecystitis. Hepatic steatosis.  Small ascites.   RHC/LHC (6/5):  Coronary Findings  Diagnostic  Dominance: Right  Left Main  No left main, separate ostia LAD and LCx.  Left Anterior Descending  Separate ostium of LAD. LAD has luminal irregularities.  Left Circumflex  Separate ostium of LCx. Luminal irregularities.  Right Coronary Artery  Large caliber RCA with luminal irregularities. 50% stenosis ostial PDA.  Intervention   No interventions have been documented.  Right Heart   Right Heart Pressures    RHC Procedural Findings: Hemodynamics (mmHg) RA 16 RV 92/16 PA 93/51 mean 67 PCWP mean 27 LV 110/21 AO 115/75  Oxygen saturations: PA 56% AO 99%  Cardiac Output (Fick) 4.95  Cardiac Index (Fick) 2.65 PVR 8 WU PAPi 2.65    Objective:   Weight Range: 193 lb 8 oz (87.8 kg) Body mass index is 36.56 kg/m.   Vital Signs:   Temp:  [97.7 F (36.5 C)-98.7 F (37.1 C)] 97.7 F (36.5 C)  (06/06 0410) Pulse Rate:  [0-288] 95 (06/06 0410) Resp:  [0-23] 23 (06/06 0410) BP: (105-139)/(68-89) 110/72 (06/06 0410) SpO2:  [0 %-100 %] 96 % (06/06 0410) Weight:  [193 lb 8 oz (87.8 kg)] 193 lb 8 oz (87.8 kg) (06/06 0646) Last BM Date: 10/02/17  Weight change: Filed Weights   10/04/17 0500 10/05/17 0603 10/06/17 0646  Weight: 195 lb 8 oz (88.7 kg) 193 lb 6.4 oz (87.7 kg) 193 lb 8 oz (87.8 kg)   Intake/Output:   Intake/Output Summary (Last 24 hours) at 10/06/2017 0958 Last data filed at 10/06/2017 0411 Gross per 24 hour  Intake 480 ml  Output 1400 ml  Net -920 ml    Physical Exam    General: NAD  HEENT: Normal Neck: Supple. JVP 6-7. Carotids 2+ bilat; no bruits. No thyromegaly or nodule noted. Cor: PMI nondisplaced. RRR, No M/G/R noted Lungs: CTAB, normal effort. Abdomen: Soft, non-tender, non-distended, no HSM. No bruits or masses. +BS  Extremities: No cyanosis, clubbing, or rash. Trace ankle edema.  Neuro: Alert & orientedx3, cranial nerves grossly intact. moves all 4 extremities w/o difficulty. Affect pleasant   Telemetry   Sinus tach 100-110s, personally reviewed.   Labs    CBC Recent Labs    10/05/17 0500 10/06/17 0500  WBC 10.4 6.7  NEUTROABS 9.7* 5.7  HGB 8.6* 8.4*  HCT 33.4* 31.9*  MCV 78.8 78.6  PLT 164 742   Basic Metabolic Panel Recent Labs    10/05/17 0500 10/06/17 0500  NA 140 138  K 4.2 4.4  CL 86* 87*  CO2 43* 46*  GLUCOSE 139* 99  BUN 23* 18  CREATININE 1.46* 1.39*  CALCIUM 9.0 9.0   Liver Function Tests No results for input(s): AST, ALT, ALKPHOS, BILITOT, PROT, ALBUMIN in the last 72 hours. No results for input(s): LIPASE, AMYLASE in the last 72 hours. Cardiac Enzymes No results for input(s): CKTOTAL, CKMB, CKMBINDEX, TROPONINI in the last 72 hours.  BNP: BNP (last 3 results) Recent Labs    07/30/17 0950 09/30/17 2317  BNP 290.8* 928.6*    ProBNP (last 3 results) No results for input(s): PROBNP in the last 8760  hours.   D-Dimer No results for input(s): DDIMER in the last 72 hours. Hemoglobin A1C No results for input(s): HGBA1C in the last 72 hours. Fasting Lipid Panel No results for input(s): CHOL, HDL, LDLCALC, TRIG, CHOLHDL, LDLDIRECT in the last 72 hours. Thyroid Function Tests No results for input(s): TSH, T4TOTAL, T3FREE, THYROIDAB in the last 72 hours.  Invalid input(s): FREET3  Other results:   Imaging    No results found.   Medications:     Scheduled Medications: . allopurinol  300 mg Oral Q supper  . atorvastatin  40 mg Oral q1800  . fesoterodine  8 mg Oral Q supper  . furosemide  80 mg Intravenous BID  . heparin  5,000 Units Subcutaneous Q8H  . insulin aspart  0-9 Units Subcutaneous TID WC  . midodrine  10 mg Oral TID WC  . multivitamin with minerals  1 tablet Oral Q breakfast  . oxybutynin  5 mg Oral BID WC  . pantoprazole  40 mg Oral BID WC  . potassium chloride  40 mEq Oral BID  . sertraline  100 mg Oral Q breakfast  . sildenafil  20 mg Oral TID  . sodium chloride flush  3 mL Intravenous Q12H  . sodium chloride flush  3 mL Intravenous Q12H    Infusions: . sodium chloride    . sodium chloride      PRN Medications: sodium chloride, sodium chloride, acetaminophen, docusate sodium, ondansetron (ZOFRAN) IV, ondansetron, sodium chloride flush, sodium chloride flush, sodium chloride flush   Assessment/Plan   1. RV failure/pulmonary HTN:  Suspect the patient has severe RV failure in the setting of severe pulmonary hypertension. Echo showed flattened septum and severe pulmonary hypertension. On CT chest RV and PA were dilated. I strongly suspect group 3 PH from chronic hypoxemia, probably OHS/OSA.  He has not had a sleep study to diagnosis OSA but it is strongly suspected.  V/Q scan in 2016 did not suggest chronic PE.  RHC showed severely elevated PA pressure yesterday with preserved PAPi and elevated RA pressure and PCWP.  Cannot rule out group 1 PH contribution  with markedly high PA pressure.  - Will try CPAP with nasal pillows.   - Continue Lasix 80 mg IV bid today with ongoing volume overload.  - With further diuresis yesterday, will carefully start sildenafil 20 mg tid today.  - Keep oxygen saturation up with nasal cannula.  - Continue midodrine 10 mg tid to maintain BP, suspect hypotension is due to RV failure..  2. Chronic hypoxemic respiratory failure: As above suspect OHS/OSA.  High resolution chest CT was limited by pulmonary edema but did not appear to show interstitial lung disease.  PCCM agrees. - CCM has  seen and ordered CPAP QHS, will have him try it with nasal pillows tonight. Recommend outpatient PFTs.  - HFNC with flow up to 15 with napping, stable on 6 lpm while awake.  3. CKD: Stage 3.  Creatinine lower today. Continue diuresis.  4. Cholelithiasis: Abdomen is not tender today and he has been eating meals.  Has had symptomatic cholelithiasis and cholecystectomy recommended. RUQ [place] 6/1, cholelithiasis with no evidence for active cholecystitis.    - Will eventually need gallbladder addressed but not urgent at this time.  5. Anemia: Chronic, with Fe deficiency. He had a dose of IV Fe.  Most recently, had UGIB in 3/19 with EGD showing no definite cause for bleeding (other than gastritis). Occult stool positive. EGD 10/04/17 cancelled due to hypoxia. Hemoglobin is stable currently.  - Continue BID PPI.  - Will transfuse if Hgb < 7.0. No definite bleeding.  6. Developmental delay - Mother at bedside to assist communication. - No change to current plan.    7. Undefined muscular dystrophy: can walk in hall with PT.  8. CAD: Strong family history of premature coronary disease.  He had extensive coronary calcification on CT chest this admission.  He has epigastric discomfort that has been attributed to symptomatic cholelithiasis, but given extensive coronary calcification, was concerned it could actually be angina. Coronary angiography yesterday  showed nonobstructive disease.  - Continue statin.  - No ASA yet with low Hgb and GI bleed.   Length of Stay: 6  Loralie Champagne, MD  10/06/2017, 9:58 AM  Advanced Heart Failure Team Pager [phone] (M-F; 7a - 4p)  Please contact [facility] for night-coverage after hours (4p -7a ) and weekends on [email or web address]  Patient seen with PA, agree with the above note.  Cath as below.  Will restart IV Lasix tonight.  When filling pressures are lower, likely tomorrow, will start sildenafil 20 mg tid.  Possible mixed group 1 and group 3 PH (OHS/OSA and ?group 1 component).       Loralie Champagne 10/06/2017 9:58 AM

## 2017-10-06 NOTE — Progress Notes (Signed)
Per insurance check on [employer] (sildenafil)  # 5. S/W ROBERT @ [employer] # [phone] OPT- 3   PATIENT:  [place]   1. SILDENAFIL 20 MF  3 X DAILY  COVER- YES  CO-PAY- $ 3.40  TIER- 2 DRUG  PRIOR APPROVAL- NO    2. REVATIO  COVER- NONE FORMULARY  PRIOR APPROVAL- YES  # [id number]    PREFERRED PHARMACY : YES [facility]

## 2017-10-07 LAB — COOXEMETRY PANEL
CARBOXYHEMOGLOBIN: 2.2 % — AB (ref 0.5–1.5)
Methemoglobin: 1.7 % — ABNORMAL HIGH (ref 0.0–1.5)
O2 SAT: 77.4 %
Total hemoglobin: 8.1 g/dL — ABNORMAL LOW (ref 12.0–16.0)

## 2017-10-07 LAB — GLUCOSE, CAPILLARY
GLUCOSE-CAPILLARY: 113 mg/dL — AB (ref 65–99)
GLUCOSE-CAPILLARY: 174 mg/dL — AB (ref 65–99)
Glucose-Capillary: 180 mg/dL — ABNORMAL HIGH (ref 65–99)
Glucose-Capillary: 140 mg/dL — ABNORMAL HIGH (ref 65–99)

## 2017-10-07 LAB — BASIC METABOLIC PANEL
Anion gap: 7 (ref 5–15)
BUN: 26 mg/dL — ABNORMAL HIGH (ref 6–20)
CHLORIDE: 89 mmol/L — AB (ref 101–111)
CO2: 46 mmol/L — ABNORMAL HIGH (ref 22–32)
CREATININE: 1.66 mg/dL — AB (ref 0.61–1.24)
Calcium: 8.8 mg/dL — ABNORMAL LOW (ref 8.9–10.3)
GFR calc non Af Amer: 45 mL/min — ABNORMAL LOW (ref >60)
GFR, EST AFRICAN AMERICAN: 52 mL/min — AB (ref >60)
Glucose, Bld: 105 mg/dL — ABNORMAL HIGH (ref 65–99)
Potassium: 4.4 mmol/L (ref 3.5–5.1)
SODIUM: 142 mmol/L (ref 135–145)

## 2017-10-07 MED ORDER — MAGNESIUM HYDROXIDE 400 MG/5ML PO SUSP
30.0000 mL | Freq: Every day | ORAL | Status: DC | PRN
  Administered 2017-10-07 – 2017-10-08 (×2): 30 mL via ORAL
  Filled 2017-10-07 (×2): qty 30

## 2017-10-07 MED ORDER — SILDENAFIL CITRATE 20 MG PO TABS
40.0000 mg | ORAL_TABLET | Freq: Three times a day (TID) | ORAL | Status: DC
  Administered 2017-10-07 – 2017-10-10 (×11): 40 mg via ORAL
  Filled 2017-10-07 (×12): qty 2

## 2017-10-07 NOTE — Progress Notes (Signed)
Patient father stated he doesn't feel like patient will wear nasal pillow CPAP. Explained that is anything changes to just have RN call Rt and we would come and place him on the CPAP

## 2017-10-07 NOTE — Progress Notes (Signed)
Occupational Therapy Treatment Patient Details Name: Tony Jones MRN: [id number] DOB: 1960/10/16 Today's Date: 10/07/2017    History of present illness Pt is a 57 y.o. male with complex history developmental delay, Muscular dystrophy, chronic hypoxia with witnessed apnea (would not be tolerant of CPAP so uses O2), Barrett's esophagus, anemia, anxiety, cholelithasis, nephrolithiasis, DM, dyslipidemia, GERD, HTN, pulmonary HTN and right heart failure who was admitted directly to the hospital for further evaluation of recent malaise and failure to thrive. He does very little walking, but he has been SOB with minimal exertion.   OT comments  Pt progressing towards OT goals. Pt was able to tolerate standing exercise at sink level after toilet transfer and sink level grooming. Pt enjoyed writing in shaving cream on mirror (playing tic-tac-toe), and working on dynamic balance, UE strengthening, and strategies for the game. AT this time, OT recommending SNF level care post-acute as Pt will benefit from the increased frequency of therapies available instead of the original [place]. Pt encouraged to continue working and moving with RN staff in between therapy sessions.    Follow Up Recommendations  SNF;Supervision/Assistance - 24 hour    Equipment Recommendations  None recommended by OT    Recommendations for Other Services      Precautions / Restrictions Precautions Precautions: Other (comment) Precaution Comments: has low SpO2 but not syptomatic Restrictions Weight Bearing Restrictions: No       Mobility Bed Mobility               General bed mobility comments: Sitting in chair  Transfers Overall transfer level: Needs assistance Equipment used: 1 person hand held assist Transfers: Sit to/from Stand Sit to Stand: Min guard         General transfer comment: Assist for safety and line management    Balance Overall balance assessment: Mild deficits observed, not formally tested                                          ADL either performed or assessed with clinical judgement   ADL Overall ADL's : Needs assistance/impaired     Grooming: Wash/dry hands;Min guard;Cueing for sequencing;Standing Grooming Details (indicate cue type and reason): mod vc for sequencing, min guard for standing balance                 Toilet Transfer: Minimal assistance;Ambulation Toilet Transfer Details (indicate cue type and reason): HHA                 Vision       Perception     Praxis      Cognition Arousal/Alertness: Awake/alert Behavior During Therapy: WFL for tasks assessed/performed Overall Cognitive Status: History of cognitive impairments - at baseline                                          Exercises Exercises: Other exercises Other Exercises Other Exercises: Took patient to sink, and put shaving cream on mirror, played tic-tac toe, drew pictures/letters and exercised not only BUE but standing balance for functional tasks for approx 15 imn   Shoulder Instructions       General Comments Pt on 10 L high flow O2 throughout session    Pertinent Vitals/ Pain       Pain Assessment:  No/denies pain Faces Pain Scale: No hurt Pain Intervention(s): Monitored during session  Home Living                                          Prior Functioning/Environment              Frequency  Min 2X/week        Progress Toward Goals  OT Goals(current goals can now be found in the care plan section)  Progress towards OT goals: Progressing toward goals  Acute Rehab OT Goals Patient Stated Goal: to go home OT Goal Formulation: With patient/family Time For Goal Achievement: 10/16/17 Potential to Achieve Goals: Good  Plan Discharge plan needs to be updated    Co-evaluation                 AM-PAC PT "6 Clicks" Daily Activity     Outcome Measure   Help from another person eating meals?:  None Help from another person taking care of personal grooming?: A Lot Help from another person toileting, which includes using toliet, bedpan, or urinal?: A Lot Help from another person bathing (including washing, rinsing, drying)?: A Lot Help from another person to put on and taking off regular upper body clothing?: None Help from another person to put on and taking off regular lower body clothing?: A Lot 6 Click Score: 16    End of Session Equipment Utilized During Treatment: Oxygen(10 L high flow)  OT Visit Diagnosis: History of falling (Z91.81);Muscle weakness (generalized) (M62.81);Other (comment)   Activity Tolerance Patient tolerated treatment well   Patient Left in chair;with call bell/phone within reach;with family/visitor present   Nurse Communication Mobility status        Time: 1520-1540 OT Time Calculation (min): 20 min  Charges: OT General Charges $OT Visit: 1 Visit OT Treatments $Therapeutic Activity: 8-22 mins Hulda Humphrey OTR/L [place] 10/07/2017, 5:16 PM

## 2017-10-07 NOTE — Progress Notes (Addendum)
Patient ID: Tony Jones, male   DOB: March 08, 1961, 57 y.o.   MRN: [id number]     Advanced Heart Failure Rounding Note  PCP-Cardiologist: No primary care provider on file.   Subjective:    EGD 10/04/17 cancelled due to hypoxia. Hgb remains stable this morning without overt bleeding.   CVP 3-4. Weight stable.   Woken up at 0300 for PICC line dressing change, so sleeping on and off this am. Denies SOB. Has still not tried CPAP. Continues to require HFNC.   High res Chest CT 10/01/17: Limited evaluation for ILD because of presence of pulmonary edema. The RV and PA were dilated.  There was extensive coronary calcification.   Abd [place] 10/02/17: Cholelithiasis without evidence for cholecystitis. Hepatic steatosis.  Small ascites.   RHC/LHC (6/5):  Coronary Findings  Diagnostic  Dominance: Right  Left Main  No left main, separate ostia LAD and LCx.  Left Anterior Descending  Separate ostium of LAD. LAD has luminal irregularities.  Left Circumflex  Separate ostium of LCx. Luminal irregularities.  Right Coronary Artery  Large caliber RCA with luminal irregularities. 50% stenosis ostial PDA.  Intervention   No interventions have been documented.  Right Heart   Right Heart Pressures    RHC Procedural Findings: Hemodynamics (mmHg) RA 16 RV 92/16 PA 93/51 mean 67 PCWP mean 27 LV 110/21 AO 115/75  Oxygen saturations: PA 56% AO 99%  Cardiac Output (Fick) 4.95  Cardiac Index (Fick) 2.65 PVR 8 WU PAPi 2.65    Objective:   Weight Range: 193 lb 5.5 oz (87.7 kg) Body mass index is 36.53 kg/m.   Vital Signs:   Temp:  [97 F (36.1 C)-97.9 F (36.6 C)] 97 F (36.1 C) (06/07 0455) Pulse Rate:  [94-100] 98 (06/07 0819) Resp:  [15-20] 19 (06/07 0819) BP: (91-105)/(58-78) 105/68 (06/07 0819) SpO2:  [92 %-100 %] 100 % (06/07 0940) Weight:  [193 lb 5.5 oz (87.7 kg)] 193 lb 5.5 oz (87.7 kg) (06/07 0500) Last BM Date: 10/06/17  Weight change: Filed Weights   10/05/17 0603 10/06/17  0646 10/07/17 0500  Weight: 193 lb 6.4 oz (87.7 kg) 193 lb 8 oz (87.8 kg) 193 lb 5.5 oz (87.7 kg)   Intake/Output:   Intake/Output Summary (Last 24 hours) at 10/07/2017 0959 Last data filed at 10/07/2017 0300 Gross per 24 hour  Intake 760 ml  Output 1725 ml  Net -965 ml    Physical Exam   General: NAD  HEENT: Normal Neck: Supple. JVP not elevated. Carotids 2+ bilat; no bruits. No thyromegaly or nodule noted. Cor: PMI nondisplaced. RRR, No M/G/R noted Lungs: CTAB, normal effort. Abdomen: Soft, non-tender, non-distended, no HSM. No bruits or masses. +BS  Extremities: No cyanosis, clubbing, or rash. Trace ankle edema.  Neuro: Alert & orientedx3, cranial nerves grossly intact. moves all 4 extremities w/o difficulty. Affect pleasant   Telemetry   NSR/Sinus tach - 90-100s, personally reviewed.   Labs    CBC Recent Labs    10/05/17 0500 10/06/17 0500  WBC 10.4 6.7  NEUTROABS 9.7* 5.7  HGB 8.6* 8.4*  HCT 33.4* 31.9*  MCV 78.8 78.6  PLT 164 564   Basic Metabolic Panel Recent Labs    10/06/17 0500 10/07/17 0245  NA 138 142  K 4.4 4.4  CL 87* 89*  CO2 46* 46*  GLUCOSE 99 105*  BUN 18 26*  CREATININE 1.39* 1.66*  CALCIUM 9.0 8.8*   Liver Function Tests No results for input(s): AST, ALT, ALKPHOS, BILITOT, PROT,  ALBUMIN in the last 72 hours. No results for input(s): LIPASE, AMYLASE in the last 72 hours. Cardiac Enzymes No results for input(s): CKTOTAL, CKMB, CKMBINDEX, TROPONINI in the last 72 hours.  BNP: BNP (last 3 results) Recent Labs    07/30/17 0950 09/30/17 2317  BNP 290.8* 928.6*    ProBNP (last 3 results) No results for input(s): PROBNP in the last 8760 hours.   D-Dimer No results for input(s): DDIMER in the last 72 hours. Hemoglobin A1C No results for input(s): HGBA1C in the last 72 hours. Fasting Lipid Panel No results for input(s): CHOL, HDL, LDLCALC, TRIG, CHOLHDL, LDLDIRECT in the last 72 hours. Thyroid Function Tests No results for  input(s): TSH, T4TOTAL, T3FREE, THYROIDAB in the last 72 hours.  Invalid input(s): FREET3  Other results:   Imaging    No results found.   Medications:     Scheduled Medications: . allopurinol  300 mg Oral Q supper  . atorvastatin  40 mg Oral q1800  . fesoterodine  8 mg Oral Q supper  . furosemide  80 mg Intravenous BID  . heparin  5,000 Units Subcutaneous Q8H  . insulin aspart  0-9 Units Subcutaneous TID WC  . midodrine  10 mg Oral TID WC  . multivitamin with minerals  1 tablet Oral Q breakfast  . oxybutynin  5 mg Oral BID WC  . pantoprazole  40 mg Oral BID WC  . potassium chloride  40 mEq Oral BID  . sertraline  100 mg Oral Q breakfast  . sildenafil  20 mg Oral TID  . sodium chloride flush  3 mL Intravenous Q12H  . sodium chloride flush  3 mL Intravenous Q12H    Infusions: . sodium chloride    . sodium chloride      PRN Medications: sodium chloride, sodium chloride, acetaminophen, docusate sodium, ondansetron (ZOFRAN) IV, ondansetron, sodium chloride flush, sodium chloride flush, sodium chloride flush   Assessment/Plan   1. RV failure/pulmonary HTN:  Suspect the patient has severe RV failure in the setting of severe pulmonary hypertension. Echo showed flattened septum and severe pulmonary hypertension. On CT chest RV and PA were dilated. I strongly suspect group 3 PH from chronic hypoxemia, probably OHS/OSA.  He has not had a sleep study to diagnosis OSA but it is strongly suspected.  V/Q scan in 2016 did not suggest chronic PE.  RHC showed severely elevated PA pressure yesterday with preserved PAPi and elevated RA pressure and PCWP.  Cannot rule out group 1 PH contribution with markedly high PA pressure.  - Continue to encourage to try CPAP with nasal pillows.   - Hold lasix now with AKI.  - Continue sildenafil 20 mg TID.  - Keep oxygen saturation up with nasal cannula/HFNC.   - Continue midodrine 10 mg tid to maintain BP, suspect hypotension is due to RV  failure..  2. Chronic hypoxemic respiratory failure: As above suspect OHS/OSA.  High resolution chest CT was limited by pulmonary edema but did not appear to show interstitial lung disease.  PCCM agrees. - CCM has seen and ordered CPAP QHS, will have him try it with nasal pillows tonight. Recommend outpatient PFTs.  - HFNC with flow up to 15 with napping, stable on 6 lpm while awake.  - Continue to encourage to try CPAP.  3. CKD: Stage 3.   - Creatinine 1.3 -> 1.6.  4. Cholelithiasis: Abdomen is not tender today and he has been eating meals.  Has had symptomatic cholelithiasis and cholecystectomy recommended. RUQ  [place] 6/1, cholelithiasis with no evidence for active cholecystitis.    - Will eventually need gallbladder addressed but not urgent at this time. No change.  5. Anemia: Chronic, with Fe deficiency. He had a dose of IV Fe.  Most recently, had UGIB in 3/19 with EGD showing no definite cause for bleeding (other than gastritis). Occult stool positive. EGD 10/04/17 cancelled due to hypoxia. Hemoglobin is stable currently.  - Continue BID PPI.  - No CBC today. No definite bleeding.  6. Developmental delay - Mother at bedside to assist communication. - No change to current plan.   7. Undefined muscular dystrophy: can walk in hall with PT.  8. CAD: Strong family history of premature coronary disease.  He had extensive coronary calcification on CT chest this admission.  He has epigastric discomfort that has been attributed to symptomatic cholelithiasis, but given extensive coronary calcification, was concerned it could actually be angina. Coronary angiography 10/05/17 showed nonobstructive disease.  - No s/s of ischemia.    - Continue statin.  - No ASA yet with low Hgb and GI bleed.   Length of Stay: [place], [place]  10/07/2017, 9:59 AM  Advanced Heart Failure Team Pager [phone] (M-F; 7a - 4p)  Please contact [facility] for night-coverage after hours (4p -7a ) and weekends on  [email or web address]  Patient seen with PA, agree with the above note.  Patient was unable to tolerate CPAP.  CVP 3-4 today with creatinine up to 1.66.  - Stop Lasix today, if creatinine stable start torsemide 60 mg daily tomorrow.  - Will try CPAP one more time with nasal pillows.  - Possible home over weekend if adequate oxygen can be arranged through his home health (PT does not think he would qualify for SNF).   Loralie Champagne 10/07/2017 5:59 PM

## 2017-10-07 NOTE — Progress Notes (Signed)
Spoke with Janett Billow RT and pt's mother regarding CPAP. Pt's mom requests that RT teach pt about CPAP during the day when pt is more awake. Plan for Jessica RT to visit pt after his lunch to discuss CPAP.   Fritz Pickerel, RN

## 2017-10-07 NOTE — Progress Notes (Signed)
RT attempted to place patient on CPAP on 8 cmH20 with 10L bleed in.  Pt wore for about 60 seconds and said he couldn't do it.  After a few minutes CPAP decreased to 5 cm H20. And same outcome after about 60 seconds he said he couldn't do it.  Pt placed back on 12L HFNC and tolerating well at this time.  RT will continue to monitor.

## 2017-10-07 NOTE — Progress Notes (Signed)
Physical Therapy Treatment Patient Details Name: Tony Jones MRN: [id number] DOB: 1960-12-26 Today's Date: 10/07/2017    History of Present Illness Pt is a 57 y.o. male with complex history developmental delay, Muscular dystrophy, chronic hypoxia with witnessed apnea (would not be tolerant of CPAP so uses O2), Barrett's esophagus, anemia, anxiety, cholelithasis, nephrolithiasis, DM, dyslipidemia, GERD, HTN, pulmonary HTN and right heart failure who was admitted directly to the hospital for further evaluation of recent malaise and failure to thrive. He does very little walking, but he has been SOB with minimal exertion.    PT Comments    Pt making steady progress but continues to require high levels of supplemental O2. Mother expresses concern over pt's general deconditioning over the past couple of months and would like him to be stronger and more able to participate in community mobility and activities. Recommend ST-SNF in order to achieve this goal.    Follow Up Recommendations  SNF     Equipment Recommendations  None recommended by PT    Recommendations for Other Services       Precautions / Restrictions Precautions Precautions: Other (comment) Precaution Comments: has low SpO2 but not syptomatic Restrictions Weight Bearing Restrictions: No    Mobility  Bed Mobility               General bed mobility comments: Sitting in chair  Transfers Overall transfer level: Needs assistance Equipment used: 4-wheeled walker Transfers: Sit to/from Stand Sit to Stand: Min guard         General transfer comment: Assist for safety and line management  Ambulation/Gait Ambulation/Gait assistance: Min guard Ambulation Distance (Feet): 250 Feet Assistive device: 4-wheeled walker Gait Pattern/deviations: Step-through pattern;Decreased stride length Gait velocity: decr Gait velocity interpretation: 1.31 - 2.62 ft/sec, indicative of limited community ambulator General Gait Details:  Assist for safety and line management. Amb on 10L of O2 with SpO2 86% at end of amb. Recovered to 92% in ~ 2 minutes   Stairs             Wheelchair Mobility    Modified Rankin (Stroke Patients Only)       Balance Overall balance assessment: Mild deficits observed, not formally tested                                          Cognition Arousal/Alertness: Awake/alert Behavior During Therapy: WFL for tasks assessed/performed Overall Cognitive Status: History of cognitive impairments - at baseline                                        Exercises      General Comments        Pertinent Vitals/Pain Pain Assessment: Faces Faces Pain Scale: No hurt    Home Living                      Prior Function            PT Goals (current goals can now be found in the care plan section) Progress towards PT goals: Progressing toward goals    Frequency    Min 3X/week      PT Plan Discharge plan needs to be updated    Co-evaluation  AM-PAC PT "6 Clicks" Daily Activity  Outcome Measure  Difficulty turning over in bed (including adjusting bedclothes, sheets and blankets)?: A Little Difficulty moving from lying on back to sitting on the side of the bed? : A Little Difficulty sitting down on and standing up from a chair with arms (e.g., wheelchair, bedside commode, etc,.)?: A Little Help needed moving to and from a bed to chair (including a wheelchair)?: A Little Help needed walking in hospital room?: A Little Help needed climbing 3-5 steps with a railing? : A Little 6 Click Score: 18    End of Session Equipment Utilized During Treatment: Oxygen Activity Tolerance: Patient tolerated treatment well Patient left: in chair;with family/visitor present Nurse Communication: Mobility status;Other (comment)(SpO2 levels) PT Visit Diagnosis: Other abnormalities of gait and mobility (R26.89);Muscle weakness  (generalized) (M62.81)     Time: 5697-9480 PT Time Calculation (min) (ACUTE ONLY): 17 min  Charges:  $Gait Training: 8-22 mins                    G Codes:       [place] PT [place] 10/07/2017, 4:21 PM

## 2017-10-07 NOTE — Care Management Note (Signed)
Case Management Note Marvetta Gibbons RN, BSN Unit 4E-Case Manager [phone]  Patient Details  Name: Tony Jones MRN: [id number] Date of Birth: Jun 12, 1960  Subjective/Objective:  Pt admitted with acute on chronic respiratory failure likely multifactorial including pulmonary hypertension, pulm edema, pleural effusion and possible OSA/OHS, CHF, cholelithiasis- may need surgery involvement when stabilized.                 Action/Plan: PTA pt lived at home with mom, hx of mental delay. Has home 02 at 2L baseline. Per PT/OT evals recommendations for [facility] services- CM to follow for transition of care needs when stable- will need orders for [facility] prior to discharge.   Expected Discharge Date:                  Expected Discharge Plan:  Skilled Nursing Facility  In-House Referral:  Clinical Social Work  Discharge planning Services  CM Consult  Post Acute Care Choice:  Home Health Choice offered to:  Parent  DME Arranged:    DME Agency:     HH Arranged:    [place] Agency:     Status of Service:  In process, will continue to follow  If discussed at Long Length of Stay Meetings, dates discussed:    Discharge Disposition:   Additional Comments:  10/07/17- 1630- Marvetta Gibbons RN, CM- PT following pt for transition needs- initial recommendation for [facility] however mom concerned about pt needed more consistent therapy to try to get pt more active. Recommendation now for STSNF- not sure insurance will approve however will have CSW assess and see if pt can get approved per insurance for STSNF- mom understands that if insurance does not approve then pt will need to return home with [facility] services- spoke with both mom and brother at bedside who understand the process and want first to see about STSNF vs home with [facility]. Pt has home 02 with [facility]- [phone]- baseline is 2-4L with max of 5L- mom states she called Ivin Booty at [facility] and they told her that they can provide equipment that can go up to 10 L.- if  pt does go home with [facility] will need to update home 02 orders and equipment for home- CM to continue to follow for transition of care needs.   Dawayne Patricia, RN 10/07/2017, 4:36 PM

## 2017-10-08 DIAGNOSIS — I2510 Atherosclerotic heart disease of native coronary artery without angina pectoris: Secondary | ICD-10-CM

## 2017-10-08 DIAGNOSIS — I509 Heart failure, unspecified: Secondary | ICD-10-CM

## 2017-10-08 LAB — CBC WITH DIFFERENTIAL/PLATELET
BASOS ABS: 0 10*3/uL (ref 0.0–0.1)
Basophils Relative: 0 %
EOS ABS: 0.2 10*3/uL (ref 0.0–0.7)
Eosinophils Relative: 3 %
HEMATOCRIT: 30.9 % — AB (ref 39.0–52.0)
Hemoglobin: 8.1 g/dL — ABNORMAL LOW (ref 13.0–17.0)
LYMPHS ABS: 0.5 10*3/uL — AB (ref 0.7–4.0)
Lymphocytes Relative: 8 %
MCH: 21.4 pg — ABNORMAL LOW (ref 26.0–34.0)
MCHC: 26.2 g/dL — ABNORMAL LOW (ref 30.0–36.0)
MCV: 81.7 fL (ref 78.0–100.0)
MONO ABS: 0.2 10*3/uL (ref 0.1–1.0)
MONOS PCT: 3 %
Neutro Abs: 5.6 10*3/uL (ref 1.7–7.7)
Neutrophils Relative %: 86 %
PLATELETS: 145 10*3/uL — AB (ref 150–400)
RBC: 3.78 MIL/uL — AB (ref 4.22–5.81)
RDW: 24 % — AB (ref 11.5–15.5)
WBC: 6.5 10*3/uL (ref 4.0–10.5)

## 2017-10-08 LAB — GLUCOSE, CAPILLARY
GLUCOSE-CAPILLARY: 118 mg/dL — AB (ref 65–99)
GLUCOSE-CAPILLARY: 132 mg/dL — AB (ref 65–99)
GLUCOSE-CAPILLARY: 123 mg/dL — AB (ref 65–99)
Glucose-Capillary: 165 mg/dL — ABNORMAL HIGH (ref 65–99)

## 2017-10-08 LAB — URINALYSIS, ROUTINE W REFLEX MICROSCOPIC
Bilirubin Urine: NEGATIVE
Glucose, UA: NEGATIVE mg/dL
HGB URINE DIPSTICK: NEGATIVE
Ketones, ur: NEGATIVE mg/dL
Leukocytes, UA: NEGATIVE
Nitrite: NEGATIVE
PH: 7 (ref 5.0–8.0)
Protein, ur: NEGATIVE mg/dL
Specific Gravity, Urine: 1.012 (ref 1.005–1.030)

## 2017-10-08 LAB — BASIC METABOLIC PANEL
Anion gap: 8 (ref 5–15)
BUN: 26 mg/dL — AB (ref 6–20)
CALCIUM: 8.7 mg/dL — AB (ref 8.9–10.3)
CO2: 44 mmol/L — ABNORMAL HIGH (ref 22–32)
Chloride: 89 mmol/L — ABNORMAL LOW (ref 101–111)
Creatinine, Ser: 1.56 mg/dL — ABNORMAL HIGH (ref 0.61–1.24)
GFR calc Af Amer: 56 mL/min — ABNORMAL LOW (ref >60)
GFR, EST NON AFRICAN AMERICAN: 48 mL/min — AB (ref >60)
Glucose, Bld: 120 mg/dL — ABNORMAL HIGH (ref 65–99)
POTASSIUM: 4.7 mmol/L (ref 3.5–5.1)
SODIUM: 141 mmol/L (ref 135–145)

## 2017-10-08 LAB — COOXEMETRY PANEL
Carboxyhemoglobin: 2.6 % — ABNORMAL HIGH (ref 0.5–1.5)
METHEMOGLOBIN: 1.4 % (ref 0.0–1.5)
O2 Saturation: 99.8 %
TOTAL HEMOGLOBIN: 8.4 g/dL — AB (ref 12.0–16.0)

## 2017-10-08 MED ORDER — TORSEMIDE 20 MG PO TABS
60.0000 mg | ORAL_TABLET | Freq: Every day | ORAL | Status: DC
  Administered 2017-10-08 – 2017-10-12 (×5): 60 mg via ORAL
  Filled 2017-10-08 (×5): qty 3

## 2017-10-08 MED ORDER — LIDOCAINE HCL URETHRAL/MUCOSAL 2 % EX GEL
1.0000 "application " | Freq: Once | CUTANEOUS | Status: DC
  Filled 2017-10-08: qty 5

## 2017-10-08 NOTE — Progress Notes (Signed)
Pt unable to void since 0700 I/O cath. Bladder scan showed 388. Spoke with Ignacia Bayley NP - given verbal order for indwelling foley, UA, and urine culture.   Fritz Pickerel, RN

## 2017-10-08 NOTE — Progress Notes (Signed)
Pt had I/O cath this morning at 0700 for acute urinary retention. Pt has not yet voided, feels as if he needs to, but unable. Currently sitting up in chair with lunch. Spoke with pt's mother who is agreeable to current plan. Plan is to transfer to bed after lunch for bladder scan.   Fritz Pickerel, RN

## 2017-10-08 NOTE — Progress Notes (Signed)
Foley insertion attempted with 16 french. Pt unable to tolerate, yelling "ouch", 2 persons unable to pass catheter past tip of penis. Paged cardiology.   Fritz Pickerel, RN

## 2017-10-08 NOTE — Social Work (Signed)
CSW aware pt family would like the consistency of SNF placement due to deconditioning.  CSW has submitted PASSR, under manual review due to pt hx and needs.  Insurance auth initiated through [employer].  [place], [place] Work [phone]

## 2017-10-08 NOTE — Progress Notes (Signed)
Received order for I/O cath with lidocaine jelly intra-uretheral from Tony Bayley NP. Pt able to void 300cc at 1600 prior to I/O cath.  Fritz Pickerel, RN

## 2017-10-08 NOTE — Progress Notes (Signed)
Progress Note  Patient Name: Tony Jones Date of Encounter: 10/08/2017  Primary Cardiologist: Ellyn Hack    Subjective   Breathing a little short    Inpatient Medications    Scheduled Meds: . allopurinol  300 mg Oral Q supper  . atorvastatin  40 mg Oral q1800  . fesoterodine  8 mg Oral Q supper  . heparin  5,000 Units Subcutaneous Q8H  . insulin aspart  0-9 Units Subcutaneous TID WC  . midodrine  10 mg Oral TID WC  . multivitamin with minerals  1 tablet Oral Q breakfast  . oxybutynin  5 mg Oral BID WC  . pantoprazole  40 mg Oral BID WC  . sertraline  100 mg Oral Q breakfast  . sildenafil  40 mg Oral TID  . sodium chloride flush  3 mL Intravenous Q12H  . sodium chloride flush  3 mL Intravenous Q12H   Continuous Infusions: . sodium chloride    . sodium chloride     PRN Meds: sodium chloride, sodium chloride, acetaminophen, docusate sodium, magnesium hydroxide, ondansetron (ZOFRAN) IV, ondansetron, sodium chloride flush, sodium chloride flush, sodium chloride flush   Vital Signs    Vitals:   10/07/17 1926 10/08/17 0016 10/08/17 0519 10/08/17 0827  BP: 95/70 100/70 97/68 112/76  Pulse: 93 98 97 100  Resp: 15 15 20 11   Temp: 97.7 F (36.5 C) 97.6 F (36.4 C) 97.9 F (36.6 C) 97.6 F (36.4 C)  TempSrc: Oral Axillary Axillary Axillary  SpO2: 100% 98% 96% 98%  Weight:   87.8 kg (193 lb 9 oz)   Height:        Intake/Output Summary (Last 24 hours) at 10/08/2017 1129 Last data filed at 10/08/2017 8546 Gross per 24 hour  Intake 360 ml  Output 1850 ml  Net -1490 ml   Net neg 12.8 L   Filed Weights   10/06/17 0646 10/07/17 0500 10/08/17 0519  Weight: 87.8 kg (193 lb 8 oz) 87.7 kg (193 lb 5.5 oz) 87.8 kg (193 lb 9 oz)    Telemetry    SR   - Personally Reviewed  ECG      Physical Exam  Pt sitting in chair  Arms propped on pillow on bedside table   GEN: No acute distress.   Neck: Neck is full    Cardiac: RRR, no murmurs, rubs, or gallops.   Respiratory:  Clear to auscultation bilaterally. GI: Soft, nontender, non-distended  MS: 1+  edema; No deformity. Neuro:  Nonfocal  Psych: Normal affect   Labs    Chemistry Recent Labs  Lab 10/06/17 0500 10/07/17 0245 10/08/17 0657  NA 138 142 141  K 4.4 4.4 4.7  CL 87* 89* 89*  CO2 46* 46* 44*  GLUCOSE 99 105* 120*  BUN 18 26* 26*  CREATININE 1.39* 1.66* 1.56*  CALCIUM 9.0 8.8* 8.7*  GFRNONAA 55* 45* 48*  GFRAA >60 52* 56*  ANIONGAP 5 7 8      Hematology Recent Labs  Lab 10/05/17 0500 10/06/17 0500 10/08/17 0657  WBC 10.4 6.7 6.5  RBC 4.24 4.06* 3.78*  HGB 8.6* 8.4* 8.1*  HCT 33.4* 31.9* 30.9*  MCV 78.8 78.6 81.7  MCH 20.3* 20.7* 21.4*  MCHC 25.7* 26.3* 26.2*  RDW 21.8* 22.4* 24.0*  PLT 164 167 145*    Cardiac EnzymesNo results for input(s): TROPONINI in the last 168 hours. No results for input(s): TROPIPOC in the last 168 hours.   BNPNo results for input(s): BNP, PROBNP in the last  168 hours.   DDimer No results for input(s): DDIMER in the last 168 hours.   Radiology    No results found.  Cardiac Studies  R and L heart cath   Coronary Findings   Diagnostic  Dominance: Right  Left Main  No left main, separate ostia LAD and LCx.  Left Anterior Descending  Separate ostium of LAD. LAD has luminal irregularities.  Left Circumflex  Separate ostium of LCx. Luminal irregularities.  Right Coronary Artery  Large caliber RCA with luminal irregularities. 50% stenosis ostial PDA.     Right Heart   Right Heart Pressures RHC Procedural Findings: Hemodynamics (mmHg) RA 16 RV 92/16 PA 93/51 mean 67 PCWP mean 27 LV 110/21 AO 115/75  Oxygen saturations: PA 56% AO 99%  Cardiac Output (Fick) 4.95  Cardiac Index (Fick) 2.65 PVR 8 WU PAPi 2.65     Patient Profile     57 y.o. male  With severe pulmonary HTN/cor pulmonale    Assessment & Plan    1  Pulmonary HTN/ RV failure   Cath with elevate PAP   Will start torsemide today   Follow renal function     ON sildenafil 20 mg tid   On oxygen Did not tolerate CPAP   OK with HFNC 10 L    Sats went down to 70s transiently   COntinue midodrine to maintain BP  2  Pulmonary  CT limited but did nto appear to show interstitial lung dz CPAP initiated at night   High flow [place] when napping  WIll need outpt PFTs  3  CKD  Stage 3    4  GI  Hx symptomatic cholelithiasis.   GI bleed in March 2019  EGD with gastritis  EGD cancelled on 6/4 due to hypoxia Keep on PPI  Follow CC  5  CAD  Pt with extensive coronary calcifications.    Cath showed nonobstructive CAD   Keep on statin  No ASA       For questions or updates, please contact [place] Please consult [email or web address] for contact info under Cardiology/STEMI.      Signed, Dorris Carnes, MD  10/08/2017, 11:29 AM

## 2017-10-08 NOTE — NC FL2 (Signed)
[place] LEVEL OF CARE SCREENING TOOL     IDENTIFICATION  Patient Name: Tony Jones Birthdate: 11-21-1960 Sex: male Admission Date (Current Location): 09/30/2017  [facility] and [place] Number:  Herbalist and Address:  [facility], [place] [address], [place], [place] [address]      Provider Number: [id number]  Attending Physician Name and Address:  Leonie Man, MD  Relative Name and Phone Number:   Jaelyn Giovanni, mother    Current Level of Care: SNF Recommended Level of Care: [place] Prior Approval Number:    Date Approved/Denied:   PASRR Number:  pending  Discharge Plan: SNF    Current Diagnoses: Patient Active Problem List   Diagnosis Date Noted  . OSA (obstructive sleep apnea)   . RVF (right ventricular failure) ([place])   . Pulmonary hypertension ([place]) 09/30/2017  . Failure to thrive in adult 09/30/2017  . AKI (acute kidney injury) ([place]) 09/30/2017  . Anemia 09/30/2017  . Right heart failure ([place]) 09/30/2017  . Postural dizziness 08/29/2017  . Acute blood loss anemia 07/26/2017  . Hematemesis 07/24/2017  . Moderate obesity 09/17/2015  . Thrombocytopenia ([place]) 02/02/2015  . Chronic diastolic heart failure ([place]) 01/29/2015  . Diabetes mellitus type 2, controlled ([place]) 01/29/2015  . Acute diastolic heart failure ([place]) 09/26/2013  . Acute on chronic respiratory failure with hypoxemia ([place]) 09/26/2013  . Acute CHF ([place]) 09/26/2013  . Mental disorder   . Diabetes mellitus type 2, uncontrolled ([place])   . Hypertension   . Dyslipidemia     Orientation RESPIRATION BLADDER Height & Weight     Self, Time, Place  O2 Continent Weight: 193 lb 9 oz (87.8 kg) Height:  5\' 1"  (154.9 cm)  BEHAVIORAL SYMPTOMS/MOOD NEUROLOGICAL BOWEL NUTRITION STATUS      Continent Diet(see discharge summary)  AMBULATORY STATUS COMMUNICATION OF NEEDS Skin   Limited Assist Verbally Normal                       Personal  Care Assistance Level of Assistance  Feeding, Bathing, Dressing Bathing Assistance: Limited assistance Feeding assistance: Independent Dressing Assistance: Limited assistance     Functional Limitations Info  Sight, Hearing, Speech Sight Info: Adequate Hearing Info: Adequate Speech Info: Adequate    SPECIAL CARE FACTORS FREQUENCY  PT (By licensed PT), OT (By licensed OT)     PT Frequency: 5x week OT Frequency: 5x week            Contractures Contractures Info: Not present    Additional Factors Info  Code Status, Allergies, Insulin Sliding Scale, Psychotropic Code Status Info: DNR Allergies Info: Codeine Psychotropic Info: sertraline (ZOLOFT) tablet 100 mg daily with breakfast Insulin Sliding Scale Info: insulin aspart (novoLOG) injection 0-9 Units with meals 3x daily       Current Medications (10/08/2017):  This is the current hospital active medication list Current Facility-Administered Medications  Medication Dose Route Frequency Provider Last Rate Last Dose  . 0.9 %  sodium chloride infusion  250 mL Intravenous PRN [name], MD      . 0.9 %  sodium chloride infusion  250 mL Intravenous PRN [name], MD      . acetaminophen (TYLENOL) tablet 650 mg  650 mg Oral Q4H PRN [name], MD   650 mg at 10/08/17 0019  . allopurinol (ZYLOPRIM) tablet 300 mg  300 mg Oral Q supper [name], MD  300 mg at 10/07/17 1707  . atorvastatin (LIPITOR) tablet 40 mg  40 mg Oral q1800 [name], MD   40 mg at 10/07/17 1706  . docusate sodium (COLACE) capsule 100 mg  100 mg Oral Daily PRN [name], MD   100 mg at 10/07/17 1806  . fesoterodine (TOVIAZ) tablet 8 mg  8 mg Oral Q supper [name], MD   8 mg at 10/07/17 1706  . heparin injection 5,000 Units  5,000 Units Subcutaneous Q8H [name], MD   5,000 Units at 10/08/17 [phone]  . insulin aspart (novoLOG) injection 0-9 Units  0-9 Units Subcutaneous TID WC [name], MD   2 Units at  10/08/17 1242  . lidocaine (XYLOCAINE) 2 % jelly 1 application  1 application Urethral Once Theora Gianotti, NP      . magnesium hydroxide (MILK OF MAGNESIA) suspension 30 mL  30 mL Oral Daily PRN Leonie Man, MD   30 mL at 10/07/17 1909  . midodrine (PROAMATINE) tablet 10 mg  10 mg Oral TID WC [name], MD   10 mg at 10/08/17 1056  . multivitamin with minerals tablet 1 tablet  1 tablet Oral Q breakfast [name], MD   1 tablet at 10/08/17 [phone]  . ondansetron (ZOFRAN) injection 4 mg  4 mg Intravenous Q6H PRN [name], MD      . ondansetron (ZOFRAN-ODT) disintegrating tablet 4 mg  4 mg Oral Q8H PRN [name], MD      . oxybutynin [facility]) tablet 5 mg  5 mg Oral BID WC [name], MD   5 mg at 10/08/17 8250  . pantoprazole (PROTONIX) EC tablet 40 mg  40 mg Oral BID WC [name], MD   40 mg at 10/08/17 0370  . sertraline (ZOLOFT) tablet 100 mg  100 mg Oral Q breakfast [name], MD   100 mg at 10/08/17 4888  . sildenafil (REVATIO) tablet 40 mg  40 mg Oral TID [name], MD   40 mg at 10/08/17 1056  . sodium chloride flush (NS) 0.9 % injection 10-40 mL  10-40 mL Intracatheter PRN [name], MD   10 mL at 10/07/17 0835  . sodium chloride flush (NS) 0.9 % injection 3 mL  3 mL Intravenous Q12H [name], MD   10 mL at 10/06/17 2130  . sodium chloride flush (NS) 0.9 % injection 3 mL  3 mL Intravenous PRN [name], MD      . sodium chloride flush (NS) 0.9 % injection 3 mL  3 mL Intravenous Q12H [name], MD   3 mL at 10/06/17 1049  . sodium chloride flush (NS) 0.9 % injection 3 mL  3 mL Intravenous PRN [name], MD      . torsemide [facility]) tablet 60 mg  60 mg Oral Daily Fay Records, MD   60 mg at 10/08/17 1242     Discharge Medications: Please see discharge summary for a list of discharge medications.  Relevant Imaging Results:  Relevant Lab Results:   Additional Information SS#[id number]  [place], [place]

## 2017-10-09 DIAGNOSIS — I2721 Secondary pulmonary arterial hypertension: Secondary | ICD-10-CM

## 2017-10-09 LAB — BASIC METABOLIC PANEL
Anion gap: 8 (ref 5–15)
BUN: 26 mg/dL — AB (ref 6–20)
CHLORIDE: 88 mmol/L — AB (ref 101–111)
CO2: 43 mmol/L — ABNORMAL HIGH (ref 22–32)
CREATININE: 1.73 mg/dL — AB (ref 0.61–1.24)
Calcium: 8.6 mg/dL — ABNORMAL LOW (ref 8.9–10.3)
GFR calc Af Amer: 49 mL/min — ABNORMAL LOW (ref >60)
GFR calc non Af Amer: 42 mL/min — ABNORMAL LOW (ref >60)
Glucose, Bld: 142 mg/dL — ABNORMAL HIGH (ref 65–99)
POTASSIUM: 4.1 mmol/L (ref 3.5–5.1)
SODIUM: 139 mmol/L (ref 135–145)

## 2017-10-09 LAB — CBC WITH DIFFERENTIAL/PLATELET
BASOS ABS: 0 10*3/uL (ref 0.0–0.1)
Basophils Relative: 0 %
EOS PCT: 2 %
Eosinophils Absolute: 0.1 10*3/uL (ref 0.0–0.7)
HCT: 28.6 % — ABNORMAL LOW (ref 39.0–52.0)
Hemoglobin: 7.8 g/dL — ABNORMAL LOW (ref 13.0–17.0)
LYMPHS ABS: 0.5 10*3/uL — AB (ref 0.7–4.0)
Lymphocytes Relative: 7 %
MCH: 21.9 pg — ABNORMAL LOW (ref 26.0–34.0)
MCHC: 27.3 g/dL — ABNORMAL LOW (ref 30.0–36.0)
MCV: 80.3 fL (ref 78.0–100.0)
MONO ABS: 0.3 10*3/uL (ref 0.1–1.0)
Monocytes Relative: 4 %
NEUTROS PCT: 87 %
Neutro Abs: 5.9 10*3/uL (ref 1.7–7.7)
PLATELETS: 136 10*3/uL — AB (ref 150–400)
RBC: 3.56 MIL/uL — AB (ref 4.22–5.81)
RDW: 25.2 % — AB (ref 11.5–15.5)
WBC: 6.8 10*3/uL (ref 4.0–10.5)

## 2017-10-09 LAB — GLUCOSE, CAPILLARY
GLUCOSE-CAPILLARY: 137 mg/dL — AB (ref 65–99)
GLUCOSE-CAPILLARY: 163 mg/dL — AB (ref 65–99)
Glucose-Capillary: 187 mg/dL — ABNORMAL HIGH (ref 65–99)
Glucose-Capillary: 205 mg/dL — ABNORMAL HIGH (ref 65–99)

## 2017-10-09 LAB — COOXEMETRY PANEL
Carboxyhemoglobin: 2.5 % — ABNORMAL HIGH (ref 0.5–1.5)
METHEMOGLOBIN: 1.6 % — AB (ref 0.0–1.5)
O2 SAT: 64.1 %
TOTAL HEMOGLOBIN: 7.9 g/dL — AB (ref 12.0–16.0)

## 2017-10-09 NOTE — Progress Notes (Signed)
Progress Note  Patient Name: Tony Jones Date of Encounter: 10/09/2017  Primary Cardiologist: No primary care provider on file.   Subjective   Both the patient and his mother believe that Tony Jones breathing is improving. Net diuresis almost 2 L in last 24 hours, 14 L since admission. Weight today has to be inaccurate and should be rechecked. Creatinine slightly higher but BUN unchanged over the last few days. Co-ox 64%.  Inpatient Medications    Scheduled Meds: . allopurinol  300 mg Oral Q supper  . atorvastatin  40 mg Oral q1800  . fesoterodine  8 mg Oral Q supper  . heparin  5,000 Units Subcutaneous Q8H  . insulin aspart  0-9 Units Subcutaneous TID WC  . lidocaine  1 application Urethral Once  . midodrine  10 mg Oral TID WC  . multivitamin with minerals  1 tablet Oral Q breakfast  . oxybutynin  5 mg Oral BID WC  . pantoprazole  40 mg Oral BID WC  . sertraline  100 mg Oral Q breakfast  . sildenafil  40 mg Oral TID  . sodium chloride flush  3 mL Intravenous Q12H  . sodium chloride flush  3 mL Intravenous Q12H  . torsemide  60 mg Oral Daily   Continuous Infusions: . sodium chloride    . sodium chloride     PRN Meds: sodium chloride, sodium chloride, acetaminophen, docusate sodium, magnesium hydroxide, ondansetron (ZOFRAN) IV, ondansetron, sodium chloride flush, sodium chloride flush, sodium chloride flush   Vital Signs    Vitals:   10/09/17 0056 10/09/17 0546 10/09/17 0623 10/09/17 0813  BP: (!) 111/57  97/67 111/75  Pulse: 95 98 100 96  Resp: (!) 25 18 (!) 22 19  Temp: 98.6 F (37 C) 99.2 F (37.3 C)  98.7 F (37.1 C)  TempSrc: Axillary Oral  Axillary  SpO2: 95% 95% 98% 94%  Weight:  201 lb 8 oz (91.4 kg)    Height:        Intake/Output Summary (Last 24 hours) at 10/09/2017 1154 Last data filed at 10/09/2017 0700 Gross per 24 hour  Intake -  Output 1350 ml  Net -1350 ml   Filed Weights   10/07/17 0500 10/08/17 0519 10/09/17 0546  Weight: 193 lb 5.5  oz (87.7 kg) 193 lb 9 oz (87.8 kg) 201 lb 8 oz (91.4 kg)    Telemetry    Sinus rhythm- Personally Reviewed  ECG    No new tracing- Personally Reviewed  Physical Exam  Looks comfortable sitting up, eating voraciously. GEN: No acute distress.   Neck: No JVD Cardiac: RRR, loud S2, no murmurs, rubs, or gallops.  Respiratory: Clear to auscultation bilaterally. GI: Soft, nontender, non-distended  MS: No edema; No deformity. Neuro:  Nonfocal  Psych: Normal affect   Labs    Chemistry Recent Labs  Lab 10/07/17 0245 10/08/17 0657 10/09/17 0418  NA 142 141 139  K 4.4 4.7 4.1  CL 89* 89* 88*  CO2 46* 44* 43*  GLUCOSE 105* 120* 142*  BUN 26* 26* 26*  CREATININE 1.66* 1.56* 1.73*  CALCIUM 8.8* 8.7* 8.6*  GFRNONAA 45* 48* 42*  GFRAA 52* 56* 49*  ANIONGAP 7 8 8      Hematology Recent Labs  Lab 10/06/17 0500 10/08/17 0657 10/09/17 0418  WBC 6.7 6.5 6.8  RBC 4.06* 3.78* 3.56*  HGB 8.4* 8.1* 7.8*  HCT 31.9* 30.9* 28.6*  MCV 78.6 81.7 80.3  MCH 20.7* 21.4* 21.9*  MCHC 26.3* 26.2*  27.3*  RDW 22.4* 24.0* 25.2*  PLT 167 145* 136*    Cardiac EnzymesNo results for input(s): TROPONINI in the last 168 hours. No results for input(s): TROPIPOC in the last 168 hours.   BNPNo results for input(s): BNP, PROBNP in the last 168 hours.   DDimer No results for input(s): DDIMER in the last 168 hours.   Radiology    No results found.  Cardiac Studies   R and L heart cath   Coronary Findings   Diagnostic  Dominance: Right  Left Main  No left main, separate ostia LAD and LCx.  Left Anterior Descending  Separate ostium of LAD. LAD has luminal irregularities.  Left Circumflex  Separate ostium of LCx. Luminal irregularities.  Right Coronary Artery  Large caliber RCA with luminal irregularities. 50% stenosis ostial PDA.     Right Heart   Right Heart Pressures RHC Procedural Findings: Hemodynamics (mmHg) RA 16 RV 92/16 PA 93/51 mean 67 PCWP mean 27 LV  110/21 AO 115/75  Oxygen saturations: PA 56% AO 99%  Cardiac Output (Fick) 4.95  Cardiac Index (Fick) 2.65 PVR 8 WU PAPi 2.65   ECHO 09/28/2017 Study Conclusions  - Left ventricle: The cavity size was normal. There was mild   concentric hypertrophy. Systolic function was normal. The   estimated ejection fraction was in the range of 60% to 65%. Wall   motion was normal; there were no regional wall motion   abnormalities. Doppler parameters are consistent with abnormal   left ventricular relaxation (grade 1 diastolic dysfunction).   Doppler parameters are consistent with high ventricular filling   pressure. - Ventricular septum: The contour showed diastolic flattening and   systolic flattening consistent with right ventricular pressure   and volume overload. - Aortic valve: Transvalvular velocity was within the normal range.   There was no stenosis. There was no regurgitation. - Mitral valve: Calcified annulus. There was no regurgitation. - Right ventricle: The cavity size was normal. Wall thickness was   normal. Systolic function was normal. - Atrial septum: No defect or patent foramen ovale was identified. - Tricuspid valve: There was mild regurgitation. - Pulmonary arteries: Systolic pressure was severely increased. PA   peak pressure: 75 mm Hg (S). - Pericardium, extracardiac: A trivial pericardial effusion was   identified.    Patient Profile     57 y.o. male with severe pulmonary hypertension and secondary right heart failure as well as left ventricular diastolic dysfunction  Assessment & Plan      1  PAH/ RV failure: Severe pulmonary hypertension confirmed by today's measurement.  Started on PDE 5 inhibitor.  Continuous oxygen.  He cannot tolerate CPAP.  Requires midodrine to maintain systemic blood pressure due to low cardiac output.   So far tolerating diuresis without major changes in renal parameters.  Exact mechanism of pulmonary hypertension it is not fully  understood.  No overt interstitial lung disease on the limited quality CT chest.  Needs pulmonary function tests but it is unclear how well he will be able to cooperate with these.  Pulmonary artery wedge pressure was moderately elevated, but cannot explain severe pulmonary arteriolar disease. 2.  CKD Stage 3: monitor renal function daily while diuresing  3.  CAD: Despite extensive coronary calcification seen on chest CT, no significant obstructive disease on invasive angiography.  Keep on statin.  Not receiving aspirin due to GI bleeding. 4. Hx symptomatic cholelithiasis, GI bleed in March 2019  EGD with gastritis  EGD cancelled on 6/4 due  to hypoxia. Keep on PPI    For questions or updates, please contact [place] Please consult [email or web address] for contact info under Cardiology/STEMI.      Signed, Sanda Klein, MD  10/09/2017, 11:54 AM

## 2017-10-09 NOTE — Progress Notes (Addendum)
Patient/family refuses CPAP.

## 2017-10-09 NOTE — Progress Notes (Signed)
Report received from C. Mauri Reading. Reviewed previous assessment with no noted changes at this time. Orders reviewed. Patient resting comfortably in bed at this time. Will continue to monitor closely throughout the night.

## 2017-10-09 NOTE — Plan of Care (Signed)
Care plans reviewed and patient is progressing.  

## 2017-10-10 ENCOUNTER — Encounter: Payer: Self-pay | Admitting: *Deleted

## 2017-10-10 LAB — BASIC METABOLIC PANEL
Anion gap: 5 (ref 5–15)
BUN: 28 mg/dL — AB (ref 6–20)
CHLORIDE: 93 mmol/L — AB (ref 101–111)
CO2: 43 mmol/L — ABNORMAL HIGH (ref 22–32)
Calcium: 8.8 mg/dL — ABNORMAL LOW (ref 8.9–10.3)
Creatinine, Ser: 1.54 mg/dL — ABNORMAL HIGH (ref 0.61–1.24)
GFR calc Af Amer: 57 mL/min — ABNORMAL LOW (ref >60)
GFR calc non Af Amer: 49 mL/min — ABNORMAL LOW (ref >60)
Glucose, Bld: 124 mg/dL — ABNORMAL HIGH (ref 65–99)
POTASSIUM: 4.1 mmol/L (ref 3.5–5.1)
Sodium: 141 mmol/L (ref 135–145)

## 2017-10-10 LAB — CBC WITH DIFFERENTIAL/PLATELET
Basophils Absolute: 0 10*3/uL (ref 0.0–0.1)
Basophils Relative: 0 %
EOS PCT: 3 %
Eosinophils Absolute: 0.2 10*3/uL (ref 0.0–0.7)
HEMATOCRIT: 29.2 % — AB (ref 39.0–52.0)
Hemoglobin: 7.9 g/dL — ABNORMAL LOW (ref 13.0–17.0)
LYMPHS ABS: 0.5 10*3/uL — AB (ref 0.7–4.0)
LYMPHS PCT: 6 %
MCH: 22.1 pg — ABNORMAL LOW (ref 26.0–34.0)
MCHC: 27.1 g/dL — AB (ref 30.0–36.0)
MCV: 81.8 fL (ref 78.0–100.0)
MONOS PCT: 4 %
Monocytes Absolute: 0.3 10*3/uL (ref 0.1–1.0)
NEUTROS ABS: 6.5 10*3/uL (ref 1.7–7.7)
Neutrophils Relative %: 87 %
Platelets: 130 10*3/uL — ABNORMAL LOW (ref 150–400)
RBC: 3.57 MIL/uL — AB (ref 4.22–5.81)
RDW: 26.8 % — AB (ref 11.5–15.5)
WBC: 7.5 10*3/uL (ref 4.0–10.5)

## 2017-10-10 LAB — COOXEMETRY PANEL
CARBOXYHEMOGLOBIN: 2.6 % — AB (ref 0.5–1.5)
Methemoglobin: 1.6 % — ABNORMAL HIGH (ref 0.0–1.5)
O2 Saturation: 73.5 %
Total hemoglobin: 7.4 g/dL — ABNORMAL LOW (ref 12.0–16.0)

## 2017-10-10 LAB — GLUCOSE, CAPILLARY
Glucose-Capillary: 122 mg/dL — ABNORMAL HIGH (ref 65–99)
Glucose-Capillary: 156 mg/dL — ABNORMAL HIGH (ref 65–99)
Glucose-Capillary: 144 mg/dL — ABNORMAL HIGH (ref 65–99)
Glucose-Capillary: 160 mg/dL — ABNORMAL HIGH (ref 65–99)

## 2017-10-10 LAB — URINE CULTURE: Culture: <10000 — AB

## 2017-10-10 MED FILL — Heparin Sod (Porcine)-NaCl IV Soln 1000 Unit/500ML-0.9%: INTRAVENOUS | Qty: 1000 | Status: AC

## 2017-10-10 NOTE — Progress Notes (Signed)
Clinical Education officer, museum following patient for support and discharge needs. CSW received phone call from [facility] stating that patients request to go to SNF has been denied by insurance company. CSW will follow up with patients guardian to make her aware of decision.   Tony Jones, MSW,  [place]

## 2017-10-10 NOTE — Progress Notes (Addendum)
Patient ID: Tony Jones, male   DOB: [birth year], 57 y.o.   MRN: [id number]     Advanced Heart Failure Rounding Note  PCP-Cardiologist: No primary care provider on file.   Subjective:    EGD 10/04/17 cancelled due to hypoxia. Hgb remains stable this morning without overt bleeding.   Weight up 3 lbs over weekend. Denies SOB. Tolerated HFNC ~ 10 lpm for the most part over the weekend. On 12 lpm this am but had an incident with wetting the bed so had high anxiety level. Denies lightheadedness or dizziness. No CP. Mother confirms that she has spoken to his home O2 provider, and they are able to provide HFNC UP TO 10 lpm for home.   High res Chest CT 10/01/17: Limited evaluation for ILD because of presence of pulmonary edema. The RV and PA were dilated.  There was extensive coronary calcification.   Abd [place] 10/02/17: Cholelithiasis without evidence for cholecystitis. Hepatic steatosis.  Small ascites.   RHC/LHC (6/5):  Coronary Findings  Diagnostic  Dominance: Right  Left Main  No left main, separate ostia LAD and LCx.  Left Anterior Descending  Separate ostium of LAD. LAD has luminal irregularities.  Left Circumflex  Separate ostium of LCx. Luminal irregularities.  Right Coronary Artery  Large caliber RCA with luminal irregularities. 50% stenosis ostial PDA.  Intervention   No interventions have been documented.  Right Heart   Right Heart Pressures    RHC Procedural Findings: Hemodynamics (mmHg) RA 16 RV 92/16 PA 93/51 mean 67 PCWP mean 27 LV 110/21 AO 115/75  Oxygen saturations: PA 56% AO 99%  Cardiac Output (Fick) 4.95  Cardiac Index (Fick) 2.65 PVR 8 WU PAPi 2.65    Objective:   Weight Range: 196 lb 3.4 oz (89 kg) Body mass index is 37.07 kg/m.   Vital Signs:   Temp:  [97.8 F (36.6 C)-98.7 F (37.1 C)] 97.8 F (36.6 C) (06/10 0539) Pulse Rate:  [96-103] 102 (06/10 0539) Resp:  [16-26] 17 (06/10 0539) BP: (102-121)/(62-75) 102/65 (06/10 0539) SpO2:  [87  %-100 %] 96 % (06/10 0539) Weight:  [194 lb 11.2 oz (88.3 kg)-196 lb 3.4 oz (89 kg)] 196 lb 3.4 oz (89 kg) (06/10 0539) Last BM Date: 10/09/17  Weight change: Filed Weights   10/09/17 0546 10/09/17 1500 10/10/17 0539  Weight: 201 lb 8 oz (91.4 kg) 194 lb 11.2 oz (88.3 kg) 196 lb 3.4 oz (89 kg)   Intake/Output:   Intake/Output Summary (Last 24 hours) at 10/10/2017 0752 Last data filed at 10/10/2017 0717 Gross per 24 hour  Intake 760 ml  Output 1325 ml  Net -565 ml    Physical Exam   General: NAD  HEENT: Normal Neck: Supple. JVP difficult. Carotids 2+ bilat; no bruits. No thyromegaly or nodule noted. Cor: PMI nondisplaced. RRR, No M/G/R noted Lungs: CTAB, normal effort. Abdomen: Soft, non-tender, non-distended, no HSM. No bruits or masses. +BS  Extremities: No cyanosis, clubbing, or rash. Trace ankle edema.  Neuro: Alert & orientedx3, cranial nerves grossly intact. moves all 4 extremities w/o difficulty. Affect pleasant   Telemetry   NSR/Sinus tach 90-100s, personally reviewed.   Labs    CBC Recent Labs    10/09/17 0418 10/10/17 0510  WBC 6.8 7.5  NEUTROABS 5.9 6.5  HGB 7.8* 7.9*  HCT 28.6* 29.2*  MCV 80.3 81.8  PLT 136* 536*   Basic Metabolic Panel Recent Labs    10/09/17 0418 10/10/17 0510  NA 139 141  K 4.1 4.1  CL 88* 93*  CO2 43* 43*  GLUCOSE 142* 124*  BUN 26* 28*  CREATININE 1.73* 1.54*  CALCIUM 8.6* 8.8*   Liver Function Tests No results for input(s): AST, ALT, ALKPHOS, BILITOT, PROT, ALBUMIN in the last 72 hours. No results for input(s): LIPASE, AMYLASE in the last 72 hours. Cardiac Enzymes No results for input(s): CKTOTAL, CKMB, CKMBINDEX, TROPONINI in the last 72 hours.  BNP: BNP (last 3 results) Recent Labs    07/30/17 0950 09/30/17 2317  BNP 290.8* 928.6*    ProBNP (last 3 results) No results for input(s): PROBNP in the last 8760 hours.   D-Dimer No results for input(s): DDIMER in the last 72 hours. Hemoglobin A1C No  results for input(s): HGBA1C in the last 72 hours. Fasting Lipid Panel No results for input(s): CHOL, HDL, LDLCALC, TRIG, CHOLHDL, LDLDIRECT in the last 72 hours. Thyroid Function Tests No results for input(s): TSH, T4TOTAL, T3FREE, THYROIDAB in the last 72 hours.  Invalid input(s): FREET3  Other results:   Imaging    No results found.   Medications:     Scheduled Medications: . allopurinol  300 mg Oral Q supper  . atorvastatin  40 mg Oral q1800  . fesoterodine  8 mg Oral Q supper  . heparin  5,000 Units Subcutaneous Q8H  . insulin aspart  0-9 Units Subcutaneous TID WC  . lidocaine  1 application Urethral Once  . midodrine  10 mg Oral TID WC  . multivitamin with minerals  1 tablet Oral Q breakfast  . oxybutynin  5 mg Oral BID WC  . pantoprazole  40 mg Oral BID WC  . sertraline  100 mg Oral Q breakfast  . sildenafil  40 mg Oral TID  . sodium chloride flush  3 mL Intravenous Q12H  . sodium chloride flush  3 mL Intravenous Q12H  . torsemide  60 mg Oral Daily    Infusions: . sodium chloride    . sodium chloride      PRN Medications: sodium chloride, sodium chloride, acetaminophen, docusate sodium, magnesium hydroxide, ondansetron (ZOFRAN) IV, ondansetron, sodium chloride flush, sodium chloride flush, sodium chloride flush   Assessment/Plan   1. RV failure/pulmonary HTN:  Suspect the patient has severe RV failure in the setting of severe pulmonary hypertension. Echo showed flattened septum and severe pulmonary hypertension. On CT chest RV and PA were dilated. I strongly suspect group 3 PH from chronic hypoxemia, probably OHS/OSA.  He has not had a sleep study to diagnosis OSA but it is strongly suspected.  V/Q scan in 2016 did not suggest chronic PE.  RHC showed severely elevated PA pressure yesterday with preserved PAPi and elevated RA pressure and PCWP.  Cannot rule out group 1 PH contribution with markedly high PA pressure.  - Continue to encouraged to try CPAP with  nasal pillows.   - Continue torsemide 60 mg daily.  - Continue sildenafil 40 mg TID.  - Keep oxygen saturation up with nasal cannula/HFNC.  RN will check Oxygen saturations for O2 requirement.  - Continue midodrine 10 mg tid to maintain BP, suspect hypotension is due to RV failure..  2. Chronic hypoxemic respiratory failure: As above suspect OHS/OSA.  High resolution chest CT was limited by pulmonary edema but did not appear to show interstitial lung disease.  PCCM agrees. - CCM has seen and ordered CPAP QHS, will have him try it with nasal pillows tonight. Recommend outpatient PFTs.  - HFNC with flow up to 15 with napping, stable on 6  lpm while awake.  - Continue to encouraged to try CPAP.  3. CKD: Stage 3.   - Creatinine 1.54.  4. Cholelithiasis: Abdomen is not tender today and he has been eating meals.  Has had symptomatic cholelithiasis and cholecystectomy recommended. RUQ [place] 6/1, cholelithiasis with no evidence for active cholecystitis.    - Will eventually need gallbladder addressed but not urgent at this time. No change.  5. Anemia: Chronic, with Fe deficiency. He had a dose of IV Fe.  Most recently, had UGIB in 3/19 with EGD showing no definite cause for bleeding (other than gastritis). Occult stool positive. EGD 10/04/17 cancelled due to hypoxia. Hemoglobin is stable currently.  - Continue BID PPI.  - Hgb 7.9. No definite bleeding.  6. Developmental delay - Mother at bedside to assist communication. - No change to current plan.   7. Undefined muscular dystrophy:  - Continue to walk in hall with PT.  8. CAD: Strong family history of premature coronary disease.  He had extensive coronary calcification on CT chest this admission.  He has epigastric discomfort that has been attributed to symptomatic cholelithiasis, but given extensive coronary calcification, was concerned it could actually be angina. Coronary angiography 10/05/17 showed nonobstructive disease.  - No s/s of ischemia.  -  Continue statin.  - No ASA yet with low Hgb and GI bleed.   Will discuss with MD. May be able to get home today vs tomorrow depending on O2 arrangement.   Length of Stay: [address]  Annamaria Helling  10/10/2017, 7:52 AM  Advanced Heart Failure Team Pager [phone] (M-F; 7a - 4p)  Please contact [facility] for night-coverage after hours (4p -7a ) and weekends on [email or web address]  Patient seen with PA, agree with the above note.  He is stable today on torsemide 60 mg daily.    I think he can go home today if we can work out the appropriate home oxygen for him.  Will need to work with his homecare agency.   He will need close followup in CHF clinic.   Cardiac meds for discharge: Torsemide 60 mg daily Sildenafil 40 mg tid Midodrine 10 mg tid Protonix 40 mg bid Atorvastatin 40 mg daily.   Loralie Champagne 10/10/2017 12:57 PM

## 2017-10-10 NOTE — Care Management Note (Signed)
Case Management Note Marvetta Gibbons RN, BSN Unit 4E-Case Manager [phone]  Patient Details  Name: Tony Jones MRN: [id number] Date of Birth: Oct 31, 1960  Subjective/Objective:  Pt admitted with acute on chronic respiratory failure likely multifactorial including pulmonary hypertension, pulm edema, pleural effusion and possible OSA/OHS, CHF, cholelithiasis- may need surgery involvement when stabilized.                 Action/Plan: PTA pt lived at home with mom, hx of mental delay. Has home 02 at 2L baseline. Per PT/OT evals recommendations for [facility] services- CM to follow for transition of care needs when stable- will need orders for [facility] prior to discharge.   Expected Discharge Date:                  Expected Discharge Plan:  Skilled Nursing Facility  In-House Referral:  Clinical Social Work  Discharge planning Services  CM Consult  Post Acute Care Choice:  Home Health Choice offered to:  Parent  DME Arranged:  Oxygen DME Agency:  [facility]  HH Arranged:    [facility] Agency:     Status of Service:  In process, will continue to follow  If discussed at Long Length of Stay Meetings, dates discussed:    Discharge Disposition:   Additional Comments:  10/10/17- 1630- Marvetta Gibbons RN,CM-  Noted plans for pt to return home with HFNC 10L- called Ivin Booty with [facility] this am to discuss what documentation was needed for them to exchange equipment in the home- bedside RN to check 02 sats and updated note faxed along with MD note and new 02 orders to [facility] today for review- per conversation with Ivin Booty this afternoon pt will also need CMN form filled out and signed by PA/MD for approval of HF 02 for home form faxed to CM and placed on shadow chart - spoke with Amy with the HF team regarding form and need for [place] PA to sign the form in the am- msg to be left for Jonni Sanger - once form signed- CM will f/u and fax it back to [facility]. Have spoken to mom and pt at  bedside to update where things are with home 02. Per CSW this afternoon pt has been denied by insurance for STSNF- pt would benefit from HHRN/PT/OT - please place orders for transition home. CM will f/u in am for continued transition needs.   10/07/17- 58- Aybree Lanyon RN, CM- PT following pt for transition needs- initial recommendation for [facility] however mom concerned about pt needed more consistent therapy to try to get pt more active. Recommendation now for STSNF- not sure insurance will approve however will have CSW assess and see if pt can get approved per insurance for STSNF- mom understands that if insurance does not approve then pt will need to return home with [facility] services- spoke with both mom and brother at bedside who understand the process and want first to see about STSNF vs home with [facility]. Pt has home 02 with [facility]- [phone]- baseline is 2-4L with max of 5L- mom states she called Ivin Booty at [facility] and they told her that they can provide equipment that can go up to 10 L.- if pt does go home with [facility] will need to update home 02 orders and equipment for home- CM to continue to follow for transition of care needs.   Dawayne Patricia, RN 10/10/2017, 6:44 PM

## 2017-10-10 NOTE — Consult Note (Signed)
   [facility] CM Inpatient Consult   10/10/2017  Tony Jones Mar 04, 1961 [id number]   [facility] Care Management follow up.   Went back to bedside to speak with Tony Jones (mom) at bedside to discuss Tony Jones Management follow up post discharge.Patient has developmental delay. Tony Jones is agreeable and [facility] Care Management written consent obtained.   Tony Jones (mom) is primary caregiver. Tony Jones is primary contact person at (cell) [phone] and (home) [phone]. Confirmed Primary Care MD is Dr. Kathryne Eriksson ([place]- this office is not listed as doing toc)  Explained [place] Management will not interfere or replace services provided by home health. Patient is on 10L/[place]. He is supposed to go home with on this amount of oxygen.  Discussed referral for [facility]. Tony Jones has history of pulmonary HTN, CKD, anemia, CAD, developmental delay, CHF. Risk of unplanned readmission score of 25% (high).  Discussed above notes with inpatient RNCM.     Marthenia Rolling, MSN-Ed, RN,BSN [facility] Liaison [phone]

## 2017-10-10 NOTE — Care Management Important Message (Signed)
Important Message  Patient Details  Name: Tony Jones MRN: [id number] Date of Birth: 01/20/1961   Medicare Important Message Given:  Yes    [name] P [name] 10/10/2017, 3:32 PM

## 2017-10-10 NOTE — Progress Notes (Signed)
Patient/family refuses CPAP.

## 2017-10-10 NOTE — Discharge Summary (Signed)
Advanced Heart Failure Discharge Note  Discharge Summary   Patient ID: Tony Jones MRN: [id number], DOB/AGE: May 01, 1961 57 y.o. Admit date: 09/30/2017 D/C date:     10/13/2017   Primary Discharge Diagnoses:  1. RV failure/pulmonary HTN 2. Chronic hypoxemic respiratory failure 3. CKD: Stage 3 4. Cholelithiasis 5. Anemia 6. Developmental delay 7. Undefined muscular dystrophy 8. CAD  Hospital Course:   Tony Jones is a 57 y.o. male with complex history of chronic mental impairment since childhood, chronic hypoxia with witness apnea, h/o Barrett's esophagus, anemia, anxiety, cholelithiasis, DM, GERD, HTN, Pulmonary HTN, and RV failure. Admitted to [facility] 09/30/17 with malaise and failure to thrive.   1. RV failure/pulmonary HTN:  Suspect the patient has severe RV failure in the setting of severe pulmonary hypertension. Echo showed flattened septum and severe pulmonary hypertension. On CT chest RV and PA were dilated. I strongly suspect group 3 PH from chronic hypoxemia, probably OHS/OSA. He has not had a sleep study to diagnosis OSA but it is strongly suspected. V/Q scan in 2016 did not suggest chronic PE.  RHC showed severely elevated PA pressure yesterday with preserved PAPi and elevated RA pressure and PCWP.  Cannot rule out group 1 PH contribution with markedly high PA pressure.  - Continue to encouraged to try CPAP with nasal pillows.   - Continue torsemide 80mg  daily.  - Continue sildenafil 60 mg TID.  - Keep oxygen saturation up with nasal cannula/HFNC.   - Continue midodrine 10 mg tid to maintain BP, suspect hypotension is due to RV failure..  2. Chronic hypoxemic respiratory failure:  - Suspect primary contributor is OHS/OSA. High resolution chest CT was limited by pulmonary edema but did not appear to show interstitial lung disease. Pulmonary critical care saw and agreed.  - CCP saw and ordered CPAP, but patient was unable to tolerate despite several masks.  - Required HFNC up to  15 at times. Mother confirmed with [place] they could provide HFNC up to 10 lpm, so this was ordered for home.  - Will ordered Outpatient PFTs.  3. CKD: Stage 3.   - Stable with diuresis.  4. Cholelithiasis: - Abdomen has not been tender and has tolerated diet. He has had symptomatic cholelithiasis and cholecystectomy recommended.  - RUQ [place] 6/1, cholelithiasis with no evidence for active cholecystitis. Can follow up as outpatient.  5. Anemia: Chronic, with Fe deficiency.  - Received dose of IV Fe here. UGIB 07/2017 with no definite cause apart from gastritis.  - FOBT + this admit, but EGD 10/04/17 cancelled due to increased O2 demand.  - Follow up as outpatient. Continue BID PPI.  - Capsule endoscopy performed 10/12/17 which was unremarkable.  6. Developmental delay - Mother assisted with communication and decision making.  7. Undefined muscular dystrophy:  - Continue HH.  8. CAD: - Coronary angiography 10/05/17 showed nonobstructive disease.  - Continue statin. No ASA with low Hgb and GI bleed.   He was examined am of 10/13/17 and determined to be stable for home with [facility] providing HFNC. He will be discharged to home in care of his mother, with close follow up as below.   Discharge Weight Range: 196 lbs. Discharge Vitals: Blood pressure 103/66, pulse (!) 101, temperature 97.7 F (36.5 C), temperature source Core (Comment), resp. rate 20, height 5\' 1"  (1.549 m), weight 192 lb (87.1 kg), SpO2 99 %.  Labs: Lab Results  Component Value Date   WBC 8.6 10/12/2017   HGB 9.8 (L) 10/12/2017  HCT 34.4 (L) 10/12/2017   MCV 85.1 10/12/2017   PLT 136 (L) 10/12/2017    Recent Labs  Lab 10/13/17 0710  NA 140  K 3.6  CL 89*  CO2 41*  BUN 24*  CREATININE 1.50*  CALCIUM 8.9  GLUCOSE 139*   Lab Results  Component Value Date   CHOL 106 10/03/2017   HDL 31 (L) 10/03/2017   LDLCALC 56 10/03/2017   TRIG 94 10/03/2017   BNP (last 3 results) Recent Labs    07/30/17 0950 09/30/17 2317  BNP  290.8* 928.6*    ProBNP (last 3 results) No results for input(s): PROBNP in the last 8760 hours.   Diagnostic Studies/Procedures   No results found.  Discharge Medications   Allergies as of 10/13/2017      Reactions   Codeine Nausea And Vomiting      Medication List    STOP taking these medications   furosemide 80 MG tablet Commonly known as:  LASIX   metoprolol succinate 25 MG 24 hr tablet Commonly known as:  TOPROL XL   OXYGEN     TAKE these medications   acetaminophen 650 MG CR tablet Commonly known as:  TYLENOL Take 1,300 mg by mouth daily as needed for pain.   allopurinol 300 MG tablet Commonly known as:  ZYLOPRIM Take 300 mg by mouth daily with supper.   atorvastatin 40 MG tablet Commonly known as:  LIPITOR Take 1 tablet (40 mg total) by mouth daily at 6 PM.   docusate sodium 100 MG capsule Commonly known as:  COLACE Take 100 mg by mouth daily as needed for mild constipation.   glimepiride 2 MG tablet Commonly known as:  AMARYL Take 2 mg by mouth daily with breakfast.   levocetirizine 5 MG tablet Commonly known as:  XYZAL Take 5 mg by mouth daily with breakfast.   meclizine 25 MG tablet Commonly known as:  ANTIVERT Take 25 mg by mouth 3 (three) times daily as needed for dizziness.   metFORMIN 1000 MG tablet Commonly known as:  GLUCOPHAGE Take 1,000 mg by mouth 2 (two) times daily with a meal.   midodrine 10 MG tablet Commonly known as:  PROAMATINE Take 1 tablet (10 mg total) by mouth 3 (three) times daily with meals. What changed:    medication strength  how much to take   multivitamin with minerals Tabs tablet Take 1 tablet by mouth daily with breakfast.   OVER THE COUNTER MEDICATION Place 1 drop into both eyes daily as needed (dry eyes). Over the counter lubricating eye drop   oxybutynin 5 MG tablet Commonly known as:  DITROPAN Take 5 mg by mouth 2 (two) times daily with a meal.   pantoprazole 40 MG tablet Commonly known as:   PROTONIX Take 40 mg by mouth 2 (two) times daily with a meal.   potassium chloride SA 20 MEQ tablet Commonly known as:  K-DUR,KLOR-CON Take 1 tablet (20 mEq total) by mouth daily. Start taking on:  10/14/2017   sertraline 100 MG tablet Commonly known as:  ZOLOFT Take 100 mg by mouth daily with breakfast.   sildenafil 20 MG tablet Commonly known as:  REVATIO Take 3 tablets (60 mg total) by mouth 3 (three) times daily.   tiZANidine 2 MG tablet Commonly known as:  ZANAFLEX Take 1 mg by mouth every 6 (six) hours as needed (leg cramps).   torsemide 20 MG tablet Commonly known as:  DEMADEX Take 4 tablets (80 mg total) by mouth  daily. Start taking on:  10/14/2017   TOVIAZ 8 MG Tb24 tablet Generic drug:  fesoterodine Take 8 mg by mouth daily with supper.   vitamin B-12 500 MCG tablet Commonly known as:  CYANOCOBALAMIN Take 2 tablets (1,000 mcg total) by mouth daily.            Durable Medical Equipment  (From admission, onward)        Start     Ordered   10/13/17 1140  Heart failure home health orders  (Heart failure home health orders / Face to face)  Once    Comments:  Heart Failure Follow-up Care:  Verify follow-up appointments per Patient Discharge Instructions. Confirm transportation arranged. Reconcile home medications with discharge medication list. Remove discontinued medications from use. Assist patient/caregiver to manage medications using pill box. Reinforce low sodium food selection Assessments: Vital signs and oxygen saturation at each visit. Assess home environment for safety concerns, caregiver support and availability of low-sodium foods. Consult Education officer, museum, PT/OT, Dietitian, and CNA based on assessments. Perform comprehensive cardiopulmonary assessment. Notify MD for any change in condition or weight gain of 3 pounds in one day or 5 pounds in one week with symptoms. Daily Weights and Symptom Monitoring: Ensure patient has access to scales. Teach  patient/caregiver to weigh daily before breakfast and after voiding using same scale and record.    Teach patient/caregiver to track weight and symptoms and when to notify Provider. Activity:   Develop individualized activity plan with patient/caregiver.  HHRN HHPT  Question Answer Comment  Heart Failure Follow-up Care Advanced Heart Failure (AHF) Clinic at [phone]   Lab frequency Other see comments   Fax lab results to AHF Clinic at [phone]   Diet Low Sodium Heart Healthy   Fluid restrictions: 2000 mL Fluid   Initiate Heart Failure Clinic Diuretic Protocol to be used by [place] only ( to be ordered by Heart Failure Team Providers Only) Yes      10/13/17 1140   10/13/17 0000  For home use only DME oxygen    Question Answer Comment  Mode or (Route) Nasal cannula   Liters per Minute 10   Frequency Continuous (stationary and portable oxygen unit needed)   Oxygen conserving device Yes   Oxygen delivery system Gas      10/13/17 1324   10/10/17 0900  For home use only DME oxygen  Once    Comments:  Per pt mother, she has confirmed with their [facility] agency that they can provide High Flow Nasal Cannula up to 10 lpm. Please order.  Question Answer Comment  Mode or (Route) Nasal cannula   Liters per Minute 10   Frequency Continuous (stationary and portable oxygen unit needed)   Oxygen conserving device Yes   Oxygen delivery system Other see comments      10/10/17 0900      Disposition   The patient will be discharged in stable but tenuous condition to home.  Discharge Instructions    AMB Referral to [facility] Management   Complete by:  As directed    Please assign HTA member to [facility] for transition of care. Please contact Aariv Medlock (mom) at (cell) [phone] or (home) [phone]. Written consent obtained. Unplanned readmission score of 25% (high). Was previously followed by [facility] Coordinator. Please assess need for  [facility] LCSW due to going home and not SNF. Please call with questions. Currently at [facility]. Likely discharge on 10/11/17. Thanks. Marthenia Rolling,  MSN-Ed, RN,BSN-THN [facility] [id number]-[phone]   Reason for consult:  Please assign to [facility] RNCM   Diagnoses of:  Heart Failure   Expected date of contact:  1-3 days (reserved for hospital discharges)   For home use only DME oxygen   Complete by:  As directed    Mode or (Route):  Nasal cannula   Liters per Minute:  10   Frequency:  Continuous (stationary and portable oxygen unit needed)   Oxygen conserving device:  Yes   Oxygen delivery system:  Gas     Follow-up Information    [name], MD Follow up on 10/21/2017.   Specialty:  Cardiology Why:  Heart Failure Followup-10:00-parking at [facility] ER lot (enter under blue "Specialty Clinics" awning) or under [place] on [facility] [address], materials entrance, garage code: 1300, elevator 1st floor). Take all am meds, bring all med bottles. Contact information: [place] [address] [phone]             Duration of Discharge Encounter: Greater than 35 minutes   Signed, Shirley Friar, PA-C 10/13/2017, 1:24 PM

## 2017-10-10 NOTE — Progress Notes (Signed)
SATURATION QUALIFICATIONS: (This note is used to comply with regulatory documentation for home oxygen)  Patient Saturations on Room Air at Rest = 74%  Patient Saturations on Room Air while Ambulating    Patient Saturations on  Liters of oxygen while Ambulating   Please briefly explain why patient needs home oxygen: Patient required HFNC at 10LPM to maintain a saturation of 92% at rest, in order to maintain adequate tissue oxygenation patient will need HFNC at 10LPM at home.

## 2017-10-10 NOTE — Progress Notes (Signed)
Patient ambulated 200 ft in the hall on HFNC 15LPM, after walking about half way his O2 sat dropped below 88% , patient instructed to do deep breathing, upon arrival to the room , patient 02 sat dropped further , RT notified, instructions given to place patient on a non re breather mask, O2 now 99%, awaiting RT staff, will continue to monitor.

## 2017-10-10 NOTE — Consult Note (Signed)
   [facility] CM Inpatient Consult   10/10/2017  JENIEL SLAUSON Dec 15, 1960 [id number]   [facility] Care Management follow up.  Went to bedside to speak with patient and patient's mom about [facility] Care Management program. However, upon entering patient room, both patient and mom were sleeping soundly. Will come back at later time.   Made inpatient RNCM aware bedside attempt to speak with Mr. Interrante and family.    Marthenia Rolling, MSN-Ed, RN,BSN [facility] Liaison [phone]

## 2017-10-11 LAB — CBC WITH DIFFERENTIAL/PLATELET
Basophils Absolute: 0 10*3/uL (ref 0.0–0.1)
Basophils Relative: 0 %
EOS PCT: 2 %
Eosinophils Absolute: 0.1 10*3/uL (ref 0.0–0.7)
HCT: 24.1 % — ABNORMAL LOW (ref 39.0–52.0)
Hemoglobin: 6.6 g/dL — CL (ref 13.0–17.0)
Lymphocytes Relative: 6 %
Lymphs Abs: 0.4 10*3/uL — ABNORMAL LOW (ref 0.7–4.0)
MCH: 22.8 pg — AB (ref 26.0–34.0)
MCHC: 27.4 g/dL — ABNORMAL LOW (ref 30.0–36.0)
MCV: 83.1 fL (ref 78.0–100.0)
MONO ABS: 0.3 10*3/uL (ref 0.1–1.0)
Monocytes Relative: 4 %
NEUTROS PCT: 88 %
Neutro Abs: 6 10*3/uL (ref 1.7–7.7)
PLATELETS: 111 10*3/uL — AB (ref 150–400)
RBC: 2.9 MIL/uL — AB (ref 4.22–5.81)
RDW: 27.9 % — AB (ref 11.5–15.5)
WBC: 6.8 10*3/uL (ref 4.0–10.5)

## 2017-10-11 LAB — COOXEMETRY PANEL
CARBOXYHEMOGLOBIN: 2.7 % — AB (ref 0.5–1.5)
METHEMOGLOBIN: 1.6 % — AB (ref 0.0–1.5)
O2 SAT: 73.9 %
TOTAL HEMOGLOBIN: 8.1 g/dL — AB (ref 12.0–16.0)

## 2017-10-11 LAB — BASIC METABOLIC PANEL
Anion gap: 8 (ref 5–15)
BUN: 29 mg/dL — ABNORMAL HIGH (ref 6–20)
CALCIUM: 8.6 mg/dL — AB (ref 8.9–10.3)
CHLORIDE: 91 mmol/L — AB (ref 101–111)
CO2: 40 mmol/L — ABNORMAL HIGH (ref 22–32)
CREATININE: 1.63 mg/dL — AB (ref 0.61–1.24)
GFR calc non Af Amer: 46 mL/min — ABNORMAL LOW (ref >60)
GFR, EST AFRICAN AMERICAN: 53 mL/min — AB (ref >60)
Glucose, Bld: 115 mg/dL — ABNORMAL HIGH (ref 65–99)
Potassium: 3.6 mmol/L (ref 3.5–5.1)
SODIUM: 139 mmol/L (ref 135–145)

## 2017-10-11 LAB — GLUCOSE, CAPILLARY
GLUCOSE-CAPILLARY: 172 mg/dL — AB (ref 65–99)
Glucose-Capillary: 123 mg/dL — ABNORMAL HIGH (ref 65–99)
Glucose-Capillary: 155 mg/dL — ABNORMAL HIGH (ref 65–99)
Glucose-Capillary: 171 mg/dL — ABNORMAL HIGH (ref 65–99)

## 2017-10-11 LAB — PREPARE RBC (CROSSMATCH)

## 2017-10-11 MED ORDER — SILDENAFIL CITRATE 20 MG PO TABS
60.0000 mg | ORAL_TABLET | Freq: Three times a day (TID) | ORAL | Status: DC
  Administered 2017-10-11 – 2017-10-13 (×9): 60 mg via ORAL
  Filled 2017-10-11 (×9): qty 3

## 2017-10-11 MED ORDER — FUROSEMIDE 10 MG/ML IJ SOLN
80.0000 mg | Freq: Once | INTRAMUSCULAR | Status: AC
  Administered 2017-10-11: 80 mg via INTRAVENOUS
  Filled 2017-10-11: qty 8

## 2017-10-11 MED ORDER — SODIUM CHLORIDE 0.9 % IV SOLN
INTRAVENOUS | Status: DC
  Administered 2017-10-11: 21:00:00 via INTRAVENOUS

## 2017-10-11 MED ORDER — SODIUM CHLORIDE 0.9 % IV SOLN
Freq: Once | INTRAVENOUS | Status: AC
  Administered 2017-10-11: 13:00:00 via INTRAVENOUS

## 2017-10-11 NOTE — Progress Notes (Signed)
CRITICAL VALUE ALERT  Critical Value: Hgb 6.6  Date & Time Notied:  10/11/17 @ 6:11a  Provider Notified: Dr. Debara Pickett (cardiology) notified  Orders Received/Actions taken: no new orders given; will address this morning

## 2017-10-11 NOTE — Progress Notes (Addendum)
Patient ID: Tony Jones, male   DOB: 1960-11-21, 57 y.o.   MRN: [id number]     Advanced Heart Failure Rounding Note  PCP-Cardiologist: No primary care provider on file.   Subjective:    EGD 10/04/17 cancelled due to hypoxia. Hgb remains stable this morning without overt bleeding.   Hgb 6.6 this am. No BRBPR or melena. + Hematuria but thought 2/2 to traumatic foley. Denies SOB, though continues to de-saturate into 70-80s with attempts to decrease oxygen. Last night he de-saturated into the 80s on 15 lpm via [place] with ambulation.  Mother very frustrated this morning about apparent lack of progress and pt being turned down for SNF.   High res Chest CT 10/01/17: Limited evaluation for ILD because of presence of pulmonary edema. The RV and PA were dilated.  There was extensive coronary calcification.   Abd [place] 10/02/17: Cholelithiasis without evidence for cholecystitis. Hepatic steatosis.  Small ascites.   RHC/LHC (6/5):  Coronary Findings  Diagnostic  Dominance: Right  Left Main  No left main, separate ostia LAD and LCx.  Left Anterior Descending  Separate ostium of LAD. LAD has luminal irregularities.  Left Circumflex  Separate ostium of LCx. Luminal irregularities.  Right Coronary Artery  Large caliber RCA with luminal irregularities. 50% stenosis ostial PDA.  Intervention   No interventions have been documented.  Right Heart   Right Heart Pressures    RHC Procedural Findings: Hemodynamics (mmHg) RA 16 RV 92/16 PA 93/51 mean 67 PCWP mean 27 LV 110/21 AO 115/75  Oxygen saturations: PA 56% AO 99%  Cardiac Output (Fick) 4.95  Cardiac Index (Fick) 2.65 PVR 8 WU PAPi 2.65    Objective:   Weight Range: 196 lb 4.8 oz (89 kg) Body mass index is 37.09 kg/m.   Vital Signs:   Temp:  [97.6 F (36.4 C)-99.1 F (37.3 C)] 98.8 F (37.1 C) (06/11 0409) Pulse Rate:  [97-103] 103 (06/11 0858) Resp:  [19-30] 19 (06/11 0008) BP: (100-110)/(58-68) 100/58 (06/11 0008) SpO2:  [78  %-100 %] 93 % (06/11 0858) FiO2 (%):  [55 %] 55 % (06/10 1932) Weight:  [196 lb 4.8 oz (89 kg)] 196 lb 4.8 oz (89 kg) (06/11 0409) Last BM Date: 10/09/17  Weight change: Filed Weights   10/09/17 1500 10/10/17 0539 10/11/17 0409  Weight: 194 lb 11.2 oz (88.3 kg) 196 lb 3.4 oz (89 kg) 196 lb 4.8 oz (89 kg)   Intake/Output:   Intake/Output Summary (Last 24 hours) at 10/11/2017 0955 Last data filed at 10/11/2017 0900 Gross per 24 hour  Intake 603 ml  Output 1075 ml  Net -472 ml    Physical Exam   General: NAD.  HEENT: Normal. On HFNC at 15 lpm currently.  Neck: Supple. JVP difficult. Carotids 2+ bilat; no bruits. No thyromegaly or nodule noted. Cor: PMI nondisplaced. RRR, No M/G/R noted Lungs: CTAB, normal effort. Abdomen: Soft, non-tender, non-distended, no HSM. No bruits or masses. +BS  Extremities: No cyanosis, clubbing, or rash. R and LLE no edema.  Neuro: Alert & orientedx3, cranial nerves grossly intact. moves all 4 extremities w/o difficulty. Affect pleasant   Telemetry   Sinus tach 100s, personally reviewed.   Labs    CBC Recent Labs    10/10/17 0510 10/11/17 0422  WBC 7.5 6.8  NEUTROABS 6.5 6.0  HGB 7.9* 6.6*  HCT 29.2* 24.1*  MCV 81.8 83.1  PLT 130* 948*   Basic Metabolic Panel Recent Labs    10/10/17 0510 10/11/17 0422  NA  141 139  K 4.1 3.6  CL 93* 91*  CO2 43* 40*  GLUCOSE 124* 115*  BUN 28* 29*  CREATININE 1.54* 1.63*  CALCIUM 8.8* 8.6*   Liver Function Tests No results for input(s): AST, ALT, ALKPHOS, BILITOT, PROT, ALBUMIN in the last 72 hours. No results for input(s): LIPASE, AMYLASE in the last 72 hours. Cardiac Enzymes No results for input(s): CKTOTAL, CKMB, CKMBINDEX, TROPONINI in the last 72 hours.  BNP: BNP (last 3 results) Recent Labs    07/30/17 0950 09/30/17 2317  BNP 290.8* 928.6*    ProBNP (last 3 results) No results for input(s): PROBNP in the last 8760 hours.   D-Dimer No results for input(s): DDIMER in the  last 72 hours. Hemoglobin A1C No results for input(s): HGBA1C in the last 72 hours. Fasting Lipid Panel No results for input(s): CHOL, HDL, LDLCALC, TRIG, CHOLHDL, LDLDIRECT in the last 72 hours. Thyroid Function Tests No results for input(s): TSH, T4TOTAL, T3FREE, THYROIDAB in the last 72 hours.  Invalid input(s): FREET3  Other results:   Imaging    No results found.   Medications:     Scheduled Medications: . allopurinol  300 mg Oral Q supper  . atorvastatin  40 mg Oral q1800  . fesoterodine  8 mg Oral Q supper  . furosemide  80 mg Intravenous Once  . heparin  5,000 Units Subcutaneous Q8H  . insulin aspart  0-9 Units Subcutaneous TID WC  . lidocaine  1 application Urethral Once  . midodrine  10 mg Oral TID WC  . multivitamin with minerals  1 tablet Oral Q breakfast  . oxybutynin  5 mg Oral BID WC  . pantoprazole  40 mg Oral BID WC  . sertraline  100 mg Oral Q breakfast  . sildenafil  60 mg Oral TID  . sodium chloride flush  3 mL Intravenous Q12H  . sodium chloride flush  3 mL Intravenous Q12H  . torsemide  60 mg Oral Daily    Infusions: . sodium chloride    . sodium chloride    . sodium chloride      PRN Medications: sodium chloride, sodium chloride, acetaminophen, docusate sodium, magnesium hydroxide, ondansetron (ZOFRAN) IV, ondansetron, sodium chloride flush, sodium chloride flush, sodium chloride flush   Assessment/Plan   1. RV failure/pulmonary HTN:  Suspect the patient has severe RV failure in the setting of severe pulmonary hypertension. Echo showed flattened septum and severe pulmonary hypertension. On CT chest RV and PA were dilated. I strongly suspect group 3 PH from chronic hypoxemia, probably OHS/OSA.  He has not had a sleep study to diagnosis OSA but it is strongly suspected.  V/Q scan in 2016 did not suggest chronic PE.  RHC showed severely elevated PA pressure yesterday with preserved PAPi and elevated RA pressure and PCWP.  Cannot rule out group  1 PH contribution with markedly high PA pressure.  - Continue to encouraged to try CPAP with nasal pillows.  Family say he will not be able to tolerate.  - Continue torsemide 60 mg daily.  - Continue sildenafil 40 mg TID.  - Keep oxygen saturation up with nasal cannula/HFNC.  Meets O2 requirement, but worry 10 lpm will not suffice for him at home.  - Continue midodrine 10 mg tid to maintain BP, suspect hypotension is due to RV failure..  2. Chronic hypoxemic respiratory failure: As above suspect OHS/OSA.  High resolution chest CT was limited by pulmonary edema but did not appear to show interstitial lung disease.  PCCM agrees. - CCM has seen and ordered CPAP QHS, will have him try it with nasal pillows tonight. Recommend outpatient PFTs.  - HFNC requiring flows up to 15 lpm. Apparently he can get up to 10 lpm at home. Ideally would have him stable on this prior to d/c.  - Continue to encouraged to try CPAP.  3. CKD: Stage 3.   - Creatinine 1.63 this am. Follow.  4. Cholelithiasis: Abdomen is not tender today and he has been eating meals.  Has had symptomatic cholelithiasis and cholecystectomy recommended. RUQ [place] 6/1, cholelithiasis with no evidence for active cholecystitis.    - Will eventually need gallbladder addressed but not urgent at this time.  - No change to current plan.   5. Anemia: Chronic, with Fe deficiency. He had a dose of IV Fe.  Most recently, had UGIB in 3/19 with EGD showing no definite cause for bleeding (other than gastritis). Occult stool positive. EGD 10/04/17 cancelled due to hypoxia. Hemoglobin is stable currently.  - Continue BID PPI.  - Hgb down to 6.6 this am with no definite bleeding. Will supp 2 units and have GI see.  Perhaps can prep him and give Capsule? 6. Developmental delay - Mother at bedside to assist communication. - No change to current plan.   7. Undefined muscular dystrophy:  - Continue to walk in hall with PT.  - Will attempt to increase frequency of  ambulation.  8. CAD: Strong family history of premature coronary disease.  He had extensive coronary calcification on CT chest this admission.  He has epigastric discomfort that has been attributed to symptomatic cholelithiasis, but given extensive coronary calcification, was concerned it could actually be angina. Coronary angiography 10/05/17 showed nonobstructive disease.  - No s/s of ischemia.    - Continue statin.  - No ASA yet with low Hgb and GI bleed.   Length of Stay: [place], [place]  10/11/2017, 9:55 AM  Advanced Heart Failure Team Pager [phone] (M-F; 7a - 4p)  Please contact [facility] for night-coverage after hours (4p -7a ) and weekends on [email or web address]  Patient seen with PA, agree with the above note.  Hemoglobin lower today at 6.6 without overt bleeding.  He also is still requiring high flow oxygen.  He can get 10 L/min at home, this may be the best we can do.   He does not appear volume overloaded on exam today, he will not qualify for SNF so we are preparing him to go home.   Today, will give 2 units PRBCs with Lasix 80 mg IV between units.  Will ask GI to re-evaluate.    He will continue torsemide 60 mg daily and increase sildenafil to 60 mg tid.  Ultimately, need to correct hypoxemia to improve his pulmonary hypertension and RV failure. He can get oxygen up to 10L/min at home.  It does not appear that he will be able to tolerate CPAP.   Hopefully home tomorrow.   Loralie Champagne 10/11/2017 11:22 AM

## 2017-10-11 NOTE — Progress Notes (Signed)
Occupational Therapy Treatment Patient Details Name: Tony Jones MRN: [id number] DOB: 1960-08-20 Today's Date: 10/11/2017    History of present illness Pt is a 57 y.o. male with complex history developmental delay, Muscular dystrophy, chronic hypoxia with witnessed apnea (would not be tolerant of CPAP so uses O2), Barrett's esophagus, anemia, anxiety, cholelithasis, nephrolithiasis, DM, dyslipidemia, GERD, HTN, pulmonary HTN and right heart failure who was admitted directly to the hospital for further evaluation of recent malaise and failure to thrive. He does very little walking, but he has been SOB with minimal exertion.   OT comments  Pt continues to present with decreased activity tolerance. Pt fatigues and drowsy throughout session; noted low hgb (RN confirming pt ok for therapy) and mother reporting pt did not sleep well last night. Pt agreeable to therapy and participating in functional tasks in standing at sink. Pt donning shoes with Min A and performing functional mobility to sink with Min Guard A. Pt maintaining standing balance during functional tasks at sink for ~10 minutes. SpO2 ranging from 84-95% on 15L O2 high flow. Continue to recommend post-acute rehab and will continue to follow acutely as admitted.    Follow Up Recommendations  SNF;Supervision/Assistance - 24 hour    Equipment Recommendations  None recommended by OT    Recommendations for Other Services      Precautions / Restrictions Precautions Precautions: Other (comment) Precaution Comments: has low SpO2 but not syptomatic Restrictions Weight Bearing Restrictions: No       Mobility Bed Mobility Overal bed mobility: Needs Assistance Bed Mobility: Supine to Sit     Supine to sit: Supervision;HOB elevated     General bed mobility comments: supervision for safety  Transfers Overall transfer level: Needs assistance   Transfers: Sit to/from Stand Sit to Stand: Min guard         General transfer  comment: Min Guard for safety    Balance Overall balance assessment: Mild deficits observed, not formally tested                                         ADL either performed or assessed with clinical judgement   ADL Overall ADL's : Needs assistance/impaired     Grooming: Wash/dry hands;Min guard;Standing Grooming Details (indicate cue type and reason): Pt washing his hands with VCs for initation and to turn off water. Min Guard for safety in standing             Lower Body Dressing: Minimal assistance;Sit to/from stand Lower Body Dressing Details (indicate cue type and reason): Min A for donning slip on shoes at EOB. Pt able to doff shoes at EOB             Functional mobility during ADLs: Min guard General ADL Comments: Pt performing ADLs and funcitonal mobility at a [employer] A  for balance. Mod cues for sequencing and performance of ADLs. Providing mother with handout and educaiton on [facility] [place] for carry over to home to increase independence. Mother verbalizing understanding.      Vision       Perception     Praxis      Cognition Arousal/Alertness: Awake/alert;Lethargic Behavior During Therapy: WFL for tasks assessed/performed Overall Cognitive Status: History of cognitive impairments - at baseline  General Comments: Pt stating " I can't keep my eyes open." Presenting with drowsiness and closing his eyes during therapy session. Baseline cognition at 57 years old. Requiring Mod cues for squencing. Requiring calm and simple environment to increase occupational performance and particiaption.         Exercises Exercises: Other exercises Other Exercises Other Exercises: Challenging pt standing tolerance and BUE strength while playing tic-tac-toe at sink. Put shaving cream on mirror and having pt play tic-tac-toe and then clean mirror with wet and dry wash cloth. Pt fatiguing after ~10 minutes. Pt enjoying  activity. Maintained standing balance for functional tasks for approx 10 min   Shoulder Instructions       General Comments Mother present throughout session. SpO2 ranging between 84-95% on 15L high flow. HR 104-108.    Pertinent Vitals/ Pain       Pain Assessment: Faces Faces Pain Scale: No hurt Pain Intervention(s): Monitored during session  Home Living                                          Prior Functioning/Environment              Frequency  Min 2X/week        Progress Toward Goals  OT Goals(current goals can now be found in the care plan section)  Progress towards OT goals: Progressing toward goals  Acute Rehab OT Goals Patient Stated Goal: to go home OT Goal Formulation: With patient/family Time For Goal Achievement: 10/16/17 Potential to Achieve Goals: Good ADL Goals Pt Will Perform Upper Body Dressing: with modified independence;sitting Pt Will Perform Lower Body Dressing: with modified independence;sit to/from stand Pt Will Transfer to Toilet: with modified independence;ambulating;regular height toilet Pt Will Perform Toileting - Clothing Manipulation and hygiene: with modified independence;sit to/from stand Additional ADL Goal #1: Pt/mother with recall and implement 3 ways of conserving energy during ADL session.  Plan Discharge plan remains appropriate    Co-evaluation                 AM-PAC PT "6 Clicks" Daily Activity     Outcome Measure   Help from another person eating meals?: None Help from another person taking care of personal grooming?: A Lot Help from another person toileting, which includes using toliet, bedpan, or urinal?: A Lot Help from another person bathing (including washing, rinsing, drying)?: A Lot Help from another person to put on and taking off regular upper body clothing?: None Help from another person to put on and taking off regular lower body clothing?: A Lot 6 Click Score: 16    End of  Session Equipment Utilized During Treatment: Oxygen(15 L high flow)  OT Visit Diagnosis: History of falling (Z91.81);Muscle weakness (generalized) (M62.81);Other (comment)   Activity Tolerance Patient tolerated treatment well   Patient Left in chair;with call bell/phone within reach;with family/visitor present   Nurse Communication Mobility status        Time: 1017-5102 OT Time Calculation (min): 36 min  Charges: OT General Charges $OT Visit: 1 Visit OT Treatments $Self Care/Home Management : 8-22 mins $Therapeutic Activity: 8-22 mins  [place], OTR/L Acute Rehab Pager: [phone] Office: [place] 10/11/2017, 11:00 AM

## 2017-10-11 NOTE — Progress Notes (Signed)
PT Cancellation Note  Patient Details Name: Tony Jones MRN: [id number] DOB: 1960/08/27   Cancelled Treatment:    Reason Eval/Treat Not Completed: Other (comment). Pt declined ambulation. Reports fatigue and receiving blood.   [place] 10/11/2017, 3:13 PM [place]

## 2017-10-11 NOTE — Plan of Care (Signed)
  Problem: Activity: Goal: Risk for activity intolerance will decrease Outcome: Progressing  Patient's family concerned about his ability to increase activity in order to increase health. Discussed simple forms of exercise that could be done around the house to increase physical health such as walking the main hall of the house and using hand weights for strength exercises.

## 2017-10-11 NOTE — Progress Notes (Signed)
[name] 2:30 PM  Subjective: Patient seen and examined and case discussed with his wife and the cardiology team and he is well-known to me from both outpatient and inpatient visits and other than some constipation and is not having any GI complaints and his main complaint right now is leg pain and his last bowel movement was Saturday which was not black according to the mother  Objective: Vital signs stable afebrile lying comfortably in the bed with oxygen in his nose abdomen is soft nontender rare bowel soundslabs reviewed BUN and creatinine stable throughout hospital stayslight drop today from baseline of hemoglobinx-rays reviewed iron deficiency  Assessment: Iron deficiencyAnemia questionable etiology nondiagnostic Endo and CAT scan not on ago  Plan: I discussed  Capsule endoscopy with the Patient and His mother and will proceed tomorrow with further workup and plans pending those findings and as an aside he might need daily MiraLAX added to his medicine list to assist with more frequent bowel movements  Jessamine Barcia E  Pager [phone] After 5PM or if no answer call [phone]

## 2017-10-12 ENCOUNTER — Encounter (HOSPITAL_COMMUNITY)
Admission: AD | Disposition: A | Discharge status: Home / Self Care | Payer: Self-pay | Source: Ambulatory Visit | Attending: Cardiology

## 2017-10-12 ENCOUNTER — Encounter (HOSPITAL_COMMUNITY): Payer: Self-pay

## 2017-10-12 HISTORY — PX: GIVENS CAPSULE STUDY: SHX5432

## 2017-10-12 LAB — BASIC METABOLIC PANEL
Anion gap: 11 (ref 5–15)
BUN: 24 mg/dL — AB (ref 6–20)
CHLORIDE: 92 mmol/L — AB (ref 101–111)
CO2: 37 mmol/L — AB (ref 22–32)
Calcium: 8.7 mg/dL — ABNORMAL LOW (ref 8.9–10.3)
Creatinine, Ser: 1.68 mg/dL — ABNORMAL HIGH (ref 0.61–1.24)
GFR calc Af Amer: 51 mL/min — ABNORMAL LOW (ref >60)
GFR calc non Af Amer: 44 mL/min — ABNORMAL LOW (ref >60)
GLUCOSE: 121 mg/dL — AB (ref 65–99)
POTASSIUM: 3.4 mmol/L — AB (ref 3.5–5.1)
Sodium: 140 mmol/L (ref 135–145)

## 2017-10-12 LAB — CBC WITH DIFFERENTIAL/PLATELET
BASOS PCT: 0 %
Basophils Absolute: 0 10*3/uL (ref 0.0–0.1)
EOS PCT: 2 %
Eosinophils Absolute: 0.2 10*3/uL (ref 0.0–0.7)
HEMATOCRIT: 34.4 % — AB (ref 39.0–52.0)
HEMOGLOBIN: 9.8 g/dL — AB (ref 13.0–17.0)
Lymphocytes Relative: 7 %
Lymphs Abs: 0.6 10*3/uL — ABNORMAL LOW (ref 0.7–4.0)
MCH: 24.3 pg — AB (ref 26.0–34.0)
MCHC: 28.5 g/dL — ABNORMAL LOW (ref 30.0–36.0)
MCV: 85.1 fL (ref 78.0–100.0)
MONO ABS: 0.3 10*3/uL (ref 0.1–1.0)
MONOS PCT: 4 %
Neutro Abs: 7.5 10*3/uL (ref 1.7–7.7)
Neutrophils Relative %: 87 %
PLATELETS: 136 10*3/uL — AB (ref 150–400)
RBC: 4.04 MIL/uL — ABNORMAL LOW (ref 4.22–5.81)
RDW: 26.9 % — ABNORMAL HIGH (ref 11.5–15.5)
WBC: 8.6 10*3/uL (ref 4.0–10.5)

## 2017-10-12 LAB — TYPE AND SCREEN
ABO/RH(D): O POS
Antibody Screen: NEGATIVE
Unit division: 0
Unit division: 0

## 2017-10-12 LAB — GLUCOSE, CAPILLARY
GLUCOSE-CAPILLARY: 196 mg/dL — AB (ref 65–99)
Glucose-Capillary: 127 mg/dL — ABNORMAL HIGH (ref 65–99)
Glucose-Capillary: 111 mg/dL — ABNORMAL HIGH (ref 65–99)

## 2017-10-12 LAB — BPAM RBC
BLOOD PRODUCT EXPIRATION DATE: 201907092359
Blood Product Expiration Date: 201907092359
ISSUE DATE / TIME: 201906111229
ISSUE DATE / TIME: 201906111537
UNIT TYPE AND RH: 5100
Unit Type and Rh: 5100

## 2017-10-12 LAB — COOXEMETRY PANEL
Carboxyhemoglobin: 1.6 % — ABNORMAL HIGH (ref 0.5–1.5)
Methemoglobin: 1.6 % — ABNORMAL HIGH (ref 0.0–1.5)
O2 SAT: 53.5 %
Total hemoglobin: 9.9 g/dL — ABNORMAL LOW (ref 12.0–16.0)

## 2017-10-12 SURGERY — IMAGING PROCEDURE, GI TRACT, INTRALUMINAL, VIA CAPSULE
Anesthesia: LOCAL

## 2017-10-12 SURGERY — Surgical Case
Anesthesia: *Unknown

## 2017-10-12 MED ORDER — POTASSIUM CHLORIDE CRYS ER 20 MEQ PO TBCR
20.0000 meq | EXTENDED_RELEASE_TABLET | Freq: Every day | ORAL | Status: DC
  Administered 2017-10-12 – 2017-10-13 (×2): 20 meq via ORAL
  Filled 2017-10-12 (×2): qty 1

## 2017-10-12 MED ORDER — FUROSEMIDE 10 MG/ML IJ SOLN
80.0000 mg | Freq: Two times a day (BID) | INTRAMUSCULAR | Status: AC
  Administered 2017-10-12 – 2017-10-13 (×3): 80 mg via INTRAVENOUS
  Filled 2017-10-12 (×3): qty 8

## 2017-10-12 MED ORDER — LINACLOTIDE 72 MCG PO CAPS
72.0000 ug | ORAL_CAPSULE | Freq: Every day | ORAL | Status: DC
  Administered 2017-10-13: 72 ug via ORAL
  Filled 2017-10-12 (×2): qty 1

## 2017-10-12 SURGICAL SUPPLY — 1 items: TOWEL COTTON PACK 4EA (MISCELLANEOUS) ×4 IMPLANT

## 2017-10-12 NOTE — Progress Notes (Signed)
Patient ambulated about 144ft in the hall, using a walker, ambulation well tolerated.

## 2017-10-12 NOTE — Progress Notes (Signed)
Patient switched to HFNC at 10LPM O2 sat is 100%.

## 2017-10-12 NOTE — Progress Notes (Signed)
Patient undergoing capsule study at bedside, monitor attached to patient not showing the blue or yellow light, unable to reach endo on the phone, this RN went to endo dept to see if there's a staff to help check the monitor, there was no one found on the unit, charge RN notified, will continue to monitor patient.

## 2017-10-12 NOTE — Progress Notes (Addendum)
Patient ID: Tony Jones, male   DOB: 11-Mar-1961, 57 y.o.   MRN: [id number]     Advanced Heart Failure Rounding Note  PCP-Cardiologist: No primary care provider on file.   Subjective:    EGD 10/04/17 cancelled due to hypoxia. Hgb remains stable this morning without overt bleeding.   Hgb up to 9.4 this am from 6.6 after 2uPRBCs.   GI has seen and plans Capsule endo today.   Denies SOB. Continues to require O2 mask intermittently with O2 up to 15 lpm.  Mother frustrated about length of stay.   High res Chest CT 10/01/17: Limited evaluation for ILD because of presence of pulmonary edema. The RV and PA were dilated.  There was extensive coronary calcification.   Abd [place] 10/02/17: Cholelithiasis without evidence for cholecystitis. Hepatic steatosis.  Small ascites.   RHC/LHC (6/5):  Coronary Findings  Diagnostic  Dominance: Right  Left Main  No left main, separate ostia LAD and LCx.  Left Anterior Descending  Separate ostium of LAD. LAD has luminal irregularities.  Left Circumflex  Separate ostium of LCx. Luminal irregularities.  Right Coronary Artery  Large caliber RCA with luminal irregularities. 50% stenosis ostial PDA.  Intervention   No interventions have been documented.  Right Heart   Right Heart Pressures    RHC Procedural Findings: Hemodynamics (mmHg) RA 16 RV 92/16 PA 93/51 mean 67 PCWP mean 27 LV 110/21 AO 115/75  Oxygen saturations: PA 56% AO 99%  Cardiac Output (Fick) 4.95  Cardiac Index (Fick) 2.65 PVR 8 WU PAPi 2.65    Objective:   Weight Range: 197 lb 1.6 oz (89.4 kg) Body mass index is 37.24 kg/m.   Vital Signs:   Temp:  [97.7 F (36.5 C)-98.8 F (37.1 C)] 97.7 F (36.5 C) (06/12 0442) Pulse Rate:  [93-108] 108 (06/12 0442) Resp:  [19-28] 23 (06/12 0442) BP: (91-114)/(56-77) 112/69 (06/12 0442) SpO2:  [63 %-96 %] 63 % (06/12 0442) Weight:  [197 lb 1.6 oz (89.4 kg)] 197 lb 1.6 oz (89.4 kg) (06/12 0726) Last BM Date: 10/09/17  Weight  change: Filed Weights   10/10/17 0539 10/11/17 0409 10/12/17 0726  Weight: 196 lb 3.4 oz (89 kg) 196 lb 4.8 oz (89 kg) 197 lb 1.6 oz (89.4 kg)   Intake/Output:   Intake/Output Summary (Last 24 hours) at 10/12/2017 0757 Last data filed at 10/12/2017 0443 Gross per 24 hour  Intake 1238 ml  Output 2745 ml  Net -1507 ml    Physical Exam   General: NAD  HEENT: Normal Neck: Supple. JVP difficult due to body habitus. Carotids 2+ bilat; no bruits. No thyromegaly or nodule noted. Cor: PMI nondisplaced. RRR, No M/G/R noted Lungs: CTAB, normal effort. Abdomen: Soft, non-tender, non-distended, no HSM. No bruits or masses. +BS  Extremities: No cyanosis, clubbing, or rash. R and LLE no edema.  Neuro: Alert & orientedx3, cranial nerves grossly intact. moves all 4 extremities w/o difficulty. Affect pleasant   Telemetry   Sinus tach, 90-100s, personally reviewed.   Labs    CBC Recent Labs    10/11/17 0422 10/12/17 0437  WBC 6.8 8.6  NEUTROABS 6.0 7.5  HGB 6.6* 9.8*  HCT 24.1* 34.4*  MCV 83.1 85.1  PLT 111* 751*   Basic Metabolic Panel Recent Labs    10/11/17 0422 10/12/17 0437  NA 139 140  K 3.6 3.4*  CL 91* 92*  CO2 40* 37*  GLUCOSE 115* 121*  BUN 29* 24*  CREATININE 1.63* 1.68*  CALCIUM 8.6* 8.7*  Liver Function Tests No results for input(s): AST, ALT, ALKPHOS, BILITOT, PROT, ALBUMIN in the last 72 hours. No results for input(s): LIPASE, AMYLASE in the last 72 hours. Cardiac Enzymes No results for input(s): CKTOTAL, CKMB, CKMBINDEX, TROPONINI in the last 72 hours.  BNP: BNP (last 3 results) Recent Labs    07/30/17 0950 09/30/17 2317  BNP 290.8* 928.6*    ProBNP (last 3 results) No results for input(s): PROBNP in the last 8760 hours.   D-Dimer No results for input(s): DDIMER in the last 72 hours. Hemoglobin A1C No results for input(s): HGBA1C in the last 72 hours. Fasting Lipid Panel No results for input(s): CHOL, HDL, LDLCALC, TRIG, CHOLHDL,  LDLDIRECT in the last 72 hours. Thyroid Function Tests No results for input(s): TSH, T4TOTAL, T3FREE, THYROIDAB in the last 72 hours.  Invalid input(s): FREET3  Other results:   Imaging    No results found.   Medications:     Scheduled Medications: . allopurinol  300 mg Oral Q supper  . atorvastatin  40 mg Oral q1800  . fesoterodine  8 mg Oral Q supper  . insulin aspart  0-9 Units Subcutaneous TID WC  . lidocaine  1 application Urethral Once  . midodrine  10 mg Oral TID WC  . multivitamin with minerals  1 tablet Oral Q breakfast  . oxybutynin  5 mg Oral BID WC  . pantoprazole  40 mg Oral BID WC  . sertraline  100 mg Oral Q breakfast  . sildenafil  60 mg Oral TID  . sodium chloride flush  3 mL Intravenous Q12H  . sodium chloride flush  3 mL Intravenous Q12H  . torsemide  60 mg Oral Daily    Infusions: . sodium chloride    . sodium chloride    . sodium chloride 20 mL/hr at 10/11/17 2045    PRN Medications: sodium chloride, sodium chloride, acetaminophen, docusate sodium, magnesium hydroxide, ondansetron (ZOFRAN) IV, ondansetron, sodium chloride flush, sodium chloride flush, sodium chloride flush   Assessment/Plan   1. RV failure/pulmonary HTN:  Suspect the patient has severe RV failure in the setting of severe pulmonary hypertension. Echo showed flattened septum and severe pulmonary hypertension. On CT chest RV and PA were dilated. I strongly suspect group 3 PH from chronic hypoxemia, probably OHS/OSA.  He has not had a sleep study to diagnosis OSA but it is strongly suspected.  V/Q scan in 2016 did not suggest chronic PE.  RHC showed severely elevated PA pressure yesterday with preserved PAPi and elevated RA pressure and PCWP.  Cannot rule out group 1 PH contribution with markedly high PA pressure.  - He will not tolerate CPAP.  - Continue torsemide 60 mg daily.  - Continue sildenafil 60 mg TID.  - Keep oxygen saturation up with nasal cannula/HFNC.  Meets O2  requirement, but worry 10 lpm will not suffice for him at home.  - Continue midodrine 10 mg tid to maintain BP, suspect hypotension is due to RV failure..  2. Chronic hypoxemic respiratory failure: As above suspect OHS/OSA.  High resolution chest CT was limited by pulmonary edema but did not appear to show interstitial lung disease.  PCCM agrees. - CCM has seen and ordered CPAP QHS, will have him try it with nasal pillows tonight. Recommend outpatient PFTs.  - HFNC requiring flows up to 15 lpm. He can get up to 10 lpm at home. Orders sent.  - Unfortunately pt is completely intolerant of CPAP.  3. CKD: Stage 3.   -  Creatinine stable at 1.68.  4. Cholelithiasis: Abdomen is not tender today and he has been eating meals.  Has had symptomatic cholelithiasis and cholecystectomy recommended. RUQ [place] 6/1, cholelithiasis with no evidence for active cholecystitis.    - Will eventually need gallbladder addressed but not urgent at this time.  - No change to current plan.   5. Anemia: Chronic, with Fe deficiency. He had a dose of IV Fe.  Most recently, had UGIB in 3/19 with EGD showing no definite cause for bleeding (other than gastritis). Occult stool positive. EGD 10/04/17 cancelled due to hypoxia. Hemoglobin is stable currently.  - Continue BID PPI.  - Hgb 6.6 -> 2 uPRBC -> 9.4 - GI has seen and plans potential Capsule Endo today.  6. Developmental delay - Mother at bedside to assist communication. - No change to current plan.   7. Undefined muscular dystrophy:  - Continue to work with PT. Will need at home.  - Will attempt to increase frequency of ambulation.  8. CAD: Strong family history of premature coronary disease.  He had extensive coronary calcification on CT chest this admission.  He has epigastric discomfort that has been attributed to symptomatic cholelithiasis, but given extensive coronary calcification, was concerned it could actually be angina. Coronary angiography 10/05/17 showed nonobstructive  disease.  - No s/s of ischemia.    - Continue statin.  - No ASA yet with low Hgb and GI bleed.   Possibly home today after Capsule Endo, but may need to wait until tomorrow to make sure Capsule clear.  He continues to require O2 higher than will be possible for him to get at home.  Suspect he overall has a poor prognosis, but to this point, mother has not been open to these conversations.   Length of Stay: [address]  Annamaria Helling  10/12/2017, 7:57 AM  Advanced Heart Failure Team Pager [phone] (M-F; 7a - 4p)  Please contact [facility] for night-coverage after hours (4p -7a ) and weekends on [email or web address]  Patient seen with PA, agree with the above note.  Oxygen saturation is difficult to keep > 80% this morning even on facemask.  CVP 14 on my check at bedside.  He is not in distress.  - I will stop torsemide and start Lasix 80 mg IV bid for now.   He cannot tolerate CPAP.  He certainly would not tolerate trach/long-term vent.  I think our only option here is home on high flow oxygen, can get up to 10 L/min at home.    Hemoglobin with appropriate bump after transfusion yesterday.  Capsule endoscopy today, will await results.   Tony Jones 10/12/2017 12:15 PM

## 2017-10-12 NOTE — Plan of Care (Signed)
  Problem: Activity: Goal: Capacity to carry out activities will improve Outcome: Progressing  Patient is progressing slowly d/t oxygenation issues. Given 2 units of PRBC today. Says he feels better this evening.

## 2017-10-12 NOTE — Progress Notes (Signed)
Tony Jones 11:07 AM  Subjective: Patient's case discussed with mom is doing okay with capsule but he did not have a bowel movement yesterday and no new complaints  Objective: Vital signs stable afebrile patient not examined today currently sleeping labs stable  Assessment: Probable subacute GI blood loss  Plan: Await capsule endoscopy will add Linzess  72 g which he takes at home which helps his constipation according to mom  [facility]  Pager [phone] After 5PM or if no answer call [phone]

## 2017-10-12 NOTE — Progress Notes (Signed)
Anderson Malta RN from endo spoke with this RN on the phone trying to figure out why the light on the monitor was off, the monitor turned on, but there was no light blinking, Anderson Malta promised to call back after consulting other staff, oncoming RN updated, monitor and its leads still attached to the patient.

## 2017-10-12 NOTE — Progress Notes (Signed)
Pt ingested givens small bowel capsule pill.  Instructions given to patient and mother.  Instructions given to floor RN.  Verb und.

## 2017-10-12 NOTE — Progress Notes (Signed)
Patient's mom gave patient  Linzess 75mg  from home not willing to wait for this RN to get med from pharmacy,saying ''the doctor said I can use the one from home'' will continue to monitor.

## 2017-10-13 LAB — GLUCOSE, CAPILLARY
GLUCOSE-CAPILLARY: 148 mg/dL — AB (ref 65–99)
GLUCOSE-CAPILLARY: 138 mg/dL — AB (ref 65–99)
Glucose-Capillary: 173 mg/dL — ABNORMAL HIGH (ref 65–99)
Glucose-Capillary: 166 mg/dL — ABNORMAL HIGH (ref 65–99)

## 2017-10-13 LAB — COOXEMETRY PANEL
Carboxyhemoglobin: 2.5 % — ABNORMAL HIGH (ref 0.5–1.5)
METHEMOGLOBIN: 1.6 % — AB (ref 0.0–1.5)
O2 Saturation: 70.4 %
Total hemoglobin: 10 g/dL — ABNORMAL LOW (ref 12.0–16.0)

## 2017-10-13 LAB — BASIC METABOLIC PANEL
Anion gap: 10 (ref 5–15)
BUN: 24 mg/dL — AB (ref 6–20)
CO2: 41 mmol/L — ABNORMAL HIGH (ref 22–32)
Calcium: 8.9 mg/dL (ref 8.9–10.3)
Chloride: 89 mmol/L — ABNORMAL LOW (ref 101–111)
Creatinine, Ser: 1.5 mg/dL — ABNORMAL HIGH (ref 0.61–1.24)
GFR calc Af Amer: 58 mL/min — ABNORMAL LOW (ref >60)
GFR, EST NON AFRICAN AMERICAN: 50 mL/min — AB (ref >60)
Glucose, Bld: 139 mg/dL — ABNORMAL HIGH (ref 65–99)
POTASSIUM: 3.6 mmol/L (ref 3.5–5.1)
SODIUM: 140 mmol/L (ref 135–145)

## 2017-10-13 MED ORDER — MIDODRINE HCL 10 MG PO TABS: 10.0000 mg | ORAL_TABLET | Freq: Three times a day (TID) | ORAL | 6 refills | Status: DC

## 2017-10-13 MED ORDER — TORSEMIDE 20 MG PO TABS: 80.0000 mg | ORAL_TABLET | Freq: Every day | ORAL | 3 refills | Status: DC

## 2017-10-13 MED ORDER — SILDENAFIL CITRATE 20 MG PO TABS: 60.0000 mg | ORAL_TABLET | Freq: Three times a day (TID) | ORAL | 3 refills | Status: DC

## 2017-10-13 MED ORDER — TORSEMIDE 20 MG PO TABS
80.0000 mg | ORAL_TABLET | Freq: Every day | ORAL | Status: DC
  Filled 2017-10-13: qty 4

## 2017-10-13 MED ORDER — ATORVASTATIN CALCIUM 40 MG PO TABS: 40.0000 mg | ORAL_TABLET | Freq: Every day | ORAL | 6 refills | Status: AC

## 2017-10-13 MED ORDER — POTASSIUM CHLORIDE CRYS ER 20 MEQ PO TBCR
20.0000 meq | EXTENDED_RELEASE_TABLET | Freq: Every day | ORAL | 3 refills | Status: AC
Start: 2017-10-14 — End: ?

## 2017-10-13 NOTE — Progress Notes (Addendum)
Patient ID: Tony Jones, male   DOB: [birth year], 57 y.o.   MRN: [id number]     Advanced Heart Failure Rounding Note  PCP-Cardiologist: No primary care provider on file.   Subjective:    EGD 10/04/17 cancelled due to hypoxia. Hgb remains stable this morning without overt bleeding.   No CBC drawn this am. Hgb up to 10.0 on Coox panel. Coox stable at 70%. CVP 9-10.   Underwent Capsule Endo 10/12/17. Home pending read and plan.   Weight down 5 lbs with IV lasix yesterday.   Denies SOB. Has been stable on HFNC at 10 lpm overnight. No BRBPR or frank melena.   High res Chest CT 10/01/17: Limited evaluation for ILD because of presence of pulmonary edema. The RV and PA were dilated.  There was extensive coronary calcification.   Abd [place] 10/02/17: Cholelithiasis without evidence for cholecystitis. Hepatic steatosis.  Small ascites.   RHC/LHC (6/5):  Coronary Findings  Diagnostic  Dominance: Right  Left Main  No left main, separate ostia LAD and LCx.  Left Anterior Descending  Separate ostium of LAD. LAD has luminal irregularities.  Left Circumflex  Separate ostium of LCx. Luminal irregularities.  Right Coronary Artery  Large caliber RCA with luminal irregularities. 50% stenosis ostial PDA.  Intervention   No interventions have been documented.  Right Heart   Right Heart Pressures    RHC Procedural Findings: Hemodynamics (mmHg) RA 16 RV 92/16 PA 93/51 mean 67 PCWP mean 27 LV 110/21 AO 115/75  Oxygen saturations: PA 56% AO 99%  Cardiac Output (Fick) 4.95  Cardiac Index (Fick) 2.65 PVR 8 WU PAPi 2.65    Objective:   Weight Range: 192 lb (87.1 kg) Body mass index is 36.28 kg/m.   Vital Signs:   Temp:  [97.7 F (36.5 C)-99.2 F (37.3 C)] 97.7 F (36.5 C) (06/13 0748) Pulse Rate:  [96-103] 103 (06/13 0748) Resp:  [17-22] 18 (06/13 0748) BP: (99-139)/(63-80) 110/69 (06/13 0748) SpO2:  [85 %-100 %] 95 % (06/13 0748) Weight:  [192 lb (87.1 kg)] 192 lb (87.1 kg) (06/13  0422) Last BM Date: 10/12/17  Weight change: Filed Weights   10/11/17 0409 10/12/17 0726 10/13/17 0422  Weight: 196 lb 4.8 oz (89 kg) 197 lb 1.6 oz (89.4 kg) 192 lb (87.1 kg)   Intake/Output:   Intake/Output Summary (Last 24 hours) at 10/13/2017 0801 Last data filed at 10/13/2017 0424 Gross per 24 hour  Intake 960 ml  Output 2970 ml  Net -2010 ml    Physical Exam   General: NAD  HEENT: Normal Neck: Supple. JVP difficult due to body habitus. Carotids 2+ bilat; no bruits. No thyromegaly or nodule noted. Cor: PMI nondisplaced. RRR, No M/G/R noted Lungs: CTAB, normal effort. Abdomen: Soft, non-tender, non-distended, no HSM. No bruits or masses. +BS  Extremities: No cyanosis, clubbing, or rash. R and LLE no edema.  Neuro: Alert & orientedx3, cranial nerves grossly intact. moves all 4 extremities w/o difficulty. Affect pleasant   Telemetry   Sinus tach 90-100s, personally reviewed.   Labs    CBC Recent Labs    10/11/17 0422 10/12/17 0437  WBC 6.8 8.6  NEUTROABS 6.0 7.5  HGB 6.6* 9.8*  HCT 24.1* 34.4*  MCV 83.1 85.1  PLT 111* 010*   Basic Metabolic Panel Recent Labs    10/11/17 0422 10/12/17 0437  NA 139 140  K 3.6 3.4*  CL 91* 92*  CO2 40* 37*  GLUCOSE 115* 121*  BUN 29* 24*  CREATININE  1.63* 1.68*  CALCIUM 8.6* 8.7*   Liver Function Tests No results for input(s): AST, ALT, ALKPHOS, BILITOT, PROT, ALBUMIN in the last 72 hours. No results for input(s): LIPASE, AMYLASE in the last 72 hours. Cardiac Enzymes No results for input(s): CKTOTAL, CKMB, CKMBINDEX, TROPONINI in the last 72 hours.  BNP: BNP (last 3 results) Recent Labs    07/30/17 0950 09/30/17 2317  BNP 290.8* 928.6*    ProBNP (last 3 results) No results for input(s): PROBNP in the last 8760 hours.   D-Dimer No results for input(s): DDIMER in the last 72 hours. Hemoglobin A1C No results for input(s): HGBA1C in the last 72 hours. Fasting Lipid Panel No results for input(s): CHOL, HDL,  LDLCALC, TRIG, CHOLHDL, LDLDIRECT in the last 72 hours. Thyroid Function Tests No results for input(s): TSH, T4TOTAL, T3FREE, THYROIDAB in the last 72 hours.  Invalid input(s): FREET3  Other results:   Imaging    No results found.   Medications:     Scheduled Medications: . allopurinol  300 mg Oral Q supper  . atorvastatin  40 mg Oral q1800  . fesoterodine  8 mg Oral Q supper  . furosemide  80 mg Intravenous BID  . insulin aspart  0-9 Units Subcutaneous TID WC  . lidocaine  1 application Urethral Once  . linaclotide  72 mcg Oral QAC breakfast  . midodrine  10 mg Oral TID WC  . multivitamin with minerals  1 tablet Oral Q breakfast  . oxybutynin  5 mg Oral BID WC  . pantoprazole  40 mg Oral BID WC  . potassium chloride  20 mEq Oral Daily  . sertraline  100 mg Oral Q breakfast  . sildenafil  60 mg Oral TID  . sodium chloride flush  3 mL Intravenous Q12H  . sodium chloride flush  3 mL Intravenous Q12H    Infusions: . sodium chloride    . sodium chloride      PRN Medications: sodium chloride, sodium chloride, acetaminophen, docusate sodium, magnesium hydroxide, ondansetron (ZOFRAN) IV, ondansetron, sodium chloride flush, sodium chloride flush, sodium chloride flush   Assessment/Plan   1. RV failure/pulmonary HTN:  Suspect the patient has severe RV failure in the setting of severe pulmonary hypertension. Echo showed flattened septum and severe pulmonary hypertension. On CT chest RV and PA were dilated. I strongly suspect group 3 PH from chronic hypoxemia, probably OHS/OSA.  He has not had a sleep study to diagnosis OSA but it is strongly suspected.  V/Q scan in 2016 did not suggest chronic PE.  RHC showed severely elevated PA pressure yesterday with preserved PAPi and elevated RA pressure and PCWP.  Cannot rule out group 1 PH contribution with markedly high PA pressure. He got IV Lasix with good diuresis yesterday, CVP 9-10 this morning.  - He will not tolerate CPAP.  -  Will give Lasix 80 mg IV x 1 today then transition to torsemide 80 mg daily tomorrow.  - Continue sildenafil 60 mg TID.  - Keep oxygen saturation up with nasal cannula/HFNC.  Meets O2 requirement, but worry 10 lpm will not suffice for him at home. Will make sure paperwork still effective with prolonged stay.  - Continue midodrine 10 mg tid to maintain BP, suspect hypotension is due to RV failure..  2. Chronic hypoxemic respiratory failure: As above suspect OHS/OSA.  High resolution chest CT was limited by pulmonary edema but did not appear to show interstitial lung disease.  PCCM agrees. - CCM has seen and ordered  CPAP QHS, will have him try it with nasal pillows tonight. Recommend outpatient PFTs.  - HFNC requiring flows up to 15 lpm. He can get up to 10 lpm at home. Orders sent. Will make sure date remains effective.   - Unfortunately pt is completely intolerant of CPAP.  3. CKD: Stage 3.   - BMET pending.  4. Cholelithiasis: Abdomen is not tender today and he has been eating meals.  Has had symptomatic cholelithiasis and cholecystectomy recommended. RUQ [place] 6/1, cholelithiasis with no evidence for active cholecystitis.    - Will eventually need gallbladder addressed but not urgent at this time.  - No change to current plan.   5. Anemia: Chronic, with Fe deficiency. He had a dose of IV Fe.  Most recently, had UGIB in 3/19 with EGD showing no definite cause for bleeding (other than gastritis). Occult stool positive. EGD 10/04/17 cancelled due to hypoxia. Hemoglobin is stable currently.  - Continue BID PPI.  - Hgb 6.6 -> 2 uPRBC -> 9.4 -> 10.0 (on coox) - Capsule Endo performed 10/12/17. Plan pending read.  6. Developmental delay - Mother at bedside to assist communication. - No change to current plan.   7. Undefined muscular dystrophy:  - Continue to work with PT. Will need at home.  - Will attempt to increase frequency of ambulation.  - No change to current plan.   8. CAD: Strong family history  of premature coronary disease.  He had extensive coronary calcification on CT chest this admission.  He has epigastric discomfort that has been attributed to symptomatic cholelithiasis, but given extensive coronary calcification, was concerned it could actually be angina. Coronary angiography 10/05/17 showed nonobstructive disease.  - No s/s of ischemia.    - Continue statin.  - No ASA yet with low Hgb and GI bleed.   Likely home today if Capsule endo clear. Will speak with CM to re-certify home O2.   Length of Stay: [place], [place]  10/13/2017, 8:01 AM  Advanced Heart Failure Team Pager [phone] (M-F; 7a - 4p)  Please contact [facility] for night-coverage after hours (4p -7a ) and weekends on [email or web address]  Patient seen with PA, agree with the above note.  He looks better today, diuresed well with IV Lasix yesterday.  CVP 9-10.  Oxygen saturation 100% today on 10L/min.   - Will give Lasix 80 mg IV x 1 today and transition to torsemide 80 mg daily starting tomorrow.  - Continue sildenafil 60 mg tid.   Hgb stable off co-ox at 10.  Waiting for capsule endoscopy result.  If he does not need further GI evaluation, can go home today.   Loralie Champagne 10/13/2017 8:49 AM

## 2017-10-13 NOTE — Progress Notes (Signed)
Clinical Social Worker meet patient and mother at bedside to provided patients mother with denial letter from [employer]. Patients mother stated she is disappointed with insurance decision to deny patient from going to short term rehab. Mother stated she will do what she can for patient at home. CSW signing off as patients social work needs have been meet.   Rhea Pink, MSW,  [place]

## 2017-10-13 NOTE — Progress Notes (Signed)
CM has sent in new 02 qualifying note from today for new liter flow along with updated new CMN paperwork for HFNC 10L home 02- have spoken with Ivin Booty who states that new concentrator and tanks can be delivered to home today and will contact pt's mom when approved for delivery to home- per mom she will have her sister go to house to accept delivery.

## 2017-10-13 NOTE — Progress Notes (Signed)
Physical Therapy Treatment Patient Details Name: Tony Jones MRN: [id number] DOB: [birth year] Today's Date: 10/13/2017    History of Present Illness Pt is a 57 y.o. male with complex history developmental delay, Muscular dystrophy, chronic hypoxia with witnessed apnea (would not be tolerant of CPAP so uses O2), Barrett's esophagus, anemia, anxiety, cholelithasis, nephrolithiasis, DM, dyslipidemia, GERD, HTN, pulmonary HTN and right heart failure who was admitted directly to the hospital for further evaluation of recent malaise and failure to thrive. He does very little walking, but he has been SOB with minimal exertion.    PT Comments    Pt progressing with mobility. Pt amb on 10L of O2 with SpO2 to 73%. Pt still conversing and dyspnea 2-3/4. SpO2 returned to 91% after 3-4 minutes of sitting.    Follow Up Recommendations  Home health PT;Supervision for mobility/OOB     Equipment Recommendations  None recommended by PT    Recommendations for Other Services       Precautions / Restrictions Precautions Precautions: Other (comment) Precaution Comments: has low SpO2 but not syptomatic Restrictions Weight Bearing Restrictions: No    Mobility  Bed Mobility               General bed mobility comments: Pt up in chair  Transfers Overall transfer level: Needs assistance Equipment used: 4-wheeled walker Transfers: Sit to/from Stand Sit to Stand: Supervision         General transfer comment: supervision for lines/safety  Ambulation/Gait Ambulation/Gait assistance: Supervision Gait Distance (Feet): 250 Feet Assistive device: 4-wheeled walker Gait Pattern/deviations: Step-through pattern;Decreased stride length;Trunk flexed Gait velocity: decr Gait velocity interpretation: 1.31 - 2.62 ft/sec, indicative of limited community ambulator General Gait Details: supervision for lines and safety. Pt amb on 10L of O2 with SpO2 to 73%   Stairs             Wheelchair  Mobility    Modified Rankin (Stroke Patients Only)       Balance Overall balance assessment: Mild deficits observed, not formally tested                                          Cognition Arousal/Alertness: Awake/alert Behavior During Therapy: WFL for tasks assessed/performed Overall Cognitive Status: History of cognitive impairments - at baseline                                 General Comments: pt perseverating on going home today      Exercises      General Comments        Pertinent Vitals/Pain Pain Assessment: No/denies pain    Home Living                      Prior Function            PT Goals (current goals can now be found in the care plan section) Progress towards PT goals: Progressing toward goals    Frequency    Min 3X/week      PT Plan Current plan remains appropriate    Co-evaluation              AM-PAC PT "6 Clicks" Daily Activity  Outcome Measure  Difficulty turning over in bed (including adjusting bedclothes, sheets and blankets)?: A Little Difficulty moving from lying on back to  sitting on the side of the bed? : A Little Difficulty sitting down on and standing up from a chair with arms (e.g., wheelchair, bedside commode, etc,.)?: A Little Help needed moving to and from a bed to chair (including a wheelchair)?: A Little Help needed walking in hospital room?: A Little Help needed climbing 3-5 steps with a railing? : A Little 6 Click Score: 18    End of Session Equipment Utilized During Treatment: Gait belt;Oxygen Activity Tolerance: Patient tolerated treatment well Patient left: in chair;with call bell/phone within reach;with family/visitor present Nurse Communication: Mobility status PT Visit Diagnosis: Other abnormalities of gait and mobility (R26.89);Muscle weakness (generalized) (M62.81)     Time: 0223-3612 PT Time Calculation (min) (ACUTE ONLY): 22 min  Charges:  $Gait Training:  8-22 mins                    G Codes:       [facility] PT [place] 10/13/2017, 10:30 AM

## 2017-10-13 NOTE — Progress Notes (Signed)
SATURATION QUALIFICATIONS: (This note is used to comply with regulatory documentation for home oxygen)  Patient Saturations on 10 Liters at Rest = 95%  Patient Saturations on 10 Liters of oxygen while Ambulating = 73%  Patient Saturations on 10 Liters of oxygen 3-4 minutes after ambulating = 91%  Please briefly explain why patient needs home oxygen: Pt with low O2 saturation with activity.  [employer] PT [phone]

## 2017-10-13 NOTE — Progress Notes (Signed)
Occupational Therapy Treatment Patient Details Name: Tony Jones MRN: [id number] DOB: Mar 10, 1961 Today's Date: 10/13/2017    History of present illness Pt is a 57 y.o. male with complex history developmental delay, Muscular dystrophy, chronic hypoxia with witnessed apnea (would not be tolerant of CPAP so uses O2), Barrett's esophagus, anemia, anxiety, cholelithasis, nephrolithiasis, DM, dyslipidemia, GERD, HTN, pulmonary HTN and right heart failure who was admitted directly to the hospital for further evaluation of recent malaise and failure to thrive. He does very little walking, but he has been SOB with minimal exertion.   OT comments  This 57 yo male admitted with above presents to acute OT making progress with toileting transfers, but did not do as well today with putting his own shoes on (he started but then said his legs hurt too much). Sats drop into 70's on 10 liters HFNC with cues for purse lipped breathing to recover number wise--otherwise pt does not present as if he is in the 70's--talking and answering questions without issues.  Follow Up Recommendations  Home health OT;Supervision/Assistance - 24 hour    Equipment Recommendations  None recommended by OT       Precautions / Restrictions Precautions Precautions: Other (comment) Precaution Comments: has low SpO2 (in 70's with activity) but not syptomatic Restrictions Weight Bearing Restrictions: No       Mobility Bed Mobility Overal bed mobility: Needs Assistance Bed Mobility: Supine to Sit;Sit to Supine     Supine to sit: Supervision;HOB elevated Sit to supine: Supervision;HOB elevated      Transfers Overall transfer level: Needs assistance Equipment used: Rolling walker (2 wheeled);None(pushing IV pole) Transfers: Sit to/from Stand Sit to Stand: Supervision         General transfer comment: pt ambulated 100 feet with pushing IV pole (min A), when back in room I had him use RW (usually uses a 4RW) and pt S        ADL either performed or assessed with clinical judgement   ADL Overall ADL's : Needs assistance/impaired                       Lower Body Dressing Details (indicate cue type and reason): total A today for donning slippers (stating his legs hurt too bad), can doff them without issue at EOB Toilet Transfer: Ambulation;RW;Regular Toilet;Grab bars;Supervision/safety     Toileting - Clothing Manipulation Details (indicate cue type and reason): total A for back peri care (even pta per mom)             Vision Patient Visual Report: No change from baseline            Cognition Arousal/Alertness: Awake/alert Behavior During Therapy: WFL for tasks assessed/performed Overall Cognitive Status: History of cognitive impairments - at baseline                                 General Comments: pt perseverating on his legs hurting                   Pertinent Vitals/ Pain       Pain Assessment: Faces Faces Pain Scale: Hurts little more Pain Location: Bil LEs Pain Descriptors / Indicators: Grimacing Pain Intervention(s): Monitored during session;Repositioned;Patient requesting pain meds-RN notified         Frequency  Min 2X/week        Progress Toward Goals  OT Goals(current goals can now be  found in the care plan section)  Progress towards OT goals: Progressing toward goals     Plan Discharge plan needs to be updated       AM-PAC PT "6 Clicks" Daily Activity     Outcome Measure   Help from another person eating meals?: None Help from another person taking care of personal grooming?: A Little Help from another person toileting, which includes using toliet, bedpan, or urinal?: A Lot Help from another person bathing (including washing, rinsing, drying)?: A Lot Help from another person to put on and taking off regular upper body clothing?: A Little Help from another person to put on and taking off regular lower body clothing?: Total 6  Click Score: 15    End of Session Equipment Utilized During Treatment: Oxygen(HFNC 10 liters)  OT Visit Diagnosis: History of falling (Z91.81);Muscle weakness (generalized) (M62.81)   Activity Tolerance Patient tolerated treatment well   Patient Left in bed;with call bell/phone within reach;with family/visitor present   Nurse Communication Patient requests pain meds        Time: 9629-5284 OT Time Calculation (min): 52 min  Charges: OT General Charges $OT Visit: 1 Visit OT Treatments $Self Care/Home Management : 38-52 mins  [place] [address], OTR/L [phone] 10/13/2017

## 2017-10-13 NOTE — Care Management Important Message (Signed)
Important Message  Patient Details  Name: Tony Jones MRN: [id number] Date of Birth: Mar 22, 1961   Medicare Important Message Given:  Yes    Mariena Meares P Aubert Choyce 10/13/2017, 4:26 PM

## 2017-10-13 NOTE — Progress Notes (Signed)
PROCEDURE NOTE  09/30/2017 - 10/12/2017  9:35 AM  PATIENT:  Tony Jones  57 y.o. male  PRE-OPERATIVE DIAGNOSIS:  Guaiac positive anemia  POST-OPERATIVE DIAGNOSIS: same  PROCEDURE: capsule endoscopy  SURGEON:  Surgeon(s): [name], MD  ASSESSMENT/FINDINGS:  Normal Endoscopy  PLAN OF CARE: will ask mother to set up cologuard test as outpatient and then follow up and call sooner when necessary and okay with me to go home

## 2017-10-13 NOTE — Progress Notes (Signed)
Tony Jones 9:44 AM  Subjective: Patient doing well no GI complaints capsules results discussed with patient and mother 2 bowel movements noted yesterday  Objective: Vital signs stable afebrile no acute distress abdomen is soft nontender chemistry stable hemoglobin stable  Assessment: Episodic guaiac positive anemia  Plan: Will plan cologuard test as an outpatient and if positive proceed with colonoscopy or if he continues to have GI blood loss that would be the next test and they will call me when necessary and okay with me to go home from a GI standpoint  [facility] E  Pager [phone] After 5PM or if no answer call [phone]

## 2017-10-14 ENCOUNTER — Other Ambulatory Visit: Payer: Self-pay

## 2017-10-14 ENCOUNTER — Encounter (HOSPITAL_COMMUNITY): Payer: Self-pay | Admitting: Gastroenterology

## 2017-10-14 NOTE — Patient Outreach (Signed)
[place] [facility]) Care Management  10/14/2017  Tony Jones 05-31-1960 [id number]   Referral received 10/11/17 per [facility]. [place] [facility], hospital liaison for [facility] Coordination needs.  Assessment: 57 year old with history of heart failure, pulmonary hypertension, chronic hypoxic respiratory failure, DM, developmental delay, muscular dystophy, Barrett's esophagus. Admitted 5/31-6/13 due to malaise and failure to thrive.  Per chart: heart failure; chronic hypoxemic respiratory failure; guiac positive anema. Discharged home on 6/13 in the care of his mother. Home health-[facility]. Home oxygen per [facility].  RNCM called to follow up. No answer. Unable to leave message.  Plan: RNCM will send outreach letter and follow up call within 3-4 business days.  Thea Silversmith, RN, MSN, [place] Coordinator Cell: [phone]

## 2017-10-15 DIAGNOSIS — I2510 Atherosclerotic heart disease of native coronary artery without angina pectoris: Secondary | ICD-10-CM | POA: Diagnosis not present

## 2017-10-15 DIAGNOSIS — D631 Anemia in chronic kidney disease: Secondary | ICD-10-CM | POA: Diagnosis not present

## 2017-10-15 DIAGNOSIS — J9611 Chronic respiratory failure with hypoxia: Secondary | ICD-10-CM | POA: Diagnosis not present

## 2017-10-15 DIAGNOSIS — N183 Chronic kidney disease, stage 3 (moderate): Secondary | ICD-10-CM | POA: Diagnosis not present

## 2017-10-15 DIAGNOSIS — G7100 Muscular dystrophy, unspecified: Secondary | ICD-10-CM | POA: Diagnosis not present

## 2017-10-15 DIAGNOSIS — I50810 Right heart failure, unspecified: Secondary | ICD-10-CM | POA: Diagnosis not present

## 2017-10-15 DIAGNOSIS — K2270 Barrett's esophagus without dysplasia: Secondary | ICD-10-CM | POA: Diagnosis not present

## 2017-10-15 DIAGNOSIS — I272 Pulmonary hypertension, unspecified: Secondary | ICD-10-CM | POA: Diagnosis not present

## 2017-10-15 DIAGNOSIS — F419 Anxiety disorder, unspecified: Secondary | ICD-10-CM | POA: Diagnosis not present

## 2017-10-15 DIAGNOSIS — E1122 Type 2 diabetes mellitus with diabetic chronic kidney disease: Secondary | ICD-10-CM | POA: Diagnosis not present

## 2017-10-15 DIAGNOSIS — R6250 Unspecified lack of expected normal physiological development in childhood: Secondary | ICD-10-CM | POA: Diagnosis not present

## 2017-10-15 DIAGNOSIS — K8020 Calculus of gallbladder without cholecystitis without obstruction: Secondary | ICD-10-CM | POA: Diagnosis not present

## 2017-10-15 DIAGNOSIS — Z9181 History of falling: Secondary | ICD-10-CM | POA: Diagnosis not present

## 2017-10-15 DIAGNOSIS — I130 Hypertensive heart and chronic kidney disease with heart failure and stage 1 through stage 4 chronic kidney disease, or unspecified chronic kidney disease: Secondary | ICD-10-CM | POA: Diagnosis not present

## 2017-10-15 DIAGNOSIS — Z7984 Long term (current) use of oral hypoglycemic drugs: Secondary | ICD-10-CM | POA: Diagnosis not present

## 2017-10-17 ENCOUNTER — Other Ambulatory Visit: Payer: Self-pay

## 2017-10-17 NOTE — Patient Outreach (Signed)
[facility]) Care Management  10/17/2017  Tony Jones December 06, 1960 [id number]   Referral received 10/11/17 per [facility]. [place] [facility], hospital liaison for [facility] Coordination needs.  Assessment: 57 year old with history of heart failure, pulmonary hypertension, chronic hypoxic respiratory failure, DM, developmental delay, muscular dystophy, Barrett's esophagus. Admitted 5/31-6/13 due to malaise and failure to thrive.  Per chart: heart failure; chronic hypoxemic respiratory failure; guiac positive anema. Discharged home on 6/13 in the care of his mother. Home health-[facility]. Home oxygen per [facility].  RNCM called to follow up. No answer. Unable to leave message.  Plan: RNCM will await return call and follow up in 3-4 business days if no return call. Outreach letter sent 10/14/17.  Thea Silversmith, RN, MSN, [place] Coordinator Cell: [phone]

## 2017-10-19 ENCOUNTER — Telehealth: Payer: Self-pay

## 2017-10-19 DIAGNOSIS — K921 Melena: Secondary | ICD-10-CM | POA: Diagnosis not present

## 2017-10-19 DIAGNOSIS — I272 Pulmonary hypertension, unspecified: Secondary | ICD-10-CM | POA: Diagnosis not present

## 2017-10-19 DIAGNOSIS — J9611 Chronic respiratory failure with hypoxia: Secondary | ICD-10-CM | POA: Diagnosis not present

## 2017-10-19 DIAGNOSIS — Z1211 Encounter for screening for malignant neoplasm of colon: Secondary | ICD-10-CM | POA: Diagnosis not present

## 2017-10-19 NOTE — Telephone Encounter (Signed)
   [facility] Pre-operative Risk Assessment    Request for surgical clearance:  1. What type of surgery is being performed? cystoscopy, right retrograde pyelogram, ureteroscopy laser lithotripsy extraction of stome, right ureteral stent placement   2. When is this surgery scheduled? pending   3. What type of clearance is required (medical clearance vs. Pharmacy clearance to hold med vs. Both)? both  4. Are there any medications that need to be held prior to surgery and how long? aspirin; 5 days prior to surgery  5. Practice name and name of physician performing surgery? [facility]; Dr [name]   6. What is your office phone number? [phone]   7.   What is your office fax number? [phone]   8.   Anesthesia type (None, local, MAC, general) ? n/a   Tony Jones, [place] 10/19/2017, 6:29 PM  _________________________________________________________________   (provider comments below)

## 2017-10-19 NOTE — Telephone Encounter (Signed)
Extender Beaulieu PA spoke to patient's mother on 09/30/17. Recommend to go hospital for evaluation. Will close this encounter.

## 2017-10-20 ENCOUNTER — Other Ambulatory Visit: Payer: Self-pay

## 2017-10-20 NOTE — Telephone Encounter (Signed)
Dr. Aundra Dubin- There is request for clearance for kidney stone procedure. Since he was seen recently for CHF and has follow up with you can you please address this.   I called and spoke with the patient's mother who reports that pt has just been in the hospital and had CHF. He has an appt with Dr. Aundra Dubin tomorrow. I will forward this to Dr. Aundra Dubin so that he can address clearance for the procedure.

## 2017-10-20 NOTE — Patient Outreach (Signed)
[facility]) Care Management  10/20/2017  Tony Jones [birth year] [id number]  Referral received 10/11/17 per [facility]. [place] [facility], hospital liaison for [facility] Coordination needs.  Assessment: 57 year old with history of heart failure, pulmonary hypertension, chronic hypoxic respiratory failure, DM, developmental delay, muscular dystophy, Barrett's esophagus. Per chart admitted 5/31-6/13 with: heart failure; chronic hypoxemic respiratory failure; guiac positive anema. Client is active with [facility].   RNCM called to follow up post discharge. RNCM spoke with client's mother. Two patient identifiers confirmed. Mrs. Mihelich reports client attended primary care follow up appointment. She denies any questions or concerns at this time. She reports client is active with [facility] for Physical therapy, nursing and bath aide. Home oxygen per [facility].  Client mother is primary caregiver. Mrs. Train states he is 54 and and developmentally at the age of a 57 year old. Mrs. Cedar reports he has two brother's that are supportive, but states they work. She provides client's transportation and does everything for client. She expresses some exhaustion adding in addition to taking care of client she has had to take care of her husband who is now deceased when he was sick. RNCM discussed [facility] social work services for available resources. Mrs. Brackins is agreeable to social work referral.  Follow up appointment with cardiologist tomorrow. Mrs. Woody reports she is in the process of getting approval and obtaining [employer] portable oxygen tank that will make it easier to transport client.  [place] [employer] [place]. RNCM discussed home visit. Mrs. Gorton request telephonic contact next week.  Plan: [place] work referral; follow up telephonically next week.  Thea Silversmith, RN, MSN, [place]  Coordinator Cell: [phone]

## 2017-10-21 ENCOUNTER — Ambulatory Visit (HOSPITAL_COMMUNITY)
Admit: 2017-10-21 | Discharge: 2017-10-21 | Disposition: A | Discharge status: Home / Self Care | Payer: PPO | Attending: Cardiology | Admitting: Cardiology

## 2017-10-21 VITALS — BP 152/96 | HR 82 | Wt 187.8 lb

## 2017-10-21 DIAGNOSIS — Z87442 Personal history of urinary calculi: Secondary | ICD-10-CM | POA: Diagnosis not present

## 2017-10-21 DIAGNOSIS — I2510 Atherosclerotic heart disease of native coronary artery without angina pectoris: Secondary | ICD-10-CM | POA: Insufficient documentation

## 2017-10-21 DIAGNOSIS — G4733 Obstructive sleep apnea (adult) (pediatric): Secondary | ICD-10-CM | POA: Diagnosis not present

## 2017-10-21 DIAGNOSIS — Z9981 Dependence on supplemental oxygen: Secondary | ICD-10-CM | POA: Diagnosis not present

## 2017-10-21 DIAGNOSIS — I272 Pulmonary hypertension, unspecified: Secondary | ICD-10-CM | POA: Diagnosis not present

## 2017-10-21 DIAGNOSIS — G7100 Muscular dystrophy, unspecified: Secondary | ICD-10-CM | POA: Diagnosis not present

## 2017-10-21 DIAGNOSIS — K219 Gastro-esophageal reflux disease without esophagitis: Secondary | ICD-10-CM | POA: Insufficient documentation

## 2017-10-21 DIAGNOSIS — Z79899 Other long term (current) drug therapy: Secondary | ICD-10-CM | POA: Diagnosis not present

## 2017-10-21 DIAGNOSIS — R6250 Unspecified lack of expected normal physiological development in childhood: Secondary | ICD-10-CM | POA: Insufficient documentation

## 2017-10-21 DIAGNOSIS — I50810 Right heart failure, unspecified: Secondary | ICD-10-CM

## 2017-10-21 DIAGNOSIS — I130 Hypertensive heart and chronic kidney disease with heart failure and stage 1 through stage 4 chronic kidney disease, or unspecified chronic kidney disease: Secondary | ICD-10-CM | POA: Insufficient documentation

## 2017-10-21 DIAGNOSIS — J9611 Chronic respiratory failure with hypoxia: Secondary | ICD-10-CM | POA: Diagnosis not present

## 2017-10-21 DIAGNOSIS — N183 Chronic kidney disease, stage 3 (moderate): Secondary | ICD-10-CM | POA: Diagnosis not present

## 2017-10-21 DIAGNOSIS — Z7984 Long term (current) use of oral hypoglycemic drugs: Secondary | ICD-10-CM | POA: Diagnosis not present

## 2017-10-21 DIAGNOSIS — E1122 Type 2 diabetes mellitus with diabetic chronic kidney disease: Secondary | ICD-10-CM | POA: Diagnosis not present

## 2017-10-21 DIAGNOSIS — I5032 Chronic diastolic (congestive) heart failure: Secondary | ICD-10-CM

## 2017-10-21 DIAGNOSIS — F419 Anxiety disorder, unspecified: Secondary | ICD-10-CM | POA: Diagnosis not present

## 2017-10-21 DIAGNOSIS — K2270 Barrett's esophagus without dysplasia: Secondary | ICD-10-CM | POA: Insufficient documentation

## 2017-10-21 DIAGNOSIS — E785 Hyperlipidemia, unspecified: Secondary | ICD-10-CM | POA: Insufficient documentation

## 2017-10-21 LAB — CBC
HEMATOCRIT: 34.3 % — AB (ref 39.0–52.0)
HEMOGLOBIN: 10.2 g/dL — AB (ref 13.0–17.0)
MCH: 25.8 pg — AB (ref 26.0–34.0)
MCHC: 29.7 g/dL — AB (ref 30.0–36.0)
MCV: 86.8 fL (ref 78.0–100.0)
Platelets: 255 10*3/uL (ref 150–400)
RBC: 3.95 MIL/uL — AB (ref 4.22–5.81)
RDW: 26.9 % — ABNORMAL HIGH (ref 11.5–15.5)
WBC: 13.7 10*3/uL — ABNORMAL HIGH (ref 4.0–10.5)

## 2017-10-21 LAB — BASIC METABOLIC PANEL
ANION GAP: 13 (ref 5–15)
BUN: 42 mg/dL — ABNORMAL HIGH (ref 6–20)
CALCIUM: 9.2 mg/dL (ref 8.9–10.3)
CO2: 35 mmol/L — AB (ref 22–32)
Chloride: 89 mmol/L — ABNORMAL LOW (ref 101–111)
Creatinine, Ser: 1.89 mg/dL — ABNORMAL HIGH (ref 0.61–1.24)
GFR calc non Af Amer: 38 mL/min — ABNORMAL LOW (ref >60)
GFR, EST AFRICAN AMERICAN: 44 mL/min — AB (ref >60)
Glucose, Bld: 123 mg/dL — ABNORMAL HIGH (ref 65–99)
POTASSIUM: 4 mmol/L (ref 3.5–5.1)
Sodium: 137 mmol/L (ref 135–145)

## 2017-10-21 MED ORDER — SILDENAFIL CITRATE 20 MG PO TABS: 80.0000 mg | ORAL_TABLET | Freq: Three times a day (TID) | ORAL | 3 refills | Status: DC

## 2017-10-21 NOTE — Telephone Encounter (Signed)
Spoke with pt's mother. She stated that the pt seen Dr. Aundra Dubin today but they did not fully discuss the surgery because he had not received any notes regarding the surgery, She stated that [facility] said pt would not be able to get gallbladder surgery but was okay to get kidney stones removed. Pt has a follow up appointment in 3 weeks to see a PA/NP at the heart failure clinic.

## 2017-10-21 NOTE — Telephone Encounter (Signed)
The patient has been seen by Dr. Aundra Dubin and is to have close follow up in 3 weeks. No mention of clearance. He likely has to continue to get tuned up prior to his surgery.   Pre-op call back please call and speak with his mother regarding plans.

## 2017-10-21 NOTE — Telephone Encounter (Signed)
   Primary Cardiologist: Glenetta Hew, MD  Chart reviewed as part of pre-operative protocol coverage. 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 He has chronic hypoxia. Past normal echocardiograms. During recent hospitalization for malaise and FTT Echo showed severe pulmonary HTN. R&L heart cath showed nonobstructive CAD and severely elevated PA pressure. He is being treated with diuretic, sildenafil, oxygen. He cannot tolerate CPAP.   He is being followed in the [facility]. Per Dr. Aundra Dubin the patient is OK for the procedure if he is minimally sedated. General anesthesia would be very difficult.   There is no aspirin on his med list. If he is taking it, it can he held.   I will route this recommendation to the requesting party via Epic fax function and remove from pre-op pool.  Please call with questions.  Daune Perch, NP 10/21/2017, 6:13 PM

## 2017-10-21 NOTE — Patient Instructions (Signed)
Increase Sildenafil 80 mg (4 tabs), three times a day  Your physician has recommended that you have a pulmonary function test. Pulmonary Function Tests are a group of tests that measure how well air moves in and out of your lungs.  Call an make appointment with Dr. Halford Chessman Pulmonologist  Labs drawn today (if we do not call you, then your lab work was stable)   Your physician recommends that you schedule a follow-up appointment in: 3 weeks with APP clinic  Your physician recommends that you schedule a follow-up appointment in: 2-3 months with Dr. Aundra Dubin

## 2017-10-21 NOTE — Telephone Encounter (Signed)
OK for procedure if he is minimally sedated.  General anesthesia would be very difficult.

## 2017-10-23 NOTE — Progress Notes (Signed)
PCP: Dr Jimmye Norman HF Cardiology: Dr. Aundra Dubin  57 y.o. with history of developmental delay, CKD stage 3, chronic diastolic CHF with prominent RV failure and pulmonary hypertension, and OHS/OSA presents for followup of CHF and pulmonary hypertension.  Patient was admitted in 6/19 with acute on chronic diastolic CHF with prominent RV failure.  RHC showed elevated right and left heart filling pressures with severe pulmonary arterial hypertension.  Patient is on high flow oxygen for OHS, he cannot tolerate CPAP. He was diuresed extensively.  He had upper GI bleeding.  Capsule study was done in the hospital but was unrevealing.    He lives with his mother who takes care of him and helps with ADLs.  BP is high today, per his mother it usually runs SBP 100s.  He has been walking around the house with his oxygen without getting short of breath.  He is doing home PT. No chest pain.  He had 1 episode of maroon stool since getting home. No syncope or lightheadedness.  He has had no abdominal pain or back pain.  No fever.    ECG (5/19, personally reviewed): NSR, RVH, inferior TWIs  Labs (6/19): K 3.6, creatinine 1.5  PMH: 1. Developmental delay.  2. CKD: Stage 3.  3. Barretts esophagus with GERD 4. Cholelithiasis 5. Nephrolithiasis 6. OHS/OSA: Wears high flow oxygen at all times, cannot tolerate CPAP.  7. Type II diabetes.  8. HTN 9. Hyperlipidemia 10. Upper GI bleeding: Capsule study in 6/19 was negative.  11. Coronary disease: Nonobstructive.  - LHC (6/19) with 50% ostial PDA, otherwise luminal irregularities.  12. Fe deficiency anemia: Due to GI blood losses.  13. 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   - RHC (6/19): mean RA 16, PA 93/51 mean 67, mean 27, CI 2.65, PVR 8 WU - V/Q scan (2016): No evidence for chronic PE.  - High resolution chest CT (6/19): no definite ILD.  - Echo (5/19): EF 60-65%, mild LVH, septal flattening with RV failure, PASP 75  mmhg.  14. Chronic diastolic CHF 15. ABIs (9/17): normal  Social History   Socioeconomic History  . Marital status: Single    Spouse name: Not on file  . Number of children: Not on file  . Years of education: Not on file  . Highest education level: Not on file  Occupational History  . Occupation: unemployed  Social Needs  . Financial resource strain: Not on file  . Food insecurity:    Worry: Not on file    Inability: Not on file  . Transportation needs:    Medical: Not on file    Non-medical: Not on file  Tobacco Use  . Smoking status: Never Smoker  . Smokeless tobacco: Never Used  Substance and Sexual Activity  . Alcohol use: No  . Drug use: No  . Sexual activity: Never  Lifestyle  . Physical activity:    Days per week: Not on file    Minutes per session: Not on file  . Stress: Not on file  Relationships  . Social connections:    Talks on phone: Not on file    Gets together: Not on file    Attends religious service: Not on file    Active member of club or organization: Not on file    Attends meetings of clubs or organizations: Not on file    Relationship status: Not on file  . Intimate partner violence:    Fear of current or  ex partner: Not on file    Emotionally abused: Not on file    Physically abused: Not on file    Forced sexual activity: Not on file  Other Topics Concern  . Not on file  Social History Narrative  . Not on file   Family History  Problem Relation Age of Onset  . Congestive Heart Failure Father   . Heart disease Father   . Heart attack Father   . Atrial fibrillation Father   . Brain cancer Father   . Melanoma Father   . Diabetes Mother   . Stroke Mother   . Hypertension Mother   . Fainting Paternal Grandfather   . Congestive Heart Failure Paternal Grandfather   . Atrial fibrillation Paternal Grandfather    ROS: All systems reviewed and negative except as per HPI.  Current Outpatient Medications  Medication Sig Dispense Refill   . acetaminophen (TYLENOL) 650 MG CR tablet Take 1,300 mg by mouth daily as needed for pain.    Marland Kitchen allopurinol (ZYLOPRIM) 300 MG tablet Take 300 mg by mouth daily with supper.     Marland Kitchen atorvastatin (LIPITOR) 40 MG tablet Take 1 tablet (40 mg total) by mouth daily at 6 PM. 30 tablet 6  . docusate sodium (COLACE) 100 MG capsule Take 100 mg by mouth daily as needed for mild constipation.     . fesoterodine (TOVIAZ) 8 MG TB24 tablet Take 8 mg by mouth daily with supper.     Marland Kitchen glimepiride (AMARYL) 2 MG tablet Take 2 mg by mouth daily with breakfast.    . levocetirizine (XYZAL) 5 MG tablet Take 5 mg by mouth daily with breakfast.     . meclizine (ANTIVERT) 25 MG tablet Take 25 mg by mouth 3 (three) times daily as needed for dizziness.   0  . metFORMIN (GLUCOPHAGE) 1000 MG tablet Take 1,000 mg by mouth 2 (two) times daily with a meal.  1  . midodrine (PROAMATINE) 10 MG tablet Take 1 tablet (10 mg total) by mouth 3 (three) times daily with meals. 90 tablet 6  . Multiple Vitamin (MULTIVITAMIN WITH MINERALS) TABS tablet Take 1 tablet by mouth daily with breakfast.     . OVER THE COUNTER MEDICATION Place 1 drop into both eyes daily as needed (dry eyes). Over the counter lubricating eye drop    . oxybutynin (DITROPAN) 5 MG tablet Take 5 mg by mouth 2 (two) times daily with a meal.     . pantoprazole (PROTONIX) 40 MG tablet Take 40 mg by mouth 2 (two) times daily with a meal.     . potassium chloride SA (K-DUR,KLOR-CON) 20 MEQ tablet Take 1 tablet (20 mEq total) by mouth daily. 30 tablet 3  . sertraline (ZOLOFT) 100 MG tablet Take 100 mg by mouth daily with breakfast.  1  . sildenafil (REVATIO) 20 MG tablet Take 4 tablets (80 mg total) by mouth 3 (three) times daily. 360 tablet 3  . tiZANidine (ZANAFLEX) 2 MG tablet Take 1 mg by mouth every 6 (six) hours as needed (leg cramps).     . torsemide (DEMADEX) 20 MG tablet Take 4 tablets (80 mg total) by mouth daily. 120 tablet 3  . vitamin B-12 (CYANOCOBALAMIN) 500 MCG  tablet Take 2 tablets (1,000 mcg total) by mouth daily. 30 tablet 0   No current facility-administered medications for this encounter.    BP (!) 152/96   Pulse 82   Wt 187 lb 12.8 oz (85.2 kg)   SpO2 90%  BMI 35.48 kg/m  General: NAD Neck: Thick, no JVD, no thyromegaly or thyroid nodule.  Lungs: Clear to auscultation bilaterally with normal respiratory effort. CV: Nondisplaced PMI.  Heart regular S1/S2, no S3/S4, no murmur.  Trace ankle edema.  No carotid bruit.  Normal pedal pulses.  Abdomen: Soft, nontender, no hepatosplenomegaly, no distention.  Skin: Intact without lesions or rashes.  Neurologic: Alert and oriented x 3.  Psych: Normal affect. Extremities: No clubbing or cyanosis.  HEENT: Normal.   Assessment/Plan:  1. Pulmonary hypertension with RV failure/cor pulmonale: This is severe.  I suspect that the pulmonary hypertension is a combination of group 2 (diastolic CHF) and primarily group 3 PH (OHS/OSA).  He is on high flow oxygen at all times but cannot tolerate CPAP.  High resolution CT chest was not suggestive of ILD.  V/Q scan did not show chronic PEs in 2016. On exam today, he does not appear volume overloaded.  - Continue high flow oxygen - Increase sildenafil to 80 mg tid.  Given suspected group 3 disease, I will not add additional pulmonary vasodilators.  - Continue torsemide 80 mg daily.  - He requires midodrine to keep BP up.  - I will arrange for PFTs.  - Followup with pulmonary.  2. Chronic diastolic CHF: In addition to RV failure, RHC showed elevated PCWP. EF 60-65% on echo.  Volume looks ok today.  - Continue torsemide as above. BMET today.  3. CKD: Stage 3.  BMET today.  4. GI bleeding: Capsule negative in 6/19.  1 episode of maroon stool since discharge.  - CBC today.  - Followup with GI.  5. Cholelithiasis: His abdomen is nontender, no fever, no pain after meals.  At this point, I would avoid cholecystectomy as he would be at high risk for complications.   Would only operate if absolutely necessary.  6. Nephrolithiasis: Needs urological procedure. If this can be done with minimal sedation, would be acceptable.  7. 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  - Continue atorvastatin 40 mg daily.  - Hold off on ASA 81 with recent GI bleeding.   Followup in 3 wks with NP/PA and 2 months with me.   Loralie Champagne 10/23/2017

## 2017-10-25 ENCOUNTER — Telehealth (HOSPITAL_COMMUNITY): Payer: Self-pay | Admitting: *Deleted

## 2017-10-25 DIAGNOSIS — Z1212 Encounter for screening for malignant neoplasm of rectum: Secondary | ICD-10-CM | POA: Diagnosis not present

## 2017-10-25 DIAGNOSIS — Z1211 Encounter for screening for malignant neoplasm of colon: Secondary | ICD-10-CM | POA: Diagnosis not present

## 2017-10-25 NOTE — Telephone Encounter (Signed)
Completed PA for pt's Sildenafil 80 mg TID, med approved through 05/02/18, qty 360 for 30 day supply, message sent to pharmacy

## 2017-10-27 ENCOUNTER — Other Ambulatory Visit: Payer: Self-pay

## 2017-10-27 DIAGNOSIS — N3289 Other specified disorders of bladder: Secondary | ICD-10-CM | POA: Diagnosis not present

## 2017-10-27 DIAGNOSIS — R350 Frequency of micturition: Secondary | ICD-10-CM | POA: Diagnosis not present

## 2017-10-27 DIAGNOSIS — N200 Calculus of kidney: Secondary | ICD-10-CM | POA: Diagnosis not present

## 2017-10-27 NOTE — Patient Outreach (Signed)
[place] [facility]) Care Management  10/27/2017  Tony Jones 1961-01-29 [id number]   First outreach attempt to patient's mother/legal guardian regarding social work referral.  No answer and unable to leave voicemail message.   Will make second call attempt within four business days.  Unsuccessful outreach letter mailed.   Ronn Melena, BSW Social Worker [phone]

## 2017-10-27 NOTE — Patient Outreach (Addendum)
[place] [facility]) Care Management  10/27/2017  AVROM ROBARTS [birth year] [id number]   Referral received 10/11/17 per A. [place] [facility], hospital liaison for [facility] Coordination needs.  Assessment: 57 year old with history of heart failure, pulmonary hypertension, chronic hypoxic respiratory failure, DM, developmental delay, muscular dystophy, Barrett's esophagus. Per chart admitted 5/31-6/13 with: heart failure; chronic hypoxemic respiratory failure; guiac positive anema.  RNCM called to follow up. No answer. Unable to leave message.  Plan: follow up in 3-4 business days.  Thea Silversmith, RN, MSN, [place] Coordinator Cell: [phone]

## 2017-10-28 DIAGNOSIS — N183 Chronic kidney disease, stage 3 (moderate): Secondary | ICD-10-CM | POA: Diagnosis not present

## 2017-10-28 DIAGNOSIS — I50810 Right heart failure, unspecified: Secondary | ICD-10-CM | POA: Diagnosis not present

## 2017-10-28 DIAGNOSIS — K8020 Calculus of gallbladder without cholecystitis without obstruction: Secondary | ICD-10-CM | POA: Diagnosis not present

## 2017-10-28 DIAGNOSIS — J9611 Chronic respiratory failure with hypoxia: Secondary | ICD-10-CM | POA: Diagnosis not present

## 2017-10-28 DIAGNOSIS — D631 Anemia in chronic kidney disease: Secondary | ICD-10-CM | POA: Diagnosis not present

## 2017-10-28 DIAGNOSIS — K2270 Barrett's esophagus without dysplasia: Secondary | ICD-10-CM | POA: Diagnosis not present

## 2017-10-28 DIAGNOSIS — I2510 Atherosclerotic heart disease of native coronary artery without angina pectoris: Secondary | ICD-10-CM | POA: Diagnosis not present

## 2017-10-28 DIAGNOSIS — G7100 Muscular dystrophy, unspecified: Secondary | ICD-10-CM | POA: Diagnosis not present

## 2017-10-28 DIAGNOSIS — I130 Hypertensive heart and chronic kidney disease with heart failure and stage 1 through stage 4 chronic kidney disease, or unspecified chronic kidney disease: Secondary | ICD-10-CM | POA: Diagnosis not present

## 2017-10-28 DIAGNOSIS — E1122 Type 2 diabetes mellitus with diabetic chronic kidney disease: Secondary | ICD-10-CM | POA: Diagnosis not present

## 2017-10-28 DIAGNOSIS — I272 Pulmonary hypertension, unspecified: Secondary | ICD-10-CM | POA: Diagnosis not present

## 2017-10-28 DIAGNOSIS — R6250 Unspecified lack of expected normal physiological development in childhood: Secondary | ICD-10-CM | POA: Diagnosis not present

## 2017-11-01 ENCOUNTER — Other Ambulatory Visit: Payer: Self-pay

## 2017-11-01 NOTE — Patient Outreach (Signed)
[facility]) Care Management  11/01/2017  IOAN LANDINI 29-Jan-1961 [id number]   BSW spoke with patient's legal guardian,  Tal Kempker, regarding social work referral for in-home aide services.  Per Mrs. Laurance Flatten, in-home aide services would be beneficial but she would really like for Mr. Poitras to be involved in a day program but she is not sure they can afford to private pay for this service.  Mrs. Treanor reported that he used to have Medicaid but when his father passed away three years ago he no longer qualified due to an increase in his social security check.  Mrs. Ace said, however, that she would like to apply again so the application was mailed to her.  Per Mrs. Laurance Flatten, her other son is researching [employer] to determine if this is something they could afford to private pay for.  BSW called [employer] and [employer] for a price quote but they were unable to provide this information because it is based on the individuals needs.  BSW mailed contact information for both of these agencies in order for Mrs. Sykora to contact them regarding cost. BSW provided Mrs. Misko with contact information for RNCM, Thea Silversmith, who has been attempting to reach her.  Mrs. Gertz said that she would call [place] today.   BSW will follow up with Mrs. Levengood next week to ensure receipt of information mailed.   Ronn Melena, BSW Social Worker [phone]

## 2017-11-02 ENCOUNTER — Other Ambulatory Visit: Payer: Self-pay

## 2017-11-02 NOTE — Patient Outreach (Signed)
[place] [facility]) Care Management  11/02/2017  Tony Jones May 17, 1960 [id number]   RNCM called to follow up. Spoke with client's mother/guardian. [name] reports they are still at the [facility] and that client is doing well. She reports client they obtained the Inogene tank and denies any problem with his oxygenation or blood pressure.  She reports that client has an appointment on Monday 11/07/17 and they will be back in town then.  Plan follow up telephonically next week.  Thea Silversmith, RN, MSN, [place] Coordinator Cell: [phone]

## 2017-11-07 ENCOUNTER — Ambulatory Visit (HOSPITAL_COMMUNITY)
Admission: RE | Admit: 2017-11-07 | Discharge: 2017-11-07 | Disposition: A | Discharge status: Home / Self Care | Payer: PPO | Source: Ambulatory Visit | Attending: Cardiology | Admitting: Cardiology

## 2017-11-07 DIAGNOSIS — I5032 Chronic diastolic (congestive) heart failure: Secondary | ICD-10-CM

## 2017-11-07 DIAGNOSIS — I272 Pulmonary hypertension, unspecified: Secondary | ICD-10-CM

## 2017-11-07 MED ORDER — ALBUTEROL SULFATE (2.5 MG/3ML) 0.083% IN NEBU: 2.5000 mg | INHALATION_SOLUTION | Freq: Once | RESPIRATORY_TRACT | Status: DC

## 2017-11-07 NOTE — Progress Notes (Signed)
Respiratory Therapy / Pulmonary Function Study Pt unable to follow commands to do PFT study. Poor effort on FVC  attempts

## 2017-11-08 ENCOUNTER — Ambulatory Visit: Payer: Self-pay

## 2017-11-08 ENCOUNTER — Other Ambulatory Visit: Payer: Self-pay

## 2017-11-08 NOTE — Progress Notes (Signed)
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  Patient was admitted in 6/19 with acute on chronic diastolic CHF with prominent RV failure.  RHC showed elevated right and left heart filling pressures with severe pulmonary arterial hypertension.  Patient is on high flow oxygen for OHS, he cannot tolerate CPAP. He was diuresed extensively.  He had upper GI bleeding.  Capsule study was done in the hospital but was unrevealing.    He lives with his mother who takes care of him and helps with ADLs.  He presents today for regular follow up. He is doing well overall. Mother and pt both think he is doing better since increase in Sildenafil. They have been able to wean his O2 to 6-8 lpm at home from 10. They have finished HHPT but continue to do his exercises at home. He continues to have lower abdominal pain with his kidney stones, but is getting a Cystoscopy/ureteroscopy on Monday. Taking all medications as directed.   ECG (5/19, personally reviewed): NSR, RVH, inferior TWIs  Labs (6/19): K 3.6, creatinine 1.5  PMH: 1. Developmental delay.  2. CKD: Stage 3.  3. Barretts esophagus with GERD 4. Cholelithiasis 5. Nephrolithiasis 6. OHS/OSA: Wears high flow oxygen at all times, cannot tolerate CPAP.  7. Type II diabetes.  8. HTN 9. Hyperlipidemia 10. Upper GI bleeding: Capsule study in 6/19 was negative.  11. Coronary disease: Nonobstructive.  - LHC (6/19) with 50% ostial PDA, otherwise luminal irregularities.  12. Fe deficiency anemia: Due to GI blood losses.  13. 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   - RHC (6/19): mean RA 16, PA 93/51 mean 67, mean 27, CI 2.65, PVR 8 WU - V/Q scan (2016): No  evidence for chronic PE.  - High resolution chest CT (6/19): no definite ILD.  - Echo (5/19): EF 60-65%, mild LVH, septal flattening with RV failure, PASP 75 mmhg.  14. Chronic diastolic CHF 15. ABIs (9/17): normal  Review of systems complete and found to be negative unless listed in HPI.    Social History   Socioeconomic History  . Marital status: Single    Spouse name: Not on file  . Number of children: Not on file  . Years of education: Not on file  . Highest education level: Not on file  Occupational History  . Occupation: unemployed  Social Needs  . Financial resource strain: Not on file  . Food insecurity:    Worry: Not on file    Inability: Not on file  . Transportation needs:    Medical: Not on file    Non-medical: Not on file  Tobacco Use  . Smoking status: Never Smoker  . Smokeless tobacco: Never Used  Substance and Sexual Activity  . Alcohol use: No  . Drug use: No  . Sexual activity: Never  Lifestyle  . Physical activity:    Days per week: Not on file    Minutes per session: Not on file  . Stress: Not on file  Relationships  . Social connections:    Talks on phone: Not on file    Gets together: Not on file    Attends religious service: Not on file    Active member of club or  organization: Not on file    Attends meetings of clubs or organizations: Not on file    Relationship status: Not on file  . Intimate partner violence:    Fear of current or ex partner: Not on file    Emotionally abused: Not on file    Physically abused: Not on file    Forced sexual activity: Not on file  Other Topics Concern  . Not on file  Social History Narrative  . Not on file   Family History  Problem Relation Age of Onset  . Congestive Heart Failure Father   . Heart disease Father   . Heart attack Father   . Atrial fibrillation Father   . Brain cancer Father   . Melanoma Father   . Diabetes Mother   . Stroke Mother   . Hypertension Mother   . Fainting Paternal  Grandfather   . Congestive Heart Failure Paternal Grandfather   . Atrial fibrillation Paternal Grandfather    Current Outpatient Medications  Medication Sig Dispense Refill  . acetaminophen (TYLENOL) 650 MG CR tablet Take 1,300 mg by mouth daily as needed for pain.    Marland Kitchen allopurinol (ZYLOPRIM) 300 MG tablet Take 300 mg by mouth daily with supper.     Marland Kitchen atorvastatin (LIPITOR) 40 MG tablet Take 1 tablet (40 mg total) by mouth daily at 6 PM. 30 tablet 6  . diclofenac sodium (VOLTAREN) 1 % GEL Apply 1 application topically 2 (two) times daily as needed (leg pain).    Marland Kitchen docusate sodium (COLACE) 100 MG capsule Take 100 mg by mouth daily as needed for mild constipation.     . fesoterodine (TOVIAZ) 8 MG TB24 tablet Take 8 mg by mouth daily with supper.     . finasteride (PROSCAR) 5 MG tablet Take 5 mg by mouth daily.    Marland Kitchen glimepiride (AMARYL) 2 MG tablet Take 2 mg by mouth daily with breakfast.    . levocetirizine (XYZAL) 5 MG tablet Take 5 mg by mouth daily with breakfast.     . meclizine (ANTIVERT) 25 MG tablet Take 25 mg by mouth 3 (three) times daily as needed for dizziness.   0  . metFORMIN (GLUCOPHAGE) 1000 MG tablet Take 1,000 mg by mouth 2 (two) times daily with a meal.  1  . midodrine (PROAMATINE) 10 MG tablet Take 10 mg by mouth 3 (three) times daily with meals.    . Multiple Vitamin (MULTIVITAMIN WITH MINERALS) TABS tablet Take 1 tablet by mouth daily with breakfast. [place] MV    . oxybutynin (DITROPAN) 5 MG tablet Take 5 mg by mouth 2 (two) times daily with a meal.     . pantoprazole (PROTONIX) 40 MG tablet Take 40 mg by mouth 2 (two) times daily with a meal.     . potassium chloride SA (K-DUR,KLOR-CON) 20 MEQ tablet Take 1 tablet (20 mEq total) by mouth daily. 30 tablet 3  . Propylene Glycol (SYSTANE BALANCE OP) Place 1 drop into both eyes 2 (two) times daily as needed (dry eyes).    . sertraline (ZOLOFT) 100 MG tablet Take 100 mg by mouth daily with breakfast.  1  . tiZANidine  (ZANAFLEX) 2 MG tablet Take 1 mg by mouth every 6 (six) hours as needed (leg cramps).     . torsemide (DEMADEX) 20 MG tablet Take 4 tablets (80 mg total) by mouth daily. 120 tablet 3  . sildenafil (REVATIO) 20 MG tablet Take 4 tablets (80 mg total) by mouth 3 (three)  times daily. (Patient not taking: Reported on 11/11/2017) 360 tablet 3   No current facility-administered medications for this encounter.    Vitals:   11/11/17 1102  BP: 122/84  Pulse: 96  SpO2: 97%  Weight: 177 lb (80.3 kg)   Wt Readings from Last 3 Encounters:  11/11/17 177 lb (80.3 kg)  11/10/17 178 lb (80.7 kg)  10/21/17 187 lb 12.8 oz (85.2 kg)   General: Well appearing. No resp difficulty. HEENT: Normal Neck: Supple. JVP 5-6. Carotids 2+ bilat; no bruits. No thyromegaly or nodule noted. Cor: PMI nondisplaced. RRR, No M/G/R noted Lungs: CTAB, normal effort. Abdomen: Soft, non-tender, non-distended, no HSM. No bruits or masses. +BS  Extremities: No cyanosis, clubbing, or rash. R and LLE no edema.  Neuro: Alert & orientedx3, cranial nerves grossly intact. moves all 4 extremities w/o difficulty. Affect pleasant   Assessment/Plan:  1. Pulmonary hypertension with RV failure/cor pulmonale:  - Suspect that the pulmonary hypertension is a combination of group 2 (diastolic CHF) and primarily group 3 PH (OHS/OSA).  He is on high flow oxygen at all times but cannot tolerate CPAP.  High resolution CT chest was not suggestive of ILD.  V/Q scan did not show chronic PEs in 2016.  - Volume status stable on exam.   - Continue high flow oxygen - Continue sildenafil to 80 mg tid.  Given suspected group 3 disease, I will not add additional pulmonary vasodilators.  - Continue torsemide 80 mg daily.  - He requires midodrine to keep BP up.  - Unable to perform PFTs.  - Followup with pulmonary.  2. Chronic diastolic CHF:  - In addition to RV failure, RHC showed elevated PCWP.  - Echo 09/28/2017 EF 60-65% on echo.   - NYHA III  symptoms currently. - Volume status stable  - Continue torsemide as above.  Reinforced fluid restriction to < 2 L daily, sodium restriction to less than 2000 mg daily, and the importance of daily weights.    3. CKD: Stage 3 - BMET today.  4. GI bleeding: Capsule negative in 6/19.  1 episode of maroon stool since discharge.  - CBC stable 3 weeks ago.  - Follows with GI.  5. Cholelithiasis:  - Abdomen is stable.  - At this point, would avoid cholecystectomy as he would be at high risk for complications.  Would only operate if absolutely necessary.  6. Nephrolithiasis: - Needs urological procedure. If this can be done with minimal sedation, would be acceptable.  - No change to current plan.   7. 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  - Continue atorvastatin 40 mg daily.  - Hold off on ASA 81 with recent GI bleeding.   Keep 6-8 week follow up with Dr. Aundra Dubin. Labs yesterday stable. No changes today.  Shirley Friar, PA-C  11/11/2017

## 2017-11-08 NOTE — Patient Outreach (Signed)
[place] [facility]) Care Management  11/08/2017  RUCKER PRIDGEON 04/22/1961 [id number]   BSW attempted to follow up with patient's guardian, Sharmarke Cicio, to ensure receipt of resources mailed but was unable to contact.  Will make second attempt within four business days.  Ronn Melena, BSW Social Worker [phone]

## 2017-11-09 ENCOUNTER — Other Ambulatory Visit: Payer: Self-pay | Admitting: Urology

## 2017-11-09 DIAGNOSIS — I5032 Chronic diastolic (congestive) heart failure: Secondary | ICD-10-CM | POA: Diagnosis not present

## 2017-11-09 DIAGNOSIS — R0902 Hypoxemia: Secondary | ICD-10-CM | POA: Diagnosis not present

## 2017-11-09 DIAGNOSIS — J9611 Chronic respiratory failure with hypoxia: Secondary | ICD-10-CM | POA: Diagnosis not present

## 2017-11-10 ENCOUNTER — Encounter (HOSPITAL_COMMUNITY)
Admission: RE | Admit: 2017-11-10 | Discharge: 2017-11-10 | Disposition: A | Discharge status: Home / Self Care | Payer: PPO | Source: Ambulatory Visit | Attending: Urology | Admitting: Urology

## 2017-11-10 ENCOUNTER — Encounter (HOSPITAL_COMMUNITY): Payer: Self-pay

## 2017-11-10 DIAGNOSIS — K8020 Calculus of gallbladder without cholecystitis without obstruction: Secondary | ICD-10-CM | POA: Diagnosis not present

## 2017-11-10 DIAGNOSIS — R6250 Unspecified lack of expected normal physiological development in childhood: Secondary | ICD-10-CM | POA: Diagnosis not present

## 2017-11-10 DIAGNOSIS — Z79899 Other long term (current) drug therapy: Secondary | ICD-10-CM | POA: Diagnosis not present

## 2017-11-10 DIAGNOSIS — I2510 Atherosclerotic heart disease of native coronary artery without angina pectoris: Secondary | ICD-10-CM | POA: Diagnosis not present

## 2017-11-10 DIAGNOSIS — E1122 Type 2 diabetes mellitus with diabetic chronic kidney disease: Secondary | ICD-10-CM | POA: Diagnosis not present

## 2017-11-10 DIAGNOSIS — I5032 Chronic diastolic (congestive) heart failure: Secondary | ICD-10-CM | POA: Diagnosis not present

## 2017-11-10 DIAGNOSIS — I130 Hypertensive heart and chronic kidney disease with heart failure and stage 1 through stage 4 chronic kidney disease, or unspecified chronic kidney disease: Secondary | ICD-10-CM | POA: Diagnosis not present

## 2017-11-10 DIAGNOSIS — I2781 Cor pulmonale (chronic): Secondary | ICD-10-CM | POA: Diagnosis not present

## 2017-11-10 DIAGNOSIS — K922 Gastrointestinal hemorrhage, unspecified: Secondary | ICD-10-CM | POA: Diagnosis not present

## 2017-11-10 DIAGNOSIS — Z7984 Long term (current) use of oral hypoglycemic drugs: Secondary | ICD-10-CM | POA: Diagnosis not present

## 2017-11-10 DIAGNOSIS — N183 Chronic kidney disease, stage 3 (moderate): Secondary | ICD-10-CM | POA: Diagnosis not present

## 2017-11-10 DIAGNOSIS — Z87442 Personal history of urinary calculi: Secondary | ICD-10-CM | POA: Diagnosis not present

## 2017-11-10 DIAGNOSIS — G4733 Obstructive sleep apnea (adult) (pediatric): Secondary | ICD-10-CM | POA: Diagnosis not present

## 2017-11-10 DIAGNOSIS — E785 Hyperlipidemia, unspecified: Secondary | ICD-10-CM | POA: Diagnosis not present

## 2017-11-10 DIAGNOSIS — Z8249 Family history of ischemic heart disease and other diseases of the circulatory system: Secondary | ICD-10-CM | POA: Diagnosis not present

## 2017-11-10 DIAGNOSIS — K2270 Barrett's esophagus without dysplasia: Secondary | ICD-10-CM | POA: Diagnosis not present

## 2017-11-10 DIAGNOSIS — I272 Pulmonary hypertension, unspecified: Secondary | ICD-10-CM | POA: Diagnosis not present

## 2017-11-10 DIAGNOSIS — K219 Gastro-esophageal reflux disease without esophagitis: Secondary | ICD-10-CM | POA: Diagnosis not present

## 2017-11-10 LAB — HEMOGLOBIN A1C
Hgb A1c MFr Bld: 4.7 % — ABNORMAL LOW (ref 4.8–5.6)
Mean Plasma Glucose: 88.19 mg/dL

## 2017-11-10 LAB — CBC
HCT: 37.8 % — ABNORMAL LOW (ref 39.0–52.0)
Hemoglobin: 12.1 g/dL — ABNORMAL LOW (ref 13.0–17.0)
MCH: 27.9 pg (ref 26.0–34.0)
MCHC: 32 g/dL (ref 30.0–36.0)
MCV: 87.3 fL (ref 78.0–100.0)
PLATELETS: 256 10*3/uL (ref 150–400)
RBC: 4.33 MIL/uL (ref 4.22–5.81)
RDW: 22.1 % — ABNORMAL HIGH (ref 11.5–15.5)
WBC: 8.8 10*3/uL (ref 4.0–10.5)

## 2017-11-10 LAB — BASIC METABOLIC PANEL
Anion gap: 12 (ref 5–15)
BUN: 35 mg/dL — ABNORMAL HIGH (ref 6–20)
CHLORIDE: 93 mmol/L — AB (ref 98–111)
CO2: 35 mmol/L — AB (ref 22–32)
CREATININE: 1.56 mg/dL — AB (ref 0.61–1.24)
Calcium: 9.2 mg/dL (ref 8.9–10.3)
GFR calc non Af Amer: 48 mL/min — ABNORMAL LOW (ref >60)
GFR, EST AFRICAN AMERICAN: 56 mL/min — AB (ref >60)
GLUCOSE: 50 mg/dL — AB (ref 70–99)
Potassium: 4.2 mmol/L (ref 3.5–5.1)
Sodium: 140 mmol/L (ref 135–145)

## 2017-11-10 NOTE — Progress Notes (Signed)
10/21/2017-Labs in Epic- CBC, BMP  10/19/2017- Clearance on chart from Dr. Loralie Champagne.  09/30/2017- noted in [place]  09/28/2017- noted in [place]  10/05/2017- noted in [place] and Right /Left heart cath.

## 2017-11-10 NOTE — Patient Instructions (Addendum)
Tony Jones  11/10/2017   Your procedure is scheduled on: Monday 11/14/2017  Report to [facility] Main  Entrance  Report to admitting at   0830AM    Call this number if you have problems the morning of surgery [phone]    Remember: Do not eat food or drink liquids :After Midnight.     Take these medicines the morning of surgery with A SIP OF WATER: Proamatine, Protonix, Zolft, Use Eye Drops,  DO NOT TAKE ANY DIABETIC MEDICATIONS DAY OF YOUR SURGERY                               You may not have any metal on your body including hair pins and              piercings  Do not wear jewelry, make-up, lotions, powders or perfumes, deodorant             Do not wear nail polish.  Do not shave  48 hours prior to surgery.              Men may shave face and neck.   Do not bring valuables to the hospital. [place].  Contacts, dentures or bridgework may not be worn into surgery.  Leave suitcase in the car. After surgery it may be brought to your room.     Patients discharged the day of surgery will not be allowed to drive home.  Name and phone number of your driver:  Special Instructions: N/A              Please read over the following fact sheets you were given: _____________________________________________________________________             [facility] - Preparing for Surgery Before surgery, you can play an important role.  Because skin is not sterile, your skin needs to be as free of germs as possible.  You can reduce the number of germs on your skin by washing with CHG (chlorahexidine gluconate) soap before surgery.  CHG is an antiseptic cleaner which kills germs and bonds with the skin to continue killing germs even after washing. Please DO NOT use if you have an allergy to CHG or antibacterial soaps.  If your skin becomes reddened/irritated stop using the CHG and inform your nurse when you arrive at Short  Stay. Do not shave (including legs and underarms) for at least 48 hours prior to the first CHG shower.  You may shave your face/neck. Please follow these instructions carefully:  1.  Shower with CHG Soap the night before surgery and the  morning of Surgery.  2.  If you choose to wash your hair, wash your hair first as usual with your  normal  shampoo.  3.  After you shampoo, rinse your hair and body thoroughly to remove the  shampoo.                           4.  Use CHG as you would any other liquid soap.  You can apply chg directly  to the skin and wash  Gently with a scrungie or clean washcloth.  5.  Apply the CHG Soap to your body ONLY FROM THE NECK DOWN.   Do not use on face/ open                           Wound or open sores. Avoid contact with eyes, ears mouth and genitals (private parts).                       Wash face,  Genitals (private parts) with your normal soap.             6.  Wash thoroughly, paying special attention to the area where your surgery  will be performed.  7.  Thoroughly rinse your body with warm water from the neck down.  8.  DO NOT shower/wash with your normal soap after using and rinsing off  the CHG Soap.                9.  Pat yourself dry with a clean towel.            10.  Wear clean pajamas.            11.  Place clean sheets on your bed the night of your first shower and do not  sleep with pets. Day of Surgery : Do not apply any lotions/deodorants the morning of surgery.  Please wear clean clothes to the hospital/surgery center.  FAILURE TO FOLLOW THESE INSTRUCTIONS MAY RESULT IN THE CANCELLATION OF YOUR SURGERY PATIENT SIGNATURE_________________________________  NURSE SIGNATURE__________________________________  ________________________________________________________________________ [facility] - Preparing for Surgery Before surgery, you can play an important role.  Because skin is not sterile, your skin needs to be as free of  germs as possible.  You can reduce the number of germs on your skin by washing with CHG (chlorahexidine gluconate) soap before surgery.  CHG is an antiseptic cleaner which kills germs and bonds with the skin to continue killing germs even after washing. Please DO NOT use if you have an allergy to CHG or antibacterial soaps.  If your skin becomes reddened/irritated stop using the CHG and inform your nurse when you arrive at Short Stay. Do not shave (including legs and underarms) for at least 48 hours prior to the first CHG shower.  You may shave your face/neck. Please follow these instructions carefully:  1.  Shower with CHG Soap the night before surgery and the  morning of Surgery.  2.  If you choose to wash your hair, wash your hair first as usual with your  normal  shampoo.  3.  After you shampoo, rinse your hair and body thoroughly to remove the  shampoo.                           4.  Use CHG as you would any other liquid soap.  You can apply chg directly  to the skin and wash                       Gently with a scrungie or clean washcloth.  5.  Apply the CHG Soap to your body ONLY FROM THE NECK DOWN.   Do not use on face/ open                           Wound or  open sores. Avoid contact with eyes, ears mouth and genitals (private parts).                       Wash face,  Genitals (private parts) with your normal soap.             6.  Wash thoroughly, paying special attention to the area where your surgery  will be performed.  7.  Thoroughly rinse your body with warm water from the neck down.  8.  DO NOT shower/wash with your normal soap after using and rinsing off  the CHG Soap.                9.  Pat yourself dry with a clean towel.            10.  Wear clean pajamas.            11.  Place clean sheets on your bed the night of your first shower and do not  sleep with pets. Day of Surgery : Do not apply any lotions/deodorants the morning of surgery.  Please wear clean clothes to the  hospital/surgery center.  FAILURE TO FOLLOW THESE INSTRUCTIONS MAY RESULT IN THE CANCELLATION OF YOUR SURGERY PATIENT SIGNATURE_________________________________  NURSE SIGNATURE__________________________________  ________________________________________________________________________

## 2017-11-11 ENCOUNTER — Ambulatory Visit (HOSPITAL_COMMUNITY)
Admission: RE | Admit: 2017-11-11 | Discharge: 2017-11-11 | Disposition: A | Discharge status: Home / Self Care | Payer: PPO | Source: Ambulatory Visit | Attending: Cardiology | Admitting: Cardiology

## 2017-11-11 ENCOUNTER — Other Ambulatory Visit: Payer: Self-pay

## 2017-11-11 ENCOUNTER — Ambulatory Visit: Payer: Self-pay

## 2017-11-11 ENCOUNTER — Encounter (HOSPITAL_COMMUNITY): Payer: Self-pay

## 2017-11-11 VITALS — BP 122/84 | HR 96 | Wt 177.0 lb

## 2017-11-11 DIAGNOSIS — I272 Pulmonary hypertension, unspecified: Secondary | ICD-10-CM | POA: Diagnosis not present

## 2017-11-11 DIAGNOSIS — E1122 Type 2 diabetes mellitus with diabetic chronic kidney disease: Secondary | ICD-10-CM | POA: Insufficient documentation

## 2017-11-11 DIAGNOSIS — Z87442 Personal history of urinary calculi: Secondary | ICD-10-CM | POA: Insufficient documentation

## 2017-11-11 DIAGNOSIS — I130 Hypertensive heart and chronic kidney disease with heart failure and stage 1 through stage 4 chronic kidney disease, or unspecified chronic kidney disease: Secondary | ICD-10-CM | POA: Insufficient documentation

## 2017-11-11 DIAGNOSIS — I5032 Chronic diastolic (congestive) heart failure: Secondary | ICD-10-CM | POA: Insufficient documentation

## 2017-11-11 DIAGNOSIS — G4733 Obstructive sleep apnea (adult) (pediatric): Secondary | ICD-10-CM | POA: Insufficient documentation

## 2017-11-11 DIAGNOSIS — Z8249 Family history of ischemic heart disease and other diseases of the circulatory system: Secondary | ICD-10-CM | POA: Insufficient documentation

## 2017-11-11 DIAGNOSIS — I50812 Chronic right heart failure: Secondary | ICD-10-CM | POA: Diagnosis not present

## 2017-11-11 DIAGNOSIS — E118 Type 2 diabetes mellitus with unspecified complications: Secondary | ICD-10-CM | POA: Diagnosis not present

## 2017-11-11 DIAGNOSIS — I2781 Cor pulmonale (chronic): Secondary | ICD-10-CM | POA: Insufficient documentation

## 2017-11-11 DIAGNOSIS — K219 Gastro-esophageal reflux disease without esophagitis: Secondary | ICD-10-CM | POA: Insufficient documentation

## 2017-11-11 DIAGNOSIS — K2270 Barrett's esophagus without dysplasia: Secondary | ICD-10-CM | POA: Insufficient documentation

## 2017-11-11 DIAGNOSIS — Z7984 Long term (current) use of oral hypoglycemic drugs: Secondary | ICD-10-CM | POA: Insufficient documentation

## 2017-11-11 DIAGNOSIS — E668 Other obesity: Secondary | ICD-10-CM

## 2017-11-11 DIAGNOSIS — K922 Gastrointestinal hemorrhage, unspecified: Secondary | ICD-10-CM | POA: Insufficient documentation

## 2017-11-11 DIAGNOSIS — I2510 Atherosclerotic heart disease of native coronary artery without angina pectoris: Secondary | ICD-10-CM | POA: Insufficient documentation

## 2017-11-11 DIAGNOSIS — Z79899 Other long term (current) drug therapy: Secondary | ICD-10-CM | POA: Insufficient documentation

## 2017-11-11 DIAGNOSIS — N183 Chronic kidney disease, stage 3 (moderate): Secondary | ICD-10-CM | POA: Insufficient documentation

## 2017-11-11 DIAGNOSIS — K8020 Calculus of gallbladder without cholecystitis without obstruction: Secondary | ICD-10-CM | POA: Insufficient documentation

## 2017-11-11 DIAGNOSIS — R6250 Unspecified lack of expected normal physiological development in childhood: Secondary | ICD-10-CM | POA: Insufficient documentation

## 2017-11-11 DIAGNOSIS — E785 Hyperlipidemia, unspecified: Secondary | ICD-10-CM | POA: Insufficient documentation

## 2017-11-11 NOTE — Patient Instructions (Signed)
No changes at this time, you're doing great!  Follow up as scheduled.  Take all medication as prescribed the day of your appointment. Bring all medications with you to your appointment.  Do the following things EVERYDAY: 1) Weigh yourself in the morning before breakfast. Write it down and keep it in a log. 2) Take your medicines as prescribed 3) Eat low salt foods-Limit salt (sodium) to 2000 mg per day.  4) Stay as active as you can everyday 5) Limit all fluids for the day to less than 2 liters

## 2017-11-11 NOTE — Patient Outreach (Signed)
[place] [facility]) Care Management  11/11/2017  Tony Jones 26-Apr-1961 [id number]  57 year old with history of heart failure, pulmonary hypertension, chronic hypoxic respiratory failure, DM, developmental delay, muscular dystophy, Barrett's esophagus. Per chartadmitted 5/31-6/13 with: heart failure; chronic hypoxemic respiratory failure; guiac positive anema.  RNCM called to follow up. No answer. Unable to leave message.  Plan: follow up telephonically next week.  Thea Silversmith, RN, MSN, [place] Coordinator Cell: [phone]

## 2017-11-11 NOTE — Patient Outreach (Signed)
[place] [facility]) Care Management  11/11/2017  KEAHI MCCARNEY 1961-02-10 [id number]   Second follow up attempt to patient's guardian regarding resources mailed.  BSW left voicemail message.  Will make third attempt within four business days.  Ronn Melena, BSW Social Worker [phone]

## 2017-11-14 ENCOUNTER — Ambulatory Visit (HOSPITAL_COMMUNITY): Payer: PPO

## 2017-11-14 ENCOUNTER — Encounter (HOSPITAL_COMMUNITY)
Admission: RE | Disposition: A | Discharge status: Home / Self Care | Payer: Self-pay | Source: Ambulatory Visit | Attending: Urology

## 2017-11-14 ENCOUNTER — Other Ambulatory Visit: Payer: Self-pay

## 2017-11-14 ENCOUNTER — Encounter (HOSPITAL_COMMUNITY): Payer: Self-pay | Admitting: *Deleted

## 2017-11-14 ENCOUNTER — Ambulatory Visit (HOSPITAL_COMMUNITY): Payer: PPO | Admitting: Anesthesiology

## 2017-11-14 ENCOUNTER — Ambulatory Visit (HOSPITAL_COMMUNITY)
Admission: RE | Admit: 2017-11-14 | Discharge: 2017-11-14 | Disposition: A | Discharge status: Home / Self Care | Payer: PPO | Source: Ambulatory Visit | Attending: Urology | Admitting: Urology

## 2017-11-14 DIAGNOSIS — I50810 Right heart failure, unspecified: Secondary | ICD-10-CM | POA: Diagnosis not present

## 2017-11-14 DIAGNOSIS — G4733 Obstructive sleep apnea (adult) (pediatric): Secondary | ICD-10-CM | POA: Diagnosis not present

## 2017-11-14 DIAGNOSIS — N201 Calculus of ureter: Secondary | ICD-10-CM

## 2017-11-14 DIAGNOSIS — N132 Hydronephrosis with renal and ureteral calculous obstruction: Secondary | ICD-10-CM | POA: Insufficient documentation

## 2017-11-14 DIAGNOSIS — K219 Gastro-esophageal reflux disease without esophagitis: Secondary | ICD-10-CM | POA: Diagnosis not present

## 2017-11-14 DIAGNOSIS — G4730 Sleep apnea, unspecified: Secondary | ICD-10-CM | POA: Diagnosis not present

## 2017-11-14 DIAGNOSIS — Z7984 Long term (current) use of oral hypoglycemic drugs: Secondary | ICD-10-CM | POA: Insufficient documentation

## 2017-11-14 DIAGNOSIS — F419 Anxiety disorder, unspecified: Secondary | ICD-10-CM | POA: Insufficient documentation

## 2017-11-14 DIAGNOSIS — E119 Type 2 diabetes mellitus without complications: Secondary | ICD-10-CM | POA: Diagnosis not present

## 2017-11-14 DIAGNOSIS — I110 Hypertensive heart disease with heart failure: Secondary | ICD-10-CM | POA: Insufficient documentation

## 2017-11-14 DIAGNOSIS — E785 Hyperlipidemia, unspecified: Secondary | ICD-10-CM | POA: Diagnosis not present

## 2017-11-14 DIAGNOSIS — K8020 Calculus of gallbladder without cholecystitis without obstruction: Secondary | ICD-10-CM | POA: Diagnosis not present

## 2017-11-14 DIAGNOSIS — I272 Pulmonary hypertension, unspecified: Secondary | ICD-10-CM | POA: Insufficient documentation

## 2017-11-14 DIAGNOSIS — R6250 Unspecified lack of expected normal physiological development in childhood: Secondary | ICD-10-CM | POA: Diagnosis not present

## 2017-11-14 DIAGNOSIS — Z6833 Body mass index (BMI) 33.0-33.9, adult: Secondary | ICD-10-CM | POA: Diagnosis not present

## 2017-11-14 DIAGNOSIS — D696 Thrombocytopenia, unspecified: Secondary | ICD-10-CM

## 2017-11-14 DIAGNOSIS — I5032 Chronic diastolic (congestive) heart failure: Secondary | ICD-10-CM | POA: Diagnosis not present

## 2017-11-14 HISTORY — PX: CYSTOSCOPY/URETEROSCOPY/HOLMIUM LASER/STENT PLACEMENT: SHX6546

## 2017-11-14 LAB — GLUCOSE, CAPILLARY
Glucose-Capillary: 98 mg/dL (ref 70–99)
Glucose-Capillary: 113 mg/dL — ABNORMAL HIGH (ref 70–99)

## 2017-11-14 SURGERY — CYSTOSCOPY/URETEROSCOPY/HOLMIUM LASER/STENT PLACEMENT
Anesthesia: Monitor Anesthesia Care | Laterality: Right

## 2017-11-14 MED ORDER — DEXAMETHASONE SODIUM PHOSPHATE 10 MG/ML IJ SOLN
INTRAMUSCULAR | Status: DC | PRN
  Administered 2017-11-14: 4 mg via INTRAVENOUS

## 2017-11-14 MED ORDER — PROPOFOL 10 MG/ML IV BOLUS
INTRAVENOUS | Status: AC
Start: 2017-11-14 — End: ?
  Filled 2017-11-14: qty 60

## 2017-11-14 MED ORDER — SULFAMETHOXAZOLE-TRIMETHOPRIM 800-160 MG PO TABS: 1.0000 | ORAL_TABLET | Freq: Two times a day (BID) | ORAL | 0 refills | Status: DC

## 2017-11-14 MED ORDER — SODIUM CHLORIDE 0.9 % IR SOLN
Status: DC | PRN
  Administered 2017-11-14: 6000 mL

## 2017-11-14 MED ORDER — ONDANSETRON HCL 4 MG/2ML IJ SOLN
INTRAMUSCULAR | Status: DC | PRN
  Administered 2017-11-14: 4 mg via INTRAVENOUS

## 2017-11-14 MED ORDER — IOHEXOL 300 MG/ML  SOLN
INTRAMUSCULAR | Status: DC | PRN
  Administered 2017-11-14: 10 mL

## 2017-11-14 MED ORDER — FENTANYL CITRATE (PF) 100 MCG/2ML IJ SOLN
INTRAMUSCULAR | Status: AC
  Filled 2017-11-14: qty 2

## 2017-11-14 MED ORDER — DEXAMETHASONE SODIUM PHOSPHATE 10 MG/ML IJ SOLN
INTRAMUSCULAR | Status: AC
  Filled 2017-11-14: qty 1

## 2017-11-14 MED ORDER — PROPOFOL 10 MG/ML IV BOLUS
INTRAVENOUS | Status: DC | PRN
  Administered 2017-11-14: 150 mg via INTRAVENOUS

## 2017-11-14 MED ORDER — ONDANSETRON HCL 4 MG/2ML IJ SOLN
INTRAMUSCULAR | Status: AC
  Filled 2017-11-14: qty 2

## 2017-11-14 MED ORDER — LIDOCAINE 2% (20 MG/ML) 5 ML SYRINGE
INTRAMUSCULAR | Status: DC | PRN
  Administered 2017-11-14: 60 mg via INTRAVENOUS

## 2017-11-14 MED ORDER — GLYCOPYRROLATE PF 0.2 MG/ML IJ SOSY
PREFILLED_SYRINGE | INTRAMUSCULAR | Status: DC | PRN
  Administered 2017-11-14: .1 mg via INTRAVENOUS

## 2017-11-14 MED ORDER — CEFAZOLIN SODIUM-DEXTROSE 2-4 GM/100ML-% IV SOLN
2.0000 g | INTRAVENOUS | Status: AC
  Administered 2017-11-14: 2 g via INTRAVENOUS
  Filled 2017-11-14: qty 100

## 2017-11-14 MED ORDER — GLYCOPYRROLATE PF 0.2 MG/ML IJ SOSY
PREFILLED_SYRINGE | INTRAMUSCULAR | Status: AC
  Filled 2017-11-14: qty 1

## 2017-11-14 MED ORDER — LACTATED RINGERS IV SOLN
INTRAVENOUS | Status: DC
  Administered 2017-11-14: 09:00:00 via INTRAVENOUS

## 2017-11-14 MED ORDER — FENTANYL CITRATE (PF) 100 MCG/2ML IJ SOLN
INTRAMUSCULAR | Status: DC | PRN
  Administered 2017-11-14 (×2): 50 ug via INTRAVENOUS

## 2017-11-14 MED ORDER — LIDOCAINE 2% (20 MG/ML) 5 ML SYRINGE
INTRAMUSCULAR | Status: AC
  Filled 2017-11-14: qty 5

## 2017-11-14 SURGICAL SUPPLY — 25 items
BAG URO CATCHER STRL LF (MISCELLANEOUS) ×3 IMPLANT
BASKET LASER NITINOL 1.9FR (BASKET) ×5 IMPLANT
BASKET ZERO TIP NITINOL 2.4FR (BASKET) ×2 IMPLANT
BSKT STON RTRVL 120 1.9FR (BASKET) ×2
BSKT STON RTRVL ZERO TP 2.4FR (BASKET) ×1
CATH INTERMIT  6FR 70CM (CATHETERS) ×2 IMPLANT
CLOTH BEACON ORANGE TIMEOUT ST (SAFETY) ×3 IMPLANT
COVER FOOTSWITCH UNIV (MISCELLANEOUS) IMPLANT
COVER SURGICAL LIGHT HANDLE (MISCELLANEOUS) ×3 IMPLANT
FIBER LASER FLEXIVA 365 (UROLOGICAL SUPPLIES) IMPLANT
FIBER LASER TRAC TIP (UROLOGICAL SUPPLIES) IMPLANT
GLOVE BIOGEL M 8.0 STRL (GLOVE) ×3 IMPLANT
GOWN STRL REUS W/ TWL XL LVL3 (GOWN DISPOSABLE) ×1 IMPLANT
GOWN STRL REUS W/TWL LRG LVL3 (GOWN DISPOSABLE) ×6 IMPLANT
GOWN STRL REUS W/TWL XL LVL3 (GOWN DISPOSABLE) ×3
GUIDEWIRE ANG ZIPWIRE 038X150 (WIRE) ×3 IMPLANT
GUIDEWIRE STR DUAL SENSOR (WIRE) ×1 IMPLANT
IV NS 1000ML (IV SOLUTION)
IV NS 1000ML BAXH (IV SOLUTION) ×1 IMPLANT
MANIFOLD NEPTUNE II (INSTRUMENTS) ×3 IMPLANT
PACK CYSTO (CUSTOM PROCEDURE TRAY) ×3 IMPLANT
STENT CONTOUR 6FRX24X.038 (STENTS) ×2 IMPLANT
TUBING CONNECTING 10 (TUBING) ×2 IMPLANT
TUBING CONNECTING 10' (TUBING) ×1
TUBING UROLOGY SET (TUBING) ×3 IMPLANT

## 2017-11-14 NOTE — Transfer of Care (Signed)
Immediate Anesthesia Transfer of Care Note  Patient: Tony Jones  Procedure(s) Performed: CYSTOSCOPY/URETEROSCOPY/RETROGRADE/STENT PLACEMENT (Right )  Patient Location: PACU  Anesthesia Type:General  Level of Consciousness: awake, alert , oriented and patient cooperative  Airway & Oxygen Therapy: Patient Spontanous Breathing and Patient connected to face mask oxygen  Post-op Assessment: Report given to RN, Post -op Vital signs reviewed and stable and Patient moving all extremities  Post vital signs: Reviewed and stable  Last Vitals:  Vitals Value Taken Time  BP 141/99 11/14/2017 11:36 AM  Temp    Pulse 98 11/14/2017 11:37 AM  Resp 15 11/14/2017 11:37 AM  SpO2 100 % 11/14/2017 11:37 AM  Vitals shown include unvalidated device data.  Last Pain:  Vitals:   11/14/17 1137  TempSrc:   PainSc: (P) 0-No pain         Complications: No apparent anesthesia complications

## 2017-11-14 NOTE — Anesthesia Preprocedure Evaluation (Addendum)
Anesthesia Evaluation  Patient identified by MRN, date of birth, ID band Patient awake    Reviewed: Allergy & Precautions, NPO status   History of Anesthesia Complications Negative for: history of anesthetic complications  Airway Mallampati: II  TM Distance: >3 FB     Dental  (+) Dental Advisory Given, Poor Dentition   Pulmonary sleep apnea ,  Pulmonary hypertension    breath sounds clear to auscultation       Cardiovascular hypertension, +CHF   Rhythm:Regular Rate:Normal  10/15/2017 Conclusion   1. Severe pulmonary arterial hypertension.  2. Left and right heart filling pressures remain elevated, R>L.  3. Preserved cardiac output.  4. Nonobstructive coronary disease.      Neuro/Psych PSYCHIATRIC DISORDERS Anxiety Developmental delay    GI/Hepatic Neg liver ROS, GERD  ,  Endo/Other  diabetes, Type 2, Oral Hypoglycemic AgentsMorbid obesity  Renal/GU ARFRenal disease     Musculoskeletal   Abdominal   Peds  Hematology  (+) anemia ,   Anesthesia Other Findings   Reproductive/Obstetrics                           Anesthesia Physical  Anesthesia Plan  ASA: III and emergent  Anesthesia Plan: MAC   Post-op Pain Management:    Induction: Intravenous  PONV Risk Score and Plan: 2 and Treatment may vary due to age or medical condition  Airway Management Planned: Simple Face Mask, Nasal Cannula, Natural Airway and Mask  Additional Equipment:   Intra-op Plan:   Post-operative Plan:   Informed Consent: I have reviewed the patients History and Physical, chart, labs and discussed the procedure including the risks, benefits and alternatives for the proposed anesthesia with the patient or authorized representative who has indicated his/her understanding and acceptance.   Dental advisory given and Consent reviewed with POA  Plan Discussed with: CRNA, Anesthesiologist and  Surgeon  Anesthesia Plan Comments:        Anesthesia Quick Evaluation

## 2017-11-14 NOTE — Discharge Instructions (Signed)

## 2017-11-14 NOTE — Op Note (Addendum)
Preoperative diagnosis: Right distal ureteral calculi  Postoperative diagnosis: Same  Principal procedure: Cystoscopy, right retrograde ureteropyelogram, fluoroscopic interpretation, right ureteral stone extraction, placement of 6 French by 24 cm contour double-J stent without tether  Surgeon: Avianna Moynahan  Anesthesia: General with LMA  Complications: None  Specimen: Stones  Estimated blood loss: Less than 10 mL  Indications: 57 year old male with symptomatic right distal ureteral calculi.  He has had intermittent flank pain and nausea.  Evaluation included GI/general surgery consultation as well as urology for his right ureteral calculi.  He presents at this time for management of the stones.  Risks and complications have been discussed with the patient's mother, who is a power of attorney.  She understands the risks involved including anesthetic complications, cardiac complications, infection, bleeding, stent discomfort, among others.  She understands these and desires to proceed.  Findings: Urethra was normal except for mild narrowing of the proximal urethra.  This did not prevent scope passage.  Prostate was nonobstructive.  Bladder revealed normal urothelium except for some inflammatory process around the right ureteral orifice, most likely secondary to the nearby calculi.  No other urothelial lesions were noted.  There were no bladder calculi.  Ureteral orifices were normal in configuration location.  Retrograde study of the right ureter with Omnipaque revealed 2 filling defects in the right distal ureter consistent with the above-mentioned calculi.  There was proximal hydroureteronephrosis with pyelocaliectasis.  No other filling defects were noted throughout the right side.  Description of procedure: The patient was properly identified and marked in the holding area.  He received preoperative IV antibiotics.  Was taken to the operating room where general anesthetic was administered with the  LMA.  He was placed in the dorsolithotomy position.  Genitalia and perineum were prepped and draped.  Proper timeout was performed.  Urethra was dilated to 24 [place] with [employer] sounds.  The 81 French scope was passed without difficulty into the bladder.  The right distal ureter was somewhat difficult to cannulate, but with the assistance of an open-ended catheter, a sensor tip catheter was advanced into the distal ureter.  The guidewire was removed after the open-ended catheter was advanced.  Retrograde study was performed.  Following this, sensor tip guidewire was advanced into the upper pole calyceal system, the cystoscope and the open-ended catheter were removed.  The distal ureteral orifice was dilated first with the obturator, then the entire 12/14 ureteral access catheter.  Guidewire was left in place.  Semirigid scope was advanced under direct vision up into the left ureter where the stones were identified.  Since the orifice had been dilated, I used the 0 tip nitinol basket to grasp what I thought was a more distal stone.  This was easily removed from the distal ureter.  It turns out that both stones were engaged, and were removed together.  I then passed the scope into the ureter, and inspected the entire ureter up to the UPJ.  No further stones were seen.  At this point the ureteroscope was removed, the guidewire backloaded through the cystoscope, and a 24 cm x 6 [place] contour double-J stent was deployed in the right ureter, with excellent proximal and distal curl seen with fluoroscopy and cystoscopy, respectively.  The bladder was drained.  The scope was then removed.  The patient was then awakened.  He was taken to the PACU in stable condition.  He tolerated procedure well.

## 2017-11-14 NOTE — Anesthesia Procedure Notes (Signed)
Procedure Name: LMA Insertion Date/Time: 11/14/2017 10:56 AM Performed by: Mitzie Na, CRNA Pre-anesthesia Checklist: Patient identified, Emergency Drugs available, Suction available, Patient being monitored and Timeout performed Patient Re-evaluated:Patient Re-evaluated prior to induction Oxygen Delivery Method: Circle system utilized Preoxygenation: Pre-oxygenation with 100% oxygen Induction Type: IV induction Number of attempts: 1 Placement Confirmation: positive ETCO2 Dental Injury: Teeth and Oropharynx as per pre-operative assessment

## 2017-11-14 NOTE — Interval H&P Note (Signed)
History and Physical Interval Note:  11/14/2017 10:25 AM  Tony Jones  has presented today for surgery, with the diagnosis of RIGHT URETERAL STONES  The various methods of treatment have been discussed with the patient and family. After consideration of risks, benefits and other options for treatment, the patient has consented to  Procedure(s) with comments: CYSTOSCOPY/URETEROSCOPY/HOLMIUM LASER/STENT PLACEMENT (Right) - 75 MINS  SPINAL OR MAC ANESTHESIA  ALSO EXTRACTION OF STONE HOLMIUM LASER APPLICATION (Right) as a surgical intervention .  The patient's history has been reviewed, patient examined, no change in status, stable for surgery.  I have reviewed the patient's chart and labs.  Questions were answered to the patient's satisfaction.     Lillette Boxer Lark Runk

## 2017-11-14 NOTE — H&P (Signed)
H&P  Chief Complaint: Kidney stone  History of Present Illness: 57 year old male presents for URS mgmt of 2 right distal ureteral calculi. He was seen in the office 5.22.2019 for abdominal pain. He had a CT previous to that appt which showed 2 rt distal ureteral stones. He has had intermittent abd pain which has been evaluated by Gen Surg as well as GI. A large gallstone is not thought to be the causative factor.   He has significant cardiac issues and it has been suggested that he is at anesthetic risk. For this reason we are proceeding under spinal.  Past Medical History:  Diagnosis Date  . Acute kidney injury ([place])   . Anemia   . Anxiety   . Barrett esophagus   . Cholelithiasis   . Chronic respiratory failure with hypoxia ([place])   . Developmental delay   . Diabetes mellitus type 2, uncontrolled ([place])   . Dyslipidemia   . GERD (gastroesophageal reflux disease)   . Hypertension   . Mental disorder   . Nephrolithiasis   . Pulmonary hypertension ([place])   . Right heart failure [facility])     Past Surgical History:  Procedure Laterality Date  . ESOPHAGOGASTRODUODENOSCOPY N/A 08/02/2017   Procedure: ESOPHAGOGASTRODUODENOSCOPY (EGD);  Surgeon: [name], MD;  Location: [facility];  Service: Endoscopy;  Laterality: N/A;  . GIVENS CAPSULE STUDY N/A 10/12/2017   Procedure: GIVENS CAPSULE STUDY;  Surgeon: [name], MD;  Location: [place];  Service: Endoscopy;  Laterality: N/A;  . RIGHT/LEFT HEART CATH AND CORONARY ANGIOGRAPHY N/A 10/05/2017   Procedure: RIGHT/LEFT HEART CATH AND CORONARY ANGIOGRAPHY;  Surgeon: [name], MD;  Location: [place] CV LAB;  Service: Cardiovascular;  Laterality: N/A;  . THROAT SURGERY    . TRANSTHORACIC ECHOCARDIOGRAM  01/2015   Normal LV size.  Mild LVH.  EF 55-60%.  No RWMA.  Questionable diastolic parameters.  Mild RV and RA dilation.    Home Medications:  Allergies as of 11/14/2017      Reactions   Codeine Nausea And Vomiting       Medication List    Notice   Cannot display discharge medications because the patient has not yet been admitted.     Allergies:  Allergies  Allergen Reactions  . Codeine Nausea And Vomiting    Family History  Problem Relation Age of Onset  . Congestive Heart Failure Father   . Heart disease Father   . Heart attack Father   . Atrial fibrillation Father   . Brain cancer Father   . Melanoma Father   . Diabetes Mother   . Stroke Mother   . Hypertension Mother   . Fainting Paternal Grandfather   . Congestive Heart Failure Paternal Grandfather   . Atrial fibrillation Paternal Grandfather     Social History:  reports that he has never smoked. He has never used smokeless tobacco. He reports that he does not drink alcohol or use drugs.  ROS: A complete review of systems was performed.  All systems are negative except for pertinent findings as noted.  Physical Exam:  Vital signs in last 24 hours:   Constitutional:  Alert and oriented, No acute distress Cardiovascular: Regular rate and rhythm, No JVD Respiratory: Normal respiratory effor GI: Abdomen is soft, nontender, nondistended, no abdominal masses Genitourinary: No CVAT. Normal male phallus, testes are descended bilaterally and non-tender and without masses, scrotum is normal in appearance without lesions or masses, perineum is normal on inspection. Lymphatic: No lymphadenopathy Neurologic:  Grossly intact, no focal deficits Psychiatric: Normal mood and affect  Laboratory Data:  No results for input(s): WBC, HGB, HCT, PLT in the last 72 hours.  No results for input(s): NA, K, CL, GLUCOSE, BUN, CALCIUM, CREATININE in the last 72 hours.  Invalid input(s): CO3   No results found for this or any previous visit (from the past 24 hour(s)). No results found for this or any previous visit (from the past 240 hour(s)).  Renal Function: Recent Labs    11/10/17 1319  CREATININE 1.56*   Estimated Creatinine Clearance: 47.5  mL/min (A) (by C-G formula based on SCr of 1.56 mg/dL (H)).  Radiologic Imaging: No results found.  Impression/Assessment:  Right distal ureteral calculi  Plan:  Right ureteroscopy, possible HLL and stone extraction

## 2017-11-14 NOTE — Anesthesia Postprocedure Evaluation (Signed)
Anesthesia Post Note  Patient: Tony Jones  Procedure(s) Performed: CYSTOSCOPY/URETEROSCOPY/RETROGRADE/STENT PLACEMENT (Right )     Patient location during evaluation: PACU Anesthesia Type: General Level of consciousness: sedated Pain management: pain level controlled Vital Signs Assessment: post-procedure vital signs reviewed and stable Respiratory status: spontaneous breathing and respiratory function stable Cardiovascular status: stable Postop Assessment: no apparent nausea or vomiting Anesthetic complications: no    Last Vitals:  Vitals:   11/14/17 1200 11/14/17 1215  BP: 119/76 126/81  Pulse: 97 98  Resp: 15 16  Temp:  36.5 C  SpO2: 91% 92%    Last Pain:  Vitals:   11/14/17 1137  TempSrc:   PainSc: 0-No pain                 Yvetta Drotar DANIEL

## 2017-11-15 ENCOUNTER — Other Ambulatory Visit: Payer: Self-pay

## 2017-11-15 ENCOUNTER — Encounter (HOSPITAL_COMMUNITY): Payer: Self-pay | Admitting: Urology

## 2017-11-15 NOTE — Patient Outreach (Signed)
[facility]) Care Management  11/15/2017  SAFWAN TOMEI October 07, 1960 [id number]  BSW spoke with patient's guardian, Marcel Gary, to ensure receipt of resources mailed.  She confirmed that these resources were received.  BSW reminded her that RNCM, Thea Silversmith, has been attempting to contact her.  BSW sent in-basket message to [place].  BSW did encourage Ms. Hildreth to complete and submit the application for Medicaid as there will be many more in-home aide and adult day care options if he is approved.  BSW is closing case at this time but can reopen if any other social work needs are identified.    Ronn Melena, BSW Social Worker [phone]

## 2017-11-17 DIAGNOSIS — R2232 Localized swelling, mass and lump, left upper limb: Secondary | ICD-10-CM | POA: Diagnosis not present

## 2017-11-18 ENCOUNTER — Telehealth (HOSPITAL_COMMUNITY): Payer: Self-pay | Admitting: *Deleted

## 2017-11-18 ENCOUNTER — Other Ambulatory Visit: Payer: Self-pay | Admitting: Family Medicine

## 2017-11-18 DIAGNOSIS — R2232 Localized swelling, mass and lump, left upper limb: Secondary | ICD-10-CM

## 2017-11-18 NOTE — Telephone Encounter (Signed)
Patient's wife called to report patient has had a 5 lb wt gain since Tuesday.  He had kidney stones removed with stent placed on Monday and all meds were held and fluids given.  Patient restarted on all medications Tuesday morning.  No other symptoms reported.   Since patient is asymptomatic I told her no changes for now but to watch fluid and sodium intake over the weekend and if patient continues to gain weight to call back on Monday.  Most likely wt gain from fluids and holding medications from procedure.  She understands and no further questions.

## 2017-11-21 ENCOUNTER — Other Ambulatory Visit: Payer: Self-pay

## 2017-11-21 NOTE — Patient Outreach (Signed)
[facility]) Care Management  11/21/2017  Tony Jones August 16, 1960 [id number]  57 year old with history of heart failure, pulmonary hypertension, chronic hypoxic respiratory failure, DM, developmental delay, muscular dystophy, Barrett's esophagus. Per chartadmitted 5/31-6/13 with: heart failure; chronic hypoxemic respiratory failure; guiac positive anema.  RNCM called to follow up. No answer. Unable to leave message.   Plan: RNCM will send outreach letter and follow up within 3-4 business days.  Thea Silversmith, RN, MSN, [place] Coordinator Cell: [phone]

## 2017-11-25 ENCOUNTER — Other Ambulatory Visit: Payer: Self-pay

## 2017-11-25 NOTE — Patient Outreach (Signed)
[facility]) Care Management  11/25/2017  Tony Jones 10-18-1960 [id number]   57 year old with history of heart failure, pulmonary hypertension, chronic hypoxic respiratory failure, DM, developmental delay, muscular dystophy, Barrett's esophagus. Per chartadmitted 5/31-6/13 with: heart failure; chronic hypoxemic respiratory failure; guiac positive anema.  RNCM called to follow up. RNCM called both listed contact numbers. No answer. Unable to leave message.   Plan: outreach letter sent 11/21/17. This is the second outreach attempt since outreach letter sent. RNCM will call within 3-4 days.  Thea Silversmith, RN, MSN, [place] Coordinator Cell: [phone]

## 2017-11-29 ENCOUNTER — Other Ambulatory Visit: Payer: PPO

## 2017-11-30 DIAGNOSIS — N200 Calculus of kidney: Secondary | ICD-10-CM | POA: Diagnosis not present

## 2017-12-01 ENCOUNTER — Encounter: Payer: PPO | Admitting: Cardiology

## 2017-12-04 NOTE — Progress Notes (Signed)
This encounter was created in error - please disregard.

## 2017-12-09 ENCOUNTER — Encounter (HOSPITAL_COMMUNITY): Payer: Self-pay

## 2017-12-09 ENCOUNTER — Telehealth (HOSPITAL_COMMUNITY): Payer: Self-pay

## 2017-12-09 ENCOUNTER — Other Ambulatory Visit (HOSPITAL_COMMUNITY): Payer: Self-pay | Admitting: Internal Medicine

## 2017-12-09 ENCOUNTER — Ambulatory Visit (HOSPITAL_COMMUNITY)
Admission: RE | Admit: 2017-12-09 | Discharge: 2017-12-09 | Disposition: A | Discharge status: Home / Self Care | Payer: No Typology Code available for payment source | Source: Ambulatory Visit | Attending: Internal Medicine | Admitting: Internal Medicine

## 2017-12-09 ENCOUNTER — Other Ambulatory Visit: Payer: Self-pay

## 2017-12-09 DIAGNOSIS — K8020 Calculus of gallbladder without cholecystitis without obstruction: Secondary | ICD-10-CM

## 2017-12-09 DIAGNOSIS — N200 Calculus of kidney: Secondary | ICD-10-CM

## 2017-12-09 DIAGNOSIS — R109 Unspecified abdominal pain: Secondary | ICD-10-CM

## 2017-12-09 NOTE — Patient Outreach (Signed)
[facility]) Care Management  12/09/2017  Tony Jones 08-09-1960 [id number]  57 year old with history of heart failure, pulmonary hypertension, chronic hypoxic respiratory failure, DM, developmental delay, muscular dystophy, Barrett's esophagus. Per chartadmitted 5/31-6/13 with: heart failure; chronic hypoxemic respiratory failure; guiac positive anema  RNCM called to follow up. No answer.  RNCM has been unsuccessful in attempts to contact client/caregiver. Outreach letter sent 11/21/17, RNCM called x3 with no return response.  Plan: close case.  Thea Silversmith, RN, MSN, [place] Coordinator Cell: [phone]

## 2017-12-09 NOTE — Telephone Encounter (Signed)
Dr. Jimmye Norman called to inform us that pt's mother was not giving pt his sidenafil. Took it off his med list.

## 2017-12-12 ENCOUNTER — Ambulatory Visit (HOSPITAL_COMMUNITY)
Admission: RE | Admit: 2017-12-12 | Discharge: 2017-12-12 | Disposition: A | Discharge status: Home / Self Care | Payer: No Typology Code available for payment source | Source: Ambulatory Visit | Attending: Internal Medicine | Admitting: Internal Medicine

## 2017-12-12 DIAGNOSIS — N281 Cyst of kidney, acquired: Secondary | ICD-10-CM | POA: Diagnosis not present

## 2017-12-12 DIAGNOSIS — R109 Unspecified abdominal pain: Secondary | ICD-10-CM | POA: Diagnosis present

## 2017-12-12 DIAGNOSIS — K8020 Calculus of gallbladder without cholecystitis without obstruction: Secondary | ICD-10-CM | POA: Diagnosis not present

## 2017-12-21 ENCOUNTER — Emergency Department (HOSPITAL_COMMUNITY): Payer: No Typology Code available for payment source

## 2017-12-21 ENCOUNTER — Encounter (HOSPITAL_COMMUNITY): Payer: Self-pay | Admitting: *Deleted

## 2017-12-21 ENCOUNTER — Inpatient Hospital Stay (HOSPITAL_COMMUNITY)
Admission: EM | Admit: 2017-12-21 | Discharge: 2017-12-23 | DRG: 445 | Disposition: A | Discharge status: Home / Self Care | Payer: No Typology Code available for payment source | Attending: Internal Medicine | Admitting: Internal Medicine

## 2017-12-21 ENCOUNTER — Observation Stay (HOSPITAL_COMMUNITY): Payer: No Typology Code available for payment source

## 2017-12-21 DIAGNOSIS — Z6832 Body mass index (BMI) 32.0-32.9, adult: Secondary | ICD-10-CM

## 2017-12-21 DIAGNOSIS — F419 Anxiety disorder, unspecified: Secondary | ICD-10-CM | POA: Diagnosis present

## 2017-12-21 DIAGNOSIS — R1010 Upper abdominal pain, unspecified: Secondary | ICD-10-CM

## 2017-12-21 DIAGNOSIS — Z66 Do not resuscitate: Secondary | ICD-10-CM | POA: Diagnosis present

## 2017-12-21 DIAGNOSIS — I2721 Secondary pulmonary arterial hypertension: Secondary | ICD-10-CM | POA: Diagnosis present

## 2017-12-21 DIAGNOSIS — K2270 Barrett's esophagus without dysplasia: Secondary | ICD-10-CM | POA: Diagnosis present

## 2017-12-21 DIAGNOSIS — I130 Hypertensive heart and chronic kidney disease with heart failure and stage 1 through stage 4 chronic kidney disease, or unspecified chronic kidney disease: Secondary | ICD-10-CM | POA: Diagnosis present

## 2017-12-21 DIAGNOSIS — Z87442 Personal history of urinary calculi: Secondary | ICD-10-CM

## 2017-12-21 DIAGNOSIS — I2781 Cor pulmonale (chronic): Secondary | ICD-10-CM | POA: Diagnosis present

## 2017-12-21 DIAGNOSIS — N400 Enlarged prostate without lower urinary tract symptoms: Secondary | ICD-10-CM | POA: Diagnosis present

## 2017-12-21 DIAGNOSIS — J9611 Chronic respiratory failure with hypoxia: Secondary | ICD-10-CM | POA: Diagnosis present

## 2017-12-21 DIAGNOSIS — N289 Disorder of kidney and ureter, unspecified: Secondary | ICD-10-CM | POA: Diagnosis not present

## 2017-12-21 DIAGNOSIS — E1165 Type 2 diabetes mellitus with hyperglycemia: Secondary | ICD-10-CM | POA: Diagnosis present

## 2017-12-21 DIAGNOSIS — F329 Major depressive disorder, single episode, unspecified: Secondary | ICD-10-CM | POA: Diagnosis present

## 2017-12-21 DIAGNOSIS — R6250 Unspecified lack of expected normal physiological development in childhood: Secondary | ICD-10-CM | POA: Diagnosis present

## 2017-12-21 DIAGNOSIS — Z833 Family history of diabetes mellitus: Secondary | ICD-10-CM

## 2017-12-21 DIAGNOSIS — N183 Chronic kidney disease, stage 3 (moderate): Secondary | ICD-10-CM | POA: Diagnosis present

## 2017-12-21 DIAGNOSIS — K8020 Calculus of gallbladder without cholecystitis without obstruction: Secondary | ICD-10-CM | POA: Diagnosis not present

## 2017-12-21 DIAGNOSIS — I5032 Chronic diastolic (congestive) heart failure: Secondary | ICD-10-CM | POA: Diagnosis present

## 2017-12-21 DIAGNOSIS — I5082 Biventricular heart failure: Secondary | ICD-10-CM | POA: Diagnosis present

## 2017-12-21 DIAGNOSIS — R1011 Right upper quadrant pain: Secondary | ICD-10-CM | POA: Diagnosis present

## 2017-12-21 DIAGNOSIS — Z8249 Family history of ischemic heart disease and other diseases of the circulatory system: Secondary | ICD-10-CM

## 2017-12-21 DIAGNOSIS — IMO0002 Reserved for concepts with insufficient information to code with codable children: Secondary | ICD-10-CM | POA: Diagnosis present

## 2017-12-21 DIAGNOSIS — R32 Unspecified urinary incontinence: Secondary | ICD-10-CM | POA: Diagnosis present

## 2017-12-21 DIAGNOSIS — E662 Morbid (severe) obesity with alveolar hypoventilation: Secondary | ICD-10-CM | POA: Diagnosis present

## 2017-12-21 DIAGNOSIS — Z79899 Other long term (current) drug therapy: Secondary | ICD-10-CM

## 2017-12-21 DIAGNOSIS — Z7984 Long term (current) use of oral hypoglycemic drugs: Secondary | ICD-10-CM

## 2017-12-21 DIAGNOSIS — I2510 Atherosclerotic heart disease of native coronary artery without angina pectoris: Secondary | ICD-10-CM | POA: Diagnosis present

## 2017-12-21 DIAGNOSIS — Z885 Allergy status to narcotic agent status: Secondary | ICD-10-CM

## 2017-12-21 DIAGNOSIS — Z01811 Encounter for preprocedural respiratory examination: Secondary | ICD-10-CM

## 2017-12-21 DIAGNOSIS — I272 Pulmonary hypertension, unspecified: Secondary | ICD-10-CM | POA: Diagnosis not present

## 2017-12-21 DIAGNOSIS — E785 Hyperlipidemia, unspecified: Secondary | ICD-10-CM | POA: Diagnosis present

## 2017-12-21 DIAGNOSIS — Z9981 Dependence on supplemental oxygen: Secondary | ICD-10-CM

## 2017-12-21 DIAGNOSIS — E1122 Type 2 diabetes mellitus with diabetic chronic kidney disease: Secondary | ICD-10-CM | POA: Diagnosis present

## 2017-12-21 DIAGNOSIS — K219 Gastro-esophageal reflux disease without esophagitis: Secondary | ICD-10-CM | POA: Diagnosis present

## 2017-12-21 LAB — COMPREHENSIVE METABOLIC PANEL
ALK PHOS: 94 U/L (ref 38–126)
ALT: 17 U/L (ref 0–44)
ANION GAP: 10 (ref 5–15)
AST: 23 U/L (ref 15–41)
Albumin: 4 g/dL (ref 3.5–5.0)
BILIRUBIN TOTAL: 0.6 mg/dL (ref 0.3–1.2)
BUN: 33 mg/dL — ABNORMAL HIGH (ref 6–20)
CALCIUM: 9.7 mg/dL (ref 8.9–10.3)
CO2: 33 mmol/L — AB (ref 22–32)
Chloride: 97 mmol/L — ABNORMAL LOW (ref 98–111)
Creatinine, Ser: 1.7 mg/dL — ABNORMAL HIGH (ref 0.61–1.24)
GFR calc non Af Amer: 43 mL/min — ABNORMAL LOW (ref >60)
GFR, EST AFRICAN AMERICAN: 50 mL/min — AB (ref >60)
Glucose, Bld: 62 mg/dL — ABNORMAL LOW (ref 70–99)
Potassium: 3.8 mmol/L (ref 3.5–5.1)
SODIUM: 140 mmol/L (ref 135–145)
TOTAL PROTEIN: 7.7 g/dL (ref 6.5–8.1)

## 2017-12-21 LAB — CBC
HCT: 42.3 % (ref 39.0–52.0)
HEMOGLOBIN: 13.9 g/dL (ref 13.0–17.0)
MCH: 29.7 pg (ref 26.0–34.0)
MCHC: 32.9 g/dL (ref 30.0–36.0)
MCV: 90.4 fL (ref 78.0–100.0)
Platelets: 147 10*3/uL — ABNORMAL LOW (ref 150–400)
RBC: 4.68 MIL/uL (ref 4.22–5.81)
RDW: 16.4 % — ABNORMAL HIGH (ref 11.5–15.5)
WBC: 8.9 10*3/uL (ref 4.0–10.5)

## 2017-12-21 LAB — CBG MONITORING, ED
GLUCOSE-CAPILLARY: 71 mg/dL (ref 70–99)
Glucose-Capillary: 117 mg/dL — ABNORMAL HIGH (ref 70–99)

## 2017-12-21 LAB — GLUCOSE, CAPILLARY: Glucose-Capillary: 85 mg/dL (ref 70–99)

## 2017-12-21 LAB — LIPASE, BLOOD: Lipase: 71 U/L — ABNORMAL HIGH (ref 11–51)

## 2017-12-21 MED ORDER — ACETAMINOPHEN 650 MG RE SUPP: 650.0000 mg | Freq: Four times a day (QID) | RECTAL | Status: DC | PRN

## 2017-12-21 MED ORDER — TORSEMIDE 20 MG PO TABS
40.0000 mg | ORAL_TABLET | Freq: Two times a day (BID) | ORAL | Status: DC
  Administered 2017-12-22 – 2017-12-23 (×3): 40 mg via ORAL
  Filled 2017-12-21 (×3): qty 2

## 2017-12-21 MED ORDER — TIZANIDINE HCL 2 MG PO TABS: 1.0000 mg | ORAL_TABLET | Freq: Four times a day (QID) | ORAL | Status: DC | PRN

## 2017-12-21 MED ORDER — ATORVASTATIN CALCIUM 40 MG PO TABS
40.0000 mg | ORAL_TABLET | Freq: Every day | ORAL | Status: DC
  Administered 2017-12-22: 40 mg via ORAL
  Filled 2017-12-21: qty 1

## 2017-12-21 MED ORDER — ACETAMINOPHEN 325 MG PO TABS: 650.0000 mg | ORAL_TABLET | Freq: Four times a day (QID) | ORAL | Status: DC | PRN

## 2017-12-21 MED ORDER — LORAZEPAM 0.5 MG PO TABS: 0.5000 mg | ORAL_TABLET | Freq: Two times a day (BID) | ORAL | Status: DC | PRN

## 2017-12-21 MED ORDER — MORPHINE SULFATE (PF) 4 MG/ML IV SOLN: 4.0000 mg | Freq: Once | INTRAVENOUS | Status: DC | PRN

## 2017-12-21 MED ORDER — LORATADINE 10 MG PO TABS
10.0000 mg | ORAL_TABLET | Freq: Every day | ORAL | Status: DC
  Administered 2017-12-21 – 2017-12-22 (×2): 10 mg via ORAL
  Filled 2017-12-21 (×2): qty 1

## 2017-12-21 MED ORDER — MIDODRINE HCL 5 MG PO TABS
10.0000 mg | ORAL_TABLET | Freq: Three times a day (TID) | ORAL | Status: DC
  Administered 2017-12-22 – 2017-12-23 (×4): 10 mg via ORAL
  Filled 2017-12-21 (×5): qty 2

## 2017-12-21 MED ORDER — ONDANSETRON HCL 4 MG/2ML IJ SOLN
4.0000 mg | Freq: Once | INTRAMUSCULAR | Status: AC
  Administered 2017-12-21: 4 mg via INTRAVENOUS
  Filled 2017-12-21: qty 2

## 2017-12-21 MED ORDER — FINASTERIDE 5 MG PO TABS: 5.0000 mg | ORAL_TABLET | Freq: Every day | ORAL | Status: DC

## 2017-12-21 MED ORDER — DEXTROSE-NACL 5-0.45 % IV SOLN
INTRAVENOUS | Status: AC
  Administered 2017-12-21: 23:00:00 via INTRAVENOUS

## 2017-12-21 MED ORDER — MORPHINE SULFATE (PF) 4 MG/ML IV SOLN
4.0000 mg | Freq: Once | INTRAVENOUS | Status: AC
  Administered 2017-12-21: 4 mg via INTRAVENOUS
  Filled 2017-12-21: qty 1

## 2017-12-21 MED ORDER — MECLIZINE HCL 25 MG PO TABS
25.0000 mg | ORAL_TABLET | Freq: Three times a day (TID) | ORAL | Status: DC | PRN
  Filled 2017-12-21: qty 1

## 2017-12-21 MED ORDER — SERTRALINE HCL 100 MG PO TABS
100.0000 mg | ORAL_TABLET | Freq: Every day | ORAL | Status: DC
  Administered 2017-12-22 – 2017-12-23 (×2): 100 mg via ORAL
  Filled 2017-12-21 (×3): qty 1

## 2017-12-21 MED ORDER — MORPHINE SULFATE (PF) 2 MG/ML IV SOLN: 0.5000 mg | INTRAVENOUS | Status: DC | PRN

## 2017-12-21 MED ORDER — ALLOPURINOL 300 MG PO TABS
300.0000 mg | ORAL_TABLET | Freq: Every day | ORAL | Status: DC
  Administered 2017-12-22 – 2017-12-23 (×2): 300 mg via ORAL
  Filled 2017-12-21 (×2): qty 1

## 2017-12-21 MED ORDER — DOCUSATE SODIUM 100 MG PO CAPS: 100.0000 mg | ORAL_CAPSULE | Freq: Every day | ORAL | Status: DC | PRN

## 2017-12-21 MED ORDER — PANTOPRAZOLE SODIUM 40 MG PO TBEC
40.0000 mg | DELAYED_RELEASE_TABLET | Freq: Two times a day (BID) | ORAL | Status: DC
  Administered 2017-12-22 – 2017-12-23 (×3): 40 mg via ORAL
  Filled 2017-12-21 (×3): qty 1

## 2017-12-21 MED ORDER — INSULIN ASPART 100 UNIT/ML ~~LOC~~ SOLN: 0.0000 [IU] | SUBCUTANEOUS | Status: DC

## 2017-12-21 MED ORDER — FESOTERODINE FUMARATE ER 8 MG PO TB24: 8.0000 mg | ORAL_TABLET | Freq: Every day | ORAL | Status: DC

## 2017-12-21 MED ORDER — POLYVINYL ALCOHOL 1.4 % OP SOLN: 1.0000 [drp] | Freq: Two times a day (BID) | OPHTHALMIC | Status: DC | PRN

## 2017-12-21 MED ORDER — POTASSIUM CHLORIDE CRYS ER 20 MEQ PO TBCR
20.0000 meq | EXTENDED_RELEASE_TABLET | Freq: Every day | ORAL | Status: DC
  Administered 2017-12-21 – 2017-12-23 (×3): 20 meq via ORAL
  Filled 2017-12-21 (×3): qty 1

## 2017-12-21 MED ORDER — FENTANYL CITRATE (PF) 100 MCG/2ML IJ SOLN
50.0000 ug | Freq: Once | INTRAMUSCULAR | Status: AC
  Administered 2017-12-21: 50 ug via INTRAVENOUS
  Filled 2017-12-21: qty 2

## 2017-12-21 MED ORDER — PANTOPRAZOLE SODIUM 40 MG PO TBEC: 40.0000 mg | DELAYED_RELEASE_TABLET | Freq: Every day | ORAL | Status: DC

## 2017-12-21 MED ORDER — ADULT MULTIVITAMIN W/MINERALS CH
1.0000 | ORAL_TABLET | Freq: Every day | ORAL | Status: DC
  Administered 2017-12-22: 1 via ORAL
  Filled 2017-12-21 (×2): qty 1

## 2017-12-21 MED ORDER — ONDANSETRON HCL 4 MG/2ML IJ SOLN: 4.0000 mg | Freq: Four times a day (QID) | INTRAMUSCULAR | Status: DC | PRN

## 2017-12-21 MED ORDER — LEVOCETIRIZINE DIHYDROCHLORIDE 5 MG PO TABS: 5.0000 mg | ORAL_TABLET | Freq: Every evening | ORAL | Status: DC

## 2017-12-21 NOTE — ED Notes (Signed)
Pt in room and alert, with family @ bedside VS documented Call light within reach

## 2017-12-21 NOTE — ED Notes (Signed)
Paged cards/CHAKRAVARTTI to [facility]

## 2017-12-21 NOTE — ED Notes (Signed)
No answer in the lobby x 3

## 2017-12-21 NOTE — Consult Note (Signed)
Reason for Consult: Gallstones Referring Physician: Ardith Dark md  Tony Jones is an 57 y.o. male.  HPI: Asked to see patient at the request of [name] for right upper quadrant pain.  The patient is a known history of gallstones.  He was admitted in June of this year but surgery was deferred due to cardiac issues.  He has a complex cardiac history and suffers from developmental delay.  He complained of right upper quadrant pain this morning after eating his cereal.  The pain is now resolved but he is having more attacks of right upper quadrant pain after eating.  Patient currently denies pain.  Past Medical History:  Diagnosis Date  . Acute kidney injury ([name])   . Anemia   . Anxiety   . Barrett esophagus   . Cholelithiasis   . Chronic respiratory failure with hypoxia ([name])   . Developmental delay   . Diabetes mellitus type 2, uncontrolled ([name])   . Dyslipidemia   . GERD (gastroesophageal reflux disease)   . Hypertension   . Mental disorder   . Nephrolithiasis   . Pulmonary hypertension ([place])   . Right heart failure [facility])     Past Surgical History:  Procedure Laterality Date  . CYSTOSCOPY/URETEROSCOPY/HOLMIUM LASER/STENT PLACEMENT Right 11/14/2017   Procedure: CYSTOSCOPY/URETEROSCOPY/RETROGRADE/STENT PLACEMENT;  Surgeon: [name], MD;  Location: [facility];  Service: Urology;  Laterality: Right;  75 MINS  SPINAL OR MAC ANESTHESIA  ALSO EXTRACTION OF STONE  . ESOPHAGOGASTRODUODENOSCOPY N/A 08/02/2017   Procedure: ESOPHAGOGASTRODUODENOSCOPY (EGD);  Surgeon: [name], MD;  [facility];  Service: Endoscopy;  Laterality: N/A;  . GIVENS CAPSULE STUDY N/A 10/12/2017   Procedure: GIVENS CAPSULE STUDY;  Surgeon: [name], MD;  Location: [place];  Service: Endoscopy;  Laterality: N/A;  . RIGHT/LEFT HEART CATH AND CORONARY ANGIOGRAPHY N/A 10/05/2017   Procedure: RIGHT/LEFT HEART CATH AND CORONARY ANGIOGRAPHY;  Surgeon: [name], MD;  Location: [place] CV  LAB;  Service: Cardiovascular;  Laterality: N/A;  . THROAT SURGERY    . TRANSTHORACIC ECHOCARDIOGRAM  01/2015   Normal LV size.  Mild LVH.  EF 55-60%.  No RWMA.  Questionable diastolic parameters.  Mild RV and RA dilation.    Family History  Problem Relation Age of Onset  . Congestive Heart Failure Father   . Heart disease Father   . Heart attack Father   . Atrial fibrillation Father   . Brain cancer Father   . Melanoma Father   . Diabetes Mother   . Stroke Mother   . Hypertension Mother   . Fainting Paternal Grandfather   . Congestive Heart Failure Paternal Grandfather   . Atrial fibrillation Paternal Grandfather     Social History:  reports that he has never smoked. He has never used smokeless tobacco. He reports that he does not drink alcohol or use drugs.  Allergies:  Allergies  Allergen Reactions  . Codeine Nausea And Vomiting    Medications: I have reviewed the patient's current medications.  Results for orders placed or performed during the hospital encounter of 12/21/17 (from the past 48 hour(s))  Lipase, blood     Status: Abnormal   Collection Time: 12/21/17 12:30 PM  Result Value Ref Range   Lipase 71 (H) 11 - 51 U/L    Comment: Performed at [facility] [address] [place], [place] [address]  Comprehensive metabolic panel     Status: Abnormal   Collection Time: 12/21/17 12:30 PM  Result Value Ref  Range   Sodium 140 135 - 145 mmol/L   Potassium 3.8 3.5 - 5.1 mmol/L   Chloride 97 (L) 98 - 111 mmol/L   CO2 33 (H) 22 - 32 mmol/L   Glucose, Bld 62 (L) 70 - 99 mg/dL   BUN 33 (H) 6 - 20 mg/dL   Creatinine, Ser 1.70 (H) 0.61 - 1.24 mg/dL   Calcium 9.7 8.9 - 10.3 mg/dL   Total Protein 7.7 6.5 - 8.1 g/dL   Albumin 4.0 3.5 - 5.0 g/dL   AST 23 15 - 41 U/L   ALT 17 0 - 44 U/L   Alkaline Phosphatase 94 38 - 126 U/L   Total Bilirubin 0.6 0.3 - 1.2 mg/dL   GFR calc non Af Amer 43 (L) >60 mL/min   GFR calc Af Amer 50 (L) >60 mL/min    Comment: (NOTE) The  eGFR has been calculated using the CKD EPI equation. This calculation has not been validated in all clinical situations. eGFR's persistently <60 mL/min signify possible Chronic Kidney Disease.    Anion gap 10 5 - 15    Comment: Performed at [facility] [address] [place], [place] [address]  CBC     Status: Abnormal   Collection Time: 12/21/17 12:30 PM  Result Value Ref Range   WBC 8.9 4.0 - 10.5 K/uL   RBC 4.68 4.22 - 5.81 MIL/uL   Hemoglobin 13.9 13.0 - 17.0 g/dL   HCT 42.3 39.0 - 52.0 %   MCV 90.4 78.0 - 100.0 fL   MCH 29.7 26.0 - 34.0 pg   MCHC 32.9 30.0 - 36.0 g/dL   RDW 16.4 (H) 11.5 - 15.5 %   Platelets 147 (L) 150 - 400 K/uL    Comment: Performed at [facility] [address] [place], [place] [address]  CBG monitoring, ED     Status: None   Collection Time: 12/21/17  4:16 PM  Result Value Ref Range   Glucose-Capillary 71 70 - 99 mg/dL    US Abdomen Limited Ruq  Result Date: 12/21/2017 CLINICAL DATA:  Upper abdominal pain. EXAM: ULTRASOUND ABDOMEN LIMITED RIGHT UPPER QUADRANT COMPARISON:  Right upper quadrant ultrasound 10/02/2017 FINDINGS: Gallbladder: A 1.6 cm shadowing stone is present at the neck of the gallbladder. Gallbladder wall thickness is upper limits of normal at 2.8 mm. No sonographic Percell Miller sign is reported. Common bile duct: Diameter: 4.1 mm, within normal limits Liver: No focal lesion identified. Within normal limits in parenchymal echogenicity. Portal vein is patent on color Doppler imaging with normal direction of blood flow towards the liver. IMPRESSION: Cholelithiasis without evidence for cholecystitis. Stable appearance of stone at the neck of the gallbladder. Electronically Signed   By: [name] M.D.   On: 12/21/2017 15:48    Review of Systems  Constitutional: Negative for chills and fever.  HENT: Negative.   Eyes: Negative.   Respiratory: Negative.   Cardiovascular: Negative.   Gastrointestinal: Positive for abdominal  pain. Negative for nausea and vomiting.  Genitourinary: Negative.   Musculoskeletal: Negative.   Skin: Negative.   Neurological: Negative.   Endo/Heme/Allergies: Negative.   Psychiatric/Behavioral: Negative.    Blood pressure 120/81, pulse 86, temperature 98.9 F (37.2 C), temperature source Oral, resp. rate 16, SpO2 99 %. Physical Exam  Constitutional: He is oriented to person, place, and time. He appears well-developed and well-nourished.  HENT:  Head: Normocephalic and atraumatic.  Eyes: Pupils are equal, round, and reactive to light.  Cardiovascular: Normal  rate and regular rhythm.  Respiratory: Effort normal and breath sounds normal.  GI: Soft. Bowel sounds are normal. He exhibits no distension. There is no tenderness.  Musculoskeletal: Normal range of motion.  Neurological: He is alert and oriented to person, place, and time. GCS eye subscore is 4. GCS verbal subscore is 5. GCS motor subscore is 6.  Psychiatric: Judgment and thought content normal. His speech is delayed. He is slowed. Cognition and memory are impaired.    Assessment/Plan: Symptomatic cholelithiasis  Patient requires medical clearance.  Recommend cholecystectomy during this admission since this is a recurrent attack and he is a complex patient making outpatient surgery more complicated.  Okay to have clear liquids tonight n.p.o. after midnight.  Surgery tomorrow or Friday depending on medical clearance and cardiac clearance.  Saryah Loper A Allana Shrestha 12/21/2017, 6:54 PM

## 2017-12-21 NOTE — ED Notes (Signed)
CBG= 121

## 2017-12-21 NOTE — ED Triage Notes (Signed)
Pt in c/o possible gallbladder issue, c/o abdominal pain and n/v, history of gallstones and this feels the same

## 2017-12-21 NOTE — ED Provider Notes (Signed)
[facility] EMERGENCY DEPARTMENT Provider Note   CSN: [id number] Arrival date & time: 12/21/17  1202     History   Chief Complaint Chief Complaint  Patient presents with  . Abdominal Pain    HPI Tony Jones is a 57 y.o. male with a history of developmental delays, anxiety, anemia, cholelithiasis, chronic respiratory failure on home oxygen (6L), diabetes, hypertension, hyperlipidemia, GERD, pulmonary hypertension, and right sided HF who presents emergency department today for right upper abdominal pain.  Patient was reportedly eating cereal this morning and shortly after started having right upper abdominal pain that was similar to previous biliary colic attacks.  Mother reports that he had one episode of nonbilious, nonbloody emesis.  He was seen by his PCP who recommended ER evaluation.  They contacted the [facility] who recommended ER work-up for possible admission depending on lab work.  Patient has reported been having increasing attacks over the last several weeks.  They report a similar attack awaken him from sleep on 8/18 that resolved after several hours.  Patient denies any fever, chest pain, shortness of breath, cough, lower abdominal pain, urinary symptoms, diarrhea.  He has been n.p.o. since this morning after breakfast.  He reports that his pain has improved.  No interventions prior to arrival.  He denies any nausea.  Mother states he has not had any emesis since one bout this morning.  HPI  Past Medical History:  Diagnosis Date  . Acute kidney injury ([place])   . Anemia   . Anxiety   . Barrett esophagus   . Cholelithiasis   . Chronic respiratory failure with hypoxia ([place])   . Developmental delay   . Diabetes mellitus type 2, uncontrolled ([place])   . Dyslipidemia   . GERD (gastroesophageal reflux disease)   . Hypertension   . Mental disorder   . Nephrolithiasis   . Pulmonary hypertension ([place])   . Right heart failure [facility])     Patient Active Problem List   Diagnosis Date Noted  . OSA (obstructive sleep apnea)   . RVF (right ventricular failure) ([place])   . Pulmonary hypertension ([place]) 09/30/2017  . Failure to thrive in adult 09/30/2017  . Anemia 09/30/2017  . Right heart failure ([place]) 09/30/2017  . Postural dizziness 08/29/2017  . Hematemesis 07/24/2017  . Moderate obesity 09/17/2015  . Thrombocytopenia ([place]) 02/02/2015  . Chronic diastolic heart failure ([place]) 01/29/2015  . Diabetes mellitus type 2, controlled ([place]) 01/29/2015  . Mental disorder   . Diabetes mellitus type 2, uncontrolled ([place])   . Hypertension   . Dyslipidemia     Past Surgical History:  Procedure Laterality Date  . CYSTOSCOPY/URETEROSCOPY/HOLMIUM LASER/STENT PLACEMENT Right 11/14/2017   Procedure: CYSTOSCOPY/URETEROSCOPY/RETROGRADE/STENT PLACEMENT;  Surgeon: [name], MD;  Location: [facility];  Service: Urology;  Laterality: Right;  75 MINS  SPINAL OR MAC ANESTHESIA  ALSO EXTRACTION OF STONE  . ESOPHAGOGASTRODUODENOSCOPY N/A 08/02/2017   Procedure: ESOPHAGOGASTRODUODENOSCOPY (EGD);  Surgeon: [name], MD;  Location: [facility];  Service: Endoscopy;  Laterality: N/A;  . GIVENS CAPSULE STUDY N/A 10/12/2017   Procedure: GIVENS CAPSULE STUDY;  Surgeon: [name], MD;  Location: [facility];  Service: Endoscopy;  Laterality: N/A;  . RIGHT/LEFT HEART CATH AND CORONARY ANGIOGRAPHY N/A 10/05/2017   Procedure: RIGHT/LEFT HEART CATH AND CORONARY ANGIOGRAPHY;  Surgeon: [name], MD;  Location: [facility] CV LAB;  Service: Cardiovascular;  Laterality: N/A;  . THROAT SURGERY    . TRANSTHORACIC ECHOCARDIOGRAM  01/2015   Normal LV size.  Mild LVH.  EF 55-60%.  No RWMA.  Questionable diastolic parameters.  Mild RV and RA dilation.        Home Medications    Prior to Admission medications   Medication Sig Start Date End Date Taking? Authorizing Provider  acetaminophen (TYLENOL) 650 MG CR tablet Take 1,300 mg by mouth daily as needed for pain.    [provider]  allopurinol (ZYLOPRIM) 300 MG tablet Take 300 mg by mouth daily with supper.  01/18/15   [provider]  atorvastatin (LIPITOR) 40 MG tablet Take 1 tablet (40 mg total) by mouth daily at 6 PM. 10/13/17   Tillery, Satira Mccallum, PA-C  diclofenac sodium (VOLTAREN) 1 % GEL Apply 1 application topically 2 (two) times daily as needed (leg pain).    [provider]  docusate sodium (COLACE) 100 MG capsule Take 100 mg by mouth daily as needed for mild constipation.     [provider]  fesoterodine (TOVIAZ) 8 MG TB24 tablet Take 8 mg by mouth daily with supper.     [provider]  finasteride (PROSCAR) 5 MG tablet Take 5 mg by mouth daily.    [provider]  glimepiride (AMARYL) 2 MG tablet Take 2 mg by mouth daily with breakfast.    [provider]  levocetirizine (XYZAL) 5 MG tablet Take 5 mg by mouth daily with breakfast.     [provider]  meclizine (ANTIVERT) 25 MG tablet Take 25 mg by mouth 3 (three) times daily as needed for dizziness.  07/18/17   [provider]  metFORMIN (GLUCOPHAGE) 1000 MG tablet Take 1,000 mg by mouth 2 (two) times daily with a meal. 07/08/17   [provider]  midodrine (PROAMATINE) 10 MG tablet Take 10 mg by mouth 3 (three) times daily with meals.    [provider]  Multiple Vitamin (MULTIVITAMIN WITH MINERALS) TABS tablet Take 1 tablet by mouth daily with breakfast. [place] MV    [provider]  oxybutynin (DITROPAN) 5 MG tablet Take 5 mg by mouth 2 (two) times daily with a meal.     [provider]  pantoprazole (PROTONIX) 40 MG tablet Take 40 mg by mouth 2 (two) times daily with a meal.     [provider]  potassium chloride SA (K-DUR,KLOR-CON) 20 MEQ tablet Take 1 tablet (20 mEq total) by mouth daily. 10/14/17   Shirley Friar, PA-C  Propylene Glycol (SYSTANE BALANCE OP) Place 1 drop into both eyes 2 (two) times daily as  needed (dry eyes).    [provider]  sertraline (ZOLOFT) 100 MG tablet Take 100 mg by mouth daily with breakfast. 05/22/17   [provider]  sulfamethoxazole-trimethoprim (BACTRIM DS,SEPTRA DS) 800-160 MG tablet Take 1 tablet by mouth 2 (two) times daily. 11/14/17   [name], MD  tiZANidine (ZANAFLEX) 2 MG tablet Take 1 mg by mouth every 6 (six) hours as needed (leg cramps).  01/16/16   [provider]  torsemide (DEMADEX) 20 MG tablet Take 4 tablets (80 mg total) by mouth daily. 10/14/17   Shirley Friar, PA-C    Family History Family History  Problem Relation Age of Onset  . Congestive Heart Failure Father   . Heart disease Father   . Heart attack Father   . Atrial fibrillation Father   . Brain cancer Father   . Melanoma Father   . Diabetes Mother   . Stroke Mother   . Hypertension Mother   .  Fainting Paternal Grandfather   . Congestive Heart Failure Paternal Grandfather   . Atrial fibrillation Paternal Grandfather     Social History Social History   Tobacco Use  . Smoking status: Never Smoker  . Smokeless tobacco: Never Used  Substance Use Topics  . Alcohol use: No  . Drug use: No     Allergies   Codeine   Review of Systems Review of Systems  All other systems reviewed and are negative.    Physical Exam Updated Vital Signs BP 111/86   Pulse 95   Temp 98.9 F (37.2 C) (Oral)   Resp 20   SpO2 94%   Physical Exam  Constitutional: He appears well-developed and well-nourished.  HENT:  Head: Normocephalic and atraumatic.  Right Ear: External ear normal.  Left Ear: External ear normal.  Nose: Nose normal.  Mouth/Throat: Uvula is midline, oropharynx is clear and moist and mucous membranes are normal. No tonsillar exudate.  Eyes: Pupils are equal, round, and reactive to light. Right eye exhibits no discharge. Left eye exhibits no discharge. No scleral icterus.  No scleral icterus  Neck: Trachea normal. Neck  supple. No spinous process tenderness present. No neck rigidity. Normal range of motion present.  Cardiovascular: Normal rate, regular rhythm and intact distal pulses.  No murmur heard. Pulses:      Radial pulses are 2+ on the right side, and 2+ on the left side.       Dorsalis pedis pulses are 2+ on the right side, and 2+ on the left side.       Posterior tibial pulses are 2+ on the right side, and 2+ on the left side.  No lower extremity swelling or edema. Calves symmetric in size bilaterally.  Pulmonary/Chest: Effort normal and breath sounds normal. He exhibits no tenderness.  On oxygen. Sating at 97% with good waveform on monitor.  Abdominal: Soft. Bowel sounds are normal. He exhibits no distension. There is tenderness in the right upper quadrant. There is no rigidity, no rebound, no guarding and no CVA tenderness.  Patient winces when pressing on RUQ  Musculoskeletal: He exhibits no edema.  Lymphadenopathy:    He has no cervical adenopathy.  Neurological: He is alert.  Skin: Skin is warm and dry. No rash noted. He is not diaphoretic.  No jaundice   Psychiatric: He has a normal mood and affect.  Nursing note and vitals reviewed.    ED Treatments / Results  Labs (all labs ordered are listed, but only abnormal results are displayed) Labs Reviewed  LIPASE, BLOOD - Abnormal; Notable for the following components:      Result Value   Lipase 71 (*)    All other components within normal limits  COMPREHENSIVE METABOLIC PANEL - Abnormal; Notable for the following components:   Chloride 97 (*)    CO2 33 (*)    Glucose, Bld 62 (*)    BUN 33 (*)    Creatinine, Ser 1.70 (*)    GFR calc non Af Amer 43 (*)    GFR calc Af Amer 50 (*)    All other components within normal limits  CBC - Abnormal; Notable for the following components:   RDW 16.4 (*)    Platelets 147 (*)    All other components within normal limits  URINALYSIS, ROUTINE W REFLEX MICROSCOPIC  CBG MONITORING, ED     EKG None  Radiology US Abdomen Limited Ruq  Result Date: 12/21/2017 CLINICAL DATA:  Upper abdominal pain. EXAM: ULTRASOUND ABDOMEN LIMITED  RIGHT UPPER QUADRANT COMPARISON:  Right upper quadrant ultrasound 10/02/2017 FINDINGS: Gallbladder: A 1.6 cm shadowing stone is present at the neck of the gallbladder. Gallbladder wall thickness is upper limits of normal at 2.8 mm. No sonographic Percell Miller sign is reported. Common bile duct: Diameter: 4.1 mm, within normal limits Liver: No focal lesion identified. Within normal limits in parenchymal echogenicity. Portal vein is patent on color Doppler imaging with normal direction of blood flow towards the liver. IMPRESSION: Cholelithiasis without evidence for cholecystitis. Stable appearance of stone at the neck of the gallbladder. Electronically Signed   By: [name] M.D.   On: 12/21/2017 15:48    Procedures Procedures (including critical care time)  Medications Ordered in ED Medications  fentaNYL (SUBLIMAZE) injection 50 mcg (50 mcg Intravenous Given 12/21/17 1707)  ondansetron (ZOFRAN) injection 4 mg (4 mg Intravenous Given 12/21/17 1707)  morphine 4 MG/ML injection 4 mg (4 mg Intravenous Given 12/21/17 1811)     Initial Impression / Assessment and Plan / ED Course  I have reviewed the triage vital signs and the nursing notes.  Pertinent labs & imaging results that were available during my care of the patient were reviewed by me and considered in my medical decision making (see chart for details).      57 y.o. male with a history of developmental delays, anxiety, anemia, cholelithiasis, chronic respiratory failure on home oxygen (6L), diabetes, hypertension, hyperlipidemia, GERD, pulmonary hypertension, and right sided HF who presents emergency department today for right upper abdominal pain.  Patient has been having increasing right upper quadrant abdominal pain attacks after eating.  He had one this morning with associated vomiting.   Denies any fever, chills with this.  Vital signs are reassuring on presentation.  Lab work and imaging obtained prior to me seeing the patient.  Patient is without leukocytosis.  LFTs are within normal limits.  Lipase mildly elevated at 71.  Creatinine is chronically elevated & near baseline.  Ultrasound shows cholelithiasis without evidence of cholecystitis.  Patient is 1.6 cm stone present at the gallbladder neck.  Gallbladder wall thickness within normal limits.  There is no evidence of pericholecystic fluid.  Common bile duct within normal limits.  Bilirubin within normal limits.  Patient has had increasing number of attacks over the last several weeks. Pain improved in the department. Will consult general surgery. Repeat abdominal exam without peritoneal signs.   6:36 PM discussed case with Dr. Brantley Stage who recommends medical admission and they will follow. Patient will need medical clearance. Family updated on plan and agreement.  Appreciate Dr. Maudie Mercury of [facility] hospitalist service for admitting the patient to the hospital service.  Will consult cardiology discussed case for patient's clearance. Paged.  8:56 PM Repaged cardiology.   9:08 PM Discussed with Dr. [place] Lions, who states heart failure team will need to see them in the morning. Patient put on list.   Final Clinical Impressions(s) / ED Diagnoses   Final diagnoses:  Upper abdominal pain  Calculus of gallbladder without cholecystitis without obstruction    ED Discharge Orders    None       Lorelle Gibbs 12/21/17 2114    Sherwood Gambler, MD 12/21/17 2137

## 2017-12-21 NOTE — H&P (Addendum)
TRH H&P   Patient Demographics:    Tony Jones, is a 57 y.o. male  MRN: [id number]   DOB - 05/21/1960  Admit Date - 12/21/2017  Outpatient Primary MD for the patient is Angelica Pou, MD  Referring MD/NP/PA:   Sherwood Gambler  Outpatient Specialists:   Patient coming from: home  Chief Complaint  Patient presents with  . Abdominal Pain      HPI:    Tony Jones  is a 57 y.o. male, w gerd, barretts esophagus, hypertension, dm2, pulmonary hypertension on 6L o2 [place], presents with c/o RUQ pain and n/v starting earlier today.  Pt denies fever, chills, cp, palp, sob, diarrhea, brbpr, black stool.    In ED,  RUQ ultrasound IMPRESSION: Cholelithiasis without evidence for cholecystitis. Stable appearance of stone at the neck of the gallbladder.  Lipase 71 Na 140, K 3.8, Bun 33, Creatinine 1.7 Ast 23, Alt 17  Wbc 8.9, Hgb 13.9, Plt 147  Pt will be admitted observation for RUQ pain, for possible cholecystectomy    Review of systems:    In addition to the HPI above,  No Fever-chills, No Headache, No changes with Vision or hearing, No problems swallowing food or Liquids, No Chest pain, Cough or Shortness of Breath,  No Blood in stool or Urine, No dysuria, No new skin rashes or bruises, No new joints pains-aches,  No new weakness, tingling, numbness in any extremity, No recent weight gain or loss, No polyuria, polydypsia or polyphagia, No significant Mental Stressors.  A full 10 point Review of Systems was done, except as stated above, all other Review of Systems were negative.   With Past History of the following :    Past Medical History:  Diagnosis Date  . Acute kidney injury ([place])   . Anemia   . Anxiety   . Barrett esophagus   . Cholelithiasis   . Chronic respiratory failure with hypoxia ([place])   . Developmental delay   . Diabetes mellitus type 2,  uncontrolled ([place])   . Dyslipidemia   . GERD (gastroesophageal reflux disease)   . Hypertension   . Mental disorder   . Nephrolithiasis   . Pulmonary hypertension ([place])   . Right heart failure [facility])       Past Surgical History:  Procedure Laterality Date  . CYSTOSCOPY/URETEROSCOPY/HOLMIUM LASER/STENT PLACEMENT Right 11/14/2017   Procedure: CYSTOSCOPY/URETEROSCOPY/RETROGRADE/STENT PLACEMENT;  Surgeon: [name], MD;  Location: [facility];  Service: Urology;  Laterality: Right;  75 MINS  SPINAL OR MAC ANESTHESIA  ALSO EXTRACTION OF STONE  . ESOPHAGOGASTRODUODENOSCOPY N/A 08/02/2017   Procedure: ESOPHAGOGASTRODUODENOSCOPY (EGD);  Surgeon: [name], MD;  Location: [facility];  Service: Endoscopy;  Laterality: N/A;  . GIVENS CAPSULE STUDY N/A 10/12/2017   Procedure: GIVENS CAPSULE STUDY;  Surgeon: [name], MD;  Location: [facility];  Service: Endoscopy;  Laterality: N/A;  . RIGHT/LEFT HEART CATH AND  CORONARY ANGIOGRAPHY N/A 10/05/2017   Procedure: RIGHT/LEFT HEART CATH AND CORONARY ANGIOGRAPHY;  Surgeon: [name], MD;  Location: [place] [facility];  Service: Cardiovascular;  Laterality: N/A;  . THROAT SURGERY    . TRANSTHORACIC ECHOCARDIOGRAM  01/2015   Normal LV size.  Mild LVH.  EF 55-60%.  No RWMA.  Questionable diastolic parameters.  Mild RV and RA dilation.      Social History:     Social History   Tobacco Use  . Smoking status: Never Smoker  . Smokeless tobacco: Never Used  Substance Use Topics  . Alcohol use: No     Lives - at home  Mobility - walks by self   Family History :     Family History  Problem Relation Age of Onset  . Congestive Heart Failure Father   . Heart disease Father   . Heart attack Father   . Atrial fibrillation Father   . Brain cancer Father   . Melanoma Father   . Diabetes Mother   . Stroke Mother   . Hypertension Mother   . Fainting Paternal Grandfather   . Congestive Heart Failure Paternal Grandfather   . Atrial  fibrillation Paternal Grandfather        Home Medications:   Prior to Admission medications   Medication Sig Start Date End Date Taking? Authorizing Provider  acetaminophen (TYLENOL) 650 MG CR tablet Take 1,300 mg by mouth daily as needed for pain.   Yes [provider]  allopurinol (ZYLOPRIM) 300 MG tablet Take 300 mg by mouth daily.  01/18/15  Yes [provider]  LORazepam (ATIVAN) 0.5 MG tablet Take 0.5 mg by mouth 2 (two) times daily as needed for anxiety.   Yes [provider]  triamcinolone cream (KENALOG) 0.1 % Apply 1 application topically See admin instructions. Apply to arms daily   Yes [provider]  atorvastatin (LIPITOR) 40 MG tablet Take 1 tablet (40 mg total) by mouth daily at 6 PM. 10/13/17   Tillery, Satira Mccallum, PA-C  diclofenac sodium (VOLTAREN) 1 % GEL Apply 1 application topically 2 (two) times daily as needed (leg pain).    [provider]  docusate sodium (COLACE) 100 MG capsule Take 100 mg by mouth daily as needed for mild constipation.     [provider]  fesoterodine (TOVIAZ) 8 MG TB24 tablet Take 8 mg by mouth daily with supper.     [provider]  finasteride (PROSCAR) 5 MG tablet Take 5 mg by mouth daily.    [provider]  glimepiride (AMARYL) 2 MG tablet Take 2 mg by mouth daily.     [provider]  levocetirizine (XYZAL) 5 MG tablet Take 5 mg by mouth every evening.     [provider]  meclizine (ANTIVERT) 25 MG tablet Take 25 mg by mouth 3 (three) times daily as needed for dizziness.  07/18/17   [provider]  metFORMIN (GLUCOPHAGE) 1000 MG tablet Take 1,000 mg by mouth 2 (two) times daily with a meal. 07/08/17   [provider]  midodrine (PROAMATINE) 10 MG tablet Take 10 mg by mouth 3 (three) times daily with meals.    [provider]  Multiple Vitamin (MULTIVITAMIN WITH MINERALS) TABS tablet Take 1 tablet by mouth daily with breakfast.  [place] MV    [provider]  oxybutynin (DITROPAN) 5 MG tablet Take 5 mg by mouth 2 (two) times daily with a meal.     [provider]  pantoprazole (PROTONIX) 40 MG tablet Take 40 mg by mouth 2 (two) times daily with a meal.     [provider]  potassium chloride SA (K-DUR,KLOR-CON) 20 MEQ tablet Take 1 tablet (20 mEq total) by mouth daily. 10/14/17   Shirley Friar, PA-C  Propylene Glycol (SYSTANE BALANCE OP) Place 1 drop into both eyes 2 (two) times daily as needed (dry eyes).    [provider]  sertraline (ZOLOFT) 100 MG tablet Take 100 mg by mouth daily with breakfast. 05/22/17   [provider]  sulfamethoxazole-trimethoprim (BACTRIM DS,SEPTRA DS) 800-160 MG tablet Take 1 tablet by mouth 2 (two) times daily. Patient not taking: Reported on 12/21/2017 11/14/17   [name], MD  tiZANidine (ZANAFLEX) 2 MG tablet Take 1 mg by mouth every 6 (six) hours as needed (leg cramps).  01/16/16   [provider]  torsemide (DEMADEX) 20 MG tablet Take 4 tablets (80 mg total) by mouth daily. Patient taking differently: Take 40 mg by mouth 2 (two) times daily.  10/14/17   Shirley Friar, PA-C     Allergies:     Allergies  Allergen Reactions  . Codeine Nausea And Vomiting     Physical Exam:   Vitals  Blood pressure 121/71, pulse 90, temperature 98.9 F (37.2 C), temperature source Oral, resp. rate 12, SpO2 97 %.   1. General lying in bed in NAD,    2. Normal affect and insight, Not Suicidal or Homicidal, Awake Alert, Oriented X 3.  3. No F.N deficits, ALL C.Nerves Intact, Strength 5/5 all 4 extremities, Sensation intact all 4 extremities, Plantars down going.  4. Ears and Eyes appear Normal, Conjunctivae clear, PERRLA. Moist Oral Mucosa.  5. Supple Neck, No JVD, No cervical lymphadenopathy appriciated, No Carotid Bruits.  6. Symmetrical Chest wall movement, Good air movement bilaterally, CTAB.  7. RRR, No  Gallops, Rubs or Murmurs, No Parasternal Heave.  8. Positive Bowel Sounds, Abdomen Soft, slight tenderness RUQ, No organomegaly appriciated,No rebound -guarding or rigidity.  9.  No Cyanosis, Normal Skin Turgor, No Skin Rash or Bruise.  10. Good muscle tone,  joints appear normal , no effusions, Normal ROM.  11. No Palpable Lymph Nodes in Neck or Axillae      Data Review:    CBC Recent Labs  Lab 12/21/17 1230  WBC 8.9  HGB 13.9  HCT 42.3  PLT 147*  MCV 90.4  MCH 29.7  MCHC 32.9  RDW 16.4*   ------------------------------------------------------------------------------------------------------------------  Chemistries  Recent Labs  Lab 12/21/17 1230  NA 140  K 3.8  CL 97*  CO2 33*  GLUCOSE 62*  BUN 33*  CREATININE 1.70*  CALCIUM 9.7  AST 23  ALT 17  ALKPHOS 94  BILITOT 0.6   ------------------------------------------------------------------------------------------------------------------ CrCl cannot be calculated (Unknown ideal weight.). ------------------------------------------------------------------------------------------------------------------ No results for input(s): TSH, T4TOTAL, T3FREE, THYROIDAB in the last 72 hours.  Invalid input(s): FREET3  Coagulation profile No results for input(s): INR, PROTIME in the last 168 hours. ------------------------------------------------------------------------------------------------------------------- No results for input(s): DDIMER in the last 72 hours. -------------------------------------------------------------------------------------------------------------------  Cardiac Enzymes No results for input(s): CKMB, TROPONINI, MYOGLOBIN in the last 168 hours.  Invalid input(s): CK ------------------------------------------------------------------------------------------------------------------    Component Value Date/Time   BNP 928.6 (H) 09/30/2017 2317      ---------------------------------------------------------------------------------------------------------------  Urinalysis    Component Value Date/Time   COLORURINE YELLOW 10/08/2017 [place] 10/08/2017 1958   LABSPEC 1.012 10/08/2017 1958   PHURINE 7.0 10/08/2017 1958  GLUCOSEU NEGATIVE 10/08/2017 1958   HGBUR NEGATIVE 10/08/2017 1958   BILIRUBINUR NEGATIVE 10/08/2017 1958   KETONESUR NEGATIVE 10/08/2017 1958   PROTEINUR NEGATIVE 10/08/2017 1958   UROBILINOGEN 0.2 01/29/2015 1716   NITRITE NEGATIVE 10/08/2017 1958   LEUKOCYTESUR NEGATIVE 10/08/2017 1958    ----------------------------------------------------------------------------------------------------------------   Imaging Results:    US Abdomen Limited Ruq  Result Date: 12/21/2017 CLINICAL DATA:  Upper abdominal pain. EXAM: ULTRASOUND ABDOMEN LIMITED RIGHT UPPER QUADRANT COMPARISON:  Right upper quadrant ultrasound 10/02/2017 FINDINGS: Gallbladder: A 1.6 cm shadowing stone is present at the neck of the gallbladder. Gallbladder wall thickness is upper limits of normal at 2.8 mm. No sonographic Percell Miller sign is reported. Common bile duct: Diameter: 4.1 mm, within normal limits Liver: No focal lesion identified. Within normal limits in parenchymal echogenicity. Portal vein is patent on color Doppler imaging with normal direction of blood flow towards the liver. IMPRESSION: Cholelithiasis without evidence for cholecystitis. Stable appearance of stone at the neck of the gallbladder. Electronically Signed   By: [name] M.D.   On: 12/21/2017 15:48      Assessment & Plan:    Principal Problem:   RUQ abdominal pain Active Problems:   Diabetes mellitus type 2, uncontrolled ([place])   Pulmonary hypertension ([facility])   Renal insufficiency    RUQ pain NPO PPI Pt medically cleared to have surgery  Surgery consulted  Nausea vomitting  Zofran 80m iv q6h prn   Pulmonary hypertension Cont  Torsemide 466mpo bid Cardiology consult for cardiac clearance called by ED  Renal insufficiency Avoid nephrotoxic agents Check cmp in am   Dm2 HOLD metformin, glimepiride fsbs q4h, ISS  Hyperlipidemia Cont Lipitor 4036mo qhs  Urinary incontinence Cont Toviaz  BPH Cont Finasteride 5mg52m qday  Anxiety Cont lorazepam prn Cont Zoloft  DVT Prophylaxis defer to surgery  SCDs for now  AM Labs Ordered, also please review Full Orders  Family Communication: Admission, patients condition and plan of care including tests being ordered have been discussed with the patient  who indicate understanding and agree with the plan and Code Status.  Code Status   DNR  Likely DC to  home  Condition GUARDED    Consults called: surgery by Ed, cardiology by Ed  Admission status: observation  Time spent in minutes : 70   JameJani Gravel on 12/21/2017 at 8:09 PM  Between 7am to 7pm - Pager - [phone] 7pm go to [email or web address] - password [email or web address] - Office  [phone]

## 2017-12-22 ENCOUNTER — Other Ambulatory Visit: Payer: Self-pay

## 2017-12-22 DIAGNOSIS — Z87442 Personal history of urinary calculi: Secondary | ICD-10-CM | POA: Diagnosis not present

## 2017-12-22 DIAGNOSIS — F329 Major depressive disorder, single episode, unspecified: Secondary | ICD-10-CM | POA: Diagnosis present

## 2017-12-22 DIAGNOSIS — Z01818 Encounter for other preprocedural examination: Secondary | ICD-10-CM | POA: Diagnosis not present

## 2017-12-22 DIAGNOSIS — K8020 Calculus of gallbladder without cholecystitis without obstruction: Secondary | ICD-10-CM | POA: Diagnosis present

## 2017-12-22 DIAGNOSIS — R6250 Unspecified lack of expected normal physiological development in childhood: Secondary | ICD-10-CM | POA: Diagnosis present

## 2017-12-22 DIAGNOSIS — Z66 Do not resuscitate: Secondary | ICD-10-CM | POA: Diagnosis present

## 2017-12-22 DIAGNOSIS — Z6832 Body mass index (BMI) 32.0-32.9, adult: Secondary | ICD-10-CM | POA: Diagnosis not present

## 2017-12-22 DIAGNOSIS — N183 Chronic kidney disease, stage 3 (moderate): Secondary | ICD-10-CM | POA: Diagnosis present

## 2017-12-22 DIAGNOSIS — E1122 Type 2 diabetes mellitus with diabetic chronic kidney disease: Secondary | ICD-10-CM | POA: Diagnosis present

## 2017-12-22 DIAGNOSIS — E785 Hyperlipidemia, unspecified: Secondary | ICD-10-CM | POA: Diagnosis present

## 2017-12-22 DIAGNOSIS — I130 Hypertensive heart and chronic kidney disease with heart failure and stage 1 through stage 4 chronic kidney disease, or unspecified chronic kidney disease: Secondary | ICD-10-CM | POA: Diagnosis present

## 2017-12-22 DIAGNOSIS — J9611 Chronic respiratory failure with hypoxia: Secondary | ICD-10-CM | POA: Diagnosis present

## 2017-12-22 DIAGNOSIS — I5032 Chronic diastolic (congestive) heart failure: Secondary | ICD-10-CM | POA: Diagnosis present

## 2017-12-22 DIAGNOSIS — R1011 Right upper quadrant pain: Secondary | ICD-10-CM

## 2017-12-22 DIAGNOSIS — N400 Enlarged prostate without lower urinary tract symptoms: Secondary | ICD-10-CM | POA: Diagnosis present

## 2017-12-22 DIAGNOSIS — I2721 Secondary pulmonary arterial hypertension: Secondary | ICD-10-CM | POA: Diagnosis present

## 2017-12-22 DIAGNOSIS — R32 Unspecified urinary incontinence: Secondary | ICD-10-CM | POA: Diagnosis present

## 2017-12-22 DIAGNOSIS — I5082 Biventricular heart failure: Secondary | ICD-10-CM | POA: Diagnosis present

## 2017-12-22 DIAGNOSIS — E662 Morbid (severe) obesity with alveolar hypoventilation: Secondary | ICD-10-CM | POA: Diagnosis present

## 2017-12-22 DIAGNOSIS — K219 Gastro-esophageal reflux disease without esophagitis: Secondary | ICD-10-CM | POA: Diagnosis present

## 2017-12-22 DIAGNOSIS — I272 Pulmonary hypertension, unspecified: Secondary | ICD-10-CM | POA: Diagnosis not present

## 2017-12-22 DIAGNOSIS — I2510 Atherosclerotic heart disease of native coronary artery without angina pectoris: Secondary | ICD-10-CM | POA: Diagnosis present

## 2017-12-22 DIAGNOSIS — Z9981 Dependence on supplemental oxygen: Secondary | ICD-10-CM | POA: Diagnosis not present

## 2017-12-22 DIAGNOSIS — I2781 Cor pulmonale (chronic): Secondary | ICD-10-CM | POA: Diagnosis present

## 2017-12-22 DIAGNOSIS — K2270 Barrett's esophagus without dysplasia: Secondary | ICD-10-CM | POA: Diagnosis present

## 2017-12-22 DIAGNOSIS — N289 Disorder of kidney and ureter, unspecified: Secondary | ICD-10-CM

## 2017-12-22 DIAGNOSIS — F419 Anxiety disorder, unspecified: Secondary | ICD-10-CM | POA: Diagnosis present

## 2017-12-22 DIAGNOSIS — E1165 Type 2 diabetes mellitus with hyperglycemia: Secondary | ICD-10-CM | POA: Diagnosis present

## 2017-12-22 LAB — URINALYSIS, ROUTINE W REFLEX MICROSCOPIC
Bilirubin Urine: NEGATIVE
Glucose, UA: NEGATIVE mg/dL
Hgb urine dipstick: NEGATIVE
Ketones, ur: NEGATIVE mg/dL
LEUKOCYTES UA: NEGATIVE
NITRITE: NEGATIVE
PROTEIN: NEGATIVE mg/dL
Specific Gravity, Urine: 1.011 (ref 1.005–1.030)
pH: 5 (ref 5.0–8.0)

## 2017-12-22 LAB — COMPREHENSIVE METABOLIC PANEL
ALK PHOS: 90 U/L (ref 38–126)
ALT: 19 U/L (ref 0–44)
ANION GAP: 9 (ref 5–15)
AST: 27 U/L (ref 15–41)
Albumin: 3.8 g/dL (ref 3.5–5.0)
BILIRUBIN TOTAL: 0.6 mg/dL (ref 0.3–1.2)
BUN: 30 mg/dL — ABNORMAL HIGH (ref 6–20)
CALCIUM: 9.7 mg/dL (ref 8.9–10.3)
CO2: 36 mmol/L — ABNORMAL HIGH (ref 22–32)
Chloride: 97 mmol/L — ABNORMAL LOW (ref 98–111)
Creatinine, Ser: 1.66 mg/dL — ABNORMAL HIGH (ref 0.61–1.24)
GFR calc non Af Amer: 45 mL/min — ABNORMAL LOW (ref >60)
GFR, EST AFRICAN AMERICAN: 52 mL/min — AB (ref >60)
Glucose, Bld: 115 mg/dL — ABNORMAL HIGH (ref 70–99)
Potassium: 4.3 mmol/L (ref 3.5–5.1)
Sodium: 142 mmol/L (ref 135–145)
TOTAL PROTEIN: 7.5 g/dL (ref 6.5–8.1)

## 2017-12-22 LAB — GLUCOSE, CAPILLARY
GLUCOSE-CAPILLARY: 117 mg/dL — AB (ref 70–99)
GLUCOSE-CAPILLARY: 182 mg/dL — AB (ref 70–99)
GLUCOSE-CAPILLARY: 109 mg/dL — AB (ref 70–99)
Glucose-Capillary: 106 mg/dL — ABNORMAL HIGH (ref 70–99)
Glucose-Capillary: 111 mg/dL — ABNORMAL HIGH (ref 70–99)

## 2017-12-22 LAB — SURGICAL PCR SCREEN
MRSA, PCR: NEGATIVE
Staphylococcus aureus: POSITIVE — AB

## 2017-12-22 LAB — CBC
HCT: 43.2 % (ref 39.0–52.0)
HEMOGLOBIN: 14.1 g/dL (ref 13.0–17.0)
MCH: 29.9 pg (ref 26.0–34.0)
MCHC: 32.6 g/dL (ref 30.0–36.0)
MCV: 91.7 fL (ref 78.0–100.0)
Platelets: 148 10*3/uL — ABNORMAL LOW (ref 150–400)
RBC: 4.71 MIL/uL (ref 4.22–5.81)
RDW: 16.3 % — ABNORMAL HIGH (ref 11.5–15.5)
WBC: 11.5 10*3/uL — ABNORMAL HIGH (ref 4.0–10.5)

## 2017-12-22 LAB — PROTIME-INR
INR: 1.02
PROTHROMBIN TIME: 13.3 s (ref 11.4–15.2)

## 2017-12-22 LAB — APTT: aPTT: 32 seconds (ref 24–36)

## 2017-12-22 MED ORDER — INSULIN ASPART 100 UNIT/ML ~~LOC~~ SOLN
0.0000 [IU] | SUBCUTANEOUS | Status: DC
  Administered 2017-12-22: 2 [IU] via SUBCUTANEOUS

## 2017-12-22 MED ORDER — MUPIROCIN 2 % EX OINT
1.0000 "application " | TOPICAL_OINTMENT | Freq: Two times a day (BID) | CUTANEOUS | Status: DC
  Administered 2017-12-22 – 2017-12-23 (×4): 1 via NASAL
  Filled 2017-12-22 (×2): qty 22

## 2017-12-22 NOTE — Progress Notes (Signed)
Initial Nutrition Assessment  DOCUMENTATION CODES:   Obesity unspecified  INTERVENTION:   -RD will follow for diet advancement and adjust supplement regimen as appropriate -Boost Breeze po TID, each supplement provides 250 kcal and 9 grams of protein -MVI with minerals daily  NUTRITION DIAGNOSIS:   Inadequate oral intake related to altered GI function as evidenced by (clear liquid diet).  GOAL:   Patient will meet greater than or equal to 90% of their needs  MONITOR:   PO intake, Supplement acceptance, Diet advancement, Labs, Weight trends, Skin, I & O's  REASON FOR ASSESSMENT:   Malnutrition Screening Tool    ASSESSMENT:   Tony Jones  is a 57 y.o. male, w gerd, barretts esophagus, hypertension, dm2, pulmonary hypertension on 6L o2 [place], presents with c/o RUQ pain and n/v starting earlier today.  Pt denies fever, chills, cp, palp, sob, diarrhea, brbpr, black stool.    Pt admitted with cholelithiasis. Per general surgery notes, plan for possible cholecystectomy tomorrow (12/23/17).   Pt and family member sleeping soundly at time of visit. They did not arouse when spoken to.   Reviewed wt hx; noted pt has experienced a 17.8% wt loss over the past 3 months, which is significant for time frame. However, unable to obtain further nutrition history at this time.  Pt on a clear liquid diet and tolerating well. Noted 100% meal completion.  Last Hgb A1c: 4.7 (11/10/17). PTA DM medications are 2 mg glimepiride daily and 1000 mg metformin BID.   Labs reviewed: CBGS: 85-111 (inpatient orders for glycemic control are 0-9 units insulin aspart every 4 hours).   NUTRITION - FOCUSED PHYSICAL EXAM:    Most Recent Value  Orbital Region  No depletion  Upper Arm Region  No depletion  Thoracic and Lumbar Region  No depletion  Buccal Region  No depletion  Temple Region  No depletion  Clavicle Bone Region  No depletion  Clavicle and Acromion Bone Region  No depletion  Scapular Bone Region   No depletion  Dorsal Hand  No depletion  Patellar Region  No depletion  Anterior Thigh Region  No depletion  Posterior Calf Region  No depletion  Edema (RD Assessment)  None  Hair  Reviewed  Eyes  Reviewed  Mouth  Reviewed  Skin  Reviewed  Nails  Reviewed       Diet Order:   Diet Order            Diet NPO time specified Except for: Sips with Meds  Diet effective midnight        Diet clear liquid Room service appropriate? Yes; Fluid consistency: Thin  Diet effective now              EDUCATION NEEDS:   No education needs have been identified at this time  Skin:  Skin Assessment: Reviewed RN Assessment  Last BM:  12/20/17  Height:   Ht Readings from Last 1 Encounters:  12/21/17 5\' 1"  (1.549 m)    Weight:   Wt Readings from Last 1 Encounters:  12/21/17 77.3 kg    Ideal Body Weight:  50.9 kg  BMI:  Body mass index is 32.2 kg/m.  Estimated Nutritional Needs:   Kcal:  1600-1800  Protein:  80-95 grams  Fluid:  1.6-1.8 L    Fedrick Cefalu A. Jimmye Norman, RD, LDN, CDE Pager: [phone] After hours Pager: [phone]

## 2017-12-22 NOTE — Consult Note (Addendum)
Advanced Heart Failure Team Consult Note   Primary Physician: Angelica Pou, MD PCP-Cardiologist:  Glenetta Hew, MD  Reason for Consultation: surgical clearance for cholecystectomy   HPI:    Tony Jones is seen today for evaluation of surgical clearence at the request of Dr Horris Latino.   Tony Jones is a 57 y.o. male  with history of developmental delay, CKD stage 3, chronic diastolic CHF with prominent RV failure and pulmonary hypertension, OHS/OSA unable to tolerate CPAP, GERD, barretts esophagus, and diabetes.  Last seen in HF clinic 11/08/17. He was doing well. He had weaned O2 from 10 to 6-8 with addition of sildenafil. No changes were made.   He presented to [facility] 8/21 with RUQ pain and nausea and vomiting x 1 day due to a "gall bladder attack". He had similar episode over the weekend with abdominal pain.   Pertinent admission labs include: K 3.8, Na 140, creatinine 1.70, WBC 8.9, hemoglobin 13.9 Abd [place] 12/21/17: Cholelithiasis without evidence for cholecystitis. Stable appearance of stone at the neck of the gallbladder CXR: CM with mild vascular congestion  He is on clear liquid diet and NPO at [facility] for possible cholecystectomy due to symptomatic gall stones. HF team consulted for cardiac clearance. His symptoms have improved on clear liquid diet. No further abdominal pain since yesterday am. He has significant cognitive dysfunction at baseline, and mother answers for him most questions. She is frustrated with "waiting" for procedure. Explained to her the sensitive nature of his lung/heart disease combined with his cholecystitis, and all procedures must be approached with great care. She states if he doesn't get surgery tomorrow, she would like to just take him home. He has lost 30 lbs since April, and has decreased his O2 requirement from 10 (high flow) to 5 on [place].     Echo 08/2017: EF 60-65%, grade 1 DD, RV normal, mild TR, PA peak pressure 75 mm Hg, trivial pericardial  effusion  RHC (6/19): mean RA 16, PA 93/51 mean 67, mean 27, CI 2.65, PVR 8 WU  High resolution chest CT (6/19): no definite ILD.   LHC (6/19) with 50% ostial PDA, otherwise luminal irregularities.   Review of Systems: [y] = yes, _0  = no   General: Weight gain _1 ; Weight loss _2 ; Anorexia _3 ; Fatigue [y]; Fever _4 ; Chills _5 ; Weakness _6   Cardiac: Chest pain/pressure _7 ; Resting SOB _8 ; Exertional SOB [y]; Orthopnea _9 ; Pedal Edema _10 ; Palpitations _11 ; Syncope _12 ; Presyncope _13 ; Paroxysmal nocturnal dyspnea_14   Pulmonary: Cough _15 ; Wheezing_16 ; Hemoptysis_17 ; Sputum _18 ; Snoring _19   GI: Vomiting[ y]; Dysphagia_20 ; Melena_21 ; Hematochezia _22 ; Heartburn_23 ; Abdominal pain [y]; Constipation _24 ; Diarrhea _25 ; BRBPR _26   GU: Hematuria_27 ; Dysuria _28 ; Nocturia_29   Vascular: Pain in legs with walking _30 ; Pain in feet with lying flat _31 ; Non-healing sores _32 ; Stroke _33 ; TIA _34 ; Slurred speech _35 ;  Neuro: Headaches_36 ; Vertigo_37 ; Seizures_38 ; Paresthesias_39 ;Blurred vision _40 ; Diplopia _41 ; Vision changes _42   Ortho/Skin: Arthritis [y]; Joint pain [y]; Muscle pain _43 ; Joint swelling _44 ; Back Pain _45 ; Rash _46   Psych: Depression_47 ; Anxiety_48   Heme: Bleeding problems _49 ; Clotting disorders _50 ; Anemia _51   Endocrine: Diabetes _52 ; Thyroid dysfunction_53   Home Medications Prior to Admission medications  Medication Sig Start Date End Date Taking? Authorizing Provider  acetaminophen (TYLENOL) 650 MG CR tablet Take 1,300 mg by mouth daily as needed for pain.   Yes [provider]  allopurinol (ZYLOPRIM) 300 MG tablet Take 300 mg by mouth daily.  01/18/15  Yes [provider]  atorvastatin (LIPITOR) 40 MG tablet Take 1 tablet (40 mg total) by mouth daily at 6 PM. 10/13/17  Yes Tillery, Satira Mccallum, PA-C  diclofenac sodium (VOLTAREN) 1 % GEL Apply 1 application topically 2 (two) times daily as needed (leg pain).   Yes [provider]  docusate  sodium (COLACE) 100 MG capsule Take 100 mg by mouth daily as needed for mild constipation.    Yes [provider]  fesoterodine (TOVIAZ) 8 MG TB24 tablet Take 8 mg by mouth daily with supper.   Yes [provider]  finasteride (PROSCAR) 5 MG tablet Take 5 mg by mouth daily.   Yes [provider]  glimepiride (AMARYL) 2 MG tablet Take 2 mg by mouth daily.    Yes [provider]  levocetirizine (XYZAL) 5 MG tablet Take 5 mg by mouth every evening.    Yes [provider]  LORazepam (ATIVAN) 0.5 MG tablet Take 0.5 mg by mouth 2 (two) times daily as needed for anxiety.   Yes [provider]  meclizine (ANTIVERT) 25 MG tablet Take 25 mg by mouth 3 (three) times daily as needed for dizziness.  07/18/17  Yes [provider]  metFORMIN (GLUCOPHAGE) 1000 MG tablet Take 1,000 mg by mouth 2 (two) times daily with a meal. 07/08/17  Yes [provider]  midodrine (PROAMATINE) 10 MG tablet Take 10 mg by mouth 3 (three) times daily with meals.   Yes [provider]  Multiple Vitamin (MULTIVITAMIN WITH MINERALS) TABS tablet Take 1 tablet by mouth daily with breakfast.    Yes [provider]  pantoprazole (PROTONIX) 40 MG tablet Take 40 mg by mouth 2 (two) times daily with a meal.    Yes [provider]  potassium chloride SA (K-DUR,KLOR-CON) 20 MEQ tablet Take 1 tablet (20 mEq total) by mouth daily. 10/14/17  Yes Shirley Friar, PA-C  Propylene Glycol (SYSTANE BALANCE OP) Place 1 drop into both eyes 2 (two) times daily as needed (dry eyes).   Yes [provider]  sertraline (ZOLOFT) 100 MG tablet Take 100 mg by mouth daily with breakfast. 05/22/17  Yes [provider]  tiZANidine (ZANAFLEX) 2 MG tablet Take 1 mg by mouth every 6 (six) hours as needed for muscle spasms.  01/16/16  Yes [provider]  torsemide (DEMADEX) 20 MG tablet Take 4 tablets (80 mg total) by mouth daily. Patient  taking differently: Take 40 mg by mouth 2 (two) times daily.  10/14/17  Yes Shirley Friar, PA-C  triamcinolone cream (KENALOG) 0.1 % Apply 1 application topically See admin instructions. Apply to arms daily   Yes [provider]   Past Medical History: Past Medical History:  Diagnosis Date  . Acute kidney injury ([place])   . Anemia   . Anxiety   . Barrett esophagus   . Cholelithiasis   . Chronic respiratory failure with hypoxia ([place])   . Developmental delay   . Diabetes mellitus type 2, uncontrolled ([place])   . Dyslipidemia   . GERD (gastroesophageal reflux disease)   . Hypertension   . Mental disorder   . Nephrolithiasis   . Pulmonary hypertension ([place])   . Right heart failure (  [place])    Past Surgical History: Past Surgical History:  Procedure Laterality Date  . CYSTOSCOPY/URETEROSCOPY/HOLMIUM LASER/STENT PLACEMENT Right 11/14/2017   Procedure: CYSTOSCOPY/URETEROSCOPY/RETROGRADE/STENT PLACEMENT;  Surgeon: [name], MD;  Location: [facility];  Service: Urology;  Laterality: Right;  75 MINS  SPINAL OR MAC ANESTHESIA  ALSO EXTRACTION OF STONE  . ESOPHAGOGASTRODUODENOSCOPY N/A 08/02/2017   Procedure: ESOPHAGOGASTRODUODENOSCOPY (EGD);  Surgeon: [name], MD;  Location: [facility];  Service: Endoscopy;  Laterality: N/A;  . GIVENS CAPSULE STUDY N/A 10/12/2017   Procedure: GIVENS CAPSULE STUDY;  Surgeon: [name], MD;  Location: [facility];  Service: Endoscopy;  Laterality: N/A;  . RIGHT/LEFT HEART CATH AND CORONARY ANGIOGRAPHY N/A 10/05/2017   Procedure: RIGHT/LEFT HEART CATH AND CORONARY ANGIOGRAPHY;  Surgeon: [name], MD;  Location: [facility];  Service: Cardiovascular;  Laterality: N/A;  . THROAT SURGERY    . TRANSTHORACIC ECHOCARDIOGRAM  01/2015   Normal LV size.  Mild LVH.  EF 55-60%.  No RWMA.  Questionable diastolic parameters.  Mild RV and RA dilation.   Family History: Family History  Problem Relation Age of Onset  . Congestive Heart  Failure Father   . Heart disease Father   . Heart attack Father   . Atrial fibrillation Father   . Brain cancer Father   . Melanoma Father   . Diabetes Mother   . Stroke Mother   . Hypertension Mother   . Fainting Paternal Grandfather   . Congestive Heart Failure Paternal Grandfather   . Atrial fibrillation Paternal Grandfather    Social History: Social History   Socioeconomic History  . Marital status: Single    Spouse name: Not on file  . Number of children: Not on file  . Years of education: Not on file  . Highest education level: Not on file  Occupational History  . Occupation: unemployed  Social Needs  . Financial resource strain: Not on file  . Food insecurity:    Worry: Not on file    Inability: Not on file  . Transportation needs:    Medical: Not on file    Non-medical: Not on file  Tobacco Use  . Smoking status: Never Smoker  . Smokeless tobacco: Never Used  Substance and Sexual Activity  . Alcohol use: No  . Drug use: No  . Sexual activity: Never  Lifestyle  . Physical activity:    Days per week: Not on file    Minutes per session: Not on file  . Stress: Not on file  Relationships  . Social connections:    Talks on phone: Not on file    Gets together: Not on file    Attends religious service: Not on file    Active member of club or organization: Not on file    Attends meetings of clubs or organizations: Not on file    Relationship status: Not on file  Other Topics Concern  . Not on file  Social History Narrative  . Not on file    Allergies:  Allergies  Allergen Reactions  . Codeine Nausea And Vomiting    Objective:    Vital Signs:   Temp:  [98 F (36.7 C)-99.1 F (37.3 C)] 99.1 F (37.3 C) (08/22 1219) Pulse Rate:  [83-112] 87 (08/22 1219) Resp:  [12-18] 16 (08/22 1219) BP: (111-124)/(67-85) 112/75 (08/22 1219) SpO2:  [90 %-100 %] 96 % (08/22 1219) Weight:  [77.3 kg] 77.3 kg (08/21 2347) Last BM Date: 12/20/17(per  pt/family)  Weight change: [employer]  12/21/17 2347  Weight: 77.3 kg   Intake/Output:   Intake/Output Summary (Last 24 hours) at 12/22/2017 1314 Last data filed at 12/22/2017 3291 Gross per 24 hour  Intake 502.61 ml  Output -  Net 502.61 ml    Physical Exam    General: NAD, seated upright in chair.  HEENT: normal Neck: supple. JVP 8-9 cm. Carotids 2+ bilat; no bruits. No lymphadenopathy or thyromegaly appreciated. Cor: PMI nondisplaced. Regular rate & rhythm. No rubs, gallops or murmurs. Lungs: clear Abdomen: Soft, no significant tenderness, nondistended. No hepatosplenomegaly. No bruits or masses. Good bowel sounds. Extremities: no cyanosis, clubbing, or rash. Trace ankle edema at most.  Neuro: alert & orientedx3, cranial nerves grossly intact. moves all 4 extremities w/o difficulty. Affect pleasant  Telemetry   NSR 80s, personally reviewed.   EKG    09/30/17: NSR 98 bpm. Personally reviewed.   Labs   Basic Metabolic Panel: Recent Labs  Lab 12/21/17 1230 12/22/17 0629  NA 140 142  K 3.8 4.3  CL 97* 97*  CO2 33* 36*  GLUCOSE 62* 115*  BUN 33* 30*  CREATININE 1.70* 1.66*  CALCIUM 9.7 9.7    Liver Function Tests: Recent Labs  Lab 12/21/17 1230 12/22/17 0629  AST 23 27  ALT 17 19  ALKPHOS 94 90  BILITOT 0.6 0.6  PROT 7.7 7.5  ALBUMIN 4.0 3.8   Recent Labs  Lab 12/21/17 1230  LIPASE 71*   No results for input(s): AMMONIA in the last 168 hours.  CBC: Recent Labs  Lab 12/21/17 1230 12/22/17 0629  WBC 8.9 11.5*  HGB 13.9 14.1  HCT 42.3 43.2  MCV 90.4 91.7  PLT 147* 148*    Cardiac Enzymes: No results for input(s): CKTOTAL, CKMB, CKMBINDEX, TROPONINI in the last 168 hours.  BNP: BNP (last 3 results) Recent Labs    07/30/17 0950 09/30/17 2317  BNP 290.8* 928.6*    ProBNP (last 3 results) No results for input(s): PROBNP in the last 8760 hours.   CBG: Recent Labs  Lab 12/21/17 2042 12/21/17 2224 12/22/17 0342  12/22/17 0721 12/22/17 1216  GLUCAP 117* 85 106* 111* 117*    Coagulation Studies: Recent Labs    12/22/17 0629  LABPROT 13.3  INR 1.02     Imaging   Dg Chest 2 View  Result Date: 12/21/2017 CLINICAL DATA:  Preop right flank pain EXAM: CHEST - 2 VIEW COMPARISON:  10/05/2017, 10/03/2017 FINDINGS: Cardiomegaly with mild central vascular congestion. Streaky bibasilar opacity at the bases, possible atelectasis or chronic interstitial change. No large pleural effusion. No pneumothorax. Degenerative changes of the spine. IMPRESSION: Cardiomegaly with mild vascular congestion. Streaky bibasilar atelectasis. Electronically Signed   By: Donavan Foil M.D.   On: 12/21/2017 21:11   US Abdomen Limited Ruq  Result Date: 12/21/2017 CLINICAL DATA:  Upper abdominal pain. EXAM: ULTRASOUND ABDOMEN LIMITED RIGHT UPPER QUADRANT COMPARISON:  Right upper quadrant ultrasound 10/02/2017 FINDINGS: Gallbladder: A 1.6 cm shadowing stone is present at the neck of the gallbladder. Gallbladder wall thickness is upper limits of normal at 2.8 mm. No sonographic Percell Miller sign is reported. Common bile duct: Diameter: 4.1 mm, within normal limits Liver: No focal lesion identified. Within normal limits in parenchymal echogenicity. Portal vein is patent on color Doppler imaging with normal direction of blood flow towards the liver. IMPRESSION: Cholelithiasis without evidence for cholecystitis. Stable appearance of stone at the neck of the gallbladder. Electronically Signed   By: [name] M.D.   On: 12/21/2017 15:48  Medications:     Current Medications: . allopurinol  300 mg Oral Daily  . atorvastatin  40 mg Oral q1800  . insulin aspart  0-9 Units Subcutaneous Q4H  . loratadine  10 mg Oral QHS  . midodrine  10 mg Oral TID WC  . multivitamin with minerals  1 tablet Oral Q breakfast  . mupirocin ointment  1 application Nasal BID  . pantoprazole  40 mg Oral BID WC  . potassium chloride SA  20 mEq Oral  Daily  . sertraline  100 mg Oral Q breakfast  . torsemide  40 mg Oral BID     Infusions:     Patient Profile   Tony Jones is a 57 y.o. male with history of developmental delay, CKD stage 3, chronic diastolic CHF with prominent RV failure and pulmonary hypertension, OHS/OSA unable to tolerate CPAP, GERD, barretts esophagus, and diabetes.  He presented to [facility] 8/21 with RUQ pain and nausea and vomiting.   Assessment/Plan   1. Surgical clearance for cholecystectomy in setting of symptomatic gallstones. - He is at very high, leaning towards prohibitive risk for peri-operative complications due to his severe PAH and RV failure. He has had significant weight loss since April/May and decreased oxygen demand, which mitigates this somewhat.  - Discussed personally with CCS. Pt had propofol in July for urologic procedure, but cholecystectomy would require deeper sedation and paralysis, and involve much higher risk with the increase abdominal pressure associated with cholecystectomy. He is not a candidate for the C-Tube, as this would be unlikely to relieve his symptomatic gall stones.   With improvement of symptoms on clear diet, potentially plan for conservative management. This is the much preferred route, as above, pt is at significant risk.  - Discussed at length with patient and Mother. Pts mother has previously deferred speaking about pts prognosis. She verbalized to me today that if death were a possible risk of surgery, she would be very unlikely to wish to proceed.  2. Pulmonary hypertension with RV failure/cor pulmonale:  - Suspect that the pulmonary hypertension is a combination of group 2 (diastolic CHF) and primarily group 3 PH (OHS/OSA).  He is on high flow oxygen at all times but cannot tolerate CPAP.  High resolution CT chest was not suggestive of ILD.  V/Q scan did not show chronic PEs in 2016.  - RHC (6/19): mean RA 16, PA 93/51 mean 67, mean 27, CI 2.65, PVR 8 WU - Volume status  looks OK on exam.  - Continues on high flow oxygen 5 L via [place].  - He has been off sildenafil for sometime. His mother stopped giving it to him, so it was removed from his med list.    - Continue torsemide 40 mg BID - Continue midodrine 10 mg TID to keep BP up.  3. Chronic diastolic CHF:  - In addition to RV failure, RHC showed elevated PCWP.  - Echo 09/28/2017 EF 60-65% on echo.   - Volume status looks OK on exam. - Continue torsemide as above.  4. CKD: Stage 3 - Creatinine stable 1.66 today. 5. Hx of GI bleeding: Capsule negative in 6/19.  1 episode of maroon stool since discharge.  - Hemoglobin 14.1 6. Nephrolithiasis: - Tolerate nephrolithiasis urological procedure with propfol. 7. 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  - Continue atorvastatin 40 mg daily.  - Hold off on ASA 81 with recent GI bleeding.   Discussed personally with CCS PA. Pt is at significant, possibly prohibitive risk  of complications from surgery. With improvement of symptoms on clear diet, would plan for conservative therapy. MD to see as well.   Medication concerns reviewed with patient and pharmacy team. Barriers identified: Cognitive delay at baseline.   Length of Stay: 0  Annamaria Helling  12/22/2017, 1:14 PM  Advanced Heart Failure Team Pager [phone] (M-F; 7a - 4p)  Please contact [facility] for night-coverage after hours (4p -7a ) and weekends on [email or web address]  Patient seen and examined with the above-signed Advanced Practice Provider and/or Housestaff. I personally reviewed laboratory data, imaging studies and relevant notes. I independently examined the patient and formulated the important aspects of the plan. I have edited the note to reflect any of my changes or salient points. I have personally discussed the plan with the patient and/or family.  57 y/o male with developmental delay, nonobstructive CAD, CKD 3 and severe PAH followed Dr. Aundra Dubin. Admitted with possible cholecystitis. We have  been consulted for pre-op cardiac clearance.   Given his comorbidities, he would be extremely high risk for any type of surgery and would only proceed if absolutely necessary. Fortunately patient now feeling much better and tolerating po intake. Discussed with [facility] and now planning to treat conservatively. Discussed with patient and his mother.   On exam Lying flat  Cor RRR increased p2 mild TR Lungs CTA Ab soft nt Extremities warm no edema   Heart failure team will sign off as of 12/22/17  HF Medication Recommendations for Home: No change  Follow up as an outpatient: Arranged in Epic.   Glori Bickers, MD  9:04 PM

## 2017-12-22 NOTE — Progress Notes (Signed)
Patient does not have acute cholecystitis and is not a candidate for elective cholecystectomy.  I appreciate the assistance of the Cardiology service.  We will sign off.  Kathryne Eriksson. Dahlia Bailiff, MD, [place] [phone] [facility]

## 2017-12-22 NOTE — Progress Notes (Signed)
PROGRESS NOTE  Tony Jones ZTI:[id number] DOB: 1960-07-05 DOA: 12/21/2017 PCP: Angelica Pou, MD  HPI/Recap of past 24 hours: Tony Jones  is a 57 y.o. male with PMH significant for GERD, barretts esophagus, hypertension, DM type 2, pulmonary hypertension on 6L O2 [place], presents with c/o RUQ abdominal pain with associated nausea/vomiting for 1 day PTA. Denies any fever/chills, diarrhea. In the ED, RUQ USS showed cholelithiasis without evidence for cholecystitis. Stable appearance of stone at the neck of the gallbladder. General surgery consulted. Pt admitted for further management.  Today, pt denies any new complaints, reports resolved abdominal pain. Surgery insisting on clearance from cardiology prior to possible cholecystectomy on 12/23/17   Assessment/Plan: Principal Problem:   RUQ abdominal pain Active Problems:   Diabetes mellitus type 2, uncontrolled ([place])   Pulmonary hypertension (HCC)   Renal insufficiency  Possible biliary colic Afebrile, no leukocytosis Right upper quadrant ultrasound showed cholelithiasis without evidence of cholecystitis.  Stable appearance of stone at the neck of the gallbladder General surgery on board, n.p.o. at midnight for possible cholecystectomy on 12/23/2017.  Currently awaiting cardiology evaluation and clearance Continue CLD  Chronic respiratory failure 2/2 severe pulmonary hypertension Stable On 6 L home O2 Right/left hearth cath done on 6/19 showed severe pulmonary arterial hypertension, preserved cardiac output, nonobstructive CAD Continue torsemide  Chronic diastolic HF Appears euvolemic Echo done 5/19 shows EF of 60 to 65%, with grade 1 diastolic dysfunction, PA peak pressure of 75 mmHg Continue torsemide  CKD stage II Stable, creatinine at baseline Avoid nephrotoxic's, renally dose all meds Daily BMP  Type 2 DM A1c on 10/2017 was 4.7 SSI, Accu-Cheks, hypoglycemic protocol Hold home metformin,  glimepiride  Hypertension Stable  Hyperlipidemia Continue statins  BPH Continue finasteride  Anxiety//depression Continue Zoloft, Ativan as needed   Code Status: DNR  Family Communication: Mother at bedside  Disposition Plan: Once surgical work-up complete   Consultants:  General surgery  Cardiology for clearance  Procedures:  None  Antimicrobials:  None  DVT prophylaxis: SCDs   Objective: Vitals:   12/21/17 2202 12/21/17 2347 12/22/17 0434 12/22/17 1219  BP: 111/82  118/79 112/75  Pulse: 83  (!) 112 87  Resp: 18 14 18 16   Temp: 98.7 F (37.1 C)  98 F (36.7 C) 99.1 F (37.3 C)  TempSrc:   Oral Oral  SpO2: 100%  90% 96%  Weight:  77.3 kg    Height:  5\' 1"  (1.549 m)      Intake/Output Summary (Last 24 hours) at 12/22/2017 1340 Last data filed at 12/22/2017 0930 Gross per 24 hour  Intake 742.61 ml  Output -  Net 742.61 ml   Filed Weights   12/21/17 2347  Weight: 77.3 kg    Exam:   General: NAD  Cardiovascular: S1, S2 present  Respiratory: CTA B  Abdomen: Soft, nontender, nondistended, bowel sounds present  Musculoskeletal: No pedal edema bilaterally  Skin: Normal  Psychiatry: Normal mood   Data Reviewed: CBC: Recent Labs  Lab 12/21/17 1230 12/22/17 0629  WBC 8.9 11.5*  HGB 13.9 14.1  HCT 42.3 43.2  MCV 90.4 91.7  PLT 147* 825*   Basic Metabolic Panel: Recent Labs  Lab 12/21/17 1230 12/22/17 0629  NA 140 142  K 3.8 4.3  CL 97* 97*  CO2 33* 36*  GLUCOSE 62* 115*  BUN 33* 30*  CREATININE 1.70* 1.66*  CALCIUM 9.7 9.7   GFR: Estimated Creatinine Clearance: 43.8 mL/min (A) (by C-G formula based on SCr of 1.66  mg/dL (H)). Liver Function Tests: Recent Labs  Lab 12/21/17 1230 12/22/17 0629  AST 23 27  ALT 17 19  ALKPHOS 94 90  BILITOT 0.6 0.6  PROT 7.7 7.5  ALBUMIN 4.0 3.8   Recent Labs  Lab 12/21/17 1230  LIPASE 71*   No results for input(s): AMMONIA in the last 168 hours. Coagulation  Profile: Recent Labs  Lab 12/22/17 0629  INR 1.02   Cardiac Enzymes: No results for input(s): CKTOTAL, CKMB, CKMBINDEX, TROPONINI in the last 168 hours. BNP (last 3 results) No results for input(s): PROBNP in the last 8760 hours. HbA1C: No results for input(s): HGBA1C in the last 72 hours. CBG: Recent Labs  Lab 12/21/17 2042 12/21/17 2224 12/22/17 0342 12/22/17 0721 12/22/17 1216  GLUCAP 117* 85 106* 111* 117*   Lipid Profile: No results for input(s): CHOL, HDL, LDLCALC, TRIG, CHOLHDL, LDLDIRECT in the last 72 hours. Thyroid Function Tests: No results for input(s): TSH, T4TOTAL, FREET4, T3FREE, THYROIDAB in the last 72 hours. Anemia Panel: No results for input(s): VITAMINB12, FOLATE, FERRITIN, TIBC, IRON, RETICCTPCT in the last 72 hours. Urine analysis:    Component Value Date/Time   COLORURINE YELLOW 12/21/2017 1220   APPEARANCEUR CLEAR 12/21/2017 1220   LABSPEC 1.011 12/21/2017 1220   PHURINE 5.0 12/21/2017 1220   GLUCOSEU NEGATIVE 12/21/2017 1220   HGBUR NEGATIVE 12/21/2017 1220   [place] 12/21/2017 1220   KETONESUR NEGATIVE 12/21/2017 1220   PROTEINUR NEGATIVE 12/21/2017 1220   UROBILINOGEN 0.2 01/29/2015 1716   NITRITE NEGATIVE 12/21/2017 1220   LEUKOCYTESUR NEGATIVE 12/21/2017 1220   Sepsis Labs: @LABRCNTIP (procalcitonin:4,lacticidven:4)  ) Recent Results (from the past 240 hour(s))  Surgical PCR screen     Status: Abnormal   Collection Time: 12/22/17  2:22 AM  Result Value Ref [place] Status   MRSA, PCR NEGATIVE NEGATIVE Final   Staphylococcus aureus POSITIVE (A) NEGATIVE Final    Comment: (NOTE) The Xpert SA Assay (FDA approved for NASAL specimens in patients 31 years of age and older), is one component of a comprehensive surveillance program. It is not intended to diagnose infection nor to guide or monitor treatment. Performed at [facility], [place] [address], [place],  [address]       Studies: Dg Chest 2 View  Result  Date: 12/21/2017 CLINICAL DATA:  Preop right flank pain EXAM: CHEST - 2 VIEW COMPARISON:  10/05/2017, 10/03/2017 FINDINGS: Cardiomegaly with mild central vascular congestion. Streaky bibasilar opacity at the bases, possible atelectasis or chronic interstitial change. No large pleural effusion. No pneumothorax. Degenerative changes of the spine. IMPRESSION: Cardiomegaly with mild vascular congestion. Streaky bibasilar atelectasis. Electronically Signed   By: Donavan Foil M.D.   On: 12/21/2017 21:11   US Abdomen Limited Ruq  Result Date: 12/21/2017 CLINICAL DATA:  Upper abdominal pain. EXAM: ULTRASOUND ABDOMEN LIMITED RIGHT UPPER QUADRANT COMPARISON:  Right upper quadrant ultrasound 10/02/2017 FINDINGS: Gallbladder: A 1.6 cm shadowing stone is present at the neck of the gallbladder. Gallbladder wall thickness is upper limits of normal at 2.8 mm. No sonographic Percell Miller sign is reported. Common bile duct: Diameter: 4.1 mm, within normal limits Liver: No focal lesion identified. Within normal limits in parenchymal echogenicity. Portal vein is patent on color Doppler imaging with normal direction of blood flow towards the liver. IMPRESSION: Cholelithiasis without evidence for cholecystitis. Stable appearance of stone at the neck of the gallbladder. Electronically Signed   By: [name] M.D.   On: 12/21/2017 15:48    Scheduled Meds: . allopurinol  300 mg Oral Daily  . atorvastatin  40 mg Oral q1800  . insulin aspart  0-9 Units Subcutaneous Q4H  . loratadine  10 mg Oral QHS  . midodrine  10 mg Oral TID WC  . multivitamin with minerals  1 tablet Oral Q breakfast  . mupirocin ointment  1 application Nasal BID  . pantoprazole  40 mg Oral BID WC  . potassium chloride SA  20 mEq Oral Daily  . sertraline  100 mg Oral Q breakfast  . torsemide  40 mg Oral BID    Continuous Infusions:   LOS: 0 days     Alma Friendly, MD [facility]  If 7PM-7AM, please contact  night-coverage [email or web address] Password [email or web address] 12/22/2017, 1:40 PM

## 2017-12-22 NOTE — Progress Notes (Signed)
Patient ID: Tony Jones, male   DOB: 1960-09-12, 57 y.o.   MRN: [id number]       Subjective: Patient with no pain today.  Cards hasn't seen yet per family member in the room with patient.  Objective: Vital signs in last 24 hours: Temp:  [98 F (36.7 C)-98.9 F (37.2 C)] 98 F (36.7 C) (08/22 0434) Pulse Rate:  [83-112] 112 (08/22 0434) Resp:  [12-20] 18 (08/22 0434) BP: (111-124)/(67-86) 118/79 (08/22 0434) SpO2:  [90 %-100 %] 90 % (08/22 0434) Weight:  [77.3 kg] 77.3 kg (08/21 2347) Last BM Date: 12/20/17  Intake/Output from previous day: 08/21 0701 - 08/22 0700 In: 438.4 [P.O.:60; I.V.:378.4] Out: -  Intake/Output this shift: No intake/output data recorded.  PE: Heart: regular, but tachy Lungs: CTAB Abd: soft, NT, ND, +BS  Lab Results:  Recent Labs    12/21/17 1230 12/22/17 0629  WBC 8.9 11.5*  HGB 13.9 14.1  HCT 42.3 43.2  PLT 147* 148*   BMET Recent Labs    12/21/17 1230 12/22/17 0629  NA 140 142  K 3.8 4.3  CL 97* 97*  CO2 33* 36*  GLUCOSE 62* 115*  BUN 33* 30*  CREATININE 1.70* 1.66*  CALCIUM 9.7 9.7   PT/INR Recent Labs    12/22/17 0629  LABPROT 13.3  INR 1.02   CMP     Component Value Date/Time   NA 142 12/22/2017 0629   NA 141 09/19/2017 1214   K 4.3 12/22/2017 0629   CL 97 (L) 12/22/2017 0629   CO2 36 (H) 12/22/2017 0629   GLUCOSE 115 (H) 12/22/2017 0629   BUN 30 (H) 12/22/2017 0629   BUN 40 (H) 09/19/2017 1214   CREATININE 1.66 (H) 12/22/2017 0629   CALCIUM 9.7 12/22/2017 0629   PROT 7.5 12/22/2017 0629   ALBUMIN 3.8 12/22/2017 0629   AST 27 12/22/2017 0629   ALT 19 12/22/2017 0629   ALKPHOS 90 12/22/2017 0629   BILITOT 0.6 12/22/2017 0629   GFRNONAA 45 (L) 12/22/2017 0629   GFRAA 52 (L) 12/22/2017 0629   Lipase     Component Value Date/Time   LIPASE 71 (H) 12/21/2017 1230       Studies/Results: Dg Chest 2 View  Result Date: 12/21/2017 CLINICAL DATA:  Preop right flank pain EXAM: CHEST - 2 VIEW COMPARISON:   10/05/2017, 10/03/2017 FINDINGS: Cardiomegaly with mild central vascular congestion. Streaky bibasilar opacity at the bases, possible atelectasis or chronic interstitial change. No large pleural effusion. No pneumothorax. Degenerative changes of the spine. IMPRESSION: Cardiomegaly with mild vascular congestion. Streaky bibasilar atelectasis. Electronically Signed   By: Donavan Foil M.D.   On: 12/21/2017 21:11   US Abdomen Limited Ruq  Result Date: 12/21/2017 CLINICAL DATA:  Upper abdominal pain. EXAM: ULTRASOUND ABDOMEN LIMITED RIGHT UPPER QUADRANT COMPARISON:  Right upper quadrant ultrasound 10/02/2017 FINDINGS: Gallbladder: A 1.6 cm shadowing stone is present at the neck of the gallbladder. Gallbladder wall thickness is upper limits of normal at 2.8 mm. No sonographic Percell Miller sign is reported. Common bile duct: Diameter: 4.1 mm, within normal limits Liver: No focal lesion identified. Within normal limits in parenchymal echogenicity. Portal vein is patent on color Doppler imaging with normal direction of blood flow towards the liver. IMPRESSION: Cholelithiasis without evidence for cholecystitis. Stable appearance of stone at the neck of the gallbladder. Electronically Signed   By: [name] M.D.   On: 12/21/2017 15:48    Anti-infectives: Anti-infectives (From admission, onward)   None  Assessment/Plan Biliary colic  -nontender today, but with recurrent biliary coilc. -await cards evaluation and clearance prior to surgical intervention -clears today and NPO p MN for OR tomorrow if no further cards work up is needed.  CHF, right heart failure Pulmonary HTN, on 6L O2 at home Mental delay DM HTN  FEN - clears/NPO p MN VTE - SCDs/may have chemical prophylaxis from surgical standpoint ID - none   LOS: 0 days    Henreitta Cea , [facility] 12/22/2017, 9:31 AM Pager: [phone]

## 2017-12-22 NOTE — Progress Notes (Signed)
Patient arrived, sleepy but oriented to name, that this is a hospital, and that it is night. Patient's mother with patient. She is the legal guardian.

## 2017-12-23 LAB — CBC WITH DIFFERENTIAL/PLATELET
Abs Immature Granulocytes: 0 10*3/uL (ref 0.0–0.1)
BASOS ABS: 0 10*3/uL (ref 0.0–0.1)
BASOS PCT: 0 %
EOS ABS: 0.6 10*3/uL (ref 0.0–0.7)
EOS PCT: 6 %
HEMATOCRIT: 40.4 % (ref 39.0–52.0)
Hemoglobin: 13.4 g/dL (ref 13.0–17.0)
IMMATURE GRANULOCYTES: 0 %
LYMPHS ABS: 1 10*3/uL (ref 0.7–4.0)
Lymphocytes Relative: 10 %
MCH: 29.6 pg (ref 26.0–34.0)
MCHC: 33.2 g/dL (ref 30.0–36.0)
MCV: 89.4 fL (ref 78.0–100.0)
Monocytes Absolute: 0.4 10*3/uL (ref 0.1–1.0)
Monocytes Relative: 4 %
NEUTROS PCT: 80 %
Neutro Abs: 7.3 10*3/uL (ref 1.7–7.7)
PLATELETS: 141 10*3/uL — AB (ref 150–400)
RBC: 4.52 MIL/uL (ref 4.22–5.81)
RDW: 15.9 % — AB (ref 11.5–15.5)
WBC: 9.3 10*3/uL (ref 4.0–10.5)

## 2017-12-23 LAB — BASIC METABOLIC PANEL
Anion gap: 10 (ref 5–15)
BUN: 21 mg/dL — ABNORMAL HIGH (ref 6–20)
CALCIUM: 9.6 mg/dL (ref 8.9–10.3)
CO2: 36 mmol/L — AB (ref 22–32)
CREATININE: 1.42 mg/dL — AB (ref 0.61–1.24)
Chloride: 91 mmol/L — ABNORMAL LOW (ref 98–111)
GFR calc Af Amer: >60 mL/min (ref >60)
GFR, EST NON AFRICAN AMERICAN: 54 mL/min — AB (ref >60)
Glucose, Bld: 111 mg/dL — ABNORMAL HIGH (ref 70–99)
Potassium: 3.7 mmol/L (ref 3.5–5.1)
SODIUM: 137 mmol/L (ref 135–145)

## 2017-12-23 LAB — GLUCOSE, CAPILLARY
GLUCOSE-CAPILLARY: 108 mg/dL — AB (ref 70–99)
Glucose-Capillary: 111 mg/dL — ABNORMAL HIGH (ref 70–99)

## 2017-12-23 NOTE — Discharge Summary (Signed)
Discharge Summary  Tony Jones TIR:[id number] DOB: [birth year]  PCP: Angelica Pou, MD  Admit date: 12/21/2017 Discharge date: 12/23/2017  Time spent: 40 mins  Recommendations for Outpatient Follow-up:  1. PCP  Discharge Diagnoses:  Active Hospital Problems   Diagnosis Date Noted  . RUQ abdominal pain 12/21/2017  . Renal insufficiency 12/21/2017  . Pulmonary hypertension ([place]) 09/30/2017  . Diabetes mellitus type 2, uncontrolled ([place])     Resolved Hospital Problems  No resolved problems to display.    Discharge Condition: Stable  Diet recommendation: Heart healthy  Vitals:   12/22/17 1219 12/22/17 2056  BP: 112/75 122/77  Pulse: 87 78  Resp: 16 14  Temp: 99.1 F (37.3 C) 98.6 F (37 C)  SpO2: 96% 98%    History of present illness:  JohnMooreis a57 y.o.male with PMH significant for GERD, barretts esophagus, hypertension, DM type 2, pulmonary hypertension on 6L O2 [place], presents with c/o RUQ abdominal pain with associated nausea/vomiting for 1 day PTA. Denies any fever/chills, diarrhea. In the ED, RUQ USS showed cholelithiasis without evidence for cholecystitis. Stable appearance of stone at the neck of the gallbladder. General surgery consulted. Pt admitted for further management. Cardiology was consulted for clearance and deemed patient extremely high risk for any type of surgery unless if absolutely necessary/life threatening. Gen surgery also in agreement as pt is not a surgical candidate and currently patient doesn't have acute cholecystitis. Both teams discussed extensively with mother who is the POA.  Today, pt denies any new complaints, reports resolved abdominal pain. Eager to go home.  Hospital Course:  Principal Problem:   RUQ abdominal pain Active Problems:   Diabetes mellitus type 2, uncontrolled ([place])   Pulmonary hypertension (HCC)   Renal insufficiency  Possible biliary colic Afebrile, no leukocytosis Right upper quadrant ultrasound showed  cholelithiasis without evidence of cholecystitis.  Stable appearance of stone at the neck of the gallbladder Cardiology was consulted for clearance and deemed patient extremely high risk for any type of surgery unless if absolutely necessary/life threatening. Gen surgery also in agreement as pt is not a surgical candidate and currently patient doesn't have acute cholecystitis. Both teams discussed extensively with mother who is the POA.  Chronic respiratory failure 2/2 severe pulmonary hypertension Stable On 5-6 L home O2 Right/left hearth cath done on 6/19 showed severe pulmonary arterial hypertension, preserved cardiac output, nonobstructive CAD Continue torsemide  Chronic diastolic HF Appears euvolemic Echo done 5/19 shows EF of 60 to 65%, with grade 1 diastolic dysfunction, PA peak pressure of 75 mmHg Continue torsemide  CKD stage II Stable, creatinine at baseline Avoid nephrotoxic's, renally dose all meds Daily BMP  Type 2 DM A1c on 10/2017 was 4.7 PCP to consider adjusting home metformin, glimepiride  Hypertension Stable  Hyperlipidemia Continue statins  BPH Continue finasteride  Anxiety//depression Continue Zoloft, Ativan as needed   Procedures:  None   Consultations:  Cardiology  General surgery   Discharge Exam: BP 122/77 (BP Location: Left Arm)   Pulse 78   Temp 98.6 F (37 C) (Oral)   Resp 14   Ht 5\' 1"  (1.549 m)   Wt 77.3 kg   SpO2 98%   BMI 32.20 kg/m   General: NAD  Cardiovascular: S1, S2 present Respiratory: CTAB   Discharge Instructions You were cared for by a hospitalist during your hospital stay. If you have any questions about your discharge medications or the care you received while you were in the hospital after you are discharged, you can  call the unit and asked to speak with the hospitalist on call if the hospitalist that took care of you is not available. Once you are discharged, your primary care physician will handle any  further medical issues. Please note that NO REFILLS for any discharge medications will be authorized once you are discharged, as it is imperative that you return to your primary care physician (or establish a relationship with a primary care physician if you do not have one) for your aftercare needs so that they can reassess your need for medications and monitor your lab values.   Allergies as of 12/23/2017      Reactions   Codeine Nausea And Vomiting      Medication List    TAKE these medications   acetaminophen 650 MG CR tablet Commonly known as:  TYLENOL Take 1,300 mg by mouth daily as needed for pain.   allopurinol 300 MG tablet Commonly known as:  ZYLOPRIM Take 300 mg by mouth daily.   atorvastatin 40 MG tablet Commonly known as:  LIPITOR Take 1 tablet (40 mg total) by mouth daily at 6 PM.   diclofenac sodium 1 % Gel Commonly known as:  VOLTAREN Apply 1 application topically 2 (two) times daily as needed (leg pain).   docusate sodium 100 MG capsule Commonly known as:  COLACE Take 100 mg by mouth daily as needed for mild constipation.   finasteride 5 MG tablet Commonly known as:  PROSCAR Take 5 mg by mouth daily.   glimepiride 2 MG tablet Commonly known as:  AMARYL Take 2 mg by mouth daily.   levocetirizine 5 MG tablet Commonly known as:  XYZAL Take 5 mg by mouth every evening.   LORazepam 0.5 MG tablet Commonly known as:  ATIVAN Take 0.5 mg by mouth 2 (two) times daily as needed for anxiety.   meclizine 25 MG tablet Commonly known as:  ANTIVERT Take 25 mg by mouth 3 (three) times daily as needed for dizziness.   metFORMIN 1000 MG tablet Commonly known as:  GLUCOPHAGE Take 1,000 mg by mouth 2 (two) times daily with a meal.   midodrine 10 MG tablet Commonly known as:  PROAMATINE Take 10 mg by mouth 3 (three) times daily with meals.   multivitamin with minerals Tabs tablet Take 1 tablet by mouth daily with breakfast.   pantoprazole 40 MG tablet Commonly  known as:  PROTONIX Take 40 mg by mouth 2 (two) times daily with a meal.   potassium chloride SA 20 MEQ tablet Commonly known as:  K-DUR,KLOR-CON Take 1 tablet (20 mEq total) by mouth daily.   sertraline 100 MG tablet Commonly known as:  ZOLOFT Take 100 mg by mouth daily with breakfast.   SYSTANE BALANCE OP Place 1 drop into both eyes 2 (two) times daily as needed (dry eyes).   tiZANidine 2 MG tablet Commonly known as:  ZANAFLEX Take 1 mg by mouth every 6 (six) hours as needed for muscle spasms.   torsemide 20 MG tablet Commonly known as:  DEMADEX Take 4 tablets (80 mg total) by mouth daily. What changed:    how much to take  when to take this   TOVIAZ 8 MG Tb24 tablet Generic drug:  fesoterodine Take 8 mg by mouth daily with supper.   triamcinolone cream 0.1 % Commonly known as:  KENALOG Apply 1 application topically See admin instructions. Apply to arms daily      Allergies  Allergen Reactions  . Codeine Nausea And Vomiting  Follow-up Information    Angelica Pou, MD. Schedule an appointment as soon as possible for a visit in 1 week(s).   Specialty:  Internal Medicine Contact information: [place] [address] [phone]        Leonie Man, MD .   Specialty:  Cardiology Contact information: [address] [place] [address] [phone]            The results of significant diagnostics from this hospitalization (including imaging, microbiology, ancillary and laboratory) are listed below for reference.    Significant Diagnostic Studies: Dg Chest 2 View  Result Date: 12/21/2017 CLINICAL DATA:  Preop right flank pain EXAM: CHEST - 2 VIEW COMPARISON:  10/05/2017, 10/03/2017 FINDINGS: Cardiomegaly with mild central vascular congestion. Streaky bibasilar opacity at the bases, possible atelectasis or chronic interstitial change. No large pleural effusion. No pneumothorax. Degenerative changes of the spine.  IMPRESSION: Cardiomegaly with mild vascular congestion. Streaky bibasilar atelectasis. Electronically Signed   By: Donavan Foil M.D.   On: 12/21/2017 21:11   US Abdomen Complete  Result Date: 12/12/2017 CLINICAL DATA:  Right flank pain for 4 days EXAM: ABDOMEN ULTRASOUND COMPLETE COMPARISON:  October 02, 2017 FINDINGS: Gallbladder: No wall thickening visualized. Gallstones are noted in the gallbladder. No sonographic Murphy sign noted by sonographer. Common bile duct: Diameter: 3.8 mm Liver: No focal lesion identified. Within normal limits in parenchymal echogenicity. Portal vein is patent on color Doppler imaging with normal direction of blood flow towards the liver. IVC: No abnormality visualized. Pancreas: Limited visualization. Spleen: Size and appearance within normal limits. Right Kidney: Length: 10.8 cm. Echogenicity within normal limits. No hydronephrosis visualized. There is a 1.5 cm cyst in the right kidney. Left Kidney: Length: 11.4 cm. Echogenicity within normal limits. No mass or hydronephrosis visualized. Abdominal aorta: No aneurysm visualized. Evaluation is limited. Diameter measures 2.1 cm Other findings: None. IMPRESSION: No acute abnormality identified. No hydronephrosis noted in both kidneys. Incidental finding of 1.5 cm cyst in the right kidney. Cholelithiasis without sonographic evidence of acute cholecystitis. Electronically Signed   By: Abelardo Diesel M.D.   On: 12/12/2017 10:05   US Abdomen Limited Ruq  Result Date: 12/21/2017 CLINICAL DATA:  Upper abdominal pain. EXAM: ULTRASOUND ABDOMEN LIMITED RIGHT UPPER QUADRANT COMPARISON:  Right upper quadrant ultrasound 10/02/2017 FINDINGS: Gallbladder: A 1.6 cm shadowing stone is present at the neck of the gallbladder. Gallbladder wall thickness is upper limits of normal at 2.8 mm. No sonographic Percell Miller sign is reported. Common bile duct: Diameter: 4.1 mm, within normal limits Liver: No focal lesion identified. Within normal limits in  parenchymal echogenicity. Portal vein is patent on color Doppler imaging with normal direction of blood flow towards the liver. IMPRESSION: Cholelithiasis without evidence for cholecystitis. Stable appearance of stone at the neck of the gallbladder. Electronically Signed   By: [name] M.D.   On: 12/21/2017 15:48    Microbiology: Recent Results (from the past 240 hour(s))  Surgical PCR screen     Status: Abnormal   Collection Time: 12/22/17  2:22 AM  Result Value Ref [place] Status   MRSA, PCR NEGATIVE NEGATIVE Final   Staphylococcus aureus POSITIVE (A) NEGATIVE Final    Comment: (NOTE) The Xpert SA Assay (FDA approved for NASAL specimens in patients 2 years of age and older), is one component of a comprehensive surveillance program. It is not intended to diagnose infection nor to guide or monitor treatment. Performed at [facility], [place] [address]  [place],  [address]      Labs: Basic Metabolic Panel: Recent Labs  Lab 12/21/17 1230 12/22/17 0629 12/23/17 0548  NA 140 142 137  K 3.8 4.3 3.7  CL 97* 97* 91*  CO2 33* 36* 36*  GLUCOSE 62* 115* 111*  BUN 33* 30* 21*  CREATININE 1.70* 1.66* 1.42*  CALCIUM 9.7 9.7 9.6   Liver Function Tests: Recent Labs  Lab 12/21/17 1230 12/22/17 0629  AST 23 27  ALT 17 19  ALKPHOS 94 90  BILITOT 0.6 0.6  PROT 7.7 7.5  ALBUMIN 4.0 3.8   Recent Labs  Lab 12/21/17 1230  LIPASE 71*   No results for input(s): AMMONIA in the last 168 hours. CBC: Recent Labs  Lab 12/21/17 1230 12/22/17 0629 12/23/17 0548  WBC 8.9 11.5* 9.3  NEUTROABS  --   --  7.3  HGB 13.9 14.1 13.4  HCT 42.3 43.2 40.4  MCV 90.4 91.7 89.4  PLT 147* 148* 141*   Cardiac Enzymes: No results for input(s): CKTOTAL, CKMB, CKMBINDEX, TROPONINI in the last 168 hours. BNP: BNP (last 3 results) Recent Labs    07/30/17 0950 09/30/17 2317  BNP 290.8* 928.6*    ProBNP (last 3 results) No results for input(s): PROBNP in the last 8760  hours.  CBG: Recent Labs  Lab 12/22/17 1216 12/22/17 1613 12/22/17 2057 12/23/17 0443 12/23/17 0750  GLUCAP 117* 182* 109* 111* 108*       Signed:  Alma Friendly, MD [facility] 12/23/2017, 9:58 AM

## 2017-12-23 NOTE — Progress Notes (Signed)
Discharge  Pt was able to participate with discharge teaching with mother at the bedside. Mother was informed of the need to schedule followup appts. Pt has no complaints of pain at this time and refused a wheelchair. Pt requested to walk out.

## 2017-12-28 ENCOUNTER — Other Ambulatory Visit: Payer: Self-pay

## 2017-12-28 NOTE — Patient Outreach (Signed)
Tony Jones [facility]) Care Management  12/28/2017  Tony Jones 03-06-1961 [id number]  57 year old male outreached by [facility] services for a 30 day discharge medication review.  PMHx includes, but not limited to, diastolic heart failure, pulmonary hypertension, Type 2 diabetes mellitus, dyslipidemia and developmental delays.  Successful outreach to [name] mother, Tony Jones.   She declined [place] services for a medication review.  Joetta Manners, PharmD Clinical Pharmacist [place] [phone]

## 2018-01-03 ENCOUNTER — Ambulatory Visit (INDEPENDENT_AMBULATORY_CARE_PROVIDER_SITE_OTHER): Payer: No Typology Code available for payment source | Admitting: Cardiology

## 2018-01-03 ENCOUNTER — Encounter: Payer: Self-pay | Admitting: Cardiology

## 2018-01-03 ENCOUNTER — Ambulatory Visit (HOSPITAL_COMMUNITY)
Admission: RE | Admit: 2018-01-03 | Discharge: 2018-01-03 | Disposition: A | Discharge status: Home / Self Care | Payer: No Typology Code available for payment source | Source: Ambulatory Visit | Attending: Cardiology | Admitting: Cardiology

## 2018-01-03 VITALS — BP 122/80 | HR 80 | Wt 174.8 lb

## 2018-01-03 DIAGNOSIS — K8020 Calculus of gallbladder without cholecystitis without obstruction: Secondary | ICD-10-CM | POA: Insufficient documentation

## 2018-01-03 DIAGNOSIS — K922 Gastrointestinal hemorrhage, unspecified: Secondary | ICD-10-CM | POA: Insufficient documentation

## 2018-01-03 DIAGNOSIS — R627 Adult failure to thrive: Secondary | ICD-10-CM

## 2018-01-03 DIAGNOSIS — N200 Calculus of kidney: Secondary | ICD-10-CM | POA: Insufficient documentation

## 2018-01-03 DIAGNOSIS — N183 Chronic kidney disease, stage 3 (moderate): Secondary | ICD-10-CM | POA: Insufficient documentation

## 2018-01-03 DIAGNOSIS — I272 Pulmonary hypertension, unspecified: Secondary | ICD-10-CM | POA: Diagnosis not present

## 2018-01-03 DIAGNOSIS — R42 Dizziness and giddiness: Secondary | ICD-10-CM

## 2018-01-03 DIAGNOSIS — I5032 Chronic diastolic (congestive) heart failure: Secondary | ICD-10-CM

## 2018-01-03 DIAGNOSIS — I2510 Atherosclerotic heart disease of native coronary artery without angina pectoris: Secondary | ICD-10-CM | POA: Diagnosis not present

## 2018-01-03 DIAGNOSIS — I50812 Chronic right heart failure: Secondary | ICD-10-CM

## 2018-01-03 DIAGNOSIS — Z8249 Family history of ischemic heart disease and other diseases of the circulatory system: Secondary | ICD-10-CM | POA: Diagnosis not present

## 2018-01-03 DIAGNOSIS — G4733 Obstructive sleep apnea (adult) (pediatric): Secondary | ICD-10-CM | POA: Diagnosis not present

## 2018-01-03 DIAGNOSIS — E1122 Type 2 diabetes mellitus with diabetic chronic kidney disease: Secondary | ICD-10-CM | POA: Insufficient documentation

## 2018-01-03 DIAGNOSIS — Z7984 Long term (current) use of oral hypoglycemic drugs: Secondary | ICD-10-CM | POA: Insufficient documentation

## 2018-01-03 DIAGNOSIS — Z833 Family history of diabetes mellitus: Secondary | ICD-10-CM | POA: Insufficient documentation

## 2018-01-03 DIAGNOSIS — E785 Hyperlipidemia, unspecified: Secondary | ICD-10-CM | POA: Insufficient documentation

## 2018-01-03 DIAGNOSIS — I130 Hypertensive heart and chronic kidney disease with heart failure and stage 1 through stage 4 chronic kidney disease, or unspecified chronic kidney disease: Secondary | ICD-10-CM | POA: Insufficient documentation

## 2018-01-03 DIAGNOSIS — Z79899 Other long term (current) drug therapy: Secondary | ICD-10-CM | POA: Insufficient documentation

## 2018-01-03 DIAGNOSIS — Z823 Family history of stroke: Secondary | ICD-10-CM | POA: Diagnosis not present

## 2018-01-03 LAB — BASIC METABOLIC PANEL
Anion gap: 10 (ref 5–15)
BUN: 23 mg/dL — ABNORMAL HIGH (ref 6–20)
CALCIUM: 9.8 mg/dL (ref 8.9–10.3)
CO2: 36 mmol/L — AB (ref 22–32)
CREATININE: 1.61 mg/dL — AB (ref 0.61–1.24)
Chloride: 93 mmol/L — ABNORMAL LOW (ref 98–111)
GFR calc non Af Amer: 46 mL/min — ABNORMAL LOW (ref >60)
GFR, EST AFRICAN AMERICAN: 54 mL/min — AB (ref >60)
GLUCOSE: 62 mg/dL — AB (ref 70–99)
Potassium: 4.1 mmol/L (ref 3.5–5.1)
Sodium: 139 mmol/L (ref 135–145)

## 2018-01-03 MED ORDER — URSODIOL 300 MG PO CAPS: 300.0000 mg | ORAL_CAPSULE | Freq: Two times a day (BID) | ORAL | 6 refills | Status: DC

## 2018-01-03 MED ORDER — SACUBITRIL-VALSARTAN 49-51 MG PO TABS: 1.0000 | ORAL_TABLET | Freq: Two times a day (BID) | ORAL | 6 refills | Status: DC

## 2018-01-03 NOTE — Progress Notes (Signed)
Prescription for Ursodiol faxed to [facility] at [phone]

## 2018-01-03 NOTE — Progress Notes (Signed)
Prescription for Tony Jones was sent to pharmacy in error, med cancelled, called pharmacy and left message not to fill rx that it was sent in error.

## 2018-01-03 NOTE — Progress Notes (Signed)
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  Patient was admitted in 6/19 with acute on chronic diastolic CHF with prominent RV failure.  RHC showed elevated right and left heart filling pressures with severe pulmonary arterial hypertension.  Patient is on high flow oxygen for OHS, he cannot tolerate CPAP. He was diuresed extensively.  He had upper GI bleeding.  Capsule study was done in the hospital but was unrevealing.    He lives with his mother who takes care of him and helps with ADLs.  Admitted 12/21/17 with RUQ pain and N/V. ABD US showed cholelithiasis without evidence for cholecystitis. He improved with conservative therapy, and was also thought to overall be a very poor surgical candidate, possibly prohibitive by both HF and surgical teams.   He presents today for post hospital follow up. Doing great overall. O2 down to 5-6L at home from 10.  He is doing PT and "School" (Adult Day Care) with [facility] twice weekly. He has had no further abdominal pain and appetite is excellent. Denies SOB, lightheadedness or dizziness. Taking all medications as directed.   ECG (5/19, personally reviewed): NSR, RVH, inferior TWIs  Labs (6/19): K 3.6, creatinine 1.5  PMH: 1. Developmental delay.  2. CKD: Stage 3.  3. Barretts esophagus with GERD 4. Cholelithiasis 5. Nephrolithiasis 6. OHS/OSA: Wears high flow oxygen at all times, cannot tolerate CPAP.  7. Type II diabetes.  8. HTN 9. Hyperlipidemia 10. Upper GI bleeding: Capsule study in 6/19 was negative.  11. Coronary disease: Nonobstructive.  - LHC (6/19) with 50% ostial PDA, otherwise luminal irregularities.  12. Fe deficiency anemia: Due to GI blood losses.  13. Pulmonary hypertension  with RV failure: Suspect primarily group 3 PH due to OHS/OSA.   - RHC (6/19): mean RA 16, PA 93/51 mean 67, mean 27, CI 2.65, PVR 8 WU - V/Q scan (2016): No evidence for chronic PE.  - High resolution chest CT (6/19): no definite ILD.  - Echo (5/19): EF 60-65%, mild LVH, septal flattening with RV failure, PASP 75 mmhg.  14. Chronic diastolic CHF 15. ABIs (9/17): normal  Review of systems complete and found to be negative unless listed in HPI.    Social History   Socioeconomic History  . Marital status: Single    Spouse name: Not on file  . Number of children: Not on file  . Years of education: Not on file  . Highest education level: Not on file  Occupational History  . Occupation: unemployed  Social Needs  . Financial resource strain: Not on file  . Food insecurity:    Worry: Not on file    Inability: Not on file  . Transportation needs:    Medical: Not on file    Non-medical: Not on file  Tobacco Use  . Smoking status: Never Smoker  . Smokeless tobacco: Never Used  Substance and Sexual Activity  . Alcohol use: No  . Drug use: No  . Sexual activity: Never  Lifestyle  . Physical activity:    Days per week: Not on file    Minutes per session: Not on file  . Stress: Not on file  Relationships  . Social connections:    Talks on phone: Not on file    Gets  together: Not on file    Attends religious service: Not on file    Active member of club or organization: Not on file    Attends meetings of clubs or organizations: Not on file    Relationship status: Not on file  . Intimate partner violence:    Fear of current or ex partner: Not on file    Emotionally abused: Not on file    Physically abused: Not on file    Forced sexual activity: Not on file  Other Topics Concern  . Not on file  Social History Narrative  . Not on file   Family History  Problem Relation Age of Onset  . Congestive Heart Failure Father   . Heart disease Father   . Heart attack Father   .  Atrial fibrillation Father   . Brain cancer Father   . Melanoma Father   . Diabetes Mother   . Stroke Mother   . Hypertension Mother   . Fainting Paternal Grandfather   . Congestive Heart Failure Paternal Grandfather   . Atrial fibrillation Paternal Grandfather    Current Outpatient Medications  Medication Sig Dispense Refill  . acetaminophen (TYLENOL) 650 MG CR tablet Take 1,300 mg by mouth daily as needed for pain.    Marland Kitchen allopurinol (ZYLOPRIM) 300 MG tablet Take 300 mg by mouth daily.     Marland Kitchen atorvastatin (LIPITOR) 40 MG tablet Take 1 tablet (40 mg total) by mouth daily at 6 PM. 30 tablet 6  . diclofenac sodium (VOLTAREN) 1 % GEL Apply 1 application topically 2 (two) times daily as needed (leg pain).    Marland Kitchen docusate sodium (COLACE) 100 MG capsule Take 100 mg by mouth daily as needed for mild constipation.     . fesoterodine (TOVIAZ) 8 MG TB24 tablet Take 8 mg by mouth daily with supper.    . finasteride (PROSCAR) 5 MG tablet Take 5 mg by mouth daily.    Marland Kitchen glimepiride (AMARYL) 2 MG tablet Take 2 mg by mouth daily.     Marland Kitchen levocetirizine (XYZAL) 5 MG tablet Take 5 mg by mouth every evening.     Marland Kitchen LORazepam (ATIVAN) 0.5 MG tablet Take 0.5 mg by mouth 2 (two) times daily as needed for anxiety.    . meclizine (ANTIVERT) 25 MG tablet Take 25 mg by mouth 3 (three) times daily as needed for dizziness.   0  . metFORMIN (GLUCOPHAGE) 1000 MG tablet Take 1,000 mg by mouth 2 (two) times daily with a meal.  1  . midodrine (PROAMATINE) 10 MG tablet Take 10 mg by mouth 3 (three) times daily with meals.    . Multiple Vitamin (MULTIVITAMIN WITH MINERALS) TABS tablet Take 1 tablet by mouth daily with breakfast.     . pantoprazole (PROTONIX) 40 MG tablet Take 40 mg by mouth 2 (two) times daily with a meal.     . potassium chloride SA (K-DUR,KLOR-CON) 20 MEQ tablet Take 1 tablet (20 mEq total) by mouth daily. 30 tablet 3  . Propylene Glycol (SYSTANE BALANCE OP) Place 1 drop into both eyes 2 (two) times daily as  needed (dry eyes).    . sertraline (ZOLOFT) 100 MG tablet Take 100 mg by mouth daily with breakfast.  1  . tiZANidine (ZANAFLEX) 2 MG tablet Take 1 mg by mouth every 6 (six) hours as needed for muscle spasms.     Marland Kitchen torsemide (DEMADEX) 20 MG tablet Take 4 tablets (80 mg total) by mouth daily. (Patient taking differently: Take  40 mg by mouth 2 (two) times daily. ) 120 tablet 3  . triamcinolone cream (KENALOG) 0.1 % Apply 1 application topically See admin instructions. Apply to arms daily     No current facility-administered medications for this encounter.    Vitals:   01/03/18 1348  BP: 122/80  Pulse: 80  SpO2: 98%  Weight: 79.3 kg (174 lb 12.8 oz)   Wt Readings from Last 3 Encounters:  01/03/18 79.3 kg (174 lb 12.8 oz)  12/22/17 77.3 kg (170 lb 6.7 oz)  11/14/17 80.3 kg (177 lb)   Physical Exam General: Well appearing. No resp difficulty. HEENT: Normal Neck: Supple. JVP 5-6. Carotids 2+ bilat; no bruits. No thyromegaly or nodule noted. Cor: PMI nondisplaced. RRR, No M/G/R noted Lungs: CTAB, normal effort. Abdomen: Soft, non-tender, non-distended, no HSM. No bruits or masses. +BS  Extremities: No cyanosis, clubbing, or rash. R and LLE no edema.  Neuro: Alert & orientedx3, cranial nerves grossly intact. moves all 4 extremities w/o difficulty. Affect pleasant   Assessment/Plan:  1. Pulmonary hypertension with RV failure/cor pulmonale:  - Suspect that the pulmonary hypertension is a combination of group 2 (diastolic CHF) and primarily group 3 PH (OHS/OSA).  He is on high flow oxygen at all times but cannot tolerate CPAP.  High resolution CT chest was not suggestive of ILD.  V/Q scan did not show chronic PEs in 2016.  - Volume status stable on exam.  - Continue high flow oxygen - He is off sildenafil. (His mother stopped it, and has not been resumed) - Continue torsemide 80 mg daily.  - He continues to require midodrine 10 mg TID to keep BP up.  - Unable to perform PFTs.  -  Followup with pulmonary.  2. Chronic diastolic CHF:  - In addition to RV failure, RHC showed elevated PCWP.  - Echo 09/28/2017 EF 60-65% on echo.   - NYHA II-III symptoms.  - Volume status stable.  - Continue torsemide as above.  - Reinforced fluid restriction to < 2 L daily, sodium restriction to less than 2000 mg daily, and the importance of daily weights.   3. CKD: Stage 3 - BMET today.   4. GI bleeding: Capsule negative in 6/19.  1 episode of maroon stool since discharge.  - CBC stable 12/23/17  - Follows with GI.  5. Cholelithiasis:  - Abdomen stable.  - Admitted 12/21/17 with RUQ pain and N/V. ABD US showed cholelithiasis without evidence for cholecystitis. He improved with conservative therapy, and was also thought to overall be a very poor surgical candidate, possibly prohibitive by both HF and surgical teams. At this point, would avoid cholecystectomy as he would be at high risk for complications.  Would only operate if absolutely necessary.  - Will try on ursodiol 400 mg BID (discussed dosing with PharmD).  6. Nephrolithiasis: - Tolerate urological procedure with Propofol well.  7. 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  - Continue atorvastatin 40 mg daily.  - Hold off on ASA 81 with recent GI bleeding.   Meds and labs as above. RTC 3 months.   Shirley Friar, PA-C  01/03/2018   Patient sene with PA, agree with the above note.  He has been doing well recently.  Weight down another 3 lbs.  Per his mother, he is walking more and oxygen is down to 5L [place].  His mother does not want him to take sildenafil, so he is now off of this medication. Volume status looks ok today.  -  Continue current torsemide 40 mg bid.  - Continue midodrine to maintain BP.  - As PH appears to be group 2 and group 3 (unlikely group 1), think it is reasonable to hold off on selective pulmonary vasodilators.  Treatment will be oxygen and diuresis (he cannot tolerate CPAP).  - BMET today.   He was recently  seen in the hospital with symptomatic cholelithiasis. He is high risk for surgery, would not electively remove gallbladder.   Followup in 3 months.   Loralie Champagne 01/04/2018  - I will start him on ursodiol 400 mg bid.

## 2018-01-03 NOTE — Patient Instructions (Addendum)
Start Ursodiol 300 mg Twice daily   Labs today  Your physician recommends that you schedule a follow-up appointment in: 3 months

## 2018-01-03 NOTE — Progress Notes (Signed)
PCP: Angelica Pou, MD CHF CARDIOLOGIST: Dr. Aundra Dubin  Clinic Note: Chief Complaint  Patient presents with  . Follow-up    Doing well  . Congestive Heart Failure    Chronic RV Failure with Severe Pulm HTN  . Shortness of Breath    Chronic Hypoxic Resp Failure    HPI: Tony Jones is a 57 y.o. male with a PMH (notable for congenital developmental delay, CKD-3, chronic diastolic heart failure with more prominent RV failure and severe pulmonary hypertension, OHS/OSA with chronic hypoxic respiratory failure) who presents today for 5-monthfollow-up after prolonged hospitalization in May - Dx w/ Severe Pulm HTN & RV Failure... He is the son of a former patient of mine. He has long-standing congenital mental retardation & lives with his mother. He Is accompanied by his mother.. He has a relatively significant amount of anxiety and is not good with procedural based tests.  Tony Jones has chronic hypoxia for which I referred him to pulmonary medicine in 2016. He would hours daytime oxygen 24/7. He has had oxygen levels as low as 50 to 60% on O2 sat. --Unfortunately as result of long-standing hypoxia possibly related to his congenital abnormalities, he has developed significant right-sided heart failure and has been referred over to CHF clinic being followed by Dr. DLoralie Champagne JBuford Dresserwas last seen on August 29, 2017 --he was noted to have pretty low blood pressure in the 90/60 range.  I switched him from carvedilol to Toprol for better rate control and recommended hydration.  Echo ordered.  Recent Hospitalizations:   Hospitalized May 31-October 13, 2017: Noted to have RV failure with (group 3 PAH) pulmonary hypertension and chronic hypoxic respiratory failure.  Overall symptoms are complicated by cholelithiasis and abdominal pain likely related to hepatic vein venous congestion from chronic right-sided heart failure.  He actually had a right heart cath showing severely elevated PA pressures  with preserved PA PI but elevated RA pressures and pulmonary bladder wedge pressure. -->  Encouraged him to try CPAP, using nasal pillows (unfortunately, he does not tolerate this and is only using nighttime oxygen).  Was discharged on torsemide and initially planned sildenafil, but this was discontinued due to concerns for untoward side effect.  Continuous oxygen.  And midodrine to help blood pressure.  August 21-23 2019: Right upper quadrant pain with renal insufficiency --> was thought to be related to cholelithiasis.  He was thought to be a high risk patient for any surgery and therefore was not felt to be a good surgical candidate.  Studies Personally Reviewed - (if available, images/films reviewed: From Epic Chart or Care Everywhere)  2D Echo May 29 219: Mild concentric LVH.  EF 60 to 65%.  GR 1 DD, but high filling pressures.  IVS had diastolic and systolic flattening consistent with volume and pressure overload.  PA pressures estimated 75 mmHg.  R&LHC 10/05/17: (Dr. MAundra Dubin: Non-obstructive CAD (50% ostPDA); Severe pulmonary arterial hypertension.  Elevated left and right heart filling pressures - R>L. RAP 16 mmHg, RVP 92/16 mmHg; PAP 93/51 mmHg mean 67, PCWP mean 27 mmHg. LVP 110/21 - EDP 21 mmHg. AoP 115/75 mmHg.  PaO2 56%,  AoO2 99% -> Cardiac Output -Index (Fick) 4.95 - 2.65; PVR 8 WU. PAPi 2.65  Interval History: [name] here with his mom actually doing quite well.  He is quite happy that his oxygen levels have gone down.  He actually just saw Dr. MAundra Dubinearlier today -only notable change was adding ursodiol additional  change also was made since last visit where he is now taking Toviaz for pulmonary pretension along with standing dose of Demadex.Tony Jones actually is doing fairly well.  He is now walking some at the gym, and is going to day camp several days out of the week which allows him to get out of the house and gives his mother a break.  His breathing is notably improved as has his  abdominal pain, and his edema is all but gone. He still has his apnea spells and is now on chronic oxygen, but notices that with this his dyspnea is definitely better.  It is hard to tell if he has any PND orthopnea, but it does not seem like it based on discussion with his mother. He he is not complaining of any chest pain or pressure with rest or exertion.  No complaints of rapid irregular heartbeats or palpitations.  No syncope or near syncope.  No TIA or amaurosis fugax.  No claudication.  ROS: A comprehensive was performed. Review of Systems  Constitutional: Negative for fever and malaise/fatigue.  HENT: Negative for nosebleeds.   Eyes: Negative for double vision.  Respiratory: Negative for cough and shortness of breath.   Cardiovascular: Negative for claudication.  Gastrointestinal: Positive for abdominal pain (He still has intermittent right upper quadrant pain) and nausea (Off and on). Negative for blood in stool, constipation and melena.  Genitourinary: Negative for hematuria.  Musculoskeletal: Negative.        No MSK complaints  Psychiatric/Behavioral: The patient is nervous/anxious.        Developmentally still a young child.  I am not sure how much of was going on he actually conceptualized.  All other systems reviewed and are negative.   I have reviewed and (if needed) personally updated the patient's problem list, medications, allergies, past medical and surgical history, social and family history.   Past Medical History:  Diagnosis Date  . Anemia   . Anxiety   . Barrett esophagus   . Cholelithiasis   . Chronic respiratory failure with hypoxia ([place])   . CKD (chronic kidney disease) stage 3, GFR 30-59 ml/min (HCC)   . Developmental delay   . Diabetes mellitus type 2, uncontrolled ([place])   . Dyslipidemia   . GERD (gastroesophageal reflux disease)   . Hypertension   . Mental disorder   . Nephrolithiasis   . Pulmonary hypertension ([place])    Likely related to  long-standing hypoxia and possible OSA/OHS (group 3) also noted to have some component of group 1)  . Right heart failure [facility])     Past Surgical History:  Procedure Laterality Date  . CYSTOSCOPY/URETEROSCOPY/HOLMIUM LASER/STENT PLACEMENT Right 11/14/2017   Procedure: CYSTOSCOPY/URETEROSCOPY/RETROGRADE/STENT PLACEMENT;  Surgeon: [name], MD;  Location: [facility];  Service: Urology;  Laterality: Right;  75 MINS  SPINAL OR MAC ANESTHESIA  ALSO EXTRACTION OF STONE  . ESOPHAGOGASTRODUODENOSCOPY N/A 08/02/2017   Procedure: ESOPHAGOGASTRODUODENOSCOPY (EGD);  Surgeon: [name], MD;  Location: [facility];  Service: Endoscopy;  Laterality: N/A;  . GIVENS CAPSULE STUDY N/A 10/12/2017   Procedure: GIVENS CAPSULE STUDY;  Surgeon: [name], MD;  Location: [facility];  Service: Endoscopy;  Laterality: N/A;  . 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  Elevated left and right heart filling pressures - R>L. RAP 16 mmHg, RVP 92/16 mmHg; PAP 93/51 mmHg mean  67, PCWP mn27 mmHg. LVP 110/21 - EDP 21 mmHg. AoP 115/75 mmHg.  PaO2 56%, AoO2 99%. CO/I (Fick) 4.95-2.65, PVR 8 WU, PAPi 2.65  . THROAT SURGERY    . TRANSTHORACIC ECHOCARDIOGRAM  01/2015   Mild concentric LVH.  EF 60 to 65%.  GR 1 DD, but high filling pressures.  IVS had diastolic and systolic flattening consistent with volume and pressure overload.  PA pressures estimated 75 mmHg.    Current Meds  Medication Sig  . acetaminophen (TYLENOL) 650 MG CR tablet Take 1,300 mg by mouth daily as needed for pain.  Marland Kitchen allopurinol (ZYLOPRIM) 300 MG tablet Take 300 mg by mouth daily.   Marland Kitchen atorvastatin (LIPITOR) 40 MG tablet Take 1 tablet (40 mg total) by mouth daily at 6 PM.  . diclofenac sodium (VOLTAREN) 1 % GEL Apply 1 application topically 2 (two) times daily as  needed (leg pain).  Marland Kitchen docusate sodium (COLACE) 100 MG capsule Take 100 mg by mouth daily as needed for mild constipation.   . fesoterodine (TOVIAZ) 8 MG TB24 tablet Take 8 mg by mouth daily with supper.  . finasteride (PROSCAR) 5 MG tablet Take 5 mg by mouth daily.  Marland Kitchen glimepiride (AMARYL) 2 MG tablet Take 2 mg by mouth daily.   Marland Kitchen levocetirizine (XYZAL) 5 MG tablet Take 5 mg by mouth every evening.   Marland Kitchen LORazepam (ATIVAN) 0.5 MG tablet Take 0.5 mg by mouth 2 (two) times daily as needed for anxiety.  . meclizine (ANTIVERT) 25 MG tablet Take 25 mg by mouth 3 (three) times daily as needed for dizziness.   . metFORMIN (GLUCOPHAGE) 1000 MG tablet Take 1,000 mg by mouth 2 (two) times daily with a meal.  . midodrine (PROAMATINE) 10 MG tablet Take 10 mg by mouth 3 (three) times daily with meals.  . Multiple Vitamin (MULTIVITAMIN WITH MINERALS) TABS tablet Take 1 tablet by mouth daily with breakfast.   . pantoprazole (PROTONIX) 40 MG tablet Take 40 mg by mouth 2 (two) times daily with a meal.   . potassium chloride SA (K-DUR,KLOR-CON) 20 MEQ tablet Take 1 tablet (20 mEq total) by mouth daily.  Marland Kitchen Propylene Glycol (SYSTANE BALANCE OP) Place 1 drop into both eyes 2 (two) times daily as needed (dry eyes).  . sertraline (ZOLOFT) 100 MG tablet Take 100 mg by mouth daily with breakfast.  . tiZANidine (ZANAFLEX) 2 MG tablet Take 1 mg by mouth every 6 (six) hours as needed for muscle spasms.   Marland Kitchen torsemide (DEMADEX) 20 MG tablet Take 4 tablets (80 mg total) by mouth daily. (Patient taking differently: Take 40 mg by mouth 2 (two) times daily. )  . triamcinolone cream (KENALOG) 0.1 % Apply 1 application topically See admin instructions. Apply to arms daily  . ursodiol (ACTIGALL) 300 MG capsule Take 1 capsule (300 mg total) by mouth 2 (two) times daily.    Allergies  Allergen Reactions  . Codeine Nausea And Vomiting    Social History   Tobacco Use  . Smoking status: Never Smoker  . Smokeless tobacco: Never  Used  Substance Use Topics  . Alcohol use: No  . Drug use: No   Social History   Social History Narrative  . Not on file    family history includes Atrial fibrillation in his father and paternal grandfather; Brain cancer in his father; Congestive Heart Failure in his father and paternal grandfather; Diabetes in his mother; Fainting in his paternal grandfather; Heart attack in his father; Heart disease in his  father; Hypertension in his mother; Melanoma in his father; Stroke in his mother.  Wt Readings from Last 3 Encounters:  01/03/18 174 lb 12.8 oz (79.3 kg)  01/03/18 174 lb 12.8 oz (79.3 kg)  12/22/17 170 lb 6.7 oz (77.3 kg)    PHYSICAL EXAM BP 121/77   Pulse 85   Ht _0  (1.549 m)   Wt 174 lb 12.8 oz (79.3 kg)   SpO2 98%   BMI 33.03 kg/m  Physical Exam   Adult ECG Report   Other studies Reviewed: Additional studies/ records that were reviewed today include:  Recent Labs:   Lab Results  Component Value Date   CREATININE 1.61 (H) 01/03/2018   BUN 23 (H) 01/03/2018   NA 139 01/03/2018   K 4.1 01/03/2018   CL 93 (L) 01/03/2018   CO2 36 (H) 01/03/2018    ASSESSMENT / PLAN: Problem List Items Addressed This Visit    Chronic diastolic heart failure (HCC) (Chronic)    I am not very much convinced that this is chronic diastolic heart failure.  Probably more all RV failure from chronic hypoxia.  On standing dose of diuretic.  But with hypotension he is actually on Midrin to help bring his blood pressures up.      Failure to thrive in adult    He actually seems to be doing much better now.  He does have the cholelithiasis which causes intermittent problems that actually complicate his congestive hepatopathy issues.  He certainly is much more symptomatic than they have been in the past, but is relatively comfortable now.  Unfortunately our treatment options are limited.      Postural dizziness    Actually on midodrine to help with hypotension related dizziness       Pulmonary hypertension (HCC) (Chronic)    Seemingly well controlled right now on Toviaz and oxygen.  Managed by Dr.McLean --on standing dose of diuretic and doing well.  No signs of volume overload.      Right heart failure (HCC) (Chronic)    Likely cor pulmonale from chronic hypoxia.  Difficult to carry on how he actually diurese him and avoid worsening hypotension.  Currently he is on stable dose of diuretic and maintaining a reasonable left blood pressure that he does not pass out.    On home oxygen and Toviaz along with diuretic.         I spent a total of 20 minutes with the patient and chart review. >  50% of the time was spent in direct patient consultation.   Current medicines are reviewed at length with the patient today.  (+/- concerns) no change The following changes have been made:  no change  Patient Instructions  No changes with current  Medications     Your physician wants you to follow-up in 6 month with DR Jillian Pianka. You will receive a reminder letter in the mail two months in advance. If you don't receive a letter, please call our office to schedule the follow-up appointment.  If you need a refill on your cardiac medications before your next appointment, please call your pharmacy.     Studies Ordered:   No orders of the defined types were placed in this encounter.     Glenetta Hew, M.D., M.S. Interventional Cardiologist   Pager # [phone] Phone # [phone] [address] [place], [place] [address]   Thank you for choosing [facility] at [facility]!!

## 2018-01-03 NOTE — Patient Instructions (Signed)
No changes with current  Medications     Your physician wants you to follow-up in 6 month with DR HARDING. You will receive a reminder letter in the mail two months in advance. If you don't receive a letter, please call our office to schedule the follow-up appointment.  If you need a refill on your cardiac medications before your next appointment, please call your pharmacy.

## 2018-01-04 ENCOUNTER — Encounter: Payer: Self-pay | Admitting: Cardiology

## 2018-01-04 NOTE — Assessment & Plan Note (Signed)
Actually on midodrine to help with hypotension related dizziness

## 2018-01-04 NOTE — Assessment & Plan Note (Signed)
I am not very much convinced that this is chronic diastolic heart failure.  Probably more all RV failure from chronic hypoxia.  On standing dose of diuretic.  But with hypotension he is actually on Midrin to help bring his blood pressures up.

## 2018-01-04 NOTE — Assessment & Plan Note (Signed)
Likely cor pulmonale from chronic hypoxia.  Difficult to carry on how he actually diurese him and avoid worsening hypotension.  Currently he is on stable dose of diuretic and maintaining a reasonable left blood pressure that he does not pass out.    On home oxygen and Toviaz along with diuretic.

## 2018-01-04 NOTE — Assessment & Plan Note (Signed)
He actually seems to be doing much better now.  He does have the cholelithiasis which causes intermittent problems that actually complicate his congestive hepatopathy issues.  He certainly is much more symptomatic than they have been in the past, but is relatively comfortable now.  Unfortunately our treatment options are limited.

## 2018-01-04 NOTE — Assessment & Plan Note (Signed)
Seemingly well controlled right now on Toviaz and oxygen.  Managed by Dr.McLean --on standing dose of diuretic and doing well.  No signs of volume overload.

## 2018-04-04 ENCOUNTER — Ambulatory Visit (HOSPITAL_COMMUNITY)
Admission: RE | Admit: 2018-04-04 | Discharge: 2018-04-04 | Disposition: A | Discharge status: Home / Self Care | Payer: No Typology Code available for payment source | Source: Ambulatory Visit | Attending: Cardiology | Admitting: Cardiology

## 2018-04-04 VITALS — BP 150/90 | HR 110 | Wt 173.4 lb

## 2018-04-04 DIAGNOSIS — K8020 Calculus of gallbladder without cholecystitis without obstruction: Secondary | ICD-10-CM | POA: Insufficient documentation

## 2018-04-04 DIAGNOSIS — Z7984 Long term (current) use of oral hypoglycemic drugs: Secondary | ICD-10-CM | POA: Insufficient documentation

## 2018-04-04 DIAGNOSIS — I130 Hypertensive heart and chronic kidney disease with heart failure and stage 1 through stage 4 chronic kidney disease, or unspecified chronic kidney disease: Secondary | ICD-10-CM | POA: Diagnosis not present

## 2018-04-04 DIAGNOSIS — Z8249 Family history of ischemic heart disease and other diseases of the circulatory system: Secondary | ICD-10-CM | POA: Insufficient documentation

## 2018-04-04 DIAGNOSIS — I5032 Chronic diastolic (congestive) heart failure: Secondary | ICD-10-CM | POA: Diagnosis not present

## 2018-04-04 DIAGNOSIS — N200 Calculus of kidney: Secondary | ICD-10-CM | POA: Insufficient documentation

## 2018-04-04 DIAGNOSIS — Z833 Family history of diabetes mellitus: Secondary | ICD-10-CM | POA: Diagnosis not present

## 2018-04-04 DIAGNOSIS — I2510 Atherosclerotic heart disease of native coronary artery without angina pectoris: Secondary | ICD-10-CM | POA: Diagnosis not present

## 2018-04-04 DIAGNOSIS — I272 Pulmonary hypertension, unspecified: Secondary | ICD-10-CM | POA: Diagnosis present

## 2018-04-04 DIAGNOSIS — E1122 Type 2 diabetes mellitus with diabetic chronic kidney disease: Secondary | ICD-10-CM | POA: Diagnosis not present

## 2018-04-04 DIAGNOSIS — Z79899 Other long term (current) drug therapy: Secondary | ICD-10-CM | POA: Diagnosis not present

## 2018-04-04 DIAGNOSIS — K922 Gastrointestinal hemorrhage, unspecified: Secondary | ICD-10-CM | POA: Insufficient documentation

## 2018-04-04 DIAGNOSIS — E785 Hyperlipidemia, unspecified: Secondary | ICD-10-CM | POA: Diagnosis not present

## 2018-04-04 DIAGNOSIS — G4733 Obstructive sleep apnea (adult) (pediatric): Secondary | ICD-10-CM | POA: Diagnosis not present

## 2018-04-04 DIAGNOSIS — I50812 Chronic right heart failure: Secondary | ICD-10-CM | POA: Diagnosis not present

## 2018-04-04 DIAGNOSIS — N183 Chronic kidney disease, stage 3 (moderate): Secondary | ICD-10-CM | POA: Insufficient documentation

## 2018-04-04 LAB — COMPREHENSIVE METABOLIC PANEL
ALK PHOS: 93 U/L (ref 38–126)
ALT: 14 U/L (ref 0–44)
AST: 22 U/L (ref 15–41)
Albumin: 4 g/dL (ref 3.5–5.0)
Anion gap: 13 (ref 5–15)
BILIRUBIN TOTAL: 0.5 mg/dL (ref 0.3–1.2)
BUN: 34 mg/dL — AB (ref 6–20)
CALCIUM: 9.4 mg/dL (ref 8.9–10.3)
CHLORIDE: 95 mmol/L — AB (ref 98–111)
CO2: 29 mmol/L (ref 22–32)
CREATININE: 1.96 mg/dL — AB (ref 0.61–1.24)
GFR, EST AFRICAN AMERICAN: 43 mL/min — AB (ref >60)
GFR, EST NON AFRICAN AMERICAN: 37 mL/min — AB (ref >60)
Glucose, Bld: 154 mg/dL — ABNORMAL HIGH (ref 70–99)
Potassium: 4.1 mmol/L (ref 3.5–5.1)
Sodium: 137 mmol/L (ref 135–145)
Total Protein: 7.6 g/dL (ref 6.5–8.1)

## 2018-04-04 LAB — CBC
HEMATOCRIT: 36.3 % — AB (ref 39.0–52.0)
Hemoglobin: 12.3 g/dL — ABNORMAL LOW (ref 13.0–17.0)
MCH: 31.2 pg (ref 26.0–34.0)
MCHC: 33.9 g/dL (ref 30.0–36.0)
MCV: 92.1 fL (ref 80.0–100.0)
PLATELETS: 168 10*3/uL (ref 150–400)
RBC: 3.94 MIL/uL — ABNORMAL LOW (ref 4.22–5.81)
RDW: 14.9 % (ref 11.5–15.5)
WBC: 9.3 10*3/uL (ref 4.0–10.5)
nRBC: 0 % (ref 0.0–0.2)

## 2018-04-04 NOTE — Patient Instructions (Signed)
Labs today We will only contact you if something comes back abnormal or we need to make some changes. Otherwise no news is good news!  Your physician recommends that you schedule a follow-up appointment in: 3 months with Dr. McLean   

## 2018-04-05 NOTE — Progress Notes (Signed)
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  Patient was admitted in 6/19 with acute on chronic diastolic CHF with prominent RV failure.  RHC showed elevated right and left heart filling pressures with severe pulmonary arterial hypertension.  Patient is on high flow oxygen for OHS, he cannot tolerate CPAP. He was diuresed extensively.  He had upper GI bleeding.  Capsule study was done in the hospital but was unrevealing.    He lives with his mother who takes care of him and helps with ADLs.  Admitted 12/21/17 with RUQ pain and N/V. ABD US showed cholelithiasis without evidence for cholecystitis. He improved with conservative therapy, and was also thought to overall be a very poor surgical candidate, possibly prohibitive by both HF and surgical teams.   He presents today for followup of diastolic CHF.  He still has occasional RUQ pain after a fatty meal, but he has had no prolonged pain and it is not frequent.  Breathing is stable, uses home oxygen and is able to walk without significant dyspnea on flat ground generally.  Weight is down 1 lb.  No lightheadedness or falls.  No BRBPR/melena.  No chest pain.  [facility]   Labs (6/19): K 3.6, creatinine 1.5 Labs (9/19): K 4.1, creatinine 1.61  PMH: 1. Developmental delay.  2. CKD: Stage 3.  3. Barretts esophagus with GERD 4. Cholelithiasis 5. Nephrolithiasis 6. OHS/OSA: Wears high flow oxygen at all times, cannot tolerate CPAP.  7. Type II diabetes.  8. HTN 9. Hyperlipidemia 10. Upper GI bleeding: Capsule study in 6/19 was negative.  11. Coronary disease: Nonobstructive.  - LHC (6/19) with 50% ostial PDA, otherwise luminal irregularities.  12. Fe deficiency anemia: Due to GI blood  losses.  13. 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   - RHC (6/19): mean RA 16, PA 93/51 mean 67, mean 27, CI 2.65, PVR 8 WU - V/Q scan (2016): No evidence for chronic PE.  - High resolution chest CT (6/19): no definite ILD.  - Echo (5/19): EF 60-65%, mild LVH, septal flattening with RV failure, PASP 75 mmhg.  14. Chronic diastolic CHF 15. ABIs (9/17): normal 16. Cholelithiasis  Review of systems complete and found to be negative unless listed in HPI.    Social History   Socioeconomic History  . Marital status: Single    Spouse name: Not on file  . Number of children: Not on file  . Years of education: Not on file  . Highest education level: Not on file  Occupational History  . Occupation: unemployed  Social Needs  . Financial resource strain: Not on file  . Food insecurity:    Worry: Not on file    Inability: Not on file  . Transportation needs:    Medical: Not on file    Non-medical: Not on file  Tobacco Use  . Smoking status: Never Smoker  . Smokeless tobacco: Never Used  Substance and Sexual Activity  . Alcohol use: No  . Drug use: No  . Sexual activity: Never  Lifestyle  . Physical activity:    Days per week: Not on file    Minutes per session: Not on file  . Stress: Not on file  Relationships  .  Social connections:    Talks on phone: Not on file    Gets together: Not on file    Attends religious service: Not on file    Active member of club or organization: Not on file    Attends meetings of clubs or organizations: Not on file    Relationship status: Not on file  . Intimate partner violence:    Fear of current or ex partner: Not on file    Emotionally abused: Not on file    Physically abused: Not on file    Forced sexual activity: Not on file  Other Topics Concern  . Not on file  Social History Narrative  . Not on file   Family History  Problem Relation Age of Onset  . Congestive Heart Failure Father     . Heart disease Father   . Heart attack Father   . Atrial fibrillation Father   . Brain cancer Father   . Melanoma Father   . Diabetes Mother   . Stroke Mother   . Hypertension Mother   . Fainting Paternal Grandfather   . Congestive Heart Failure Paternal Grandfather   . Atrial fibrillation Paternal Grandfather    Current Outpatient Medications  Medication Sig Dispense Refill  . acetaminophen (TYLENOL) 650 MG CR tablet Take 1,300 mg by mouth daily as needed for pain.    Marland Kitchen allopurinol (ZYLOPRIM) 300 MG tablet Take 300 mg by mouth daily.     Marland Kitchen atorvastatin (LIPITOR) 40 MG tablet Take 1 tablet (40 mg total) by mouth daily at 6 PM. 30 tablet 6  . diclofenac sodium (VOLTAREN) 1 % GEL Apply 1 application topically 2 (two) times daily as needed (leg pain).    Marland Kitchen docusate sodium (COLACE) 100 MG capsule Take 100 mg by mouth daily as needed for mild constipation.     Marland Kitchen levocetirizine (XYZAL) 5 MG tablet Take 5 mg by mouth every evening.     . metFORMIN (GLUCOPHAGE) 1000 MG tablet Take 1,000 mg by mouth 2 (two) times daily with a meal.  1  . midodrine (PROAMATINE) 10 MG tablet Take 10 mg by mouth 3 (three) times daily with meals.    . Multiple Vitamin (MULTIVITAMIN WITH MINERALS) TABS tablet Take 1 tablet by mouth daily with breakfast.     . pantoprazole (PROTONIX) 40 MG tablet Take 40 mg by mouth 2 (two) times daily with a meal.     . potassium chloride SA (K-DUR,KLOR-CON) 20 MEQ tablet Take 1 tablet (20 mEq total) by mouth daily. 30 tablet 3  . sertraline (ZOLOFT) 100 MG tablet Take 100 mg by mouth daily with breakfast.  1  . torsemide (DEMADEX) 20 MG tablet Take 4 tablets (80 mg total) by mouth daily. (Patient taking differently: Take 20 mg by mouth 2 (two) times daily. ) 120 tablet 3  . triamcinolone cream (KENALOG) 0.1 % Apply 1 application topically See admin instructions. Apply to arms daily    . Propylene Glycol (SYSTANE BALANCE OP) Place 1 drop into both eyes 2 (two) times daily as needed  (dry eyes).     No current facility-administered medications for this encounter.    Vitals:   04/04/18 1435  BP: (!) 150/90  Pulse: (!) 110  SpO2: 97%  Weight: 78.7 kg (173 lb 6.4 oz)   Wt Readings from Last 3 Encounters:  04/04/18 78.7 kg (173 lb 6.4 oz)  01/03/18 79.3 kg (174 lb 12.8 oz)  01/03/18 79.3 kg (174 lb 12.8 oz)  Physical Exam General: NAD Neck: Thick, no JVD, no thyromegaly or thyroid nodule.  Lungs: Clear to auscultation bilaterally with normal respiratory effort. CV: Nondisplaced PMI.  Heart regular S1/S2, no S3/S4, no murmur.  No peripheral edema.  No carotid bruit.  Normal pedal pulses.  Abdomen: Soft, nontender, no hepatosplenomegaly, no distention.  Skin: Intact without lesions or rashes.  Neurologic: Alert and oriented x 3.  Psych: Normal affect. Extremities: No clubbing or cyanosis.  HEENT: Normal.   Assessment/Plan:  1. Pulmonary hypertension with RV failure/cor pulmonale: Suspect that the pulmonary hypertension is a combination of group 2 (diastolic CHF) and primarily group 3 PH (OHS/OSA).  He is on high flow oxygen at all times but cannot tolerate CPAP.  High resolution CT chest was not suggestive of ILD.  V/Q scan did not show chronic PEs in 2016. Volume status stable on exam and weight is stable.  - Continue high flow oxygen - As PH appears to be group 2 and group 3 (unlikely group 1), think it is reasonable to hold off on selective pulmonary vasodilators.  Treatment will be oxygen and diuresis (he cannot tolerate CPAP).  - He continues to require midodrine 10 mg TID to keep BP up.  - Unable to perform PFTs.  2. Chronic diastolic CHF:  In addition to RV failure, last RHC showed elevated PCWP.  Echo 09/28/2017 EF 60-65% on echo.  NYHA II-III symptoms, stable to improved.  He is not volume overloaded on exam.  - Continue torsemide 20 mg bid. BMET today.  3. CKD: Stage 3 - BMET today.   4. GI bleeding: Capsule negative in 6/19.  No recent overt GI  bleeding.   - CBC today.   - Follows with GI.  5. Cholelithiasis:  Admitted 12/21/17 with RUQ pain and N/V. ABD US showed cholelithiasis without evidence for cholecystitis. He improved with conservative therapy, and was also thought to overall be a very poor surgical candidate, possibly prohibitive by both HF and surgical teams. At this point, would avoid cholecystectomy as he would be at high risk for complications.  Would only operate if absolutely necessary.  He has only rare gallbladder colic at this point, his mother will bring him to the ER or call if he has any prolonged RUQ pain. Cholecystostomy tube would be an option if needed.  - Continue ursodiol.  6. Nephrolithiasis: Tolerated urological procedure with Propofol well.  7. 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  - Continue atorvastatin 40 mg daily.  - Hold off on ASA 81 with GI bleeding history.   Followup in 3 months.    Loralie Champagne, MD  04/05/2018

## 2018-04-24 ENCOUNTER — Ambulatory Visit (HOSPITAL_COMMUNITY)
Admission: RE | Admit: 2018-04-24 | Discharge: 2018-04-24 | Disposition: A | Discharge status: Home / Self Care | Payer: No Typology Code available for payment source | Source: Ambulatory Visit | Attending: Internal Medicine | Admitting: Internal Medicine

## 2018-04-24 ENCOUNTER — Other Ambulatory Visit (HOSPITAL_COMMUNITY): Payer: Self-pay | Admitting: *Deleted

## 2018-04-24 DIAGNOSIS — I5022 Chronic systolic (congestive) heart failure: Secondary | ICD-10-CM

## 2018-04-24 LAB — BASIC METABOLIC PANEL
Anion gap: 9 (ref 5–15)
BUN: 33 mg/dL — AB (ref 6–20)
CHLORIDE: 98 mmol/L (ref 98–111)
CO2: 35 mmol/L — ABNORMAL HIGH (ref 22–32)
Calcium: 9.5 mg/dL (ref 8.9–10.3)
Creatinine, Ser: 1.74 mg/dL — ABNORMAL HIGH (ref 0.61–1.24)
GFR calc Af Amer: 50 mL/min — ABNORMAL LOW (ref >60)
GFR, EST NON AFRICAN AMERICAN: 43 mL/min — AB (ref >60)
GLUCOSE: 149 mg/dL — AB (ref 70–99)
POTASSIUM: 4.4 mmol/L (ref 3.5–5.1)
Sodium: 142 mmol/L (ref 135–145)

## 2018-07-03 ENCOUNTER — Telehealth (HOSPITAL_COMMUNITY): Payer: Self-pay | Admitting: *Deleted

## 2018-07-03 NOTE — Telephone Encounter (Signed)
Dr Jimmye Norman called w/update on pt.  She states he has been weaned off his O2 and only uses it occastionally at night.  His CBG have been running low she has d/c'd his Metformin.  He did have some renal insufficency so she did cut back this Torsemide to 20 mg BID and KCL to 20 meq daily.    Pt is sch to see Dr Aundra Dubin tomorrow (07/04/2018) and she wanted to make sure he was aware of these changes.  Med list updated, info sent to Dr Aundra Dubin.

## 2018-07-04 ENCOUNTER — Encounter (HOSPITAL_COMMUNITY): Payer: Self-pay | Admitting: Cardiology

## 2018-07-04 ENCOUNTER — Ambulatory Visit (HOSPITAL_COMMUNITY)
Admission: RE | Admit: 2018-07-04 | Discharge: 2018-07-04 | Disposition: A | Discharge status: Home / Self Care | Payer: No Typology Code available for payment source | Source: Ambulatory Visit | Attending: Cardiology | Admitting: Cardiology

## 2018-07-04 VITALS — BP 140/84 | HR 93 | Wt 179.0 lb

## 2018-07-04 DIAGNOSIS — K922 Gastrointestinal hemorrhage, unspecified: Secondary | ICD-10-CM | POA: Diagnosis not present

## 2018-07-04 DIAGNOSIS — Z79899 Other long term (current) drug therapy: Secondary | ICD-10-CM | POA: Insufficient documentation

## 2018-07-04 DIAGNOSIS — I130 Hypertensive heart and chronic kidney disease with heart failure and stage 1 through stage 4 chronic kidney disease, or unspecified chronic kidney disease: Secondary | ICD-10-CM | POA: Diagnosis not present

## 2018-07-04 DIAGNOSIS — I272 Pulmonary hypertension, unspecified: Secondary | ICD-10-CM | POA: Diagnosis not present

## 2018-07-04 DIAGNOSIS — Z833 Family history of diabetes mellitus: Secondary | ICD-10-CM | POA: Insufficient documentation

## 2018-07-04 DIAGNOSIS — I5032 Chronic diastolic (congestive) heart failure: Secondary | ICD-10-CM

## 2018-07-04 DIAGNOSIS — G4733 Obstructive sleep apnea (adult) (pediatric): Secondary | ICD-10-CM | POA: Diagnosis not present

## 2018-07-04 DIAGNOSIS — K2270 Barrett's esophagus without dysplasia: Secondary | ICD-10-CM | POA: Diagnosis not present

## 2018-07-04 DIAGNOSIS — N183 Chronic kidney disease, stage 3 (moderate): Secondary | ICD-10-CM | POA: Insufficient documentation

## 2018-07-04 DIAGNOSIS — Z8249 Family history of ischemic heart disease and other diseases of the circulatory system: Secondary | ICD-10-CM | POA: Insufficient documentation

## 2018-07-04 DIAGNOSIS — E785 Hyperlipidemia, unspecified: Secondary | ICD-10-CM | POA: Insufficient documentation

## 2018-07-04 DIAGNOSIS — K8020 Calculus of gallbladder without cholecystitis without obstruction: Secondary | ICD-10-CM | POA: Insufficient documentation

## 2018-07-04 DIAGNOSIS — I50812 Chronic right heart failure: Secondary | ICD-10-CM | POA: Diagnosis not present

## 2018-07-04 DIAGNOSIS — Z808 Family history of malignant neoplasm of other organs or systems: Secondary | ICD-10-CM | POA: Insufficient documentation

## 2018-07-04 DIAGNOSIS — K219 Gastro-esophageal reflux disease without esophagitis: Secondary | ICD-10-CM | POA: Diagnosis not present

## 2018-07-04 DIAGNOSIS — I2510 Atherosclerotic heart disease of native coronary artery without angina pectoris: Secondary | ICD-10-CM | POA: Insufficient documentation

## 2018-07-04 DIAGNOSIS — E1122 Type 2 diabetes mellitus with diabetic chronic kidney disease: Secondary | ICD-10-CM | POA: Diagnosis not present

## 2018-07-04 LAB — BASIC METABOLIC PANEL
Anion gap: 11 (ref 5–15)
BUN: 36 mg/dL — ABNORMAL HIGH (ref 6–20)
CALCIUM: 9.2 mg/dL (ref 8.9–10.3)
CO2: 29 mmol/L (ref 22–32)
CREATININE: 1.97 mg/dL — AB (ref 0.61–1.24)
Chloride: 97 mmol/L — ABNORMAL LOW (ref 98–111)
GFR calc Af Amer: 43 mL/min — ABNORMAL LOW (ref >60)
GFR calc non Af Amer: 37 mL/min — ABNORMAL LOW (ref >60)
Glucose, Bld: 231 mg/dL — ABNORMAL HIGH (ref 70–99)
Potassium: 3.8 mmol/L (ref 3.5–5.1)
SODIUM: 137 mmol/L (ref 135–145)

## 2018-07-04 LAB — LIPID PANEL
CHOLESTEROL: 175 mg/dL (ref 0–200)
HDL: 31 mg/dL — ABNORMAL LOW (ref >40)
LDL CALC: 79 mg/dL (ref 0–99)
TRIGLYCERIDES: 324 mg/dL — AB (ref <150)
Total CHOL/HDL Ratio: 5.6 RATIO
VLDL: 65 mg/dL — ABNORMAL HIGH (ref 0–40)

## 2018-07-04 MED ORDER — MIDODRINE HCL 10 MG PO TABS: 5.0000 mg | ORAL_TABLET | Freq: Three times a day (TID) | ORAL | Status: DC

## 2018-07-04 NOTE — Progress Notes (Signed)
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  Patient was admitted in 6/19 with acute on chronic diastolic CHF with prominent RV failure.  RHC showed elevated right and left heart filling pressures with severe pulmonary arterial hypertension.  Patient is on high flow oxygen for OHS, he cannot tolerate CPAP. He was diuresed extensively.  He had upper GI bleeding.  Capsule study was done in the hospital but was unrevealing.    He lives with his mother who takes care of him and helps with ADLs.  Admitted 12/21/17 with RUQ pain and N/V. ABD US showed cholelithiasis without evidence for cholecystitis. He improved with conservative therapy, and was also thought to overall be a very poor surgical candidate, possibly prohibitive by both HF and surgical teams.   He presents today for followup of diastolic CHF.  No recent RUQ pain and no longer taking ursodiol.  Torsemide has been decreased to 40 mg daily and weight has remained fairly stable.  Apparently, his oxygen saturation has been doing ok during the day and he has not been using oxygen during the day (measures saturation several times/day).  He still uses oxygen at night. No lightheadedness/dizziness. No chest pain.  No dyspnea walking short distances and with ADLs.   Labs (6/19): K 3.6, creatinine 1.5 Labs (9/19): K 4.1, creatinine 1.61  PMH: 1. Developmental delay.  2. CKD: Stage 3.  3. Barretts esophagus with GERD 4. Cholelithiasis 5. Nephrolithiasis 6. OHS/OSA: Wearing oxygen at night, cannot tolerate CPAP.  7. Type II diabetes.  8. HTN 9. Hyperlipidemia 10. Upper GI bleeding: Capsule study in 6/19 was negative.  11. Coronary disease: Nonobstructive.  - LHC (6/19) with 50% ostial  PDA, otherwise luminal irregularities.  12. Fe deficiency anemia: Due to GI blood losses.  13. 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   - RHC (6/19): mean RA 16, PA 93/51 mean 67, mean 27, CI 2.65, PVR 8 WU - V/Q scan (2016): No evidence for chronic PE.  - High resolution chest CT (6/19): no definite ILD.  - Echo (5/19): EF 60-65%, mild LVH, septal flattening with RV failure, PASP 75 mmhg.  14. Chronic diastolic CHF 15. ABIs (9/17): normal 16. Cholelithiasis  Review of systems complete and found to be negative unless listed in HPI.    Social History   Socioeconomic History  . Marital status: Single    Spouse name: Not on file  . Number of children: Not on file  . Years of education: Not on file  . Highest education level: Not on file  Occupational History  . Occupation: unemployed  Social Needs  . Financial resource strain: Not on file  . Food insecurity:    Worry: Not on file    Inability: Not on file  . Transportation needs:    Medical: Not on file    Non-medical: Not on file  Tobacco Use  . Smoking status: Never Smoker  . Smokeless tobacco: Never Used  Substance and Sexual Activity  . Alcohol use: No  . Drug use: No  . Sexual activity: Never  Lifestyle  . Physical activity:    Days per week: Not on file    Minutes per session: Not on file  . Stress:  Not on file  Relationships  . Social connections:    Talks on phone: Not on file    Gets together: Not on file    Attends religious service: Not on file    Active member of club or organization: Not on file    Attends meetings of clubs or organizations: Not on file    Relationship status: Not on file  . Intimate partner violence:    Fear of current or ex partner: Not on file    Emotionally abused: Not on file    Physically abused: Not on file    Forced sexual activity: Not on file  Other Topics Concern  . Not on file  Social History Narrative  . Not on file    Family History  Problem Relation Age of Onset  . Congestive Heart Failure Father   . Heart disease Father   . Heart attack Father   . Atrial fibrillation Father   . Brain cancer Father   . Melanoma Father   . Diabetes Mother   . Stroke Mother   . Hypertension Mother   . Fainting Paternal Grandfather   . Congestive Heart Failure Paternal Grandfather   . Atrial fibrillation Paternal Grandfather    Current Outpatient Medications  Medication Sig Dispense Refill  . acetaminophen (TYLENOL) 650 MG CR tablet Take 1,300 mg by mouth daily as needed for pain.    Marland Kitchen allopurinol (ZYLOPRIM) 300 MG tablet Take 300 mg by mouth daily.     Marland Kitchen atorvastatin (LIPITOR) 40 MG tablet Take 1 tablet (40 mg total) by mouth daily at 6 PM. 30 tablet 6  . citalopram (CELEXA) 20 MG tablet Take 20 mg by mouth daily.    . diclofenac sodium (VOLTAREN) 1 % GEL Apply 1 application topically 2 (two) times daily as needed (leg pain).    Marland Kitchen docusate sodium (COLACE) 100 MG capsule Take 100 mg by mouth daily as needed for mild constipation.     Marland Kitchen levocetirizine (XYZAL) 5 MG tablet Take 5 mg by mouth every evening.     . midodrine (PROAMATINE) 10 MG tablet Take 0.5 tablets (5 mg total) by mouth 3 (three) times daily with meals.    . Multiple Vitamin (MULTIVITAMIN WITH MINERALS) TABS tablet Take 1 tablet by mouth daily with breakfast.     . pantoprazole (PROTONIX) 40 MG tablet Take 40 mg by mouth 2 (two) times daily with a meal.     . potassium chloride SA (K-DUR,KLOR-CON) 20 MEQ tablet Take 1 tablet (20 mEq total) by mouth daily. 30 tablet 3  . Propylene Glycol (SYSTANE BALANCE OP) Place 1 drop into both eyes 2 (two) times daily as needed (dry eyes).    . torsemide (DEMADEX) 20 MG tablet Take 20 mg by mouth 2 (two) times daily.    Marland Kitchen triamcinolone cream (KENALOG) 0.1 % Apply 1 application topically See admin instructions. Apply to arms daily     No current facility-administered medications for this encounter.    Vitals:    07/04/18 0834  BP: 140/84  Pulse: 93  SpO2: 93%  Weight: 81.2 kg (179 lb)   Wt Readings from Last 3 Encounters:  07/04/18 81.2 kg (179 lb)  04/04/18 78.7 kg (173 lb 6.4 oz)  01/03/18 79.3 kg (174 lb 12.8 oz)   Physical Exam General: NAD Neck: Thick, no JVD, no thyromegaly or thyroid nodule.  Lungs: Clear to auscultation bilaterally with normal respiratory effort. CV: Nondisplaced PMI.  Heart regular S1/S2, no S3/S4, 1/6 SEM  RUSB.  No peripheral edema.  No carotid bruit.  Normal pedal pulses.  Abdomen: Soft, nontender, no hepatosplenomegaly, no distention.  Skin: Intact without lesions or rashes.  Neurologic: Alert and oriented x 3.  Psych: Normal affect. Extremities: No clubbing or cyanosis.  HEENT: Normal.   Assessment/Plan:  1. Pulmonary hypertension with RV failure/cor pulmonale: Suspect that the pulmonary hypertension is a combination of group 2 (diastolic CHF) and primarily group 3 PH (OHS/OSA).  He is on high flow oxygen at all times but cannot tolerate CPAP.  High resolution CT chest was not suggestive of ILD.  V/Q scan did not show chronic PEs in 2016. Volume status stable on exam and weight fairly stable.  Recently, oxygen saturation has been >90% during the day and he has not been using oxygen during the day.  - As PH appears to be group 2 and group 3 (unlikely group 1), think it is reasonable to hold off on selective pulmonary vasodilators.  Treatment will be oxygen and diuresis (he cannot tolerate CPAP).  - BP has been doing well with no orthostatic symptoms => think he can decrease midodrine to 5 mg tid.   - Unable to perform PFTs.  2. Chronic diastolic CHF:  In addition to RV failure, last RHC showed elevated PCWP.  Echo 09/28/2017 EF 60-65% on echo.  NYHA II-III symptoms, stable to improved.  He is not volume overloaded on exam.  - Continue torsemide 40 mg daily. BMET today.  3. CKD: Stage 3 - BMET today.   4. GI bleeding: Capsule negative in 6/19.  No recent overt GI  bleeding.   - Follows with GI.  5. Cholelithiasis:  Admitted 12/21/17 with RUQ pain and N/V. ABD US showed cholelithiasis without evidence for cholecystitis. He improved with conservative therapy, and was also thought to overall be a very poor surgical candidate, possibly prohibitive by both HF and surgical teams. At this point, would avoid cholecystectomy as he would be at high risk for complications.  Would only operate if absolutely necessary.  He has only rare gallbladder colic at this point, his mother will bring him to the ER or call if he has any prolonged RUQ pain. Cholecystostomy tube would be an option if needed.  No recent RUQ pain.  - Now off ursodiol.  6. 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  - Continue atorvastatin 40 mg daily, check lipids today.  - Hold off on ASA 81 with GI bleeding history.   Followup in 4 months.    Loralie Champagne, MD  07/04/2018

## 2018-07-04 NOTE — Patient Instructions (Signed)
Decrease Midodrine to 5mg  three times daily.  Routine lab work today. Will notify you of abnormal results  Follow up in July. Please call our office at [phone] in May to schedule your July appointment.

## 2018-11-01 ENCOUNTER — Other Ambulatory Visit: Payer: Self-pay

## 2018-11-01 ENCOUNTER — Other Ambulatory Visit: Payer: Self-pay | Admitting: Internal Medicine

## 2018-11-01 ENCOUNTER — Ambulatory Visit
Admission: RE | Admit: 2018-11-01 | Discharge: 2018-11-01 | Disposition: A | Discharge status: Home / Self Care | Payer: No Typology Code available for payment source | Source: Ambulatory Visit | Attending: Internal Medicine | Admitting: Internal Medicine

## 2018-11-01 DIAGNOSIS — R52 Pain, unspecified: Secondary | ICD-10-CM

## 2018-11-01 HISTORY — PX: TRANSTHORACIC ECHOCARDIOGRAM: SHX275

## 2018-11-02 ENCOUNTER — Other Ambulatory Visit: Payer: Self-pay | Admitting: Internal Medicine

## 2018-11-02 DIAGNOSIS — K8020 Calculus of gallbladder without cholecystitis without obstruction: Secondary | ICD-10-CM

## 2018-11-02 DIAGNOSIS — R1011 Right upper quadrant pain: Secondary | ICD-10-CM

## 2018-11-08 ENCOUNTER — Ambulatory Visit
Admission: RE | Admit: 2018-11-08 | Discharge: 2018-11-08 | Disposition: A | Discharge status: Home / Self Care | Payer: No Typology Code available for payment source | Source: Ambulatory Visit | Attending: Internal Medicine | Admitting: Internal Medicine

## 2018-11-08 DIAGNOSIS — K8020 Calculus of gallbladder without cholecystitis without obstruction: Secondary | ICD-10-CM

## 2018-11-08 DIAGNOSIS — R1011 Right upper quadrant pain: Secondary | ICD-10-CM

## 2018-11-08 NOTE — Progress Notes (Signed)
Dr. Dorian Pod called and wanted relay info for Dr. Aundra Dubin for pt's upcoming visit:  Vitals:   Feb 12th 136/79       98bpm           945RA       175lbs June 8th  117/66   82bpm   94%RA   184lbs July1st  107/75   90bpm    96%RA   185lbs Labs taken July 1st GFR 38 BUN 33 CR 1.92 K+ 4.5.  Dr. Jimmye Norman states that weight gain is due to caloric intake and that the pt has no edema. She also states that pt has had no change in medications. Dr. Dorian Pod left number if questions arise [phone].

## 2018-11-09 ENCOUNTER — Ambulatory Visit (HOSPITAL_COMMUNITY)
Admission: RE | Admit: 2018-11-09 | Discharge: 2018-11-09 | Disposition: A | Discharge status: Home / Self Care | Payer: No Typology Code available for payment source | Source: Ambulatory Visit | Attending: Cardiology | Admitting: Cardiology

## 2018-11-09 ENCOUNTER — Other Ambulatory Visit: Payer: Self-pay

## 2018-11-09 DIAGNOSIS — I5032 Chronic diastolic (congestive) heart failure: Secondary | ICD-10-CM | POA: Diagnosis not present

## 2018-11-09 NOTE — Patient Instructions (Addendum)
No medication changes today  Labs and ECHO: Thursday 11/23/18 Labs 10:45am and ECHO at 11:15am at [facility]. Parking code [phone] We will only contact you if something comes back abnormal or we need to make some changes. Otherwise no news is good news!  Your physician has requested that you have an echocardiogram. Echocardiography is a painless test that uses sound waves to create images of your heart. It provides your doctor with information about the size and shape of your heart and how well your heart's chambers and valves are working. This procedure takes approximately one hour. There are no restrictions for this procedure.  Your physician recommends that you schedule a follow-up appointment in: 3 months with Dr Aundra Dubin, you will get a call to schedule this appointment.

## 2018-11-09 NOTE — Progress Notes (Signed)
Spoke with patients mother, avs reviewed.  Echo and lab appointment scheduled for 7/23.  She is aware they will get a separate phone call to schedule 3 month visit.  Verbalize understanding. AVS mailed.

## 2018-11-09 NOTE — Progress Notes (Signed)
Heart Failure TeleHealth Note  Due to national recommendations of social distancing due to [place] 19, Audio/video telehealth visit is felt to be most appropriate for this patient at this time.  See MyChart message from today for patient consent regarding telehealth for [facility].  Date:  11/09/2018   ID:  Tony Jones, DOB Feb 11, 1961, MRN [id number]  Location: Home  Provider location: [place] [facility] Type of Visit: Established patient   PCP:  Angelica Pou, MD  Cardiologist:  Glenetta Hew, MD HF Cardiology: Dr. Aundra Dubin  Chief Complaint: Shortness of breath   History of Present Illness: Tony Jones is a 58 y.o. male who presents via audio/video conferencing for a telehealth visit today.     he denies symptoms worrisome for COVID 19.   Patient has a history of developmental delay, CKD stage 3, chronic diastolic CHF with prominent RV failure and pulmonary hypertension, and OHS/OSA.  Patient was admitted in 6/19 with acute on chronic diastolic CHF with prominent RV failure.  RHC showed elevated right and left heart filling pressures with severe pulmonary arterial hypertension.  Patient is on high flow oxygen for OHS, he cannot tolerate CPAP. He was diuresed extensively.  He had upper GI bleeding.  Capsule study was done in the hospital but was unrevealing.    He lives with his mother who takes care of him and helps with ADLs.  Admitted 12/21/17 with RUQ pain and N/V. ABD US showed cholelithiasis without evidence for cholecystitis. He improved with conservative therapy, and was also thought to overall be a very poor surgical candidate, possibly prohibitive by both HF and surgical teams.   Breathing is stable.  He is using oxygen only at night.  His mother measures his oxygen saturation during the day and it is > 90%.  He is short of breath with longer distances walking but generally does fine around the house.  No chest pain.  He has had some gallbladder colic  (RUQ pain after a fatty meal).  Recent RUQ [place] again demonstrated cholelithiasis without evidence for acute cholecystitis.  Labs (6/19): K 3.6, creatinine 1.5 Labs (9/19): K 4.1, creatinine 1.61 Labs (3/20): LDL 79, HDL 31, K 3.8, creatinine 1.97  PMH: 1. Developmental delay.  2. CKD: Stage 3.  3. Barretts esophagus with GERD 4. Cholelithiasis 5. Nephrolithiasis 6. OHS/OSA: Wears high flow oxygen at all times, cannot tolerate CPAP.  7. Type II diabetes.  8. HTN 9. Hyperlipidemia 10. Upper GI bleeding: Capsule study in 6/19 was negative.  11. Coronary disease: Nonobstructive.  - LHC (6/19) with 50% ostial PDA, otherwise luminal irregularities.  12. Fe deficiency anemia: Due to GI blood losses.  13. 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   - RHC (6/19): mean RA 16, PA 93/51 mean 67, mean 27, CI 2.65, PVR 8 WU - V/Q scan (2016): No evidence for chronic PE.  - High resolution chest CT (6/19): no definite ILD.  - Echo (5/19): EF 60-65%, mild LVH, septal flattening with RV failure, PASP 75 mmhg.  14. Chronic diastolic CHF 15. ABIs (9/17): normal 16. Cholelithiasis  Current Outpatient Medications  Medication Sig Dispense Refill  . acetaminophen (TYLENOL) 650 MG CR tablet Take 1,300 mg by mouth daily as needed for pain.    Marland Kitchen allopurinol (ZYLOPRIM) 300 MG tablet Take 300 mg by mouth daily.     Marland Kitchen atorvastatin (LIPITOR) 40 MG tablet Take 1 tablet (40 mg total) by  mouth daily at 6 PM. 30 tablet 6  . citalopram (CELEXA) 20 MG tablet Take 20 mg by mouth daily.    . diclofenac sodium (VOLTAREN) 1 % GEL Apply 1 application topically 2 (two) times daily as needed (leg pain).    Marland Kitchen docusate sodium (COLACE) 100 MG capsule Take 100 mg by mouth daily as needed for mild constipation.     Marland Kitchen levocetirizine (XYZAL) 5 MG tablet Take 5 mg by mouth every evening.     . midodrine (PROAMATINE) 10 MG tablet Take 0.5 tablets (5 mg total) by mouth 3 (three) times  daily with meals.    . Multiple Vitamin (MULTIVITAMIN WITH MINERALS) TABS tablet Take 1 tablet by mouth daily with breakfast.     . pantoprazole (PROTONIX) 40 MG tablet Take 40 mg by mouth 2 (two) times daily with a meal.     . potassium chloride SA (K-DUR,KLOR-CON) 20 MEQ tablet Take 1 tablet (20 mEq total) by mouth daily. 30 tablet 3  . Propylene Glycol (SYSTANE BALANCE OP) Place 1 drop into both eyes 2 (two) times daily as needed (dry eyes).    . torsemide (DEMADEX) 20 MG tablet Take 20 mg by mouth 2 (two) times daily.    Marland Kitchen triamcinolone cream (KENALOG) 0.1 % Apply 1 application topically See admin instructions. Apply to arms daily     No current facility-administered medications for this encounter.     Allergies:   Codeine   Social History:  The patient  reports that he has never smoked. He has never used smokeless tobacco. He reports that he does not drink alcohol or use drugs.   Family History:  The patient's family history includes Atrial fibrillation in his father and paternal grandfather; Brain cancer in his father; Congestive Heart Failure in his father and paternal grandfather; Diabetes in his mother; Fainting in his paternal grandfather; Heart attack in his father; Heart disease in his father; Hypertension in his mother; Melanoma in his father; Stroke in his mother.   ROS:  Please see the history of present illness.   All other systems are personally reviewed and negative.   Exam:  (Video/Tele Health Call; Exam is subjective and or/visual.) General:  Speaks in full sentences. No resp difficulty. Lungs: Normal respiratory effort with conversation.  Abdomen: Non-distended per patient report Extremities: Pt denies edema. Neuro: Alert & oriented x 3.   Recent Labs: 04/04/2018: ALT 14; Hemoglobin 12.3; Platelets 168 07/04/2018: BUN 36; Creatinine, Ser 1.97; Potassium 3.8; Sodium 137  Personally reviewed   Wt Readings from Last 3 Encounters:  07/04/18 81.2 kg (179 lb)  04/04/18  78.7 kg (173 lb 6.4 oz)  01/03/18 79.3 kg (174 lb 12.8 oz)      ASSESSMENT AND PLAN:  1. Pulmonary hypertension with RV failure/cor pulmonale: Suspect that the pulmonary hypertension is a combination of group 2 (diastolic CHF) and primarily group 3 PH (OHS/OSA).  He is on high flow oxygen at all times but cannot tolerate CPAP.  High resolution CT chest was not suggestive of ILD.  V/Q scan did not show chronic PEs in 2016. Volume status stable on exam and weight is stable.  - Continue oxygen while sleeping, has not needed during the day recently.  - As PH appears to be group 2 and group 3 (unlikely group 1), think it is reasonable to hold off on selective pulmonary vasodilators.  Treatment will be oxygen and diuresis (he cannot tolerate CPAP).  - He continues to require midodrine 5 mg TID  to keep BP up.  - Unable to perform PFTs.  2. Chronic diastolic CHF:  In addition to RV failure, last RHC showed elevated PCWP.  Echo 09/28/2017 EF 60-65% on echo.  NYHA II-III symptoms, stable to improved.  I do not think that he is volume overloaded.  - Continue torsemide 40 mg daily. I will arrange for BMET.  - He is due for repeat echo to reassess RV in setting of significant RV failure.  3. CKD: Stage 3 - BMET today.   4. GI bleeding: Capsule negative in 6/19.  No recent overt GI bleeding.     - Follows with GI.  5. Cholelithiasis:  Admitted 12/21/17 with RUQ pain and N/V. ABD US showed cholelithiasis without evidence for cholecystitis. He improved with conservative therapy, and was also thought to overall be a very poor surgical candidate, possibly prohibitive by both HF and surgical teams. At this point, would avoid cholecystectomy as he would be at high risk for complications.  Would only operate if absolutely necessary.  He has only occasional gallbladder colic at this point but nothing persistent, his mother will bring him to the ER or call if he has any prolonged RUQ pain. Cholecystostomy tube would be a  potential option.  RUQ [place] recently showed cholelithiasis without acute cholecystitis.  - Continue ursodiol.  6. Nephrolithiasis: Tolerated urological procedure with Propofol well.  7. 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  - Continue atorvastatin 40 mg daily.  - Hold off on ASA 81 with GI bleeding history.    COVID screen The patient does not have any symptoms that suggest any further testing/ screening at this time.  Social distancing reinforced today.  Patient Risk: After full review of this patients clinical status, I feel that they are at moderate risk for cardiac decompensation at this time.  Relevant cardiac medications were reviewed at length with the patient today. The patient does not have concerns regarding their medications at this time.   Recommended follow-up:  3 months in office  Today, I have spent 16 minutes with the patient with telehealth technology discussing the above issues .    Signed, Loralie Champagne, MD  11/09/2018  [facility] [address] [facility] [place] [address] [phone] (office) [phone] (fax)

## 2018-11-23 ENCOUNTER — Other Ambulatory Visit: Payer: Self-pay

## 2018-11-23 ENCOUNTER — Ambulatory Visit (HOSPITAL_COMMUNITY)
Admission: RE | Admit: 2018-11-23 | Discharge: 2018-11-23 | Disposition: A | Discharge status: Home / Self Care | Payer: Medicare (Managed Care) | Source: Ambulatory Visit | Attending: Cardiology | Admitting: Cardiology

## 2018-11-23 ENCOUNTER — Ambulatory Visit (HOSPITAL_BASED_OUTPATIENT_CLINIC_OR_DEPARTMENT_OTHER)
Admission: RE | Admit: 2018-11-23 | Discharge: 2018-11-23 | Disposition: A | Discharge status: Home / Self Care | Payer: Medicare (Managed Care) | Source: Ambulatory Visit | Attending: Internal Medicine | Admitting: Internal Medicine

## 2018-11-23 DIAGNOSIS — E785 Hyperlipidemia, unspecified: Secondary | ICD-10-CM | POA: Diagnosis not present

## 2018-11-23 DIAGNOSIS — I272 Pulmonary hypertension, unspecified: Secondary | ICD-10-CM | POA: Insufficient documentation

## 2018-11-23 DIAGNOSIS — E1122 Type 2 diabetes mellitus with diabetic chronic kidney disease: Secondary | ICD-10-CM | POA: Insufficient documentation

## 2018-11-23 DIAGNOSIS — K219 Gastro-esophageal reflux disease without esophagitis: Secondary | ICD-10-CM | POA: Insufficient documentation

## 2018-11-23 DIAGNOSIS — I5032 Chronic diastolic (congestive) heart failure: Secondary | ICD-10-CM | POA: Insufficient documentation

## 2018-11-23 DIAGNOSIS — N189 Chronic kidney disease, unspecified: Secondary | ICD-10-CM | POA: Diagnosis not present

## 2018-11-23 DIAGNOSIS — I130 Hypertensive heart and chronic kidney disease with heart failure and stage 1 through stage 4 chronic kidney disease, or unspecified chronic kidney disease: Secondary | ICD-10-CM | POA: Diagnosis present

## 2018-11-23 LAB — BASIC METABOLIC PANEL
Anion gap: 10 (ref 5–15)
BUN: 34 mg/dL — ABNORMAL HIGH (ref 6–20)
CO2: 30 mmol/L (ref 22–32)
Calcium: 8.9 mg/dL (ref 8.9–10.3)
Chloride: 101 mmol/L (ref 98–111)
Creatinine, Ser: 2.05 mg/dL — ABNORMAL HIGH (ref 0.61–1.24)
GFR calc Af Amer: 40 mL/min — ABNORMAL LOW (ref >60)
GFR calc non Af Amer: 35 mL/min — ABNORMAL LOW (ref >60)
Glucose, Bld: 159 mg/dL — ABNORMAL HIGH (ref 70–99)
Potassium: 4.1 mmol/L (ref 3.5–5.1)
Sodium: 141 mmol/L (ref 135–145)

## 2018-11-23 NOTE — Progress Notes (Signed)
  Echocardiogram 2D Echocardiogram has been performed.  Mattilynn Forrer G Biddie Sebek 11/23/2018, 12:20 PM

## 2018-11-24 ENCOUNTER — Telehealth (HOSPITAL_COMMUNITY): Payer: Self-pay

## 2018-11-24 NOTE — Telephone Encounter (Signed)
-----   Message from [name], MD sent at 11/23/2018 10:18 PM EDT ----- Normal LV EF, RV appears normal.  Study looks ok.

## 2018-11-24 NOTE — Telephone Encounter (Signed)
Mother aware of results and appreciative.

## 2019-01-02 DIAGNOSIS — K8020 Calculus of gallbladder without cholecystitis without obstruction: Secondary | ICD-10-CM

## 2019-01-02 HISTORY — DX: Calculus of gallbladder without cholecystitis without obstruction: K80.20

## 2019-01-09 ENCOUNTER — Ambulatory Visit: Payer: Self-pay | Admitting: Surgery

## 2019-01-11 ENCOUNTER — Ambulatory Visit: Payer: Self-pay | Admitting: Surgery

## 2019-01-11 ENCOUNTER — Other Ambulatory Visit: Payer: Self-pay | Admitting: Surgery

## 2019-01-11 NOTE — H&P (Signed)
History of Present Illness Tony Jones. Tony Martorelli MD; 01/09/2019 5:05 PM) The patient is a 58 year old male who presents for evaluation of gall stones. PCP - Dr. Kathryne Eriksson Cards - [facility] - [facility]  This is a 58 year old male with developmental delay, history of Barrett's esophagus status post multiple endoscopic ablations at [facility] who presents with some digestive issues and documented gallstones. The patient was hospitalized in 2019 for hematemesis. He underwent EGD by Dr. Watt Climes on 08/02/17. This showed only some chronic gastritis. The patient has a CT scan from February 2019 that showed cholelithiasis with a 14 mm gallstone but no associated inflammatory changes. Recent ultrasound 11/08/18 confirmed gallstones but no cholecystitis. The patient is accompanied by his mother and his aunt. His father is deceased. The patient is cooperative but does not understand our conversation.  The mother relates that the frequency and severity of his abdominal pain is increasing This tends to occur after eating but sometimes occurs in the middle of the night. The patient has a very limited diet and eats mostly macaroni and cheese, hamburgers, and spaghetti. He has had some postprandial nausea and vomiting. He has had a couple of episodes of diarrhea but tends to be more constipated. He has had increased burping and flatulence.  He has been evaluated twice by cardiology who feels that he is very high risk for perioperative cardiac and pulmonary complications because of his pre-existing conditions. They recommended against surgery unless the patient became much more symptomatic. He is not really a candidate for percutaneous cholecystostomy because of his developmental delay and mental status. It is doubtful that he would be able to tolerate a long-term percutaneous cholecystostomy tube. His mother has become very concerned because of his increasing symptoms.  CLINICAL DATA: Right upper  quadrant pain for 3 weeks.  EXAM: ULTRASOUND ABDOMEN LIMITED RIGHT UPPER QUADRANT  COMPARISON: None.  FINDINGS: Gallbladder:  No wall thickening visualized. Gallstone is identified. No sonographic Murphy sign noted by sonographer.  Common bile duct:  Diameter: 3 mm  Liver:  No focal lesion identified. Within normal limits in parenchymal echogenicity. Portal vein is patent on color Doppler imaging with normal direction of blood flow towards the liver.  IMPRESSION: Cholelithiasis without sonographic evidence acute cholecystitis.   Electronically Signed By: Tony Jones M.D. On: 11/08/2018 08:23   Problem List/Past Medical Tony Key K. Torie Priebe, MD; 01/09/2019 5:05 PM) CHRONIC CHOLECYSTITIS WITH CALCULUS (K80.10)   Diagnostic Studies History (Tony Jones K. Bowyn Mercier, MD; 01/09/2019 5:05 PM) Colonoscopy  never  Allergies Tony Jones, CMA; 01/09/2019 3:16 PM) No Known Drug Allergies  [09/23/2017]: Allergies Reconciled   Medication History Tony Jones, CMA; 01/09/2019 3:18 PM) Allopurinol (300MG  Tablet, Oral) Active. Levocetirizine Dihydrochloride (5MG  Tablet, Oral) Active. Midodrine HCl (5MG  Tablet, Oral) Active. Pantoprazole Sodium (40MG  Tablet DR, Oral) Active. Vitamin B12 (1000MCG Tablet ER, Oral) Active. Atorvastatin Calcium (20MG  Tablet, Oral) Active. Potassium Chloride (25MEQ Packet, Oral) Active. Torsemide (10MG  Tablet, Oral) Active. Ursodiol (250MG  Tablet, Oral) Active. Citalopram Hydrobromide (20MG  Tablet, Oral) Active. Medications Reconciled  Social History Tony Jones. Tony Sethi, MD; 01/09/2019 5:05 PM) Tobacco use  Never smoker.  Family History Tony Jones. Tony Auvil, MD; 01/09/2019 5:05 PM) Arthritis  Brother, Mother. Cancer  Father. Cerebrovascular Accident  Mother. Depression  Mother. Diabetes Mellitus  Brother, Father. Heart Disease  Father.  Other Problems Tony Jones. Tony Rufener, MD; 01/09/2019 5:05 PM) Anxiety Disorder  Arthritis   Cholelithiasis  Congestive Heart Failure  Diabetes Mellitus  Gastroesophageal Reflux Disease  Kidney Tony Jones  Vitals Tony Jones CMA; 01/09/2019 3:16 PM) 01/09/2019 3:15 PM Weight: 188.2 lb Height: 61in Body Surface Area: 1.84 m Body Mass Index: 35.56 kg/m  Temp.: 97.25F  Pulse: 95 (Regular)  BP: 106/70(Sitting, Left Arm, Standard)       Physical Exam Tony Key K. Suni Jarnagin MD; 01/09/2019 5:06 PM) The physical exam findings are as follows: Note: WDWN in NAD Eyes: Pupils equal, round; sclera anicteric HENT: Oral mucosa moist; good dentition Neck: No masses palpated, no thyromegaly Lungs: CTA bilaterally; normal respiratory effort CV: Regular rate and rhythm; no murmurs; extremities well-perfused with no edema Abd: +bowel sounds, soft, obese, minimal RUQ tenderness; no palpable masses; no HSM; small umbilical hernia Skin: Warm, dry; no sign of jaundice Psychiatric - alert; occasionally agitated but easily redirected by his family members    Assessment & Plan Tony Jones. Tony Snodgrass MD; 01/09/2019 5:07 PM)  CHRONIC CHOLECYSTITIS WITH CALCULUS (K80.10)  Current Plans Schedule for Surgery - Laparoscopic cholecystectomy with intraoperative cholangiogram. We will first discuss the situation with Dr. Aundra Dubin, his cardiologist, since the patient is at high risk. He may need admission to the hospital 1-2 days prior to surgery for cardiac optimization.  The surgical procedure has been discussed with the patient. Potential risks, benefits, alternative treatments, and expected outcomes have been explained. All of the patient's questions at this time have been answered. The likelihood of reaching the patient's treatment goal is good. The patient understand the proposed surgical procedure and wishes to proceed.    Tony Jones. [name], MD, [facility] Surgery  General/ Trauma Surgery Beeper [phone]  01/11/2019 9:15 AM

## 2019-01-19 NOTE — Progress Notes (Signed)
Anesthesia Chart Review: Patient has pending admission scheduled for 01/22/2019.   Case: [id number] Date/Time: 01/24/19 1445   Procedure: LAPAROSCOPIC CHOLECYSTECTOMY WITH INTRAOPERATIVE CHOLANGIOGRAM (N/A )   Anesthesia type: General   Pre-op diagnosis: CHRONIC CALCULUS CHOLECYSTITIS   Location: [facility] ROOM 02 / [facility]   Surgeon: Donnie Mesa, MD      DISCUSSION: Tony Jones is a 58 year old male scheduled for the above procedure. He has developmental delays and lives with his mother who helps him with ADLs.  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 Aundra Dubin; OSA is strongly suspected, unable to tolerate CPAP), right heart failure/cor pulmonale, chronic diastolic CHF, chronic respiratory failure (nocturnal O2), HTN, dyslipidemia, DM2, CKD stage 3, anemia, GERD, developmental delay.   - Admit 12/21/17-12/23/17 with RUQ abdominal pain. US showed cholelithiasis without evidence of cholecystitis.Cholecystectomy considered, but cardiology felt patient extremely high right for any type of surgery unless absolutely necessary. He was medically managed. He was off sildenafil by that time, but still on torsemide, midodrine, and O2 (5-6 L) at that time.  Cardiology was consulted for clearance and deemed patient extremely high risk for any type of surgery unless if absolutely necessary/life threatening.  - Admit 5/319/19-6/13/19 with failure to thrive. Diagnosed with severe RV failure in the setting of severe pulmonary hypertension. Cardiology felt likely "group 3 PH from chronic hypoxemia, probably OHS/OSA." He had not had a sleep study, but strongly suspected. He had RHC/LHC (see below). Treated with torsemide, sildenafil, O2(HFNC 10-15L), and midodrine (hypotension likely due to RV failure). Also had anemia and cholelithiasis without active cholecystitis. Capsule EGD "unremarkable".    Summary of most recent cardiopulmonary testing (as outlined  by Dr. Aundra Dubin): - LHC (6/19): with 50% ostial PDA, otherwise luminal irregularities - RHC (6/19): mean RA 16, PA 93/51 mean 67, mean 27, CI 2.65, PVR 8 WU - Echo (5/19): EF 60-65%, mild LVH, septal flattening with RV failure, PASP 75 mmhg. - PFTs (7/19): Unable to perform  - High resolution chest CT (6/19): no definite ILD.  - V/Q scan (2016): No evidence for chronic PE.  - Currently last visit with Dr. Aundra Dubin was on 11/09/18. Breathing was stable. Using O2 only at night. SOB with long distances, but able to walk okay in the house. No chest pain. Still having gallbladder colic. If regards to management of cholelithiasis, he wrote, "He improved with conservative therapy [in August], and was also thought to overall be a very poor surgical candidate, possibly prohibitive by both HF and surgical teams. At this point, would avoid cholecystectomy as he would be at high risk for complications. Would only operate if absolutely necessary. He has only occasional gallbladder colic at this point but nothing persistent, his mother will bring him to the ER or call if he has any prolonged RUQ pain. Cholecystostomy tube would be a potential option.  RUQ [place] recently showed cholelithiasis without acute cholecystitis."    Since his July visit with Dr. Aundra Dubin, Dr. [name] wrote on 01/11/19 that Tony Jones is having more frequent and more severe abdominal pain. He is not a good candidate for a percutaneous cholecystostomy drain because of his mental status (doubtfult he would tolerate a long term tube). His mother Tony Jones) has become quite concerned about his worsening symptoms. Dr. [name] indicated he would discuss further with Dr. Aundra Dubin and plan hospital admission 1-2 days prior to surgery for cardiac optimization. Dr. [name] would also like a preoperative evaluation by anesthesiologist.  Planned [facility] admission 01/22/19 for 01/24/19 surgery. I will communicate with anesthesiologists request for preoperative in-patient  evaluation.   PROVIDERS: Angelica Pou, MD is listed as PCP Loralie Champagne, MD is HF cardiologist Glenetta Hew, MD is primary cardiologist (although mostly followed at [facility]) Lizbeth Bark, MD is GI [name], MD is GU   LABS: He will get updated labs prior to surgery. As of 11/23/18, lab results include: Lab Results  Component Value Date   WBC 9.3 04/04/2018   HGB 12.3 (L) 04/04/2018   HCT 36.3 (L) 04/04/2018   PLT 168 04/04/2018   GLUCOSE 159 (H) 11/23/2018   ALT 14 04/04/2018   AST 22 04/04/2018   NA 141 11/23/2018   K 4.1 11/23/2018   CL 101 11/23/2018   CREATININE 2.05 (H) 11/23/2018   BUN 34 (H) 11/23/2018   CO2 30 11/23/2018   HGBA1C 4.7 (L) 11/10/2017     PFTS 11/07/17: Patient unable to perform. Unable to follow instructions to do the study.   IMAGES: [place] ABD 11/08/18: FINDINGS: - Gallbladder: No wall thickening visualized. Gallstone is identified. No sonographic Murphy sign noted by sonographer. - Common bile duct: Diameter: 3 mm - Liver: No focal lesion identified. Within normal limits in parenchymal echogenicity. Portal vein is patent on color Doppler imaging with normal direction of blood flow towards the liver.  IMPRESSION: Cholelithiasis without sonographic evidence acute cholecystitis.   1V ABD Xray 11/01/18: FINDINGS: 14 mm rounded calcification in the right upper quadrant compatible with gallstone. Nonobstructive bowel gas pattern. Spleen appears prominent, question splenomegaly. No free air. No acute bony abnormality.  IMPRESSION: Cholelithiasis. Question splenomegaly.  Recommend clinical correlation.   EKG: Currently last EKG seen is from 09/30/17 and showed: Normal sinus rhythm  Possible left atrial enlargement Possible right ventricular hypertrophy Possible anterior septal infarct, age undetermined ST and T wave abnormality, consider inferior ischemia Abnormal ECG   CV: Echo 11/23/18: IMPRESSIONS  1. The left  ventricle has normal systolic function, with an ejection fraction of 55-60%. The cavity size was normal. moderate basal septal hypertrophy. Left ventricular diastolic Doppler parameters are consistent with impaired relaxation. No evidence of  left ventricular regional wall motion abnormalities.  2. The right ventricle has normal systolic function. The cavity was normal. There is no increase in right ventricular wall thickness. Right ventricular systolic pressure could not be assessed.  3. The aortic valve is tricuspid. Moderate sclerosis of the aortic valve.  4. The aorta is abnormal in size and structure.  5. There is mild dilatation of the aortic root measuring 38 mm.  6. The interatrial septum appears to be lipomatous.   RHC/LHC 10/05/17 Aundra Dubin, Kirk Ruths, MD): Hemodynamics (mmHg) RA 16 RV 92/16 PA 93/51 mean 67 PCWP mean 27 LV 110/21 AO 115/75  Oxygen saturations: PA 56% AO 99%  Cardiac Output (Fick) 4.95  Cardiac Index (Fick) 2.65 PVR 8 WU PAPi 2.65  Left Main  No left main, separate ostia LAD and LCx.  Left Anterior Descending  Separate ostium of LAD. LAD has luminal irregularities.  Left Circumflex  Separate ostium of LCx. Luminal irregularities.  Right Coronary Artery  Large caliber RCA with luminal irregularities. 50% stenosis ostial PDA.   Conclusion: 1. Severe pulmonary arterial hypertension.  2. Left and right heart filling pressures remain elevated, R>L.  3. Preserved cardiac output.  4. Nonobstructive coronary disease.   Continue IV Lasix for more diuresis, when more diuresed will start sildenafil (probably tomorrow).    Past Medical History:  Diagnosis  Date  . Anemia   . Anxiety   . Barrett esophagus   . Cholelithiasis   . Chronic respiratory failure with hypoxia ([place])   . CKD (chronic kidney disease) stage 3, GFR 30-59 ml/min (HCC)   . Developmental delay   . Diabetes mellitus type 2, uncontrolled ([place])   . Dyslipidemia   . GERD (gastroesophageal  reflux disease)   . Hypertension   . Mental disorder   . Nephrolithiasis   . Pulmonary hypertension ([place])    Likely related to long-standing hypoxia and possible OSA/OHS (group 3) also noted to have some component of group 1)  . Right heart failure [facility])     Past Surgical History:  Procedure Laterality Date  . CYSTOSCOPY/URETEROSCOPY/HOLMIUM LASER/STENT PLACEMENT Right 11/14/2017   Procedure: CYSTOSCOPY/URETEROSCOPY/RETROGRADE/STENT PLACEMENT;  Surgeon: [name], MD;  Location: [facility];  Service: Urology;  Laterality: Right;  75 MINS  SPINAL OR MAC ANESTHESIA  ALSO EXTRACTION OF STONE  . ESOPHAGOGASTRODUODENOSCOPY N/A 08/02/2017   Procedure: ESOPHAGOGASTRODUODENOSCOPY (EGD);  Surgeon: [name], MD;  Location: [facility];  Service: Endoscopy;  Laterality: N/A;  . GIVENS CAPSULE STUDY N/A 10/12/2017   Procedure: GIVENS CAPSULE STUDY;  Surgeon: [name], MD;  Location: [facility];  Service: Endoscopy;  Laterality: N/A;  . 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  Elevated left and right heart filling pressures - R>L. RAP 16 mmHg, RVP 92/16 mmHg; PAP 93/51 mmHg mean 67, PCWP mn27 mmHg. LVP 110/21 - EDP 21 mmHg. AoP 115/75 mmHg.  PaO2 56%, AoO2 99%. CO/I (Fick) 4.95-2.65, PVR 8 WU, PAPi 2.65  . THROAT SURGERY    . TRANSTHORACIC ECHOCARDIOGRAM  01/2015   Mild concentric LVH.  EF 60 to 65%.  GR 1 DD, but high filling pressures.  IVS had diastolic and systolic flattening consistent with volume and pressure overload.  PA pressures estimated 75 mmHg.    MEDICATIONS: No current facility-administered medications for this encounter.    Marland Kitchen acetaminophen (TYLENOL) 650 MG CR tablet  . allopurinol (ZYLOPRIM) 300 MG tablet  . atorvastatin (LIPITOR) 40 MG tablet  . citalopram (CELEXA) 20 MG tablet  .  docusate sodium (COLACE) 100 MG capsule  . fluticasone (FLONASE) 50 MCG/ACT nasal spray  . levocetirizine (XYZAL) 5 MG tablet  . LORazepam (ATIVAN) 0.5 MG tablet  . midodrine (PROAMATINE) 5 MG tablet  . Multiple Vitamin (MULTIVITAMIN WITH MINERALS) TABS tablet  . pantoprazole (PROTONIX) 40 MG tablet  . potassium chloride SA (K-DUR,KLOR-CON) 20 MEQ tablet  . Propylene Glycol (SYSTANE BALANCE OP)  . senna-docusate (SENOKOT-S) 8.6-50 MG tablet  . torsemide (DEMADEX) 20 MG tablet  . traMADol (ULTRAM) 50 MG tablet  . triamcinolone cream (KENALOG) 0.1 %  . ursodiol (ACTIGALL) 300 MG capsule  . midodrine (PROAMATINE) 10 MG tablet     Myra Gianotti, PA-C Surgical Short Stay/Anesthesiology [facility] Phone [phone] [facility] Phone [phone] 01/19/2019 5:44 PM

## 2019-01-19 NOTE — Anesthesia Preprocedure Evaluation (Addendum)
Anesthesia Evaluation  Patient identified by MRN, date of birth, ID band Patient awake    Reviewed: Allergy & Precautions, NPO status , Patient's Chart, lab work & pertinent test results  Airway Mallampati: III  TM Distance: >3 FB Neck ROM: Full    Dental  (+) Poor Dentition, Dental Advisory Given   Pulmonary    breath sounds clear to auscultation       Cardiovascular hypertension,  Rhythm:Regular Rate:Normal     Neuro/Psych    GI/Hepatic   Endo/Other  diabetes  Renal/GU      Musculoskeletal   Abdominal   Peds  Hematology   Anesthesia Other Findings   Reproductive/Obstetrics                            Anesthesia Physical Anesthesia Plan  ASA: III  Anesthesia Plan: General   Post-op Pain Management:    Induction: Intravenous  PONV Risk Score and Plan: Ondansetron and Dexamethasone  Airway Management Planned: Oral ETT  Additional Equipment:   Intra-op Plan:   Post-operative Plan: Extubation in OR  Informed Consent: I have reviewed the patients History and Physical, chart, labs and discussed the procedure including the risks, benefits and alternatives for the proposed anesthesia with the patient or authorized representative who has indicated his/her understanding and acceptance.     Dental advisory given  Plan Discussed with: CRNA and Anesthesiologist  Anesthesia Plan Comments: (See note by Myra Gianotti, PA-C. Dr. [name] has requested preoperative anesthesia evaluation (planned admission 01/22/19 for 01/24/19 surgery).)      Anesthesia Quick Evaluation

## 2019-01-22 ENCOUNTER — Inpatient Hospital Stay (HOSPITAL_COMMUNITY)
Admission: AD | Admit: 2019-01-22 | Discharge: 2019-01-25 | DRG: 418 | Disposition: A | Discharge status: Home / Self Care | Payer: Medicare (Managed Care) | Attending: Surgery | Admitting: Surgery

## 2019-01-22 DIAGNOSIS — G4733 Obstructive sleep apnea (adult) (pediatric): Secondary | ICD-10-CM | POA: Diagnosis not present

## 2019-01-22 DIAGNOSIS — E785 Hyperlipidemia, unspecified: Secondary | ICD-10-CM | POA: Diagnosis present

## 2019-01-22 DIAGNOSIS — I5082 Biventricular heart failure: Secondary | ICD-10-CM | POA: Diagnosis not present

## 2019-01-22 DIAGNOSIS — I2721 Secondary pulmonary arterial hypertension: Secondary | ICD-10-CM | POA: Diagnosis present

## 2019-01-22 DIAGNOSIS — Z808 Family history of malignant neoplasm of other organs or systems: Secondary | ICD-10-CM

## 2019-01-22 DIAGNOSIS — J9611 Chronic respiratory failure with hypoxia: Secondary | ICD-10-CM | POA: Diagnosis not present

## 2019-01-22 DIAGNOSIS — K812 Acute cholecystitis with chronic cholecystitis: Secondary | ICD-10-CM | POA: Diagnosis not present

## 2019-01-22 DIAGNOSIS — E669 Obesity, unspecified: Secondary | ICD-10-CM | POA: Diagnosis present

## 2019-01-22 DIAGNOSIS — K2270 Barrett's esophagus without dysplasia: Secondary | ICD-10-CM | POA: Diagnosis not present

## 2019-01-22 DIAGNOSIS — Z79891 Long term (current) use of opiate analgesic: Secondary | ICD-10-CM | POA: Diagnosis not present

## 2019-01-22 DIAGNOSIS — I2510 Atherosclerotic heart disease of native coronary artery without angina pectoris: Secondary | ICD-10-CM | POA: Diagnosis present

## 2019-01-22 DIAGNOSIS — Z885 Allergy status to narcotic agent status: Secondary | ICD-10-CM

## 2019-01-22 DIAGNOSIS — K219 Gastro-esophageal reflux disease without esophagitis: Secondary | ICD-10-CM | POA: Diagnosis present

## 2019-01-22 DIAGNOSIS — Z8261 Family history of arthritis: Secondary | ICD-10-CM

## 2019-01-22 DIAGNOSIS — Z79899 Other long term (current) drug therapy: Secondary | ICD-10-CM

## 2019-01-22 DIAGNOSIS — K8010 Calculus of gallbladder with chronic cholecystitis without obstruction: Secondary | ICD-10-CM | POA: Diagnosis present

## 2019-01-22 DIAGNOSIS — I130 Hypertensive heart and chronic kidney disease with heart failure and stage 1 through stage 4 chronic kidney disease, or unspecified chronic kidney disease: Secondary | ICD-10-CM | POA: Diagnosis not present

## 2019-01-22 DIAGNOSIS — N183 Chronic kidney disease, stage 3 (moderate): Secondary | ICD-10-CM | POA: Diagnosis present

## 2019-01-22 DIAGNOSIS — I509 Heart failure, unspecified: Secondary | ICD-10-CM

## 2019-01-22 DIAGNOSIS — E1122 Type 2 diabetes mellitus with diabetic chronic kidney disease: Secondary | ICD-10-CM | POA: Diagnosis present

## 2019-01-22 DIAGNOSIS — F419 Anxiety disorder, unspecified: Secondary | ICD-10-CM | POA: Diagnosis present

## 2019-01-22 DIAGNOSIS — Z818 Family history of other mental and behavioral disorders: Secondary | ICD-10-CM

## 2019-01-22 DIAGNOSIS — Z7951 Long term (current) use of inhaled steroids: Secondary | ICD-10-CM | POA: Diagnosis not present

## 2019-01-22 DIAGNOSIS — Z452 Encounter for adjustment and management of vascular access device: Secondary | ICD-10-CM

## 2019-01-22 DIAGNOSIS — I5032 Chronic diastolic (congestive) heart failure: Secondary | ICD-10-CM | POA: Diagnosis not present

## 2019-01-22 DIAGNOSIS — Z6835 Body mass index (BMI) 35.0-35.9, adult: Secondary | ICD-10-CM | POA: Diagnosis not present

## 2019-01-22 DIAGNOSIS — Z01810 Encounter for preprocedural cardiovascular examination: Secondary | ICD-10-CM | POA: Diagnosis not present

## 2019-01-22 DIAGNOSIS — Z823 Family history of stroke: Secondary | ICD-10-CM

## 2019-01-22 DIAGNOSIS — R6250 Unspecified lack of expected normal physiological development in childhood: Secondary | ICD-10-CM | POA: Diagnosis present

## 2019-01-22 DIAGNOSIS — Z833 Family history of diabetes mellitus: Secondary | ICD-10-CM

## 2019-01-22 DIAGNOSIS — Z8249 Family history of ischemic heart disease and other diseases of the circulatory system: Secondary | ICD-10-CM

## 2019-01-22 DIAGNOSIS — Z20828 Contact with and (suspected) exposure to other viral communicable diseases: Secondary | ICD-10-CM | POA: Diagnosis present

## 2019-01-22 DIAGNOSIS — I2781 Cor pulmonale (chronic): Secondary | ICD-10-CM | POA: Diagnosis present

## 2019-01-22 DIAGNOSIS — Z87442 Personal history of urinary calculi: Secondary | ICD-10-CM | POA: Diagnosis not present

## 2019-01-22 DIAGNOSIS — I878 Other specified disorders of veins: Secondary | ICD-10-CM

## 2019-01-22 DIAGNOSIS — Z419 Encounter for procedure for purposes other than remedying health state, unspecified: Secondary | ICD-10-CM

## 2019-01-22 LAB — COMPREHENSIVE METABOLIC PANEL
ALT: 13 U/L (ref 0–44)
AST: 18 U/L (ref 15–41)
Albumin: 3.4 g/dL — ABNORMAL LOW (ref 3.5–5.0)
Alkaline Phosphatase: 118 U/L (ref 38–126)
Anion gap: 11 (ref 5–15)
BUN: 26 mg/dL — ABNORMAL HIGH (ref 6–20)
CO2: 29 mmol/L (ref 22–32)
Calcium: 8.7 mg/dL — ABNORMAL LOW (ref 8.9–10.3)
Chloride: 99 mmol/L (ref 98–111)
Creatinine, Ser: 1.99 mg/dL — ABNORMAL HIGH (ref 0.61–1.24)
GFR calc Af Amer: 42 mL/min — ABNORMAL LOW (ref >60)
GFR calc non Af Amer: 36 mL/min — ABNORMAL LOW (ref >60)
Glucose, Bld: 224 mg/dL — ABNORMAL HIGH (ref 70–99)
Potassium: 3.9 mmol/L (ref 3.5–5.1)
Sodium: 139 mmol/L (ref 135–145)
Total Bilirubin: 0.4 mg/dL (ref 0.3–1.2)
Total Protein: 6.9 g/dL (ref 6.5–8.1)

## 2019-01-22 LAB — CBC
HCT: 38.1 % — ABNORMAL LOW (ref 39.0–52.0)
Hemoglobin: 13.7 g/dL (ref 13.0–17.0)
MCH: 31.9 pg (ref 26.0–34.0)
MCHC: 36 g/dL (ref 30.0–36.0)
MCV: 88.6 fL (ref 80.0–100.0)
Platelets: 142 10*3/uL — ABNORMAL LOW (ref 150–400)
RBC: 4.3 MIL/uL (ref 4.22–5.81)
RDW: 14.2 % (ref 11.5–15.5)
WBC: 8.7 10*3/uL (ref 4.0–10.5)
nRBC: 0 % (ref 0.0–0.2)

## 2019-01-22 LAB — BRAIN NATRIURETIC PEPTIDE: B Natriuretic Peptide: 10.2 pg/mL (ref 0.0–100.0)

## 2019-01-22 MED ORDER — DIPHENHYDRAMINE HCL 25 MG PO CAPS: 25.0000 mg | ORAL_CAPSULE | Freq: Four times a day (QID) | ORAL | Status: DC | PRN

## 2019-01-22 MED ORDER — POTASSIUM CHLORIDE CRYS ER 20 MEQ PO TBCR
20.0000 meq | EXTENDED_RELEASE_TABLET | Freq: Every day | ORAL | Status: DC
  Administered 2019-01-23 – 2019-01-25 (×3): 20 meq via ORAL
  Filled 2019-01-22 (×3): qty 1

## 2019-01-22 MED ORDER — LORATADINE 10 MG PO TABS
10.0000 mg | ORAL_TABLET | ORAL | Status: DC
  Administered 2019-01-23 – 2019-01-25 (×2): 10 mg via ORAL
  Filled 2019-01-22 (×2): qty 1

## 2019-01-22 MED ORDER — ACETAMINOPHEN 650 MG RE SUPP: 650.0000 mg | Freq: Four times a day (QID) | RECTAL | Status: DC | PRN

## 2019-01-22 MED ORDER — SENNOSIDES-DOCUSATE SODIUM 8.6-50 MG PO TABS
2.0000 | ORAL_TABLET | Freq: Every day | ORAL | Status: DC
  Administered 2019-01-23: 21:00:00 2 via ORAL
  Filled 2019-01-22 (×2): qty 2

## 2019-01-22 MED ORDER — DIPHENHYDRAMINE HCL 50 MG/ML IJ SOLN: 25.0000 mg | Freq: Four times a day (QID) | INTRAMUSCULAR | Status: DC | PRN

## 2019-01-22 MED ORDER — TRIAMCINOLONE ACETONIDE 0.1 % EX CREA
TOPICAL_CREAM | CUTANEOUS | Status: DC | PRN
  Filled 2019-01-22: qty 15

## 2019-01-22 MED ORDER — MIDODRINE HCL 5 MG PO TABS
5.0000 mg | ORAL_TABLET | Freq: Three times a day (TID) | ORAL | Status: DC
  Administered 2019-01-23 – 2019-01-25 (×6): 5 mg via ORAL
  Filled 2019-01-22 (×9): qty 1

## 2019-01-22 MED ORDER — FLUTICASONE PROPIONATE 50 MCG/ACT NA SUSP
1.0000 | Freq: Every day | NASAL | Status: DC
  Administered 2019-01-23 – 2019-01-25 (×2): 1 via NASAL
  Filled 2019-01-22: qty 16

## 2019-01-22 MED ORDER — ONDANSETRON HCL 4 MG/2ML IJ SOLN: 4.0000 mg | Freq: Four times a day (QID) | INTRAMUSCULAR | Status: DC | PRN

## 2019-01-22 MED ORDER — ALLOPURINOL 300 MG PO TABS
300.0000 mg | ORAL_TABLET | Freq: Every day | ORAL | Status: DC
  Administered 2019-01-23 – 2019-01-25 (×3): 300 mg via ORAL
  Filled 2019-01-22 (×3): qty 1

## 2019-01-22 MED ORDER — DOCUSATE SODIUM 100 MG PO CAPS: 100.0000 mg | ORAL_CAPSULE | Freq: Every day | ORAL | Status: DC | PRN

## 2019-01-22 MED ORDER — PROPYLENE GLYCOL 0.6 % OP SOLN: Freq: Two times a day (BID) | OPHTHALMIC | Status: DC | PRN

## 2019-01-22 MED ORDER — POLYVINYL ALCOHOL 1.4 % OP SOLN: 1.0000 [drp] | OPHTHALMIC | Status: DC | PRN

## 2019-01-22 MED ORDER — LORAZEPAM 0.5 MG PO TABS
0.5000 mg | ORAL_TABLET | Freq: Two times a day (BID) | ORAL | Status: DC | PRN
  Filled 2019-01-22: qty 1

## 2019-01-22 MED ORDER — MORPHINE SULFATE (PF) 2 MG/ML IV SOLN: 2.0000 mg | INTRAVENOUS | Status: DC | PRN

## 2019-01-22 MED ORDER — SODIUM CHLORIDE 0.9 % IV SOLN
INTRAVENOUS | Status: DC
  Administered 2019-01-22 – 2019-01-23 (×2): via INTRAVENOUS

## 2019-01-22 MED ORDER — ACETAMINOPHEN 325 MG PO TABS: 650.0000 mg | ORAL_TABLET | Freq: Four times a day (QID) | ORAL | Status: DC | PRN

## 2019-01-22 MED ORDER — TRIAMCINOLONE ACETONIDE 0.1 % EX CREA
1.0000 "application " | TOPICAL_CREAM | Freq: Two times a day (BID) | CUTANEOUS | Status: DC
  Administered 2019-01-23 (×3): 1 via TOPICAL
  Filled 2019-01-22: qty 15

## 2019-01-22 MED ORDER — ENOXAPARIN SODIUM 40 MG/0.4ML ~~LOC~~ SOLN
40.0000 mg | SUBCUTANEOUS | Status: DC
  Administered 2019-01-23: 40 mg via SUBCUTANEOUS
  Filled 2019-01-22: qty 0.4

## 2019-01-22 MED ORDER — ONDANSETRON 4 MG PO TBDP: 4.0000 mg | ORAL_TABLET | Freq: Four times a day (QID) | ORAL | Status: DC | PRN

## 2019-01-22 MED ORDER — SODIUM CHLORIDE 0.9 % IV SOLN: 2.0000 g | INTRAVENOUS | Status: DC

## 2019-01-22 MED ORDER — LEVOCETIRIZINE DIHYDROCHLORIDE 5 MG PO TABS: 2.5000 mg | ORAL_TABLET | ORAL | Status: DC

## 2019-01-22 MED ORDER — ATORVASTATIN CALCIUM 40 MG PO TABS
40.0000 mg | ORAL_TABLET | Freq: Every day | ORAL | Status: DC
  Administered 2019-01-23: 40 mg via ORAL
  Filled 2019-01-22: qty 1

## 2019-01-22 MED ORDER — TORSEMIDE 20 MG PO TABS
20.0000 mg | ORAL_TABLET | Freq: Two times a day (BID) | ORAL | Status: DC
  Administered 2019-01-23 – 2019-01-25 (×4): 20 mg via ORAL
  Filled 2019-01-22 (×6): qty 1

## 2019-01-22 MED ORDER — CITALOPRAM HYDROBROMIDE 20 MG PO TABS
20.0000 mg | ORAL_TABLET | Freq: Every day | ORAL | Status: DC
  Administered 2019-01-23 – 2019-01-25 (×3): 20 mg via ORAL
  Filled 2019-01-22 (×3): qty 1

## 2019-01-22 MED ORDER — PANTOPRAZOLE SODIUM 40 MG PO TBEC
40.0000 mg | DELAYED_RELEASE_TABLET | Freq: Two times a day (BID) | ORAL | Status: DC
  Administered 2019-01-23 – 2019-01-25 (×4): 40 mg via ORAL
  Filled 2019-01-22 (×4): qty 1

## 2019-01-22 NOTE — Progress Notes (Signed)
Tony Jones is a 58 y.o. male patient admitted from home awake, alert - oriented  X 4 - no acute distress noted, Mother at bedside.  VSS - Blood pressure 124/72, pulse 90, temperature 98.9 F (37.2 C), temperature source Oral, resp. rate 14, height 5\' 1"  (1.549 m), weight 84.9 kg, SpO2 97 %.      Will cont to eval and treat per MD orders.  Vidal Schwalbe, RN 01/22/2019 10:32 PM

## 2019-01-22 NOTE — H&P (Signed)
History of Present Illness Tony Jones. Tony Godlewski Jones; 01/09/2019 5:05 PM) The patient is a 58 year old male who presents for evaluation of gall stones. PCP - Dr. Kathryne Eriksson Cards - [facility] - [facility] - [place]  This is a 58 year old male with developmental delay, history of Barrett's esophagus status post multiple endoscopic ablations at [facility] who presents with some digestive issues and documented gallstones. The patient was hospitalized in 2019 for hematemesis. He underwent EGD by Dr. Watt Climes on 08/02/17. This showed only some chronic gastritis. The patient has a CT scan from February 2019 that showed cholelithiasis with a 14 mm gallstone but no associated inflammatory changes. Recent ultrasound 11/08/18 confirmed gallstones but no cholecystitis. The patient is accompanied by his mother and his aunt. His father is deceased. The patient is cooperative but does not understand our conversation.  The mother relates that the frequency and severity of his abdominal pain is increasing This tends to occur after eating but sometimes occurs in the middle of the night. The patient has a very limited diet and eats mostly macaroni and cheese, hamburgers, and spaghetti. He has had some postprandial nausea and vomiting. He has had a couple of episodes of diarrhea but tends to be more constipated. He has had increased burping and flatulence.  He has been evaluated twice by cardiology who feels that he is very high risk for perioperative cardiac and pulmonary complications because of his pre-existing conditions. They recommended against surgery unless the patient became much more symptomatic. He is not really a candidate for percutaneous cholecystostomy because of his developmental delay and mental status. It is doubtful that he would be able to tolerate a long-term percutaneous cholecystostomy tube. His mother has become very concerned because of his increasing symptoms.  CLINICAL DATA: Right upper  quadrant pain for 3 weeks.  EXAM: ULTRASOUND ABDOMEN LIMITED RIGHT UPPER QUADRANT  COMPARISON: None.  FINDINGS: Gallbladder:  No wall thickening visualized. Gallstone is identified. No sonographic Murphy sign noted by sonographer.  Common bile duct:  Diameter: 3 mm  Liver:  No focal lesion identified. Within normal limits in parenchymal echogenicity. Portal vein is patent on color Doppler imaging with normal direction of blood flow towards the liver.  IMPRESSION: Cholelithiasis without sonographic evidence acute cholecystitis.   Electronically Signed By: Tony Jones   Problem List/Past Medical Tony Key K. Eppie Barhorst, Jones; 01/09/2019 5:05 PM) CHRONIC CHOLECYSTITIS WITH CALCULUS (K80.10)   Diagnostic Studies History (Tony Shankel K. Lexy Meininger, Jones; 01/09/2019 5:05 PM) Colonoscopy  never  Allergies Tony Jones; 01/09/2019 3:16 PM) No Known Drug Allergies  [09/23/2017]: Allergies Reconciled   Medication History Tony Jones; 01/09/2019 3:18 PM) Allopurinol (300MG  Tablet, Oral) Active. Levocetirizine Dihydrochloride (5MG  Tablet, Oral) Active. Midodrine HCl (5MG  Tablet, Oral) Active. Pantoprazole Sodium (40MG  Tablet DR, Oral) Active. Vitamin B12 (1000MCG Tablet ER, Oral) Active. Atorvastatin Calcium (20MG  Tablet, Oral) Active. Potassium Chloride (25MEQ Packet, Oral) Active. Torsemide (10MG  Tablet, Oral) Active. Ursodiol (250MG  Tablet, Oral) Active. Citalopram Hydrobromide (20MG  Tablet, Oral) Active. Medications Reconciled  Social History Tony Jones. Tony Jones; 01/09/2019 5:05 PM) Tobacco use  Never smoker.  Family History Tony Jones. Tony Ulloa, Jones; 01/09/2019 5:05 PM) Arthritis  Brother, Mother. Cancer  Father. Cerebrovascular Accident  Mother. Depression  Mother. Diabetes Mellitus  Brother, Father. Heart Disease  Father.  Other Problems Tony Jones. Tony Bonaparte, Jones; 01/09/2019 5:05 PM) Anxiety Disorder   Arthritis  Cholelithiasis  Congestive Heart Failure  Diabetes Mellitus  Gastroesophageal Reflux Disease  Kidney Tony Jones  Vitals Tony Jones Jones; 01/09/2019 3:16 PM) 01/09/2019 3:15 PM Weight: 188.2 lb Height: 61in Body Surface Area: 1.84 m Body Mass Index: 35.56 kg/m  Temp.: 97.46F  Pulse: 95 (Regular)  BP: 106/70(Sitting, Left Arm, Standard)       Physical Exam Tony Key K. Jermanie Minshall Jones; 01/09/2019 5:06 PM) The physical exam findings are as follows: Note: WDWN in NAD Eyes: Pupils equal, round; sclera anicteric HENT: Oral mucosa moist; good dentition Neck: No masses palpated, no thyromegaly Lungs: CTA bilaterally; normal respiratory effort CV: Regular rate and rhythm; no murmurs; extremities well-perfused with no edema Abd: +bowel sounds, soft, obese, minimal RUQ tenderness; no palpable masses; no HSM; small umbilical hernia Skin: Warm, dry; no sign of jaundice Psychiatric - alert; occasionally agitated but easily redirected by his family members    Assessment & Plan Tony Jones. Tony Bache Jones; 01/09/2019 5:07 PM)  CHRONIC CHOLECYSTITIS WITH CALCULUS (K80.10)  Current Plans Schedule for Surgery - Laparoscopic cholecystectomy with intraoperative cholangiogram. We will first discuss the situation with Dr. Aundra Dubin, his cardiologist, since the patient is at high risk. He may need admission to the hospital 1-2 days prior to surgery for cardiac optimization.  The surgical procedure has been discussed with the patient. Potential risks, benefits, alternative treatments, and expected outcomes have been explained. All of the patient's questions at this time have been answered. The likelihood of reaching the patient's treatment goal is good. The patient understand the proposed surgical procedure and wishes to proceed.    Tony Jones. Tony Dover, Jones, [facility] [facility] [phone]  01/22/2019 9:20 PM

## 2019-01-23 ENCOUNTER — Inpatient Hospital Stay (HOSPITAL_COMMUNITY): Payer: Medicare (Managed Care)

## 2019-01-23 ENCOUNTER — Inpatient Hospital Stay (HOSPITAL_COMMUNITY): Admission: RE | Admit: 2019-01-23 | Payer: Medicare (Managed Care) | Source: Home / Self Care | Admitting: Surgery

## 2019-01-23 DIAGNOSIS — I2721 Secondary pulmonary arterial hypertension: Secondary | ICD-10-CM

## 2019-01-23 DIAGNOSIS — Z01810 Encounter for preprocedural cardiovascular examination: Secondary | ICD-10-CM

## 2019-01-23 LAB — SURGICAL PCR SCREEN
MRSA, PCR: NEGATIVE
Staphylococcus aureus: NEGATIVE

## 2019-01-23 LAB — SARS CORONAVIRUS 2 BY RT PCR (HOSPITAL ORDER, PERFORMED IN ~~LOC~~ HOSPITAL LAB): SARS Coronavirus 2: NEGATIVE

## 2019-01-23 MED ORDER — CEFAZOLIN SODIUM-DEXTROSE 2-4 GM/100ML-% IV SOLN
2.0000 g | Freq: Once | INTRAVENOUS | Status: AC
  Administered 2019-01-24: 2 g via INTRAVENOUS

## 2019-01-23 NOTE — Consult Note (Signed)
Advanced Heart Failure Team Consult Note   Primary Physician: Angelica Pou, MD PCP-Cardiologist:  Glenetta Hew, MD  Requesting: [name]  Reason for Consultation: Pre Operative Clearance prior to cholecystectomy.    HPI:    Tony Jones is seen today for evaluation of preop clearance  at the request of  Dr [name].   Tony Jones is a 58 year old with history of developmental delay, CKD stage 3, DM2, chronic diastolic CHF with prominent RV failure and pulmonary hypertension, OHS/OSA, and cholelithiasis.  Patient was admitted in 6/19 with acute on chronic diastolic CHF with prominent RV failure. RHC showed elevated right and left heart filling pressures with severe pulmonary arterial hypertension. Tony Jones was massively volume overloaded and diuresed aggressively. Tony Jones was started on O2. Unable to wear CPAP.   Most recent [place] was in 2019 with PA pressures 93/51, PVR 8, CO 5, CI 2.65. Tony Jones was placed on sildenafil but intolerant due to hypotension. Tony Jones has been on midodrine 5 mg three times a day. At that time was having biliary colic but felt too high risk for surgery so conservative therapy pursued.  Since that time Tony Jones has been following with the [facility] program and also wearing O2 regularly at home and with exertion. Tony Jones has been doing exercises with the [facility] program and actually no longer requires O2 with exertion. Tony Jones Mom says Tony Jones can walk up and down the driveway and up steps without problem. [place]. No syncope. LE edema has resolved.   Tony Jones recently had teleheath visit with Dr Aundra Dubin.  Echo was repeated and in July 2020 and showed normal LVEF, RV much less dilated with mild to moderate HK, mild septal flattening.Unable to estimate RVSP due to insufficient TR jet.  Tony Jones has been having worsening RUQ symptoms and has been admitted by [facility] for possible cholecystectomy. Tony Jones mother says that Tony Jones would not tolerate a perc-tube at all.    Review of Systems: [y] = yes, _0  =  no   . General: Weight gain _1 ; Weight loss _2 ; Anorexia _3 ; Fatigue [  ]; Fever _4 ; Chills _5 ; Weakness _6   . Cardiac: Chest pain/pressure _7 ; Resting SOB _8 ; Exertional SOB _9 ; Orthopnea _10 ; Pedal Edema _11 ; Palpitations _12 ; Syncope _13 ; Presyncope _14 ; Paroxysmal nocturnal dyspnea_15   . Pulmonary: Cough _16 ; Wheezing_17 ; Hemoptysis_18 ; Sputum _19 ; Snoring _20   . GI: Vomiting_21 ; Dysphagia_22 ; Melena_23 ; Hematochezia _24 ; Heartburn_25 ; Abdominal pain [Y ]; Constipation _26 ; Diarrhea _27 ; BRBPR _28   . GU: Hematuria_29 ; Dysuria _30 ; Nocturia_31   . Vascular: Pain in legs with walking _32 ; Pain in feet with lying flat _33 ; Non-healing sores _34 ; Stroke _35 ; TIA _36 ; Slurred speech _37 ;  . Neuro: Headaches_38 ; Vertigo_39 ; Seizures_40 ; Paresthesias_41 ;Blurred vision _42 ; Diplopia _43 ; Vision changes _44   . Ortho/Skin: Arthritis _45 ; Joint pain [  ]; Muscle pain _46 ; Joint swelling _47 ; Back Pain [ Y]; Rash _48   . Psych: Depression_49 ; Anxiety_50   . Heme: Bleeding problems _51 ; Clotting disorders _52 ; Anemia _53   . Endocrine: Diabetes _54 ; Thyroid dysfunction_55   Home Medications Prior to Admission medications   Medication Sig Start Date End Date Taking? Authorizing Provider  acetaminophen (TYLENOL) 650 MG CR tablet  Take 1,300 mg by mouth daily as needed for pain.    [provider]  allopurinol (ZYLOPRIM) 300 MG tablet Take 300 mg by mouth daily.  01/18/15   [provider]  atorvastatin (LIPITOR) 40 MG tablet Take 1 tablet (40 mg total) by mouth daily at 6 PM. 10/13/17   Tillery, Satira Mccallum, PA-C  citalopram (CELEXA) 20 MG tablet Take 20 mg by mouth daily.    [provider]  docusate sodium (COLACE) 100 MG capsule Take 100 mg by mouth daily as needed for mild constipation.     [provider]  fluticasone (FLONASE) 50 MCG/ACT nasal spray Place 1 spray into both nostrils daily.    [provider]  levocetirizine (XYZAL) 5 MG tablet Take  2.5 mg by mouth every other day.     [provider]  LORazepam (ATIVAN) 0.5 MG tablet Take 0.5 mg by mouth 2 (two) times daily as needed for anxiety.    [provider]  midodrine (PROAMATINE) 10 MG tablet Take 0.5 tablets (5 mg total) by mouth 3 (three) times daily with meals. Patient not taking: Reported on 01/19/2019 07/04/18   [name], MD  midodrine (PROAMATINE) 5 MG tablet Take 5 mg by mouth See admin instructions. Take 1 tablet (5 mg) by mouth 3 times daily, except take an additional tablet (5 mg) by mouth at midday on center days to maintain adequate blood pressure.    [provider]  Multiple Vitamin (MULTIVITAMIN WITH MINERALS) TABS tablet Take 1 tablet by mouth daily with breakfast.     [provider]  pantoprazole (PROTONIX) 40 MG tablet Take 40 mg by mouth 2 (two) times daily with a meal.     [provider]  potassium chloride SA (K-DUR,KLOR-CON) 20 MEQ tablet Take 1 tablet (20 mEq total) by mouth daily. 10/14/17   Shirley Friar, PA-C  Propylene Glycol (SYSTANE BALANCE OP) Place 1 drop into both eyes 2 (two) times daily as needed (dry eyes).    [provider]  senna-docusate (SENOKOT-S) 8.6-50 MG tablet Take 2 tablets by mouth at bedtime.    [provider]  torsemide (DEMADEX) 20 MG tablet Take 20 mg by mouth 2 (two) times daily.    [provider]  traMADol (ULTRAM) 50 MG tablet Take 50 mg by mouth 2 (two) times daily as needed for severe pain.    [provider]  triamcinolone cream (KENALOG) 0.1 % Apply 1 application topically See admin instructions. Apply to arms daily    [provider]  ursodiol (ACTIGALL) 300 MG capsule Take 300 mg by mouth 2 (two) times daily.    [provider]    Past Medical History: Past Medical History:  Diagnosis Date  . Anemia   . Anxiety   . Barrett esophagus   . Cholelithiasis   . Chronic respiratory failure with hypoxia ([place])    . CKD (chronic kidney disease) stage 3, GFR 30-59 ml/min ([facility])   . Developmental delay   . Diabetes mellitus type 2, uncontrolled ([place])   . Dyslipidemia   . GERD (gastroesophageal reflux disease)   . Hypertension   . Mental disorder   . Nephrolithiasis   . Pulmonary hypertension ([place])    Likely related to long-standing hypoxia and possible OSA/OHS (group 3) also noted to have some component of group 1)  . Right heart failure [facility])     Past Surgical History: Past Surgical History:  Procedure Laterality Date  . CYSTOSCOPY/URETEROSCOPY/HOLMIUM  LASER/STENT PLACEMENT Right 11/14/2017   Procedure: CYSTOSCOPY/URETEROSCOPY/RETROGRADE/STENT PLACEMENT;  Surgeon: [name], MD;  Location: [facility];  Service: Urology;  Laterality: Right;  75 MINS  SPINAL OR MAC ANESTHESIA  ALSO EXTRACTION OF STONE  . ESOPHAGOGASTRODUODENOSCOPY N/A 08/02/2017   Procedure: ESOPHAGOGASTRODUODENOSCOPY (EGD);  Surgeon: [name], MD;  Location: [facility];  Service: Endoscopy;  Laterality: N/A;  . GIVENS CAPSULE STUDY N/A 10/12/2017   Procedure: GIVENS CAPSULE STUDY;  Surgeon: [name], MD;  Location: [facility];  Service: Endoscopy;  Laterality: N/A;  . 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  Elevated left and right heart filling pressures - R>L. RAP 16 mmHg, RVP 92/16 mmHg; PAP 93/51 mmHg mean 67, PCWP mn27 mmHg. LVP 110/21 - EDP 21 mmHg. AoP 115/75 mmHg.  PaO2 56%, AoO2 99%. CO/I (Fick) 4.95-2.65, PVR 8 WU, PAPi 2.65  . THROAT SURGERY    . TRANSTHORACIC ECHOCARDIOGRAM  01/2015   Mild concentric LVH.  EF 60 to 65%.  GR 1 DD, but high filling pressures.  IVS had diastolic and systolic flattening consistent with volume and pressure overload.  PA pressures estimated 75 mmHg.    Family History: Family History  Problem  Relation Age of Onset  . Congestive Heart Failure Father   . Heart disease Father   . Heart attack Father   . Atrial fibrillation Father   . Brain cancer Father   . Melanoma Father   . Diabetes Mother   . Stroke Mother   . Hypertension Mother   . Fainting Paternal Grandfather   . Congestive Heart Failure Paternal Grandfather   . Atrial fibrillation Paternal Grandfather     Social History: Social History   Socioeconomic History  . Marital status: Single    Spouse name: Not on file  . Number of children: Not on file  . Years of education: Not on file  . Highest education level: Not on file  Occupational History  . Occupation: unemployed  Social Needs  . Financial resource strain: Not on file  . Food insecurity    Worry: Not on file    Inability: Not on file  . Transportation needs    Medical: Not on file    Non-medical: Not on file  Tobacco Use  . Smoking status: Never Smoker  . Smokeless tobacco: Never Used  Substance and Sexual Activity  . Alcohol use: No  . Drug use: No  . Sexual activity: Never  Lifestyle  . Physical activity    Days per week: Not on file    Minutes per session: Not on file  . Stress: Not on file  Relationships  . Social Herbalist on phone: Not on file    Gets together: Not on file    Attends religious service: Not on file    Active member of club or organization: Not on file    Attends meetings of clubs or organizations: Not on file    Relationship status: Not on file  Other Topics Concern  . Not on file  Social History Narrative  . Not on file    Allergies:  Allergies  Allergen Reactions  . Codeine Nausea And Vomiting    Objective:    Vital Signs:   Temp:  [98 F (36.7 C)-98.9 F (37.2 C)] 98 F (36.7 C) (09/22 0550) Pulse Rate:  [81-90]  81 (09/22 0550) Resp:  [14-19] 19 (09/22 0550) BP: (111-124)/(72-80) 111/80 (09/22 0550) SpO2:  [91 %-97 %] 91 % (09/22 0550) Weight:  [84.9 kg] 84.9 kg (09/21 2100) Last BM  Date: 01/22/19  Weight change: Filed Weights   01/22/19 2100  Weight: 84.9 kg    Intake/Output:   Intake/Output Summary (Last 24 hours) at 01/23/2019 0808 Last data filed at 01/23/2019 0603 Gross per 24 hour  Intake 389.04 ml  Output 600 ml  Net -210.96 ml      Physical Exam    General:  Lying in bed. No resp difficulty HEENT: normal Neck: supple. JVP flat. Carotids 2+ bilat; no bruits. No lymphadenopathy or thyromegaly appreciated. Cor: PMI nondisplaced. Regular rate & rhythm. 2/6 TR with prominent P2. No RV heave Lungs: clear Abdomen: soft, nontender, nondistended. No hepatosplenomegaly. No bruits or masses. Good bowel sounds. Extremities: no cyanosis, clubbing, rash, edema Neuro: alert & orientedx3, cranial nerves grossly intact. moves all 4 extremities w/o difficulty. Affect pleasant   Telemetry   Sinus 80s Personally reviewed  EKG    NSR 90 No ST-T wave abnormalities. No RV strain Personally reviewed   Labs   Basic Metabolic Panel: Recent Labs  Lab 01/22/19 2138  NA 139  K 3.9  CL 99  CO2 29  GLUCOSE 224*  BUN 26*  CREATININE 1.99*  CALCIUM 8.7*    Liver Function Tests: Recent Labs  Lab 01/22/19 2138  AST 18  ALT 13  ALKPHOS 118  BILITOT 0.4  PROT 6.9  ALBUMIN 3.4*   No results for input(s): LIPASE, AMYLASE in the last 168 hours. No results for input(s): AMMONIA in the last 168 hours.  CBC: Recent Labs  Lab 01/22/19 2138  WBC 8.7  HGB 13.7  HCT 38.1*  MCV 88.6  PLT 142*    Cardiac Enzymes: No results for input(s): CKTOTAL, CKMB, CKMBINDEX, TROPONINI in the last 168 hours.  BNP: BNP (last 3 results) Recent Labs    01/22/19 2138  BNP 10.2    ProBNP (last 3 results) No results for input(s): PROBNP in the last 8760 hours.   CBG: No results for input(s): GLUCAP in the last 168 hours.  Coagulation Studies: No results for input(s): LABPROT, INR in the last 72 hours.   Imaging    No results found.   Medications:      Current Medications: . allopurinol  300 mg Oral Daily  . atorvastatin  40 mg Oral q1800  . citalopram  20 mg Oral Daily  . enoxaparin (LOVENOX) injection  40 mg Subcutaneous Q24H  . fluticasone  1 spray Each Nare Daily  . loratadine  10 mg Oral QODAY  . midodrine  5 mg Oral TID WC  . pantoprazole  40 mg Oral BID WC  . potassium chloride SA  20 mEq Oral Daily  . senna-docusate  2 tablet Oral QHS  . torsemide  20 mg Oral BID  . triamcinolone cream  1 application Topical BID     Infusions: . sodium chloride 50 mL/hr at 01/23/19 0603       Assessment/Plan   1. Acute on chronic cholecystitis -  Planning for lap cholecystectomy.  -  We are asked to provide pre-op CV risk stratification. I have reviewed Tony Jones echos from 6/19 and 7/20 and discussed case with Dr. Aundra Dubin. In 2019 had evidence of severe PAH with marked RV strain/failure and was clearly not a candidate for surgery. Since that time Tony Jones has been diuresed and much more compliant  with Tony Jones O2. Tony Jones functional status is much improved and now off O2 during the day. Echo from 7/20 show marked improvement in RV strain. BNP normal at 10.2.  - Currently I think Tony Jones would be low to moderate risk for peri-op CV complications (including death) but I think risk is acceptable for surgery. I d/w Tony Jones mother who says Tony Jones is miserable with chronic GB pain and agrees this level of risk would be tolerable and they are willing to proceed. She also feels strongly that Tony Jones would not tolerate a perc-tube so this is really Tony Jones only option.   2. PAH/RV Failure  - RHC 10/2017 PA pressures in 90s with PVR 8. Intolerant CPAP. Intolerant sildenafil due to hypotension.  - See discussion above. Much improved by echo in 7/20   3. Chronic Respiratory Failure - improved. Continue nighttime O2.   4. CKD 3 - baseline xcreatinine ~2.0. - this is stable.  - avoid hypotension intra-operatively if at all possible   5. DM2 - no recent HgbA1c - cover with SSI   Length of Stay: 1  Glori Bickers, MD  8:52 AM   Advanced Heart Failure Team Pager [phone] (M-F; 7a - 4p)  Please contact [facility] for night-coverage after hours (4p -7a ) and weekends on [email or web address]

## 2019-01-23 NOTE — Progress Notes (Signed)
   Subjective/Chief Complaint: Patient tolerating clears - flatulent but minimal abdominal pain Seen by cardiology - cleared for surgery Will be seen by Anesthesia this afternoon.   Objective: Vital signs in last 24 hours: Temp:  [98 F (36.7 C)-98.9 F (37.2 C)] 98 F (36.7 C) (09/22 0550) Pulse Rate:  [81-90] 81 (09/22 0550) Resp:  [14-19] 19 (09/22 0550) BP: (111-124)/(72-80) 111/80 (09/22 0550) SpO2:  [91 %-97 %] 91 % (09/22 0550) Weight:  [84.9 kg] 84.9 kg (09/21 2100) Last BM Date: 01/22/19  Intake/Output from previous day: 09/21 0701 - 09/22 0700 In: 389 [I.V.:389] Out: 600 [Urine:600] Intake/Output this shift: Total I/O In: -  Out: 600 [Urine:600]  General appearance: alert, cooperative and no distress GI: soft, mildly distended, non-tender  Lab Results:  Recent Labs    01/22/19 2138  WBC 8.7  HGB 13.7  HCT 38.1*  PLT 142*   BMET Recent Labs    01/22/19 2138  NA 139  K 3.9  CL 99  CO2 29  GLUCOSE 224*  BUN 26*  CREATININE 1.99*  CALCIUM 8.7*   Hepatic Function Latest Ref Rng & Units 01/22/2019 04/04/2018 12/22/2017  Total Protein 6.5 - 8.1 g/dL 6.9 7.6 7.5  Albumin 3.5 - 5.0 g/dL 3.4(L) 4.0 3.8  AST 15 - 41 U/L _0 ALT 0 - 44 U/L _1 Alk Phosphatase 38 - 126 U/L 118 93 90  Total Bilirubin 0.3 - 1.2 mg/dL 0.4 0.5 0.6    PT/INR No results for input(s): LABPROT, INR in the last 72 hours. ABG No results for input(s): PHART, HCO3 in the last 72 hours.  Invalid input(s): PCO2, PO2  Studies/Results: Dg Chest 2v Repeat Same Day  Result Date: 01/23/2019 CLINICAL DATA:  Congestive heart failure. EXAM: CHEST - 2 VIEW SAME DAY COMPARISON:  Chest x-ray dated December 22, 2017. FINDINGS: Unchanged cardiomegaly. Pulmonary vascular congestion and mild diffuse interstitial thickening. No focal consolidation, pleural effusion, or pneumothorax. No acute osseous abnormality. IMPRESSION: Mild congestive heart failure. Electronically Signed   By:  Titus Dubin M.D.   On: 01/23/2019 10:02    Anti-infectives: Anti-infectives (From admission, onward)   Start     Dose/Rate Route Frequency Ordered Stop   01/22/19 2119  cefTRIAXone (ROCEPHIN) 2 g in sodium chloride 0.9 % 100 mL IVPB  Status:  Discontinued     2 g 200 mL/hr over 30 Minutes Intravenous Every 24 hours 01/22/19 2119 01/22/19 2122      Assessment/Plan:  Chronic calculus cholecystitis - worsening biliary colic Plan laparoscopic cholecystectomy with cholangiogram tomorrow.  If he does well, probable discharge Thursday.  LOS: 1 day    Tony Jones 01/23/2019

## 2019-01-24 ENCOUNTER — Inpatient Hospital Stay (HOSPITAL_COMMUNITY): Payer: Medicare (Managed Care)

## 2019-01-24 ENCOUNTER — Inpatient Hospital Stay (HOSPITAL_COMMUNITY): Payer: Medicare (Managed Care) | Admitting: Vascular Surgery

## 2019-01-24 ENCOUNTER — Encounter (HOSPITAL_COMMUNITY)
Admission: AD | Disposition: A | Discharge status: Home / Self Care | Payer: Self-pay | Source: Home / Self Care | Attending: Surgery

## 2019-01-24 ENCOUNTER — Other Ambulatory Visit: Payer: Self-pay

## 2019-01-24 ENCOUNTER — Encounter (HOSPITAL_COMMUNITY): Payer: Self-pay | Admitting: General Practice

## 2019-01-24 DIAGNOSIS — I2781 Cor pulmonale (chronic): Secondary | ICD-10-CM

## 2019-01-24 DIAGNOSIS — K8010 Calculus of gallbladder with chronic cholecystitis without obstruction: Secondary | ICD-10-CM

## 2019-01-24 HISTORY — PX: CHOLECYSTECTOMY: SHX55

## 2019-01-24 LAB — POCT I-STAT 7, (LYTES, BLD GAS, ICA,H+H)
Acid-Base Excess: 3 mmol/L — ABNORMAL HIGH (ref 0.0–2.0)
Bicarbonate: 28.8 mmol/L — ABNORMAL HIGH (ref 20.0–28.0)
Calcium, Ion: 1.11 mmol/L — ABNORMAL LOW (ref 1.15–1.40)
HCT: 37 % — ABNORMAL LOW (ref 39.0–52.0)
Hemoglobin: 12.6 g/dL — ABNORMAL LOW (ref 13.0–17.0)
O2 Saturation: 96 %
Potassium: 3.7 mmol/L (ref 3.5–5.1)
Sodium: 139 mmol/L (ref 135–145)
TCO2: 30 mmol/L (ref 22–32)
pCO2 arterial: 50.1 mmHg — ABNORMAL HIGH (ref 32.0–48.0)
pH, Arterial: 7.368 (ref 7.350–7.450)
pO2, Arterial: 87 mmHg (ref 83.0–108.0)

## 2019-01-24 LAB — GLUCOSE, CAPILLARY: Glucose-Capillary: 196 mg/dL — ABNORMAL HIGH (ref 70–99)

## 2019-01-24 SURGERY — LAPAROSCOPIC CHOLECYSTECTOMY WITH INTRAOPERATIVE CHOLANGIOGRAM
Anesthesia: General | Site: Abdomen

## 2019-01-24 MED ORDER — SUCCINYLCHOLINE CHLORIDE 200 MG/10ML IV SOSY
PREFILLED_SYRINGE | INTRAVENOUS | Status: DC | PRN
  Administered 2019-01-24: 120 mg via INTRAVENOUS

## 2019-01-24 MED ORDER — DEXAMETHASONE SODIUM PHOSPHATE 10 MG/ML IJ SOLN
INTRAMUSCULAR | Status: DC | PRN
  Administered 2019-01-24: 10 mg via INTRAVENOUS

## 2019-01-24 MED ORDER — INSULIN ASPART 100 UNIT/ML ~~LOC~~ SOLN: 5.0000 [IU] | Freq: Once | SUBCUTANEOUS | Status: DC

## 2019-01-24 MED ORDER — ROCURONIUM BROMIDE 10 MG/ML (PF) SYRINGE
PREFILLED_SYRINGE | INTRAVENOUS | Status: DC | PRN
  Administered 2019-01-24: 20 mg via INTRAVENOUS
  Administered 2019-01-24: 60 mg via INTRAVENOUS

## 2019-01-24 MED ORDER — SUGAMMADEX SODIUM 200 MG/2ML IV SOLN
INTRAVENOUS | Status: DC | PRN
  Administered 2019-01-24: 400 mg via INTRAVENOUS

## 2019-01-24 MED ORDER — INSULIN ASPART 100 UNIT/ML ~~LOC~~ SOLN
SUBCUTANEOUS | Status: AC
  Administered 2019-01-24: 5 [IU]
  Filled 2019-01-24: qty 1

## 2019-01-24 MED ORDER — 0.9 % SODIUM CHLORIDE (POUR BTL) OPTIME
TOPICAL | Status: DC | PRN
  Administered 2019-01-24: 15:00:00 1000 mL

## 2019-01-24 MED ORDER — PHENYLEPHRINE 40 MCG/ML (10ML) SYRINGE FOR IV PUSH (FOR BLOOD PRESSURE SUPPORT)
PREFILLED_SYRINGE | INTRAVENOUS | Status: DC | PRN
  Administered 2019-01-24 (×2): 200 ug via INTRAVENOUS

## 2019-01-24 MED ORDER — FENTANYL CITRATE (PF) 250 MCG/5ML IJ SOLN
INTRAMUSCULAR | Status: DC | PRN
  Administered 2019-01-24: 100 ug via INTRAVENOUS

## 2019-01-24 MED ORDER — SODIUM CHLORIDE 0.9 % IR SOLN
Status: DC | PRN
  Administered 2019-01-24: 1000 mL

## 2019-01-24 MED ORDER — LIDOCAINE 2% (20 MG/ML) 5 ML SYRINGE
INTRAMUSCULAR | Status: DC | PRN
  Administered 2019-01-24: 30 mg via INTRAVENOUS

## 2019-01-24 MED ORDER — PROPOFOL 10 MG/ML IV BOLUS
INTRAVENOUS | Status: DC | PRN
  Administered 2019-01-24: 90 mg via INTRAVENOUS

## 2019-01-24 MED ORDER — ONDANSETRON HCL 4 MG/2ML IJ SOLN
INTRAMUSCULAR | Status: DC | PRN
  Administered 2019-01-24: 4 mg via INTRAVENOUS

## 2019-01-24 MED ORDER — HEMOSTATIC AGENTS (NO CHARGE) OPTIME
TOPICAL | Status: DC | PRN
  Administered 2019-01-24: 1

## 2019-01-24 MED ORDER — OXYCODONE HCL 5 MG PO TABS
5.0000 mg | ORAL_TABLET | Freq: Four times a day (QID) | ORAL | Status: DC | PRN
  Filled 2019-01-24: qty 1

## 2019-01-24 MED ORDER — BUPIVACAINE-EPINEPHRINE 0.25% -1:200000 IJ SOLN
INTRAMUSCULAR | Status: DC | PRN
  Administered 2019-01-24: 11 mL

## 2019-01-24 MED ORDER — BUPIVACAINE-EPINEPHRINE (PF) 0.25% -1:200000 IJ SOLN
INTRAMUSCULAR | Status: AC
  Filled 2019-01-24: qty 30

## 2019-01-24 MED ORDER — SODIUM CHLORIDE 0.9 % IV SOLN
INTRAVENOUS | Status: DC | PRN
  Administered 2019-01-24: 16:00:00 50 mL

## 2019-01-24 MED ORDER — LACTATED RINGERS IV SOLN
INTRAVENOUS | Status: DC
  Administered 2019-01-24 (×2): via INTRAVENOUS

## 2019-01-24 MED ORDER — MIDAZOLAM HCL 2 MG/2ML IJ SOLN
INTRAMUSCULAR | Status: DC | PRN
  Administered 2019-01-24: 2 mg via INTRAVENOUS

## 2019-01-24 MED ORDER — MORPHINE SULFATE (PF) 2 MG/ML IV SOLN
2.0000 mg | INTRAVENOUS | Status: DC | PRN
  Administered 2019-01-25: 2 mg via INTRAVENOUS
  Filled 2019-01-24: qty 1

## 2019-01-24 SURGICAL SUPPLY — 49 items
ADH SKN CLS LQ APL DERMABOND (GAUZE/BANDAGES/DRESSINGS) ×1
APL PRP STRL LF DISP 70% ISPRP (MISCELLANEOUS) ×1
APL SKNCLS STERI-STRIP NONHPOA (GAUZE/BANDAGES/DRESSINGS) ×1
APPLIER CLIP ROT 10 11.4 M/L (STAPLE) ×2
APR CLP MED LRG 11.4X10 (STAPLE) ×1
BENZOIN TINCTURE PRP APPL 2/3 (GAUZE/BANDAGES/DRESSINGS) ×2 IMPLANT
BLADE CLIPPER SURG (BLADE) ×1 IMPLANT
CANISTER SUCT 3000ML PPV (MISCELLANEOUS) ×2 IMPLANT
CHLORAPREP W/TINT 26 (MISCELLANEOUS) ×2 IMPLANT
CLIP APPLIE ROT 10 11.4 M/L (STAPLE) ×1 IMPLANT
COVER MAYO STAND STRL (DRAPES) ×2 IMPLANT
COVER SURGICAL LIGHT HANDLE (MISCELLANEOUS) ×2 IMPLANT
COVER WAND RF STERILE (DRAPES) ×2 IMPLANT
DECANTER SPIKE VIAL GLASS SM (MISCELLANEOUS) ×1 IMPLANT
DERMABOND ADHESIVE PROPEN (GAUZE/BANDAGES/DRESSINGS) ×1
DERMABOND ADVANCED .7 DNX6 (GAUZE/BANDAGES/DRESSINGS) IMPLANT
DRAPE C-ARM 42X72 X-RAY (DRAPES) ×2 IMPLANT
DRSG TEGADERM 2-3/8X2-3/4 SM (GAUZE/BANDAGES/DRESSINGS) ×4 IMPLANT
DRSG TEGADERM 4X4.75 (GAUZE/BANDAGES/DRESSINGS) ×2 IMPLANT
ELECT REM PT RETURN 9FT ADLT (ELECTROSURGICAL) ×2
ELECTRODE REM PT RTRN 9FT ADLT (ELECTROSURGICAL) ×1 IMPLANT
GAUZE SPONGE 2X2 8PLY STRL LF (GAUZE/BANDAGES/DRESSINGS) ×1 IMPLANT
GLOVE BIO SURGEON STRL SZ7 (GLOVE) ×2 IMPLANT
GLOVE BIOGEL PI IND STRL 7.5 (GLOVE) ×1 IMPLANT
GLOVE BIOGEL PI INDICATOR 7.5 (GLOVE) ×1
GOWN STRL REUS W/ TWL LRG LVL3 (GOWN DISPOSABLE) ×3 IMPLANT
GOWN STRL REUS W/TWL LRG LVL3 (GOWN DISPOSABLE) ×6
KIT BASIN OR (CUSTOM PROCEDURE TRAY) ×2 IMPLANT
KIT TURNOVER KIT B (KITS) ×2 IMPLANT
NS IRRIG 1000ML POUR BTL (IV SOLUTION) ×2 IMPLANT
PAD ARMBOARD 7.5X6 YLW CONV (MISCELLANEOUS) ×2 IMPLANT
POUCH RETRIEVAL MASTER ESAC 10 (ENDOMECHANICALS) ×1 IMPLANT
SCISSORS LAP 5X35 DISP (ENDOMECHANICALS) ×2 IMPLANT
SET CHOLANGIOGRAPH 5 50 .035 (SET/KITS/TRAYS/PACK) ×2 IMPLANT
SET IRRIG TUBING LAPAROSCOPIC (IRRIGATION / IRRIGATOR) ×2 IMPLANT
SET TUBE SMOKE EVAC HIGH FLOW (TUBING) ×2 IMPLANT
SLEEVE ENDOPATH XCEL 5M (ENDOMECHANICALS) ×2 IMPLANT
SPECIMEN JAR SMALL (MISCELLANEOUS) ×2 IMPLANT
SPONGE GAUZE 2X2 STER 10/PKG (GAUZE/BANDAGES/DRESSINGS) ×1
STRIP CLOSURE SKIN 1/2X4 (GAUZE/BANDAGES/DRESSINGS) ×2 IMPLANT
SURGICEL SNOW 2X4 (HEMOSTASIS) ×1 IMPLANT
SUT MNCRL AB 4-0 PS2 18 (SUTURE) ×3 IMPLANT
TOWEL GREEN STERILE (TOWEL DISPOSABLE) ×2 IMPLANT
TOWEL GREEN STERILE FF (TOWEL DISPOSABLE) ×2 IMPLANT
TRAY LAPAROSCOPIC MC (CUSTOM PROCEDURE TRAY) ×2 IMPLANT
TROCAR XCEL BLUNT TIP 100MML (ENDOMECHANICALS) ×2 IMPLANT
TROCAR XCEL NON-BLD 11X100MML (ENDOMECHANICALS) ×2 IMPLANT
TROCAR XCEL NON-BLD 5MMX100MML (ENDOMECHANICALS) ×2 IMPLANT
WATER STERILE IRR 1000ML POUR (IV SOLUTION) ×2 IMPLANT

## 2019-01-24 NOTE — Progress Notes (Signed)
Patient ID: Tony Jones, male   DOB: 1961/03/11, 58 y.o.   MRN: [id number]  Patient stable overnight Prepared for lap chole later today.  [name]. [name], MD, [facility]  General/ Trauma Surgery Beeper [phone]  01/24/2019 9:36 AM

## 2019-01-24 NOTE — Anesthesia Procedure Notes (Signed)
Procedure Name: Intubation Date/Time: 01/24/2019 4:04 PM Performed by: Teressa Lower., CRNA Pre-anesthesia Checklist: Patient identified, Emergency Drugs available, Suction available and Patient being monitored Patient Re-evaluated:Patient Re-evaluated prior to induction Oxygen Delivery Method: Circle system utilized Preoxygenation: Pre-oxygenation with 100% oxygen Induction Type: IV induction and Rapid sequence Laryngoscope Size: Glidescope and 4 Grade View: Grade I Tube type: Oral Tube size: 7.5 mm Number of attempts: 1 Airway Equipment and Method: Stylet and Video-laryngoscopy Placement Confirmation: ETT inserted through vocal cords under direct vision,  positive ETCO2 and breath sounds checked- equal and bilateral Secured at: 23 cm Tube secured with: Tape Dental Injury: Teeth and Oropharynx as per pre-operative assessment

## 2019-01-24 NOTE — Op Note (Signed)
Laparoscopic Cholecystectomy with IOC Procedure Note  Indications: This patient presents with symptomatic gallbladder disease and will undergo laparoscopic cholecystectomy.  He has pulmonary hypertension and right heart failure, but has been optimized by Cardiology and is cleared for surgery.  Pre-operative Diagnosis: Calculus of gallbladder with other cholecystitis, without mention of obstruction  Post-operative Diagnosis: Calculus of gallbladder with other cholecystitis, without mention of obstruction  Surgeon: Maia Petties   Assistants: Kieth Brightly, MD  Anesthesia: General endotracheal anesthesia  ASA Class: 3  Procedure Details  The patient was seen again in the Holding Room. The risks, benefits, complications, treatment options, and expected outcomes were discussed with the patient. The possibilities of reaction to medication, pulmonary aspiration, perforation of viscus, bleeding, recurrent infection, finding a normal gallbladder, the need for additional procedures, failure to diagnose a condition, the possible need to convert to an open procedure, and creating a complication requiring transfusion or operation were discussed with the patient. The likelihood of improving the patient's symptoms with return to their baseline status is good.  The patient and/or family concurred with the proposed plan, giving informed consent. The site of surgery properly noted. The patient was taken to Operating Room, identified as [name] and the procedure verified as Laparoscopic Cholecystectomy with Intraoperative Cholangiogram. A Time Out was held and the above information confirmed.  Prior to the induction of general anesthesia, antibiotic prophylaxis was administered. General endotracheal anesthesia was then administered and tolerated well. After the induction, the abdomen was prepped with Chloraprep and draped in the sterile fashion. The patient was positioned in the supine position.  Local  anesthetic agent was injected into the skin near the umbilicus and an incision made. We dissected down to the abdominal fascia with blunt dissection.  The fascia was incised vertically and we entered the peritoneal cavity bluntly.  A pursestring suture of 0-Vicryl was placed around the fascial opening.  The Hasson cannula was inserted and secured with the stay suture.  Pneumoperitoneum was then created with CO2 and tolerated well without any adverse changes in the patient's vital signs. An 11-mm port was placed in the subxiphoid position.  Two 5-mm ports were placed in the right upper quadrant. All skin incisions were infiltrated with a local anesthetic agent before making the incision and placing the trocars.   We positioned the patient in reverse Trendelenburg, tilted slightly to the patient's left.  The gallbladder was identified, the fundus grasped and retracted cephalad.  There are some adhesions to the gallbladder and the gallbladder wall seems chronically thickened. Adhesions were lysed bluntly and with the electrocautery where indicated, taking care not to injure any adjacent organs or viscus. The infundibulum was grasped and retracted laterally, exposing the peritoneum overlying the triangle of Calot. This was then divided and exposed in a blunt fashion. A critical view of the cystic duct and cystic artery was obtained.  The cystic duct was clearly identified and bluntly dissected circumferentially. The cystic duct was ligated with a clip distally.   An incision was made in the cystic duct and the [facility] cholangiogram catheter introduced. The catheter was secured using a clip. A cholangiogram was then obtained which showed good visualization of the distal and proximal biliary tree with no sign of filling defects or obstruction.  Contrast flowed easily into the duodenum. The catheter was then removed.   The cystic duct was then ligated with clips and divided. The cystic artery was identified, dissected  free, ligated with clips and divided as well.   The  gallbladder was dissected from the liver bed in retrograde fashion with the electrocautery. We encountered several bleeding collateral vessels in the GB fossa that are likely secondary to his pulmonary hypertension.  These were controlled with clips and cautery.  The gallbladder was removed and placed in an Eco sac. The liver bed was irrigated and inspected. Hemostasis was achieved with the electrocautery and Surgicel SNOW. Copious irrigation was utilized and was repeatedly aspirated until clear.  The gallbladder and Endocatch sac were then removed through the umbilical port site.  The pursestring suture was used to close the umbilical fascia.    We again inspected the right upper quadrant for hemostasis.  Pneumoperitoneum was released as we removed the trocars.  4-0 Monocryl was used to close the skin.   Benzoin, steri-strips, and clean dressings were applied. The patient was then extubated and brought to the recovery room in stable condition. Instrument, sponge, and needle counts were correct at closure and at the conclusion of the case.   Findings: Cholecystitis with Cholelithiasis  Estimated Blood Loss: less than 100 mL         Drains: none         Specimens: Gallbladder           Complications: None; patient tolerated the procedure well.         Disposition: PACU - hemodynamically stable.         Condition: stable  Tony Jones. [name], MD, [facility]  General/ Trauma Surgery Beeper [phone]  01/24/2019 4:36 PM

## 2019-01-24 NOTE — Progress Notes (Signed)
Patient ID: Tony Jones, male   DOB: Jan 15, 1961, 58 y.o.   MRN: [id number]     Advanced Heart Failure Rounding Note  PCP-Cardiologist: Glenetta Hew, MD   Subjective:    No RUQ pain currently.  Only complaint is feeling cold.  Not on oxygen.    Objective:   Weight Range: 84.9 kg Body mass index is 35.37 kg/m.   Vital Signs:   Temp:  [97.8 F (36.6 C)-98.6 F (37 C)] 98.6 F (37 C) (09/23 0503) Pulse Rate:  [73-82] 82 (09/23 0503) Resp:  [16-18] 18 (09/23 0503) BP: (112-120)/(69-85) 114/85 (09/23 0503) SpO2:  [91 %-99 %] 99 % (09/23 0503) Last BM Date: 01/22/19  Weight change: Filed Weights   01/22/19 2100  Weight: 84.9 kg    Intake/Output:   Intake/Output Summary (Last 24 hours) at 01/24/2019 1036 Last data filed at 01/24/2019 0507 Gross per 24 hour  Intake 240 ml  Output 1975 ml  Net -1735 ml      Physical Exam    General:  Well appearing. No resp difficulty HEENT: Normal Neck: Thick, JVP difficult. Carotids 2+ bilat; no bruits. No lymphadenopathy or thyromegaly appreciated. Cor: PMI nondisplaced. Regular rate & rhythm. No rubs, gallops or murmurs. Lungs: Clear Abdomen: Soft, nontender, nondistended. No hepatosplenomegaly. No bruits or masses. Good bowel sounds. Extremities: No cyanosis, clubbing, rash, edema Neuro: Alert & orientedx3, cranial nerves grossly intact. moves all 4 extremities w/o difficulty. Affect pleasant   Telemetry   Not on telemetry.   Labs    CBC Recent Labs    01/22/19 2138  WBC 8.7  HGB 13.7  HCT 38.1*  MCV 88.6  PLT [id number]*   Basic Metabolic Panel Recent Labs    01/22/19 2138  NA 139  K 3.9  CL 99  CO2 29  GLUCOSE 224*  BUN 26*  CREATININE 1.99*  CALCIUM 8.7*   Liver Function Tests Recent Labs    01/22/19 2138  AST 18  ALT 13  ALKPHOS 118  BILITOT 0.4  PROT 6.9  ALBUMIN 3.4*   No results for input(s): LIPASE, AMYLASE in the last 72 hours. Cardiac Enzymes No results for input(s): CKTOTAL, CKMB,  CKMBINDEX, TROPONINI in the last 72 hours.  BNP: BNP (last 3 results) Recent Labs    01/22/19 2138  BNP 10.2    ProBNP (last 3 results) No results for input(s): PROBNP in the last 8760 hours.   D-Dimer No results for input(s): DDIMER in the last 72 hours. Hemoglobin A1C No results for input(s): HGBA1C in the last 72 hours. Fasting Lipid Panel No results for input(s): CHOL, HDL, LDLCALC, TRIG, CHOLHDL, LDLDIRECT in the last 72 hours. Thyroid Function Tests No results for input(s): TSH, T4TOTAL, T3FREE, THYROIDAB in the last 72 hours.  Invalid input(s): FREET3  Other results:   Imaging     No results found.   Medications:     Scheduled Medications:  allopurinol  300 mg Oral Daily   atorvastatin  40 mg Oral q1800   citalopram  20 mg Oral Daily   enoxaparin (LOVENOX) injection  40 mg Subcutaneous Q24H   fluticasone  1 spray Each Nare Daily   loratadine  10 mg Oral QODAY   midodrine  5 mg Oral TID WC   pantoprazole  40 mg Oral BID WC   potassium chloride SA  20 mEq Oral Daily   senna-docusate  2 tablet Oral QHS   torsemide  20 mg Oral BID   triamcinolone cream  1 application  Topical BID     Infusions:  sodium chloride 50 mL/hr at 01/23/19 1749    ceFAZolin (ANCEF) IV       PRN Medications:  acetaminophen **OR** acetaminophen, diphenhydrAMINE **OR** diphenhydrAMINE, docusate sodium, LORazepam, morphine injection, ondansetron **OR** ondansetron (ZOFRAN) IV, polyvinyl alcohol, triamcinolone cream   Assessment/Plan   1. Pulmonary hypertension with RV failure/cor pulmonale: Suspect that the pulmonary hypertension is a combination of group 2 (diastolic CHF) and primarily group 3 PH (OHS/OSA). He cannot tolerate CPAP. High resolution CT chest was not suggestive of ILD. V/Q scan did not show chronic PEs in 2016. Volume status stable on exam and weight has stable. Most recent echo in 7/20 showed improvement in RV => now only mildly dilated with  normal systolic function.  - Continue oxygen while sleeping, has not needed during the day recently.  - As PH appears to be group 2 and group 3 (unlikely group 1), think it is reasonable to hold off on selective pulmonary vasodilators. Treatment will be oxygen and diuresis (he cannot tolerate CPAP).  - He is on midodrine 5 mg TID to keep BP up, continue.  2. Chronic diastolic CHF: In addition to RV failure, lastRHC showed elevated PCWP. Last echo in 7/20 showed EF 55-60% with improved RV function.   Weight has been stable, doubt significant volume overload though exam is difficult.  - Continue torsemide 20 mg bid as at home.  Watch closely peri-operatively for development of volume overload.  - Would keep on telemetry with CHF.  3. CKD: Stage 3. Baseline around 2.  4. GI bleeding: Capsule negative in 6/19.No recent overt GI bleeding. 5. Cholelithiasis: Admitted 12/21/17 with RUQ pain and N/V. ABD US showed cholelithiasis without evidence for cholecystitis. He improved with conservative therapy, and was also thought to overall be a very poor surgical candidate, possibly prohibitive by both HF and surgical teams. He did ok for a while on ursodiol.  However, recently he has developed worsening gallbladder colic.  He is now in the hospital planned for lap cholecystectomy today.  His mother does not think that he could manage a cholecystostomy tube.   - I think that he is of reasonable risk to undergo surgery, probably at moderate risk of peri-operative pulmonary or cardiac complications.  Will follow closely.  6. 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  - Continue atorvastatin 40 mg daily.  - Hold off on ASA 81 with GI bleedinghistory.  Length of Stay: 2  Loralie Champagne, MD  01/24/2019, 10:36 AM  Advanced Heart Failure Team Pager [phone] (M-F; 7a - 4p)  Please contact [facility] for night-coverage after hours (4p -7a ) and weekends on [email or web address]

## 2019-01-24 NOTE — Progress Notes (Signed)
Anesthesiology note:  Tony Jones is a 58 year old male with developmental delay and a history of right heart failure and cor pulmonale presumably due to to chronic hypoxemia and sleep apnea.  He was hospitalized from 09/02/2017-10/13/2017 with failure to thrive and severe right heart failure.  He was noted to have severe pulmonary hypertension with a PA pressure of 93/51 with interventricular septal flattening and RV failure on echocardiogram.  Since his hospitalization he has improved clinically and is on home O2 but cannot tolerate CPAP.  His last echocardiogram 11/23/18 showed normal right ventricular systolic function and normal right ventricular cavity size and LV ejection fraction of 55 to 60%.    Today he had no peripheral edema on exam and his lungs were clear.  He underwent laparoscopic cholecystectomy under general anesthesia.  He tolerated general anesthesia well and remained hemodynamically stable throughout the procedure. No inotropic support was required.  ABG during CO2 insufflation and on 50% oxygen showed pH 7.37 PCO2 50 PO2 87.  He was subsequently extubated without difficulty and taken to recovery room in remaining stable condition.  He is now resting in the recovery room and denies pain.  VS: T-37.0 BP- 120/78 HR- 92 RR- 17 O2 sat 94% on 3L [place]  Roberts Gaudy, MD

## 2019-01-24 NOTE — Anesthesia Procedure Notes (Signed)
Arterial Line Insertion Start/End9/23/2020 3:35 PM, 01/24/2019 3:40 PM Performed by: Teressa Lower., CRNA, CRNA  Preanesthetic checklist: patient identified, IV checked, site marked, risks and benefits discussed, surgical consent, monitors and equipment checked, pre-op evaluation, timeout performed and anesthesia consent Left, radial was placed Catheter size: 20 G Hand hygiene performed , maximum sterile barriers used  and Seldinger technique used Allen's test indicative of satisfactory collateral circulation Attempts: 1 Procedure performed without using ultrasound guided technique. Following insertion, Biopatch and dressing applied. Post procedure assessment: normal  Patient tolerated the procedure well with no immediate complications.

## 2019-01-24 NOTE — Transfer of Care (Signed)
Immediate Anesthesia Transfer of Care Note  Patient: Tony Jones  Procedure(s) Performed: LAPAROSCOPIC CHOLECYSTECTOMY WITH INTRAOPERATIVE CHOLANGIOGRAM (N/A Abdomen)  Patient Location: PACU  Anesthesia Type:General  Level of Consciousness: awake, alert  and oriented  Airway & Oxygen Therapy: Patient Spontanous Breathing and Patient connected to face mask oxygen  Post-op Assessment: Report given to RN and Post -op Vital signs reviewed and stable  Post vital signs: Reviewed and stable  Last Vitals:  Vitals Value Taken Time  BP 134/79 01/24/19 1702  Temp    Pulse 91 01/24/19 1702  Resp 15 01/24/19 1702  SpO2 97 % 01/24/19 1702  Vitals shown include unvalidated device data.  Last Pain:  Vitals:   01/24/19 1348  TempSrc: Oral  PainSc:          Complications: No apparent anesthesia complications

## 2019-01-24 NOTE — Anesthesia Procedure Notes (Signed)
Central Venous Catheter Insertion Performed by: Roberts Gaudy, MD, anesthesiologist Start/End9/23/2020 3:25 PM, 01/24/2019 3:35 PM Patient location: OR. Preanesthetic checklist: patient identified, IV checked, site marked, risks and benefits discussed, surgical consent, monitors and equipment checked, pre-op evaluation, timeout performed and anesthesia consent Position: Trendelenburg Lidocaine 1% used for infiltration and patient sedated Hand hygiene performed , maximum sterile barriers used  and Seldinger technique used Catheter size: 8 Fr Total catheter length 16. Central line was placed.Double lumen Procedure performed using ultrasound guided technique. Ultrasound Notes:anatomy identified, needle tip was noted to be adjacent to the nerve/plexus identified, no ultrasound evidence of intravascular and/or intraneural injection and image(s) printed for medical record Attempts: 1 Following insertion, dressing applied, line sutured and Biopatch. Post procedure assessment: blood return through all ports  Patient tolerated the procedure well with no immediate complications.

## 2019-01-25 ENCOUNTER — Encounter (HOSPITAL_COMMUNITY): Payer: Self-pay | Admitting: Surgery

## 2019-01-25 DIAGNOSIS — I5032 Chronic diastolic (congestive) heart failure: Secondary | ICD-10-CM

## 2019-01-25 LAB — CBC
HCT: 39.1 % (ref 39.0–52.0)
HCT: 39 % (ref 39.0–52.0)
Hemoglobin: 13.6 g/dL (ref 13.0–17.0)
Hemoglobin: 13.3 g/dL (ref 13.0–17.0)
MCH: 31.6 pg (ref 26.0–34.0)
MCH: 31 pg (ref 26.0–34.0)
MCHC: 34.8 g/dL (ref 30.0–36.0)
MCHC: 34.1 g/dL (ref 30.0–36.0)
MCV: 90.7 fL (ref 80.0–100.0)
MCV: 90.9 fL (ref 80.0–100.0)
Platelets: 139 10*3/uL — ABNORMAL LOW (ref 150–400)
Platelets: 139 10*3/uL — ABNORMAL LOW (ref 150–400)
RBC: 4.31 MIL/uL (ref 4.22–5.81)
RBC: 4.29 MIL/uL (ref 4.22–5.81)
RDW: 14.1 % (ref 11.5–15.5)
RDW: 14 % (ref 11.5–15.5)
WBC: 12.1 10*3/uL — ABNORMAL HIGH (ref 4.0–10.5)
WBC: 12.7 10*3/uL — ABNORMAL HIGH (ref 4.0–10.5)
nRBC: 0 % (ref 0.0–0.2)
nRBC: 0 % (ref 0.0–0.2)

## 2019-01-25 LAB — COMPREHENSIVE METABOLIC PANEL
ALT: 32 U/L (ref 0–44)
AST: 47 U/L — ABNORMAL HIGH (ref 15–41)
Albumin: 3.4 g/dL — ABNORMAL LOW (ref 3.5–5.0)
Alkaline Phosphatase: 118 U/L (ref 38–126)
Anion gap: 10 (ref 5–15)
BUN: 21 mg/dL — ABNORMAL HIGH (ref 6–20)
CO2: 30 mmol/L (ref 22–32)
Calcium: 8.6 mg/dL — ABNORMAL LOW (ref 8.9–10.3)
Chloride: 98 mmol/L (ref 98–111)
Creatinine, Ser: 2.01 mg/dL — ABNORMAL HIGH (ref 0.61–1.24)
GFR calc Af Amer: 41 mL/min — ABNORMAL LOW (ref >60)
GFR calc non Af Amer: 36 mL/min — ABNORMAL LOW (ref >60)
Glucose, Bld: 238 mg/dL — ABNORMAL HIGH (ref 70–99)
Potassium: 4.7 mmol/L (ref 3.5–5.1)
Sodium: 138 mmol/L (ref 135–145)
Total Bilirubin: 0.5 mg/dL (ref 0.3–1.2)
Total Protein: 6.6 g/dL (ref 6.5–8.1)

## 2019-01-25 LAB — CREATININE, SERUM
Creatinine, Ser: 1.92 mg/dL — ABNORMAL HIGH (ref 0.61–1.24)
GFR calc Af Amer: 44 mL/min — ABNORMAL LOW (ref >60)
GFR calc non Af Amer: 38 mL/min — ABNORMAL LOW (ref >60)

## 2019-01-25 MED ORDER — ENOXAPARIN SODIUM 40 MG/0.4ML ~~LOC~~ SOLN: 40.0000 mg | SUBCUTANEOUS | Status: DC

## 2019-01-25 MED ORDER — ENOXAPARIN SODIUM 30 MG/0.3ML ~~LOC~~ SOLN
30.0000 mg | SUBCUTANEOUS | Status: DC
  Administered 2019-01-25: 30 mg via SUBCUTANEOUS
  Filled 2019-01-25: qty 0.3

## 2019-01-25 MED ORDER — CHLORHEXIDINE GLUCONATE CLOTH 2 % EX PADS: 6.0000 | MEDICATED_PAD | Freq: Every day | CUTANEOUS | Status: DC

## 2019-01-25 NOTE — Discharge Instructions (Signed)
[facility] ______[facility] °LAPAROSCOPIC SURGERY: POST OP INSTRUCTIONS °Always review your discharge instruction sheet given to you by the facility where your surgery was performed. °IF YOU HAVE DISABILITY OR FAMILY LEAVE FORMS, YOU MUST BRING THEM TO THE OFFICE FOR PROCESSING.   °DO NOT GIVE THEM TO YOUR DOCTOR. ° °1. A prescription for pain medication may be given to you upon discharge.  Take your pain medication as prescribed, if needed.  If narcotic pain medicine is not needed, then you may take acetaminophen (Tylenol) or ibuprofen (Advil) as needed. °2. Take your usually prescribed medications unless otherwise directed. °3. If you need a refill on your pain medication, please contact your pharmacy.  They will contact our office to request authorization. Prescriptions will not be filled after 5pm or on week-ends. °4. You should follow a light diet the first few days after arrival home, such as soup and crackers, etc.  Be sure to include lots of fluids daily. °5. Most patients will experience some swelling and bruising in the area of the incisions.  Ice packs will help.  Swelling and bruising can take several days to resolve.  °6. It is common to experience some constipation if taking pain medication after surgery.  Increasing fluid intake and taking a stool softener (such as Colace) will usually help or prevent this problem from occurring.  A mild laxative (Milk of Magnesia or Miralax) should be taken according to package instructions if there are no bowel movements after 48 hours. °7. Unless discharge instructions indicate otherwise, you may remove your bandages 24-48 hours after surgery, and you may shower at that time.  You may have steri-strips (small skin tapes) in place directly over the incision.  These strips should be left on the skin for 7-10 days.  If your surgeon used skin glue on the incision, you may shower in 24 hours.  The glue will flake off over the next 2-3 weeks.  Any sutures or  staples will be removed at the office during your follow-up visit. °8. ACTIVITIES:  You may resume regular (light) daily activities beginning the next day--such as daily self-care, walking, climbing stairs--gradually increasing activities as tolerated.  You may have sexual intercourse when it is comfortable.  Refrain from any heavy lifting or straining until approved by your doctor. °a. You may drive when you are no longer taking prescription pain medication, you can comfortably wear a seatbelt, and you can safely maneuver your car and apply brakes. °b. RETURN TO WORK:  __________________________________________________________ °9. You should see your doctor in the office for a follow-up appointment approximately 2-3 weeks after your surgery.  Make sure that you call for this appointment within a day or two after you arrive home to insure a convenient appointment time. °10. OTHER INSTRUCTIONS: __________________________________________________________________________________________________________________________ __________________________________________________________________________________________________________________________ °WHEN TO CALL YOUR DOCTOR: °1. Fever over 101.0 °2. Inability to urinate °3. Continued bleeding from incision. °4. Increased pain, redness, or drainage from the incision. °5. Increasing abdominal pain ° °The clinic staff is available to answer your questions during regular business hours.  Please don’t hesitate to call and ask to speak to one of the nurses for clinical concerns.  If you have a medical emergency, go to the nearest emergency room or call 911.  A surgeon from [facility] is always on call at the hospital. °[address], [address], [place], [place]  [address] ? [address] [address], [place], [place]   [address] °[phone] ? [phone] ? FAX [phone] °Web site:   [email or web address] °

## 2019-01-25 NOTE — Progress Notes (Signed)
1 Day Post-Op   Subjective/Chief Complaint: Patient gave me the "thumbs up" when I asked how he was feeling Waiting on breakfast - hungry Used pain meds only once last night   Objective: Vital signs in last 24 hours: Temp:  [97.8 F (36.6 C)-99.3 F (37.4 C)] 98.5 F (36.9 C) (09/24 0517) Pulse Rate:  [83-102] 98 (09/24 0517) Resp:  [15-20] 18 (09/24 0517) BP: (118-134)/(78-98) 118/82 (09/24 0517) SpO2:  [93 %-97 %] 96 % (09/24 0517) Weight:  [84.9 kg] 84.9 kg (09/23 1437) Last BM Date: 01/24/19  Intake/Output from previous day: 09/23 0701 - 09/24 0700 In: 800 [I.V.:800] Out: 595 [Urine:580; Blood:15] Intake/Output this shift: Total I/O In: -  Out: 100 [Urine:100]  General appearance: alert, cooperative and no distress GI: soft, minimal tenderness; incisions c/d/i  Lab Results:  Recent Labs    01/25/19 0035 01/25/19 0749  WBC 12.1* 12.7*  HGB 13.6 13.3  HCT 39.1 39.0  PLT 139* 139*   BMET Recent Labs    01/22/19 2138 01/24/19 1628 01/25/19 0035 01/25/19 0749  NA 139 139 138  --   K 3.9 3.7 4.7  --   CL 99  --  98  --   CO2 29  --  30  --   GLUCOSE 224*  --  238*  --   BUN 26*  --  21*  --   CREATININE 1.99*  --  2.01* 1.92*  CALCIUM 8.7*  --  8.6*  --    PT/INR No results for input(s): LABPROT, INR in the last 72 hours. ABG Recent Labs    01/24/19 1628  PHART 7.368  HCO3 28.8*    Studies/Results: Dg Cholangiogram Operative  Result Date: 01/24/2019 CLINICAL DATA:  58 year old male undergoing laparoscopic cholecystectomy EXAM: INTRAOPERATIVE CHOLANGIOGRAM TECHNIQUE: Cholangiographic images from the C-arm fluoroscopic device were submitted for interpretation post-operatively. Please see the procedural report for the amount of contrast and the fluoroscopy time utilized. COMPARISON:  Right upper quadrant ultrasound 11/08/2018 FINDINGS: A cine clip and single saved image are submitted for review. The images demonstrate cannulation of the cystic duct  remanent and opacification of the biliary tree. No evidence of biliary ductal dilation, stenosis, stricture, choledocholithiasis or other significant abnormality. Contrast material passes freely through the ampulla and into the duodenum. IMPRESSION: Negative intraoperative cholangiogram. Electronically Signed   By: Jacqulynn Cadet M.D.   On: 01/24/2019 16:26   Dg Chest 2v Repeat Same Day  Result Date: 01/23/2019 CLINICAL DATA:  Congestive heart failure. EXAM: CHEST - 2 VIEW SAME DAY COMPARISON:  Chest x-ray dated December 22, 2017. FINDINGS: Unchanged cardiomegaly. Pulmonary vascular congestion and mild diffuse interstitial thickening. No focal consolidation, pleural effusion, or pneumothorax. No acute osseous abnormality. IMPRESSION: Mild congestive heart failure. Electronically Signed   By: Titus Dubin M.D.   On: 01/23/2019 10:02   Dg Chest Port 1 View  Result Date: 01/24/2019 CLINICAL DATA:  Central line placement EXAM: PORTABLE CHEST 1 VIEW COMPARISON:  01/23/2019 FINDINGS: Left internal jugular central line has been placed with the tip near the confluence of the innominate veins. No pneumothorax. Cardiomegaly with vascular congestion. Increasing bibasilar opacities could reflect atelectasis or infiltrates. Small effusions. IMPRESSION: Left central line tip near the confluence of the innominate veins. No pneumothorax. Cardiomegaly, vascular congestion. Increasing bibasilar atelectasis or infiltrates. Small effusions. Electronically Signed   By: Rolm Baptise M.D.   On: 01/24/2019 19:10    Anti-infectives: Anti-infectives (From admission, onward)   Start     Dose/Rate Route  Frequency Ordered Stop   01/24/19 0700  ceFAZolin (ANCEF) IVPB 2g/100 mL premix     2 g 200 mL/hr over 30 Minutes Intravenous  Once 01/23/19 1614 01/24/19 1540   01/22/19 2119  cefTRIAXone (ROCEPHIN) 2 g in sodium chloride 0.9 % 100 mL IVPB  Status:  Discontinued     2 g 200 mL/hr over 30 Minutes Intravenous Every 24  hours 01/22/19 2119 01/22/19 2122      Assessment/Plan: s/p Procedure(s): LAPAROSCOPIC CHOLECYSTECTOMY WITH INTRAOPERATIVE CHOLANGIOGRAM (N/A) Advance diet Cardiopulmonary status seems stable.  Will reevaluate later today for possible discharge.  LOS: 3 days    Maia Petties 01/25/2019

## 2019-01-25 NOTE — Discharge Summary (Signed)
Physician Discharge Summary  Patient ID: CHANS EWERT MRN: [id number] DOB/AGE: 57-Mar-1962 57 y.o.  Admit date: 01/22/2019 Discharge date: 01/25/2019  Admission Diagnoses:  Chronic calculus cholecystitis - increasing biliary colic  Discharge Diagnoses:   Same Active Problems:   Chronic cholecystitis with calculus   Discharged Condition: good  Hospital Course: Admitted 9/21 for cardiology evaluation and optimization, as well as Anesthesia consult.  Patient felt to be acceptable risk for surgery.  He underwent laparoscopic cholecystectomy with [facility] 9/23.  Some hepatic venous congestion from pulmonary hypertension, but hemostasis obtained with cautery and SNOW.  Patient felt much better on POD #1 and is using minimal pain meds.  Hungry.  Feels better than pre-op.  Tolerating regular diet.  Consults: cardiology  Significant Diagnostic Studies: radiology: IOC - negative  Treatments: surgery: lap chole with IOC  Discharge Exam: Blood pressure 118/82, pulse 98, temperature 98.5 F (36.9 C), temperature source Oral, resp. rate 18, height 5\' 1"  (1.549 m), weight 84.9 kg, SpO2 96 %. General appearance: alert, cooperative and no distress GI: soft, minimal tenderness; incisions c/d/i  Disposition: Discharge disposition: 01-Home or Self Care       Discharge Instructions    Call MD for:  persistant nausea and vomiting   Complete by: As directed    Call MD for:  redness, tenderness, or signs of infection (pain, swelling, redness, odor or green/yellow discharge around incision site)   Complete by: As directed    Call MD for:  severe uncontrolled pain   Complete by: As directed    Call MD for:  temperature >100.4   Complete by: As directed    Diet general   Complete by: As directed    Driving Restrictions   Complete by: As directed    Do not drive while taking pain medications   Increase activity slowly   Complete by: As directed    May shower / Bathe   Complete by: As directed       Allergies as of 01/25/2019      Reactions   Codeine Nausea And Vomiting      Medication List    STOP taking these medications   ursodiol 300 MG capsule Commonly known as: ACTIGALL     TAKE these medications   acetaminophen 650 MG CR tablet Commonly known as: TYLENOL Take 1,300 mg by mouth daily as needed for pain.   allopurinol 300 MG tablet Commonly known as: ZYLOPRIM Take 300 mg by mouth daily.   atorvastatin 40 MG tablet Commonly known as: LIPITOR Take 1 tablet (40 mg total) by mouth daily at 6 PM.   citalopram 20 MG tablet Commonly known as: CELEXA Take 20 mg by mouth daily.   docusate sodium 100 MG capsule Commonly known as: COLACE Take 100 mg by mouth daily as needed for mild constipation.   fluticasone 50 MCG/ACT nasal spray Commonly known as: FLONASE Place 1 spray into both nostrils daily.   levocetirizine 5 MG tablet Commonly known as: XYZAL Take 2.5 mg by mouth every other day.   LORazepam 0.5 MG tablet Commonly known as: ATIVAN Take 0.5 mg by mouth 2 (two) times daily as needed for anxiety.   midodrine 5 MG tablet Commonly known as: PROAMATINE Take 5 mg by mouth See admin instructions. Take 1 tablet (5 mg) by mouth 3 times daily, except take an additional tablet (5 mg) by mouth at midday on center days to maintain adequate blood pressure.   midodrine 10 MG tablet Commonly known as: PROAMATINE  Take 0.5 tablets (5 mg total) by mouth 3 (three) times daily with meals.   multivitamin with minerals Tabs tablet Take 1 tablet by mouth daily with breakfast.   pantoprazole 40 MG tablet Commonly known as: PROTONIX Take 40 mg by mouth 2 (two) times daily with a meal.   potassium chloride SA 20 MEQ tablet Commonly known as: K-DUR Take 1 tablet (20 mEq total) by mouth daily.   senna-docusate 8.6-50 MG tablet Commonly known as: Senokot-S Take 2 tablets by mouth at bedtime.   SYSTANE BALANCE OP Place 1 drop into both eyes 2 (two) times daily as  needed (dry eyes).   torsemide 20 MG tablet Commonly known as: DEMADEX Take 20 mg by mouth 2 (two) times daily.   traMADol 50 MG tablet Commonly known as: ULTRAM Take 50 mg by mouth 2 (two) times daily as needed for severe pain.   triamcinolone cream 0.1 % Commonly known as: KENALOG Apply 1 application topically See admin instructions. Apply to arms daily      Follow-up Information    Donnie [place], MD Follow up in 3 week(s).   Specialty: General Surgery Contact information: [address] [place] [address] [phone]           Signed: Maia Petties 01/25/2019, 3:41 PM

## 2019-01-25 NOTE — Progress Notes (Signed)
Patient ID: Tony Jones, male   DOB: [birth year], 58 y.o.   MRN: [id number]     Advanced Heart Failure Rounding Note  PCP-Cardiologist: Glenetta Hew, MD   Subjective:    Lap cholecystectomy yesterday, no complications.  Gives a thumbs up when asked how he is doing.     Objective:   Weight Range: 84.9 kg Body mass index is 35.37 kg/m.   Vital Signs:   Temp:  [97.8 F (36.6 C)-99.3 F (37.4 C)] 98.5 F (36.9 C) (09/24 0517) Pulse Rate:  [83-102] 98 (09/24 0517) Resp:  [15-20] 18 (09/24 0517) BP: (118-134)/(78-98) 118/82 (09/24 0517) SpO2:  [93 %-97 %] 96 % (09/24 0517) Weight:  [84.9 kg] 84.9 kg (09/23 1437) Last BM Date: 01/24/19  Weight change: Filed Weights   01/22/19 2100 01/24/19 1437  Weight: 84.9 kg 84.9 kg    Intake/Output:   Intake/Output Summary (Last 24 hours) at 01/25/2019 1134 Last data filed at 01/25/2019 0908 Gross per 24 hour  Intake 800 ml  Output 695 ml  Net 105 ml      Physical Exam    General: NAD Neck: Thick, no JVD, no thyromegaly or thyroid nodule.  Lungs: Clear to auscultation bilaterally with normal respiratory effort. CV: Nondisplaced PMI.  Heart regular S1/S2, no S3/S4, 1/6 early SEM RUSB.  No peripheral edema.   Abdomen: Soft, minimally tender, no hepatosplenomegaly, no distention.  Skin: Intact without lesions or rashes.  Neurologic: Alert and oriented x 3.  Psych: Normal affect. Extremities: No clubbing or cyanosis.  HEENT: Normal.    Telemetry   NSR 100s (personally reviewed)  Labs    CBC Recent Labs    01/25/19 0035 01/25/19 0749  WBC 12.1* 12.7*  HGB 13.6 13.3  HCT 39.1 39.0  MCV 90.7 90.9  PLT 139* [id number]*   Basic Metabolic Panel Recent Labs    01/22/19 2138 01/24/19 1628 01/25/19 0035 01/25/19 0749  NA 139 139 138  --   K 3.9 3.7 4.7  --   CL 99  --  98  --   CO2 29  --  30  --   GLUCOSE 224*  --  238*  --   BUN 26*  --  21*  --   CREATININE 1.99*  --  2.01* 1.92*  CALCIUM 8.7*  --  8.6*  --     Liver Function Tests Recent Labs    01/22/19 2138 01/25/19 0035  AST 18 47*  ALT 13 32  ALKPHOS 118 118  BILITOT 0.4 0.5  PROT 6.9 6.6  ALBUMIN 3.4* 3.4*   No results for input(s): LIPASE, AMYLASE in the last 72 hours. Cardiac Enzymes No results for input(s): CKTOTAL, CKMB, CKMBINDEX, TROPONINI in the last 72 hours.  BNP: BNP (last 3 results) Recent Labs    01/22/19 2138  BNP 10.2    ProBNP (last 3 results) No results for input(s): PROBNP in the last 8760 hours.   D-Dimer No results for input(s): DDIMER in the last 72 hours. Hemoglobin A1C No results for input(s): HGBA1C in the last 72 hours. Fasting Lipid Panel No results for input(s): CHOL, HDL, LDLCALC, TRIG, CHOLHDL, LDLDIRECT in the last 72 hours. Thyroid Function Tests No results for input(s): TSH, T4TOTAL, T3FREE, THYROIDAB in the last 72 hours.  Invalid input(s): FREET3  Other results:   Imaging    Dg Cholangiogram Operative  Result Date: 01/24/2019 CLINICAL DATA:  58 year old male undergoing laparoscopic cholecystectomy EXAM: INTRAOPERATIVE CHOLANGIOGRAM TECHNIQUE: Cholangiographic images from the C-arm fluoroscopic  device were submitted for interpretation post-operatively. Please see the procedural report for the amount of contrast and the fluoroscopy time utilized. COMPARISON:  Right upper quadrant ultrasound 11/08/2018 FINDINGS: A cine clip and single saved image are submitted for review. The images demonstrate cannulation of the cystic duct remanent and opacification of the biliary tree. No evidence of biliary ductal dilation, stenosis, stricture, choledocholithiasis or other significant abnormality. Contrast material passes freely through the ampulla and into the duodenum. IMPRESSION: Negative intraoperative cholangiogram. Electronically Signed   By: Jacqulynn Cadet M.D.   On: 01/24/2019 16:26   Dg Chest Port 1 View  Result Date: 01/24/2019 CLINICAL DATA:  Central line placement EXAM: PORTABLE CHEST  1 VIEW COMPARISON:  01/23/2019 FINDINGS: Left internal jugular central line has been placed with the tip near the confluence of the innominate veins. No pneumothorax. Cardiomegaly with vascular congestion. Increasing bibasilar opacities could reflect atelectasis or infiltrates. Small effusions. IMPRESSION: Left central line tip near the confluence of the innominate veins. No pneumothorax. Cardiomegaly, vascular congestion. Increasing bibasilar atelectasis or infiltrates. Small effusions. Electronically Signed   By: Rolm Baptise M.D.   On: 01/24/2019 19:10     Medications:     Scheduled Medications: . allopurinol  300 mg Oral Daily  . atorvastatin  40 mg Oral q1800  . Chlorhexidine Gluconate Cloth  6 each Topical Daily  . citalopram  20 mg Oral Daily  . [START ON 01/26/2019] enoxaparin (LOVENOX) injection  40 mg Subcutaneous Q24H  . fluticasone  1 spray Each Nare Daily  . loratadine  10 mg Oral QODAY  . midodrine  5 mg Oral TID WC  . pantoprazole  40 mg Oral BID WC  . potassium chloride SA  20 mEq Oral Daily  . senna-docusate  2 tablet Oral QHS  . torsemide  20 mg Oral BID    Infusions:   PRN Medications: docusate sodium, LORazepam, morphine injection, oxyCODONE, polyvinyl alcohol, triamcinolone cream   Assessment/Plan   1. Pulmonary hypertension with RV failure/cor pulmonale: Suspect that the pulmonary hypertension is a combination of group 2 (diastolic CHF) and primarily group 3 PH (OHS/OSA). He cannot tolerate CPAP. High resolution CT chest was not suggestive of ILD. V/Q scan did not show chronic PEs in 2016. Volume status stable on exam and weight has stable. Most recent echo in 7/20 showed improvement in RV => now only mildly dilated with normal systolic function.  - Continue oxygen while sleeping, has not needed during the day recently.  - As PH appears to be group 2 and group 3 (unlikely group 1), think it is reasonable to hold off on selective pulmonary vasodilators.  Treatment will be oxygen and diuresis (he cannot tolerate CPAP).  - He is on midodrine 5 mg TID to keep BP up, continue.  2. Chronic diastolic CHF: In addition to RV failure, lastRHC showed elevated PCWP. Last echo in 7/20 showed EF 55-60% with improved RV function.   Weight stable post-op, doubt significant volume overload though exam is difficult.  - Continue torsemide 20 mg bid as at home.   - Would keep on telemetry with CHF.  3. CKD: Stage 3. Baseline around 2. Creatinine mildly lower today at 1.9.  4. GI bleeding: Capsule negative in 6/19.No recent overt GI bleeding. 5. Cholelithiasis: Successful lap cholecystectomy 9/23.  6. 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  - Continue atorvastatin 40 mg daily.  - Hold off on ASA 81 with GI bleedinghistory.  He should be able to go home soon.  Cardiology  will sign off, call with questions.   Length of Stay: 3  Loralie Champagne, MD  01/25/2019, 11:34 AM  Advanced Heart Failure Team Pager [phone] (M-F; 7a - 4p)  Please contact [facility] for night-coverage after hours (4p -7a ) and weekends on [email or web address]

## 2019-01-26 LAB — SURGICAL PATHOLOGY

## 2019-01-29 NOTE — Anesthesia Postprocedure Evaluation (Signed)
Anesthesia Post Note  Patient: Tony Jones  Procedure(s) Performed: LAPAROSCOPIC CHOLECYSTECTOMY WITH INTRAOPERATIVE CHOLANGIOGRAM (N/A Abdomen)     Patient location during evaluation: PACU Anesthesia Type: General Level of consciousness: awake and alert Pain management: pain level controlled Vital Signs Assessment: post-procedure vital signs reviewed and stable Respiratory status: spontaneous breathing, nonlabored ventilation, respiratory function stable and patient connected to nasal cannula oxygen Cardiovascular status: blood pressure returned to baseline and stable Postop Assessment: no apparent nausea or vomiting Anesthetic complications: no    Last Vitals:  Vitals:   01/25/19 0517 01/25/19 1549  BP: 118/82 108/76  Pulse: 98 100  Resp: 18 19  Temp: 36.9 C 36.9 C  SpO2: 96% 97%    Last Pain:  Vitals:   01/25/19 1549  TempSrc: Oral  PainSc:                  [name] COKER

## 2019-03-26 ENCOUNTER — Other Ambulatory Visit: Payer: Self-pay | Admitting: Gastroenterology

## 2019-03-26 DIAGNOSIS — R109 Unspecified abdominal pain: Secondary | ICD-10-CM

## 2019-04-05 ENCOUNTER — Other Ambulatory Visit: Payer: Self-pay

## 2019-04-05 ENCOUNTER — Ambulatory Visit
Admission: RE | Admit: 2019-04-05 | Discharge: 2019-04-05 | Disposition: A | Discharge status: Home / Self Care | Payer: Medicare (Managed Care) | Source: Ambulatory Visit | Attending: Gastroenterology | Admitting: Gastroenterology

## 2019-04-05 DIAGNOSIS — R109 Unspecified abdominal pain: Secondary | ICD-10-CM

## 2019-07-10 ENCOUNTER — Encounter: Payer: Self-pay | Admitting: General Practice

## 2019-08-06 ENCOUNTER — Ambulatory Visit: Payer: Medicare (Managed Care) | Admitting: Cardiology

## 2019-08-27 ENCOUNTER — Encounter: Payer: Self-pay | Admitting: Cardiology

## 2019-08-27 ENCOUNTER — Other Ambulatory Visit: Payer: Self-pay

## 2019-08-27 ENCOUNTER — Ambulatory Visit (INDEPENDENT_AMBULATORY_CARE_PROVIDER_SITE_OTHER): Payer: Medicare (Managed Care) | Admitting: Cardiology

## 2019-08-27 VITALS — BP 127/83 | HR 89 | Temp 97.1°F | Ht 61.0 in | Wt 183.6 lb

## 2019-08-27 DIAGNOSIS — I5032 Chronic diastolic (congestive) heart failure: Secondary | ICD-10-CM | POA: Diagnosis not present

## 2019-08-27 DIAGNOSIS — G4733 Obstructive sleep apnea (adult) (pediatric): Secondary | ICD-10-CM | POA: Diagnosis not present

## 2019-08-27 DIAGNOSIS — I951 Orthostatic hypotension: Secondary | ICD-10-CM

## 2019-08-27 DIAGNOSIS — E668 Other obesity: Secondary | ICD-10-CM

## 2019-08-27 DIAGNOSIS — I272 Pulmonary hypertension, unspecified: Secondary | ICD-10-CM

## 2019-08-27 MED ORDER — TORSEMIDE 20 MG PO TABS: 20.0000 mg | ORAL_TABLET | Freq: Two times a day (BID) | ORAL | 3 refills | Status: DC

## 2019-08-27 MED ORDER — MIDODRINE HCL 5 MG PO TABS: 5.0000 mg | ORAL_TABLET | ORAL | 6 refills | Status: DC

## 2019-08-27 NOTE — Patient Instructions (Signed)
Medication Instructions:  Take  Extra  demadex   Dose if weight is above 3 lbs  Daily weight. *If you need a refill on your cardiac medications before your next appointment, please call your pharmacy*   Lab Work: Not needed   Testing/Procedures: Not needed   Follow-Up: At [facility], you and your health needs are our priority.  As part of our continuing mission to provide you with exceptional heart care, we have created designated Provider Care Teams.  These Care Teams include your primary Cardiologist (physician) and Advanced Practice Providers (APPs -  Physician Assistants and Nurse Practitioners) who all work together to provide you with the care you need, when you need it.    Your next appointment:   12 month(s)  The format for your next appointment:   In Person  Provider:   Glenetta Hew, MD   Other Instructions n/a

## 2019-08-27 NOTE — Progress Notes (Signed)
Primary Care Provider: Angelica Pou, MD Cardiologist: Glenetta Hew, MD Heart Failure/PAH Cardiologist: Dr. Aundra Dubin  Clinic Note: Chief Complaint  Patient presents with  . Follow-up    Doing well.    HPI:    Tony Jones is a 59 y.o. male with a PMH notable for Severe OSA/OHS with Severe PAH (group 2 and group 3, unlikely group 1 )with chronic DHF.  Below who presents today for annual follow-up.  I have not seen Tony Jones since September 2019 after he was hospitalized for heart failure and required significant diuresis.  He has been following up with Dr. Aundra Dubin for the heart failure clinic.  Tony Jones was last seen in July 2020 by Dr. Aundra Dubin, via telemedicine.  He recalled that the patient been aggressively diuresed following his right and left heart cath in June 2019 and been doing relatively well since then.  Noted that back in August 2019 he had potential cholecystectomy delayed due to heart failure symptoms that were no longer present.  He was seen by Drs. McLean and Bensimhon on January 24, 2019 during inpatient consultation for cholecystitis -> lap chole. -  At this time was no longer on oxygen and right upper quadrant pain was well controlled. ->  Noted likely group 2 and group 3 pulmonary pretension (diastolic heart failure and OHS/OSA).  No evidence of ILD.  No evidence to suggest recurrent PE.  Was euvolemic.  Follow-up echo in July 2020 showed notable improvement in RV function.  Was not a good candidate for psychosocial reasons for several of the pulmonary vasodilators.  They have opted on keeping him off of meds for now.  He does use midodrine to keep his blood pressure up.  On stable dose of torsemide monitoring volume status.  Recent Hospitalizations:   October 19, 2017: Acute on chronic diastolic heart failure severe pulmonary pretension, massive volume overload.  January 23, 2019: Admitted for cholecystitis -> Laparoscopic Cholecystectomy  Reviewed  CV studies:     The following studies were reviewed today: (if available, images/films reviewed: From Epic Chart or Care Everywhere) . Right and left heart cath -October 05, 2017: Severe PAH elevated left and right filling pressures.  Preserved cardiac output.  Nonobstructive CAD. RA 16 ; RV 92/16 PA 93/51 mean 67; PCWP mean 27 LV 110/21;  AO 115/75  Oxygen saturations: PA 56%, O 99%  Cardiac Output (Fick) 4.95 ; Cardiac Index (Fick) 2.65 PVR 8 WU / PAPi 2.65    November 23, 2018: TTE: Normal LV size and function.  EF 55 to 60%.  Moderate basal septal hypertrophy.  GR 1 DD.  No R WMA.  Normal RV size with preserved/normal function.  Moderate aortic sclerosis but no stenosis.  Mild aortic root dilation at 38 mm.  Lipomatous IAS  Interval History:   Tony Jones returns here today with his mother actually doing very well.  He is not really having any significant issues with dyspnea.  He does use nighttime oxygen but was unable to tolerate CPAP.  Really he is not having any significant dyspnea either at rest or exertion.  Abdominal pain seems to be notably improved.  He does not usually walk around much when he is in the house.  But if [place] go [place] to the lake, he enjoys walking around the [place] on the paths.  He does quite well at that time.  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  No real  complaints of PND, orthopnea or edema.  No real exertional dyspnea or chest pain/pressure.  CV Review of Symptoms (Summary) Cardiovascular ROS: positive for - He has deconditioned and will get short of breath if he overexerts, but not with routine activity. negative for - edema, orthopnea, palpitations, paroxysmal nocturnal dyspnea, rapid heart rate, shortness of breath or Syncope/near syncope, TIA/amaurosis fugax, claudication  The patient does not have symptoms concerning for COVID-19 infection (fever, chills, cough, or new shortness of breath).  The patient is practicing social distancing &  Masking.   He has had his 2 COVID-19 vaccine injections  REVIEWED OF SYSTEMS   Review of Systems  Constitutional: Negative for malaise/fatigue (Just does not have much desire to do things) and weight loss.  HENT: Positive for congestion. Negative for nosebleeds.   Respiratory: Negative for shortness of breath (Not really noticing dyspnea).   Gastrointestinal: Negative for abdominal pain, blood in stool and melena.  Genitourinary: Negative for frequency and hematuria.  Musculoskeletal: Positive for back pain and joint pain ([place]).  Neurological: Negative for dizziness (Only if he does not get enough sleep) and focal weakness.  Psychiatric/Behavioral: Negative for depression and memory loss. The patient is not nervous/anxious and does not have insomnia.    I have reviewed and (if needed) personally updated the patient's problem list, medications, allergies, past medical and surgical history, social and family history.   PAST MEDICAL HISTORY   Past Medical History:  Diagnosis Date  . Anemia   . Anxiety   . Barrett esophagus   . Cholelithiasis   . Chronic respiratory failure with hypoxia ([place])   . CKD (chronic kidney disease) stage 3, GFR 30-59 ml/min   . Developmental delay   . Diabetes mellitus type 2, uncontrolled ([place])   . Dyslipidemia   . Gallstones 01/2019  . GERD (gastroesophageal reflux disease)   . Hypertension   . Mental disorder    Chronic developmental delay.; still lives with mother as primary care giver; spends 2-3 days in adult day-care.  . Nephrolithiasis   . Pulmonary hypertension ([place])    Likely related to long-standing hypoxia and possible OSA/OHS (group 3) & ~DHF (Group 2); -- No longer on Pulmnoary Vasodilator (family request)  . Right heart failure [facility])    Recovered as on July 2020    PAST SURGICAL HISTORY   Past Surgical History:  Procedure Laterality Date  . CHOLECYSTECTOMY N/A 01/24/2019   Procedure: LAPAROSCOPIC CHOLECYSTECTOMY WITH  INTRAOPERATIVE CHOLANGIOGRAM;  Surgeon: Donnie Mesa, MD;  Location: [facility];  Service: General;  Laterality: N/A;  . CYSTOSCOPY/URETEROSCOPY/HOLMIUM LASER/STENT PLACEMENT Right 11/14/2017   Procedure: CYSTOSCOPY/URETEROSCOPY/RETROGRADE/STENT PLACEMENT;  Surgeon: [name], MD;  Location: [facility];  Service: Urology;  Laterality: Right;  75 MINS  SPINAL OR MAC ANESTHESIA  ALSO EXTRACTION OF STONE  . ESOPHAGOGASTRODUODENOSCOPY N/A 08/02/2017   Procedure: ESOPHAGOGASTRODUODENOSCOPY (EGD);  Surgeon: [name], MD;  Location: [facility];  Service: Endoscopy;  Laterality: N/A;  . GIVENS CAPSULE STUDY N/A 10/12/2017   Procedure: GIVENS CAPSULE STUDY;  Surgeon: [name], MD;  Location: [facility];  Service: Endoscopy;  Laterality: N/A;  . 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 (R>L). RAP 16 mmHg, RVP 92/16 mmHg; PAP 93/51 mmHg mean 67, PCWP mn27 mmHg. LVP 110/21 - EDP 21 mmHg. AoP 115/75 mmHg.  PaO2 56%, AoO2 99%. CO/I Kathlen [place])  4.95-2.65, PVR 8 WU, PAPi 2.65 (PVR 8 WU)  . THROAT SURGERY    . TRANSTHORACIC ECHOCARDIOGRAM  01/2015   Mild concentric LVH.  EF 60 to 65%.  GR 1 DD, but high filling pressures.  IVS had diastolic and systolic flattening consistent with volume and pressure overload.  PA pressures estimated 75 mmHg.  Marland Kitchen TRANSTHORACIC ECHOCARDIOGRAM  11/2018   Normal LV size and function.  EF 55 to 60%.  Moderate basal septal hypertrophy.  GR 1 DD.  No R WMA.  Normal RV size with preserved/normal function.  Moderate aortic sclerosis but no stenosis.  Mild aortic root dilation at 38 mm.  Lipomatous IAS    MEDICATIONS/ALLERGIES   Current Meds  Medication Sig  . acetaminophen (TYLENOL) 650 MG CR tablet Take 1,300 mg by mouth daily as needed for pain.  Marland Kitchen allopurinol (ZYLOPRIM) 300 MG tablet Take 300 mg by  mouth daily.   Marland Kitchen atorvastatin (LIPITOR) 40 MG tablet Take 1 tablet (40 mg total) by mouth daily at 6 PM.  . citalopram (CELEXA) 20 MG tablet Take 20 mg by mouth daily.  Marland Kitchen docusate sodium (COLACE) 100 MG capsule Take 100 mg by mouth daily as needed for mild constipation.   . fluticasone (FLONASE) 50 MCG/ACT nasal spray Place 1 spray into both nostrils daily.  Marland Kitchen levocetirizine (XYZAL) 5 MG tablet Take 2.5 mg by mouth every other day.   Marland Kitchen LORazepam (ATIVAN) 0.5 MG tablet Take 0.5 mg by mouth 2 (two) times daily as needed for anxiety.  . metFORMIN (GLUCOPHAGE) 500 MG tablet Take 500 mg by mouth daily with breakfast.  . midodrine (PROAMATINE) 5 MG tablet Take 1 tablet (5 mg total) by mouth See admin instructions. Take 1 tablet (5 mg) by mouth 3 times daily, except take an additional tablet (5 mg) by mouth at midday on center days to maintain adequate blood pressure.  . Multiple Vitamin (MULTIVITAMIN WITH MINERALS) TABS tablet Take 1 tablet by mouth daily with breakfast.   . pantoprazole (PROTONIX) 40 MG tablet Take 40 mg by mouth 2 (two) times daily with a meal.   . potassium chloride SA (K-DUR,KLOR-CON) 20 MEQ tablet Take 1 tablet (20 mEq total) by mouth daily.  Marland Kitchen Propylene Glycol (SYSTANE BALANCE OP) Place 1 drop into both eyes 2 (two) times daily as needed (dry eyes).  Marland Kitchen senna-docusate (SENOKOT-S) 8.6-50 MG tablet Take 2 tablets by mouth at bedtime.  . torsemide (DEMADEX) 20 MG tablet Take 1 tablet (20 mg total) by mouth 2 (two) times daily.  . traMADol (ULTRAM) 50 MG tablet Take 50 mg by mouth 2 (two) times daily as needed for severe pain.  Marland Kitchen triamcinolone cream (KENALOG) 0.1 % Apply 1 application topically See admin instructions. Apply to arms daily  . [DISCONTINUED] midodrine (PROAMATINE) 10 MG tablet Take 0.5 tablets (5 mg total) by mouth 3 (three) times daily with meals.  . [DISCONTINUED] midodrine (PROAMATINE) 5 MG tablet Take 5 mg by mouth See admin instructions. Take 1 tablet (5 mg) by mouth  3 times daily, except take an additional tablet (5 mg) by mouth at midday on center days to maintain adequate blood pressure.  . [DISCONTINUED] torsemide (DEMADEX) 20 MG tablet Take 20 mg by mouth 2 (two) times daily.    Allergies  Allergen Reactions  . Codeine Nausea And Vomiting    SOCIAL HISTORY/FAMILY HISTORY   Reviewed in Epic:  Pertinent findings: Still lives with his mother.  Considering increasing from 2 to 3 days a week at  adult daycare  OBJCTIVE -PE, EKG, labs   Wt Readings from Last 3 Encounters:  08/27/19 183 lb 9.6 oz (83.3 kg)  01/24/19 187 lb 2.7 oz (84.9 kg)  07/04/18 179 lb (81.2 kg)    Physical Exam: BP 127/83   Pulse 89   Temp (!) 97.1 F (36.2 C)   Ht _0  (1.549 m)   Wt 183 lb 9.6 oz (83.3 kg)   SpO2 95%   BMI 34.69 kg/m  Physical Exam  Constitutional: He is oriented to person, place, and time. He appears well-developed and well-nourished. No distress.  Actually was pretty healthy today.  He is wearing his mask and face shield, tolerating it well.  HENT:  Head: Normocephalic and atraumatic.  Neck: Hepatojugular reflux (Mild) present. No JVD present. Carotid bruit is not present.  Cardiovascular: Normal rate, regular rhythm, S1 normal, S2 normal and intact distal pulses.  No extrasystoles are present. PMI is not displaced. Exam reveals distant heart sounds. Exam reveals no gallop.  No murmur heard. Pulmonary/Chest: Effort normal and breath sounds normal.  Abdominal: Soft. He exhibits no distension. There is abdominal tenderness (Still has a little right upper quadrant tenderness, but notably improved).  Protuberant-obese abdomen  Musculoskeletal:        General: Edema (Trivial-1+ bilateral LE-ankles and shins) present. Normal range of motion.     Cervical back: Normal range of motion and neck supple.  Neurological: He is alert and oriented to person, place, and time.  Psychiatric: He has a normal mood and affect. Thought content normal.  Seem to be  behaving quite well.  Answering questions more than usual.  Vitals reviewed.    Adult ECG Report  Rate: 89 ;  Rhythm: normal sinus rhythm and Normal axis, intervals and durations.;   Narrative Interpretation: Normal EKG  Recent Labs: Should be due to have labs checked soon once he gets into see his PCP. Lab Results  Component Value Date   CHOL 175 07/04/2018   HDL 31 (L) 07/04/2018   LDLCALC 79 07/04/2018   TRIG 324 (H) 07/04/2018   CHOLHDL 5.6 07/04/2018   Lab Results  Component Value Date   CREATININE 1.92 (H) 01/25/2019   BUN 21 (H) 01/25/2019   NA 138 01/25/2019   K 4.7 01/25/2019   CL 98 01/25/2019   CO2 30 01/25/2019   Lab Results  Component Value Date   TSH 1.985 09/30/2017    ASSESSMENT/PLAN    Problem List Items Addressed This Visit    Moderate obesity (Chronic)    We talked little bit about dietary modification, I do not know how much this can help.      Relevant Medications   metFORMIN (GLUCOPHAGE) 500 MG tablet   Chronic diastolic heart failure (HCC) - Primary (Chronic)    Hard to imagine that he is got a major component of diastolic dysfunction since he has issues with hypotension requiring midodrine  Is on standing dose of diuretic but seems euvolemic. We discussed using extra dose if his weight goes up more than 3 pounds in a day.  I explained to him that it is important for him to actually check his weights daily.      Relevant Medications   midodrine (PROAMATINE) 5 MG tablet   torsemide (DEMADEX) 20 MG tablet   Other Relevant Orders   EKG 12-Lead (Completed)   Pulmonary hypertension (HCC) (Chronic)    No longer on Toviaz or any other pulmonary vasodilators.  Is actually on midodrine.  No  sign of volume overload.  Continuing nighttime oxygen as he does not tolerate CPAP.  Continue current dose of diuretic.      Relevant Medications   midodrine (PROAMATINE) 5 MG tablet   torsemide (DEMADEX) 20 MG tablet   Other Relevant Orders   EKG  12-Lead (Completed)   OSA (obstructive sleep apnea) (Chronic)    Not able to tolerate nighttime CPAP, therefore using nightly oxygen      Postural hypotension (Chronic)    Continue midodrine.  Taking at least twice a day and the third dose as needed.      Relevant Medications   midodrine (PROAMATINE) 5 MG tablet   torsemide (DEMADEX) 20 MG tablet       COVID-19 Education: The signs and symptoms of COVID-19 were discussed with the patient and how to seek care for testing (follow up with PCP or arrange E-visit).   The importance of social distancing and COVID-19 vaccination was discussed today.  I spent a total of 78mnutes with the patient. >  50% of the time was spent in direct patient consultation.  Additional time spent with chart review  / charting (studies, outside notes, etc): 8 Total Time: 26 min   Current medicines are reviewed at length with the patient today.  (+/- concerns) none  Notice: This dictation was prepared with Dragon dictation along with smaller phrase technology. Any transcriptional errors that result from this process are unintentional and may not be corrected upon review.  Patient Instructions / Medication Changes & Studies & Tests Ordered   Patient Instructions  Medication Instructions:  Take  Extra  demadex   Dose if weight is above 3 lbs  Daily weight. *If you need a refill on your cardiac medications before your next appointment, please call your pharmacy*   Lab Work: Not needed   Testing/Procedures: Not needed   Follow-Up: [facility] and your health needs are our priority.  As part of our continuing mission to provide you with exceptional heart care, we have created designated Provider Care Teams.  These Care Teams include your primary Cardiologist (physician) and Advanced Practice Providers (APPs -  Physician Assistants and Nurse Practitioners) who all work together to provide you with the care you need, when you need  it.    Your next appointment:   12 month(s)  The format for your next appointment:   In Person  Provider:   DGlenetta Hew MD   Other Instructions n/a    Studies Ordered:   Orders Placed This Encounter  Procedures  . EKG 12-Lead     DGlenetta Hew M.D., M.S. Interventional Cardiologist   Pager # [phone] # [phone] [place] [id number]  [facility] you for choosing [facility]!

## 2019-09-02 ENCOUNTER — Encounter: Payer: Self-pay | Admitting: Cardiology

## 2019-09-02 DIAGNOSIS — I951 Orthostatic hypotension: Secondary | ICD-10-CM | POA: Insufficient documentation

## 2019-09-02 NOTE — Assessment & Plan Note (Signed)
No longer on Toviaz or any other pulmonary vasodilators.  Is actually on midodrine.  No sign of volume overload.  Continuing nighttime oxygen as he does not tolerate CPAP.  Continue current dose of diuretic.

## 2019-09-02 NOTE — Assessment & Plan Note (Signed)
Continue midodrine.  Taking at least twice a day and the third dose as needed.

## 2019-09-02 NOTE — Assessment & Plan Note (Signed)
We talked little bit about dietary modification, I do not know how much this can help.

## 2019-09-02 NOTE — Assessment & Plan Note (Signed)
Not able to tolerate nighttime CPAP, therefore using nightly oxygen

## 2019-09-02 NOTE — Assessment & Plan Note (Signed)
Hard to imagine that he is got a major component of diastolic dysfunction since he has issues with hypotension requiring midodrine  Is on standing dose of diuretic but seems euvolemic. We discussed using extra dose if his weight goes up more than 3 pounds in a day.  I explained to him that it is important for him to actually check his weights daily.

## 2019-11-13 ENCOUNTER — Encounter: Payer: Self-pay | Admitting: Podiatry

## 2019-11-13 ENCOUNTER — Other Ambulatory Visit: Payer: Self-pay | Admitting: Podiatry

## 2019-11-13 ENCOUNTER — Ambulatory Visit (INDEPENDENT_AMBULATORY_CARE_PROVIDER_SITE_OTHER): Payer: Medicare Other

## 2019-11-13 ENCOUNTER — Ambulatory Visit (INDEPENDENT_AMBULATORY_CARE_PROVIDER_SITE_OTHER): Payer: Medicare (Managed Care) | Admitting: Podiatry

## 2019-11-13 ENCOUNTER — Other Ambulatory Visit: Payer: Self-pay

## 2019-11-13 DIAGNOSIS — M25571 Pain in right ankle and joints of right foot: Secondary | ICD-10-CM

## 2019-11-13 DIAGNOSIS — M79672 Pain in left foot: Secondary | ICD-10-CM

## 2019-11-13 DIAGNOSIS — M779 Enthesopathy, unspecified: Secondary | ICD-10-CM

## 2019-11-13 DIAGNOSIS — M722 Plantar fascial fibromatosis: Secondary | ICD-10-CM | POA: Diagnosis not present

## 2019-11-13 NOTE — Patient Instructions (Signed)
For instructions on how to put on your Tri-Lock Ankle Brace, please visit [email or web address] 

## 2019-11-14 NOTE — Progress Notes (Signed)
Subjective:   Patient ID: Tony Jones, male   DOB: 59 y.o.   MRN: [id number]   HPI 59 year old male presents the office today for concerns of pain to the right ankle pointing to the medial aspect.  He says it hurts when he puts shoes on puts weight on his foot at times other than that it is difficult to get a full history from him.  He presents with his mom who is not sure what is going on.  He previously was seen in 2016 for left foot pain.  That pain resolved wearing a brace as well as steroid injection but apparently this is different.  Review of Systems  All other systems reviewed and are negative.  Past Medical History:  Diagnosis Date  . Anemia   . Anxiety   . Barrett esophagus   . Cholelithiasis   . Chronic respiratory failure with hypoxia ([place])   . CKD (chronic kidney disease) stage 3, GFR 30-59 ml/min   . Developmental delay   . Diabetes mellitus type 2, uncontrolled ([place])   . Dyslipidemia   . Gallstones 01/2019  . GERD (gastroesophageal reflux disease)   . Hypertension   . Mental disorder    Chronic developmental delay.; still lives with mother as primary care giver; spends 2-3 days in adult day-care.  . Nephrolithiasis   . Pulmonary hypertension ([place])    Likely related to long-standing hypoxia and possible OSA/OHS (group 3) & ~DHF (Group 2); -- No longer on Pulmnoary Vasodilator (family request)  . Right heart failure [facility])    Recovered as on July 2020    Past Surgical History:  Procedure Laterality Date  . CHOLECYSTECTOMY N/A 01/24/2019   Procedure: LAPAROSCOPIC CHOLECYSTECTOMY WITH INTRAOPERATIVE CHOLANGIOGRAM;  Surgeon: Donnie Mesa, MD;  Location: [facility];  Service: General;  Laterality: N/A;  . CYSTOSCOPY/URETEROSCOPY/HOLMIUM LASER/STENT PLACEMENT Right 11/14/2017   Procedure: CYSTOSCOPY/URETEROSCOPY/RETROGRADE/STENT PLACEMENT;  Surgeon: [name], MD;  Location: [facility];  Service: Urology;  Laterality: Right;  75 MINS  SPINAL OR MAC ANESTHESIA  ALSO  EXTRACTION OF STONE  . ESOPHAGOGASTRODUODENOSCOPY N/A 08/02/2017   Procedure: ESOPHAGOGASTRODUODENOSCOPY (EGD);  Surgeon: [name], MD;  Location: [facility];  Service: Endoscopy;  Laterality: N/A;  . GIVENS CAPSULE STUDY N/A 10/12/2017   Procedure: GIVENS CAPSULE STUDY;  Surgeon: [name], MD;  Location: [facility];  Service: Endoscopy;  Laterality: N/A;  . 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 (R>L). RAP 16 mmHg, RVP 92/16 mmHg; PAP 93/51 mmHg mean 67, PCWP mn27 mmHg. LVP 110/21 - EDP 21 mmHg. AoP 115/75 mmHg.  PaO2 56%, AoO2 99%. CO/I (Fick) 4.95-2.65, PVR 8 WU, PAPi 2.65 (PVR 8 WU)  . THROAT SURGERY    . TRANSTHORACIC ECHOCARDIOGRAM  01/2015   Mild concentric LVH.  EF 60 to 65%.  GR 1 DD, but high filling pressures.  IVS had diastolic and systolic flattening consistent with volume and pressure overload.  PA pressures estimated 75 mmHg.  Marland Kitchen TRANSTHORACIC ECHOCARDIOGRAM  11/2018   Normal LV size and function.  EF 55 to 60%.  Moderate basal septal hypertrophy.  GR 1 DD.  No R WMA.  Normal RV size with preserved/normal function.  Moderate aortic sclerosis but no stenosis.  Mild aortic root dilation at 38 mm.  Lipomatous IAS     Current Outpatient Medications:  .  acetaminophen (TYLENOL) 650 MG CR tablet, Take 1,300 mg by mouth daily as needed for pain., Disp: , Rfl:  .  allopurinol (ZYLOPRIM) 300 MG tablet, Take 300 mg by mouth daily. , Disp: , Rfl:  .  atorvastatin (LIPITOR) 40 MG tablet, Take 1 tablet (40 mg total) by mouth daily at 6 PM., Disp: 30 tablet, Rfl: 6 .  citalopram (CELEXA) 20 MG tablet, Take 20 mg by mouth daily., Disp: , Rfl:  .  docusate sodium (COLACE) 100 MG capsule, Take 100 mg by mouth daily as needed for mild constipation. , Disp: , Rfl:  .  fluticasone (FLONASE) 50 MCG/ACT nasal  spray, Place 1 spray into both nostrils daily., Disp: , Rfl:  .  levocetirizine (XYZAL) 5 MG tablet, Take 2.5 mg by mouth every other day. , Disp: , Rfl:  .  LORazepam (ATIVAN) 0.5 MG tablet, Take 0.5 mg by mouth 2 (two) times daily as needed for anxiety., Disp: , Rfl:  .  metFORMIN (GLUCOPHAGE) 500 MG tablet, Take 500 mg by mouth daily with breakfast., Disp: , Rfl:  .  midodrine (PROAMATINE) 5 MG tablet, Take 1 tablet (5 mg total) by mouth See admin instructions. Take 1 tablet (5 mg) by mouth 3 times daily, except take an additional tablet (5 mg) by mouth at midday on center days to maintain adequate blood pressure., Disp: 120 tablet, Rfl: 6 .  Multiple Vitamin (MULTIVITAMIN WITH MINERALS) TABS tablet, Take 1 tablet by mouth daily with breakfast. , Disp: , Rfl:  .  pantoprazole (PROTONIX) 40 MG tablet, Take 40 mg by mouth 2 (two) times daily with a meal. , Disp: , Rfl:  .  potassium chloride SA (K-DUR,KLOR-CON) 20 MEQ tablet, Take 1 tablet (20 mEq total) by mouth daily., Disp: 30 tablet, Rfl: 3 .  Propylene Glycol (SYSTANE BALANCE OP), Place 1 drop into both eyes 2 (two) times daily as needed (dry eyes)., Disp: , Rfl:  .  senna-docusate (SENOKOT-S) 8.6-50 MG tablet, Take 2 tablets by mouth at bedtime., Disp: , Rfl:  .  torsemide (DEMADEX) 20 MG tablet, Take 1 tablet (20 mg total) by mouth 2 (two) times daily., Disp: 180 tablet, Rfl: 3 .  traMADol (ULTRAM) 50 MG tablet, Take 50 mg by mouth 2 (two) times daily as needed for severe pain., Disp: , Rfl:  .  triamcinolone cream (KENALOG) 0.1 %, Apply 1 application topically See admin instructions. Apply to arms daily, Disp: , Rfl:   Allergies  Allergen Reactions  . Codeine Nausea And Vomiting         Objective:  Physical Exam  General:NAD  Dermatological: Skin is warm, dry and supple bilateral.There are no open sores, no preulcerative lesions, no rash or signs of infection present.  Vascular: Dorsalis Pedis artery and Posterior Tibial artery  pedal pulses are 2/4 bilateral with immedate capillary fill time.  There is no pain with calf compression, swelling, warmth, erythema.   Neruologic: Grossly intact via light touch bilateral.   Musculoskeletal: There is mild tenderness in the course of the medial ankle on the right side along the course the posterior tibial, flexor tendons.  There is minimal edema.  There is no erythema or warmth.  No area of pinpoint tenderness.  Appears to have a cavus foot type.  Muscular strength 5/5 in all groups tested bilateral.  Gait: Unassisted, Nonantalgic.       Assessment:   Right ankle tendinitis     Plan:  -Treatment options discussed including all alternatives, risks, and  complications -Etiology of symptoms were discussed -X-rays were obtained and reviewed with the patient.  No definitive evidence of acute fracture or stress fracture.  Digital contractures are evident. -Tri-Lock ankle brace was dispensed. -Ice to the area daily -We will start physical therapy.  An order was written for him to get physical therapy but [facility]  Return in about 6 weeks (around 12/25/2019).  Trula Slade DPM

## 2019-12-25 ENCOUNTER — Ambulatory Visit (INDEPENDENT_AMBULATORY_CARE_PROVIDER_SITE_OTHER): Payer: Medicare (Managed Care) | Admitting: Podiatry

## 2019-12-25 ENCOUNTER — Other Ambulatory Visit: Payer: Self-pay

## 2019-12-25 DIAGNOSIS — M779 Enthesopathy, unspecified: Secondary | ICD-10-CM | POA: Diagnosis not present

## 2019-12-25 DIAGNOSIS — M722 Plantar fascial fibromatosis: Secondary | ICD-10-CM | POA: Diagnosis not present

## 2019-12-25 DIAGNOSIS — M25571 Pain in right ankle and joints of right foot: Secondary | ICD-10-CM

## 2019-12-25 NOTE — Patient Instructions (Addendum)
Continue physical therapy to work on strengthening your ankle If pain comes back please let me know

## 2019-12-27 NOTE — Progress Notes (Signed)
Subjective: 59 year old male presents the office today for evaluation of foot pain.  He previously was treated for left foot in 2016 I saw him for the right ankle.  He states he has been doing well he is not having any pain currently.  His mom states that she believes that he is still doing physical therapy at least once a week.  No recent injury or falls or changes otherwise but difficult to get a history. Denies any systemic complaints such as fevers, chills, nausea, vomiting. No acute changes since last appointment, and no other complaints at this time.   Objective: NAD DP/PT pulses palpable bilaterally, CRT less than 3 seconds At this time there is no area tenderness to bilateral lower extremities there is no edema, erythema.  There is no area of pinpoint tenderness.  Flexor, extensor tendons appear to be intact.  MMT 5/5. No pain with calf compression, swelling, warmth, erythema  Assessment: Resolved ankle, foot pain  Plan: -All treatment options discussed with the patient including all alternatives, risks, complications.  -Continue with supportive shoes we discussed continue physical therapy to work on strengthening ankles as well as general stretching, rehab.  At this point is not having any pain now see him back as needed. -Patient encouraged to call the office with any questions, concerns, change in symptoms.   Trula Slade DPM

## 2020-02-24 IMAGING — DX DG CHEST 1V
1 series · 1 of 1 positions shown · non-contrast
Comparison: 10/01/2017

CLINICAL DATA: Nausea for several days

EXAM:
CHEST  1 VIEW

[chest]
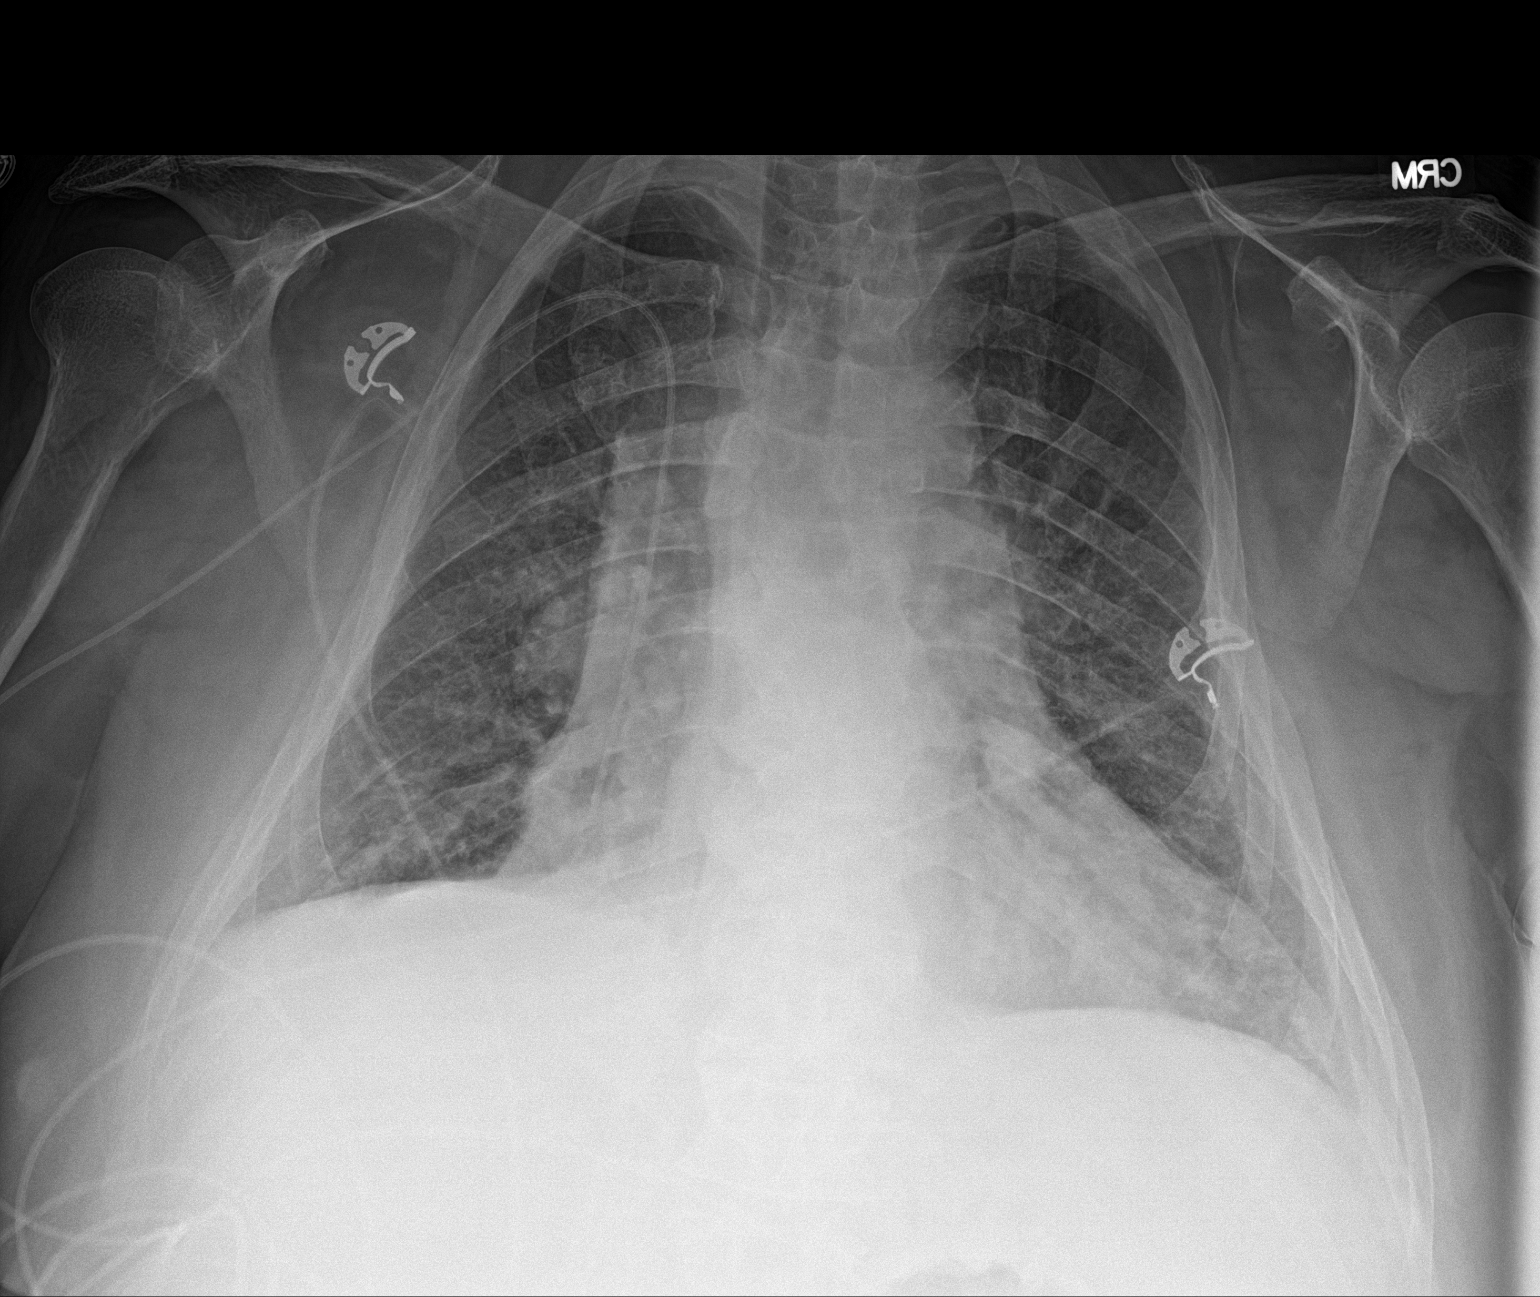

[1 of 1 positions shown; findings below may reference images not displayed]

FINDINGS: Cardiac shadow remains enlarged. Mild vascular congestion is again
seen. Right pleural effusion has improved in the interval from the
prior exam. Right-sided PICC line is noted. No bony abnormality is
seen.
IMPRESSION: Persistent vascular congestion.

## 2020-02-26 IMAGING — CR DG CHEST 1V PORT
1 series · 1 of 1 positions shown · non-contrast
Comparison: 10/03/2017

CLINICAL DATA: Shortness of breath.

EXAM:
PORTABLE CHEST 1 VIEW

[AP]
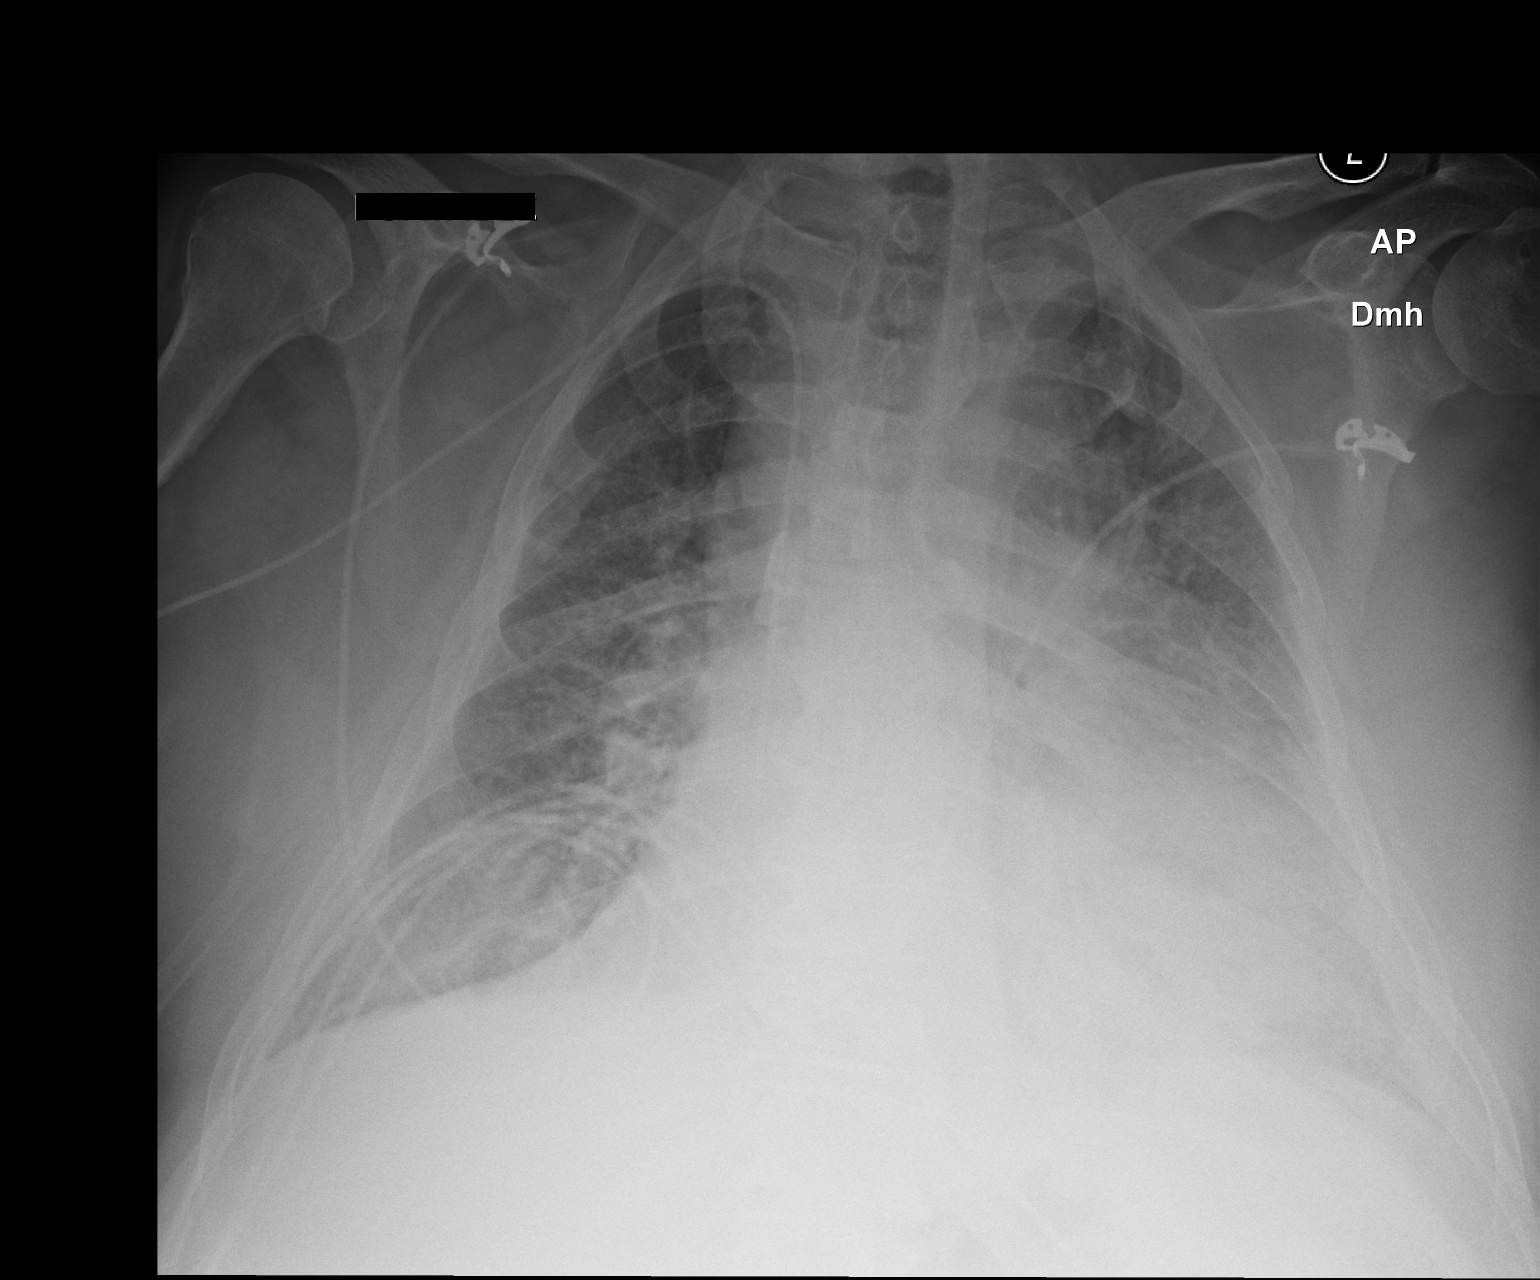

[1 of 1 positions shown; findings below may reference images not displayed]

FINDINGS: Right PICC line in stable position.

Cardiomediastinal silhouette is normal. Mediastinal contours appear
intact.

Interval development of mixed pattern pulmonary edema with more
confluent airspace consolidation in the left lower thorax.

Osseous structures are without acute abnormality. Soft tissues are
grossly normal.
IMPRESSION: Interval development of mixed pattern pulmonary edema.

More confluent airspace consolidation versus atelectasis in the left
lower thorax.

## 2020-03-03 ENCOUNTER — Other Ambulatory Visit: Payer: Self-pay

## 2020-03-03 ENCOUNTER — Emergency Department (HOSPITAL_COMMUNITY): Payer: Medicare (Managed Care)

## 2020-03-03 ENCOUNTER — Emergency Department (HOSPITAL_COMMUNITY)
Admission: EM | Admit: 2020-03-03 | Discharge: 2020-03-03 | Disposition: A | Discharge status: Home / Self Care | Payer: Medicare (Managed Care) | Attending: Emergency Medicine | Admitting: Emergency Medicine

## 2020-03-03 DIAGNOSIS — F88 Other disorders of psychological development: Secondary | ICD-10-CM | POA: Insufficient documentation

## 2020-03-03 DIAGNOSIS — N183 Chronic kidney disease, stage 3 unspecified: Secondary | ICD-10-CM | POA: Diagnosis not present

## 2020-03-03 DIAGNOSIS — E1122 Type 2 diabetes mellitus with diabetic chronic kidney disease: Secondary | ICD-10-CM | POA: Insufficient documentation

## 2020-03-03 DIAGNOSIS — K219 Gastro-esophageal reflux disease without esophagitis: Secondary | ICD-10-CM | POA: Diagnosis not present

## 2020-03-03 DIAGNOSIS — R10814 Left lower quadrant abdominal tenderness: Secondary | ICD-10-CM | POA: Diagnosis not present

## 2020-03-03 DIAGNOSIS — Z7952 Long term (current) use of systemic steroids: Secondary | ICD-10-CM | POA: Insufficient documentation

## 2020-03-03 DIAGNOSIS — J9621 Acute and chronic respiratory failure with hypoxia: Secondary | ICD-10-CM | POA: Diagnosis not present

## 2020-03-03 DIAGNOSIS — R109 Unspecified abdominal pain: Secondary | ICD-10-CM

## 2020-03-03 DIAGNOSIS — I5030 Unspecified diastolic (congestive) heart failure: Secondary | ICD-10-CM | POA: Diagnosis not present

## 2020-03-03 DIAGNOSIS — Z20822 Contact with and (suspected) exposure to covid-19: Secondary | ICD-10-CM | POA: Insufficient documentation

## 2020-03-03 DIAGNOSIS — Z7984 Long term (current) use of oral hypoglycemic drugs: Secondary | ICD-10-CM | POA: Insufficient documentation

## 2020-03-03 DIAGNOSIS — I130 Hypertensive heart and chronic kidney disease with heart failure and stage 1 through stage 4 chronic kidney disease, or unspecified chronic kidney disease: Secondary | ICD-10-CM | POA: Diagnosis not present

## 2020-03-03 LAB — URINALYSIS, ROUTINE W REFLEX MICROSCOPIC
Bilirubin Urine: NEGATIVE
Glucose, UA: NEGATIVE mg/dL
Hgb urine dipstick: NEGATIVE
Ketones, ur: NEGATIVE mg/dL
Leukocytes,Ua: NEGATIVE
Nitrite: NEGATIVE
Protein, ur: NEGATIVE mg/dL
Specific Gravity, Urine: 1.009 (ref 1.005–1.030)
pH: 5 (ref 5.0–8.0)

## 2020-03-03 LAB — CBC WITH DIFFERENTIAL/PLATELET
Abs Immature Granulocytes: 0.03 10*3/uL (ref 0.00–0.07)
Basophils Absolute: 0 10*3/uL (ref 0.0–0.1)
Basophils Relative: 0 %
Eosinophils Absolute: 0.2 10*3/uL (ref 0.0–0.5)
Eosinophils Relative: 3 %
HCT: 40.2 % (ref 39.0–52.0)
Hemoglobin: 12.3 g/dL — ABNORMAL LOW (ref 13.0–17.0)
Immature Granulocytes: 0 %
Lymphocytes Relative: 9 %
Lymphs Abs: 0.8 10*3/uL (ref 0.7–4.0)
MCH: 27.6 pg (ref 26.0–34.0)
MCHC: 30.6 g/dL (ref 30.0–36.0)
MCV: 90.1 fL (ref 80.0–100.0)
Monocytes Absolute: 0.4 10*3/uL (ref 0.1–1.0)
Monocytes Relative: 4 %
Neutro Abs: 7.5 10*3/uL (ref 1.7–7.7)
Neutrophils Relative %: 84 %
Platelets: 172 10*3/uL (ref 150–400)
RBC: 4.46 MIL/uL (ref 4.22–5.81)
RDW: 18 % — ABNORMAL HIGH (ref 11.5–15.5)
WBC: 8.9 10*3/uL (ref 4.0–10.5)
nRBC: 0 % (ref 0.0–0.2)

## 2020-03-03 LAB — COMPREHENSIVE METABOLIC PANEL
ALT: 12 U/L (ref 0–44)
AST: 15 U/L (ref 15–41)
Albumin: 3.3 g/dL — ABNORMAL LOW (ref 3.5–5.0)
Alkaline Phosphatase: 94 U/L (ref 38–126)
Anion gap: 10 (ref 5–15)
BUN: 22 mg/dL — ABNORMAL HIGH (ref 6–20)
CO2: 32 mmol/L (ref 22–32)
Calcium: 8.8 mg/dL — ABNORMAL LOW (ref 8.9–10.3)
Chloride: 98 mmol/L (ref 98–111)
Creatinine, Ser: 1.96 mg/dL — ABNORMAL HIGH (ref 0.61–1.24)
GFR, Estimated: 39 mL/min — ABNORMAL LOW (ref >60)
Glucose, Bld: 122 mg/dL — ABNORMAL HIGH (ref 70–99)
Potassium: 4.7 mmol/L (ref 3.5–5.1)
Sodium: 140 mmol/L (ref 135–145)
Total Bilirubin: 1.1 mg/dL (ref 0.3–1.2)
Total Protein: 7.2 g/dL (ref 6.5–8.1)

## 2020-03-03 LAB — RESPIRATORY PANEL BY RT PCR (FLU A&B, COVID)
Influenza A by PCR: NEGATIVE
Influenza B by PCR: NEGATIVE
SARS Coronavirus 2 by RT PCR: NEGATIVE

## 2020-03-03 LAB — BRAIN NATRIURETIC PEPTIDE: B Natriuretic Peptide: 25.8 pg/mL (ref 0.0–100.0)

## 2020-03-03 LAB — LIPASE, BLOOD: Lipase: 49 U/L (ref 11–51)

## 2020-03-03 LAB — TROPONIN I (HIGH SENSITIVITY): Troponin I (High Sensitivity): 7 ng/L (ref <18)

## 2020-03-03 MED ORDER — MIDODRINE HCL 5 MG PO TABS
5.0000 mg | ORAL_TABLET | Freq: Three times a day (TID) | ORAL | Status: AC
  Administered 2020-03-03: 5 mg via ORAL
  Filled 2020-03-03: qty 1

## 2020-03-03 MED ORDER — IOHEXOL 300 MG/ML  SOLN
100.0000 mL | Freq: Once | INTRAMUSCULAR | Status: AC | PRN
  Administered 2020-03-03: 80 mL via INTRAVENOUS

## 2020-03-03 NOTE — ED Notes (Signed)
Patient ambulated.  Pulse OX dropped to 90 %.  At rest, SaO2 100% on 2 L/M Nasal cannula

## 2020-03-03 NOTE — Discharge Instructions (Signed)
Tony Jones was admitted for acute on chronic hypoxic respiratory failure. His low oxygen saturations were likely due to worsening of his pulmonary hypertension in the setting of missing a midodrine dose this afternoon versus obstructive sleep apnea / obesity hypoventilation syndrome.   His oxygen saturations rapidly improved after he sat up and received his midodrine dose this evening.   His lab workup was unremarkable, without findings consistent with worsening heart failure, other cardiac cause of his symptoms, or any concerning abdominal findings.   His CT did show an enlarged lymph node next to his esophagus. It is very important he follow up with his pulmonologist within the next week to 2 weeks to discuss his low oxygen saturations, enlarged lymph node, and history of aortic stenosis on previous heart ultrasound.   Please return to the ED if [name] continues to experience decreased appetite, abdominal pain, chest pain, worsening SOB, cough, increase in his weight or swelling, or any other concerning symptoms.   Thank you,  Dr. Konrad Penta

## 2020-03-03 NOTE — ED Triage Notes (Signed)
Patient presents to the ED via EMS from his MD office with C/O abdominal pain and low O2 Sat.  SaO2 - 77% on EMS arrival. O2 Started via nasal cannula.  SaO2 to 94 %.  Patient C/O abdominal pain after drinking milk this AM.  Patient Taken to [facility] Then sent to [facility] ED.

## 2020-03-03 NOTE — ED Provider Notes (Signed)
[facility] EMERGENCY DEPARTMENT Provider Note   CSN: [id number] Arrival date & time: 03/03/20  1439     History Chief Complaint  Patient presents with  . Abdominal Pain    Tony Jones is a 59 y.o. male.  HPI   Tony Jones is a 59 y.o. gentleman with PMHx as noted below, significant for intellectual disability, type II DM, CKD, cholelithiasis in 2020, GERD with Barrett's esophagous, diastolic CHF, constipation, and HTN, who was brought to the ED by EMS due to concerns for left-sided abdominal pain. Patient's mother in the room provides most of the history. She states that he has complained of intermittent L-sided abdominal pain and suprapubic abdominal pain he describes as a "tightness" over the past couple of weeks, that does not radiate per patient. He states his pain worsens with drinking cold fluids and milk and deep breathing, and improves after having a bowel movement. His mother notes a history of constipation, for which he takes stool softeners. He has had regular bowel movements that have not been dark or bloody. His last BM was yesterday and was solid followed by watery stool. She is concerned that he has had decreased appetite over the past two days. He would not eat dinner last night and has not eaten at all today. He had nausea last night but denies any nausea today. Denies vomiting, fevers, or chills. He was noted to have an oxygen saturation of 77% at [facility], which improved to the 90's with 3L of oxygen. He chronically uses 3L of oxygen at night only. He denies any SOB but endorses mild, chronic non-productive cough. His mother notes intermittent abdominal distention but no LE swelling. He denies any urinary symptoms or any other symptoms at this time.      Past Medical History:  Diagnosis Date  . Anemia   . Anxiety   . Barrett esophagus   . Cholelithiasis   . Chronic respiratory failure with hypoxia ([place])   . CKD (chronic kidney disease) stage 3, GFR 30-59  ml/min   . Developmental delay   . Diabetes mellitus type 2, uncontrolled ([place])   . Dyslipidemia   . Gallstones 01/2019  . GERD (gastroesophageal reflux disease)   . Hypertension   . Mental disorder    Chronic developmental delay.; still lives with mother as primary care giver; spends 2-3 days in adult day-care.  . Nephrolithiasis   . Pulmonary hypertension ([place])    Likely related to long-standing hypoxia and possible OSA/OHS (group 3) & ~DHF (Group 2); -- No longer on Pulmnoary Vasodilator (family request)  . Right heart failure [facility])    Recovered as on July 2020    Patient Active Problem List   Diagnosis Date Noted  . Postural hypotension 09/02/2019  . Chronic cholecystitis with calculus 01/22/2019  . RUQ abdominal pain 12/21/2017  . Renal insufficiency 12/21/2017  . OSA (obstructive sleep apnea)   . RVF (right ventricular failure) ([place])   . Pulmonary hypertension ([place]) 09/30/2017  . Failure to thrive in adult 09/30/2017  . Anemia 09/30/2017  . Right heart failure ([place]) 09/30/2017  . Postural dizziness 08/29/2017  . Hematemesis 07/24/2017  . Moderate obesity 09/17/2015  . Thrombocytopenia ([place]) 02/02/2015  . Chronic diastolic heart failure ([place]) 01/29/2015  . Diabetes mellitus type 2, controlled ([place]) 01/29/2015  . Mental disorder   . Diabetes mellitus type 2, uncontrolled ([place])   . Hypertension   . Dyslipidemia     Past Surgical History:  Procedure Laterality Date  .  CHOLECYSTECTOMY N/A 01/24/2019   Procedure: LAPAROSCOPIC CHOLECYSTECTOMY WITH INTRAOPERATIVE CHOLANGIOGRAM;  Surgeon: Donnie Mesa, MD;  Location: [facility];  Service: General;  Laterality: N/A;  . CYSTOSCOPY/URETEROSCOPY/HOLMIUM LASER/STENT PLACEMENT Right 11/14/2017   Procedure: CYSTOSCOPY/URETEROSCOPY/RETROGRADE/STENT PLACEMENT;  Surgeon: [name], MD;  Location: [facility];  Service: Urology;  Laterality: Right;  75 MINS  SPINAL OR MAC ANESTHESIA  ALSO EXTRACTION OF STONE  .  ESOPHAGOGASTRODUODENOSCOPY N/A 08/02/2017   Procedure: ESOPHAGOGASTRODUODENOSCOPY (EGD);  Surgeon: [name], MD;  Location: [facility];  Service: Endoscopy;  Laterality: N/A;  . GIVENS CAPSULE STUDY N/A 10/12/2017   Procedure: GIVENS CAPSULE STUDY;  Surgeon: [name], MD;  Location: [facility];  Service: Endoscopy;  Laterality: N/A;  . 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 (R>L). RAP 16 mmHg, RVP 92/16 mmHg; PAP 93/51 mmHg mean 67, PCWP mn27 mmHg. LVP 110/21 - EDP 21 mmHg. AoP 115/75 mmHg.  PaO2 56%, AoO2 99%. CO/I ([place]) 4.95-2.65, PVR 8 WU, PAPi 2.65 (PVR 8 WU)  . THROAT SURGERY    . TRANSTHORACIC ECHOCARDIOGRAM  01/2015   Mild concentric LVH.  EF 60 to 65%.  GR 1 DD, but high filling pressures.  IVS had diastolic and systolic flattening consistent with volume and pressure overload.  PA pressures estimated 75 mmHg.  Marland Kitchen TRANSTHORACIC ECHOCARDIOGRAM  11/2018   Normal LV size and function.  EF 55 to 60%.  Moderate basal septal hypertrophy.  GR 1 DD.  No R WMA.  Normal RV size with preserved/normal function.  Moderate aortic sclerosis but no stenosis.  Mild aortic root dilation at 38 mm.  Lipomatous IAS       Family History  Problem Relation Age of Onset  . Congestive Heart Failure Father   . Heart disease Father   . Heart attack Father   . Atrial fibrillation Father   . Brain cancer Father   . Melanoma Father   . Diabetes Mother   . Stroke Mother   . Hypertension Mother   . Fainting Paternal Grandfather   . Congestive Heart Failure Paternal Grandfather   . Atrial fibrillation Paternal Grandfather     Social History   Tobacco Use  . Smoking status: Never Smoker  . Smokeless tobacco: Never Used  Vaping Use  . Vaping Use: Never used  Substance Use Topics  . Alcohol use: No  . Drug use: No      Home Medications Prior to Admission medications   Medication Sig Start Date End Date Taking? Authorizing Provider  acetaminophen (TYLENOL) 650 MG CR tablet Take 1,300 mg by mouth daily as needed for pain.    [provider]  allopurinol (ZYLOPRIM) 300 MG tablet Take 300 mg by mouth daily.  01/18/15   [provider]  atorvastatin (LIPITOR) 40 MG tablet Take 1 tablet (40 mg total) by mouth daily at 6 PM. 10/13/17   Tillery, Satira Mccallum, PA-C  citalopram (CELEXA) 20 MG tablet Take 20 mg by mouth daily.    [provider]  docusate sodium (COLACE) 100 MG capsule Take 100 mg by mouth daily as needed for mild constipation.     [provider]  fluticasone (FLONASE) 50 MCG/ACT nasal spray Place 1 spray into both nostrils daily.    [provider]  levocetirizine (XYZAL) 5 MG tablet Take 2.5 mg by mouth every other day.  [provider]  LORazepam (ATIVAN) 0.5 MG tablet Take 0.5 mg by mouth 2 (two) times daily as needed for anxiety.    [provider]  metFORMIN (GLUCOPHAGE) 500 MG tablet Take 500 mg by mouth daily with breakfast.    [provider]  midodrine (PROAMATINE) 5 MG tablet Take 1 tablet (5 mg total) by mouth See admin instructions. Take 1 tablet (5 mg) by mouth 3 times daily, except take an additional tablet (5 mg) by mouth at midday on center days to maintain adequate blood pressure. 08/27/19   Leonie Man, MD  Multiple Vitamin (MULTIVITAMIN WITH MINERALS) TABS tablet Take 1 tablet by mouth daily with breakfast.     [provider]  pantoprazole (PROTONIX) 40 MG tablet Take 40 mg by mouth 2 (two) times daily with a meal.     [provider]  potassium chloride SA (K-DUR,KLOR-CON) 20 MEQ tablet Take 1 tablet (20 mEq total) by mouth daily. 10/14/17   Shirley Friar, PA-C  Propylene Glycol (SYSTANE BALANCE OP) Place 1 drop into both eyes 2 (two) times daily as needed (dry eyes).     [provider]  senna-docusate (SENOKOT-S) 8.6-50 MG tablet Take 2 tablets by mouth at bedtime.    [provider]  torsemide (DEMADEX) 20 MG tablet Take 1 tablet (20 mg total) by mouth 2 (two) times daily. 08/27/19   Leonie Man, MD  traMADol (ULTRAM) 50 MG tablet Take 50 mg by mouth 2 (two) times daily as needed for severe pain.    [provider]  triamcinolone cream (KENALOG) 0.1 % Apply 1 application topically See admin instructions. Apply to arms daily    [provider]    Allergies    Codeine  Review of Systems   Review of Systems: 10 point review of systems negative; all others negative except as noted above in HPI.   Physical Exam Updated Vital Signs BP 112/78   Pulse 82   Temp 98.2 F (36.8 C) (Oral)   Resp 19   Ht _0  (1.575 m)   Wt 77.1 kg   SpO2 93%   BMI 31.09 kg/m   Physical Exam General: Patient is obese. He appears well in no acute distress. Eyes: Sclera non-icteric. No conjunctival injection.  HENT: Moist mucus membranes. No nasal discharge.  Respiratory: There are crackles with trace rhonchi bilaterally. No wheezing. Patient is breathing comfortably on 2.5L [place]. Cardiovascular: Regular rate and rhythm. No murmurs, rubs, or gallops. No lower extremity edema. Distal pulses are 1+ in all four extremities.  Abdominal: Tight and distended, without fluid wave. There is moderate LLQ tenderness to palpation with voluntary guarding but no rebound. Bowel sounds are hyperactive. There is no other TTP.  Neurological: Patient is alert and answering questions appropriately. He has 4/5 strength in bilateral lower extremities. Strength in upper extremities is 5/5, bilaterally.   Musculoskeletal: There is muscular atrophy of the bilateral lower extremities and hammer toe deformities of the feet, bilaterally. ROMI in all four extremities.  Skin: No lesions. No rashes.  Psych: Normal affect. Normal tone of voice.    ED Results /  Procedures / Treatments   Labs (all labs ordered are listed, but only abnormal results are displayed) Labs Reviewed  CBC WITH DIFFERENTIAL/PLATELET - Abnormal; Notable for the following components:      Result Value   Hemoglobin 12.3 (*)    RDW 18.0 (*)    All other components within normal limits  COMPREHENSIVE METABOLIC  PANEL - Abnormal; Notable for the following components:   Glucose, Bld 122 (*)    BUN 22 (*)    Creatinine, Ser 1.96 (*)    Calcium 8.8 (*)    Albumin 3.3 (*)    GFR, Estimated 39 (*)    All other components within normal limits  RESPIRATORY PANEL BY RT PCR (FLU A&B, COVID)  LIPASE, BLOOD  BRAIN NATRIURETIC PEPTIDE  URINALYSIS, ROUTINE W REFLEX MICROSCOPIC  TROPONIN I (HIGH SENSITIVITY)  TROPONIN I (HIGH SENSITIVITY)    EKG EKG Interpretation  Date/Time:  Monday March 03 2020 18:14:47 EDT Ventricular Rate:  82 PR Interval:    QRS Duration: 106 QT Interval:  412 QTC Calculation: 482 R Axis:   87 Text Interpretation: Sinus rhythm Multiple ventricular premature complexes Borderline prolonged QT interval , new Nonspecific T wave abnormality Confirmed by Blanchie Dessert [phone]) on 03/03/2020 6:17:38 PM   Radiology CT ABDOMEN PELVIS W CONTRAST  Result Date: 03/03/2020 CLINICAL DATA:  Lower abdominal pain EXAM: CT ABDOMEN AND PELVIS WITH CONTRAST TECHNIQUE: Multidetector CT imaging of the abdomen and pelvis was performed using the standard protocol following bolus administration of intravenous contrast. CONTRAST:  14m OMNIPAQUE IOHEXOL 300 MG/ML  SOLN COMPARISON:  04/05/2019 FINDINGS: Lower chest: Mild emphysematous changes are noted with air trapping. No focal infiltrate or sizable effusion is seen. 5 mm nodule is noted in the left lower lobe stable in appearance from a prior high-resolution CT scan in 2019. The distal esophagus demonstrates diffuse wall thickening similar to that seen on the prior exam. Additionally, there is an 18-19 mm short axis lymph  node adjacent to the distal esophagus best seen on the first image. This is incompletely evaluated. Hepatobiliary: No focal liver abnormality is seen. Status post cholecystectomy. No biliary dilatation. Pancreas: Unremarkable. No pancreatic ductal dilatation or surrounding inflammatory changes. Spleen: Normal in size without focal abnormality. Adrenals/Urinary Tract: Adrenal glands are within normal limits. Kidneys are well visualized bilaterally. No renal calculi are identified. No obstructive changes are seen. Some stable scarring in the upper portion of the right kidney is noted with associated cyst formation. No obstructive changes are seen. The bladder is well distended. Stomach/Bowel: The appendix is within normal limits. Colon shows no obstructive or inflammatory changes. Small bowel and stomach are within normal limits. Vascular/Lymphatic: Aortic atherosclerosis. No enlarged abdominal or pelvic lymph nodes. Reproductive: Prostate is unremarkable. Other: No abdominal wall hernia or abnormality. No abdominopelvic ascites. Musculoskeletal: Degenerative changes of the lumbar spine are noted. IMPRESSION: No acute abnormality is noted in the abdomen to correspond with the patient's given clinical history. Stable 5 mm nodule in the left lower lobe dating back to 2019. No further follow-up is recommended. Paraesophageal lymph node measuring up to 19 mm in short axis. This is incompletely evaluated on this exam. This was partially visualized on a prior CT examination from June of 2019 but appears mildly enlarged. Nonemergent CT of the chest can be performed for further evaluation. Electronically Signed   By: MInez CatalinaM.D.   On: 03/03/2020 19:57   DG Chest Port 1 View  Result Date: 03/03/2020 CLINICAL DATA:  59year old male with hypoxia. EXAM: PORTABLE CHEST 1 VIEW COMPARISON:  Portable chest.  01/24/2019 and earlier FINDINGS: Portable AP semi upright view at 1601 hours. Stable lung volumes and mediastinal  contours. Chronic cardiomegaly. Improved ventilation at the lung bases, especially the left. No pneumothorax or pleural effusion. Mild pulmonary vascular congestion but no overt edema. No acute osseous abnormality identified. IMPRESSION: Chronic  cardiomegaly with vascular congestion but no overt edema. Electronically Signed   By: Genevie Ann M.D.   On: 03/03/2020 16:14    Procedures Procedures (including critical care time)  Medications Ordered in ED Medications  midodrine (PROAMATINE) tablet 5 mg (5 mg Oral Given 03/03/20 1914)  iohexol (OMNIPAQUE) 300 MG/ML solution 100 mL (80 mLs Intravenous Contrast Given 03/03/20 1930)    ED Course  I have reviewed the triage vital signs and the nursing notes.  Pertinent labs & imaging results that were available during my care of the patient were reviewed by me and considered in my medical decision making (see chart for details).    MDM Rules/Calculators/A&P                          Mr. Ledwell presents with vague abdominal pain, but does have LLQ abdominal tenderness on examination. No concern for acute abdomen at this time. He was also noted to be hypoxic at [facility] with SpO2 77%, requiring supplemental O2 via [place]. Patient continues to require 2L [place] here and desaturates to the 70's on room air at rest. He otherwise remains hemodynamically stable.   Differential for his abdominal pain is broad and includes diverticulitis, constipation, hernia, LLL pneumonia, CHF exacerbation, nephrolithiasis, cystitis, PUD, pancreatitis. Will get CT abdomen / pelvis with oral contrast if kidney function allows as well as lipase, CMP, CBC with differential to rule out abdominal source. Will check urinalysis to rule out urinary source an CXR to rule out pulmonary source. Will check EKG and BNP to assess for CHF exacerbation. Will also check respiratory panel to r/o COVID-19 and influenza.   9:21 PM CXR shows chronic cardiomegaly with vascular congestion, without significant edema  concerning for acute CHF exacerbation. No focal opacification to suggest a typical pneumonia.  Respiratory pathogen panel negative for Influenza or COVID-19.  EKG shows NSR at 82 bpm with a couple of PVC's with normal PR intervals and borderline prolonged QTc intervals. There are non-specific T wave inversions in leads V1 and V2 also seen on previous EKG's and new aVL T wave inversions. No ST segment changes. No concern for acute ischemia.   9:21 PM CBC shows mild anemia, Hgb 12.3, MCV 90.1, RDW 18.0, consistent with likely IDA and Anemia of Chronic Disease. No leukocytosis. U/A unremarkable, making urinary or infectious causes unlikely.   9:21 PM Patient continues to require 3L [place] with saturations of 92%. Blood pressures remain soft. Will restart home midodrine 5m.  CMP shows creatinine 1.96, BUN 22, GFR 39 - will get CT abdomen pelvis with contrast  BNP 25.8, troponin 7 - reassuring that symptoms are not secondary to CHF exacerbation or cardiac ischemia. Lipase 49 without epigastric TTP, reassuring that symptoms are not from pancreatitis.   9:21 PM CT Abdomen Pelvis with contrast shows a stable 569mLLL nodule (from 2019 - no further follow-up recommended) with emphysematous changes, and enlarged paraesophageal LN 1928mn short axis (recommended nonemergent chest CT follow-up) although no acute findings to explain patient's abdominal pain.   Given unremarkable work-up here, will try food challenge and check ambulatory pulse ox.  If O2 sats okay with ambulation and patient able to tolerate PO intake, will discharge home with home O2.   9:21 PM Patient was reassessed and was saturating at 96-99% on room air, sitting up comfortably in his chair. His pulse ox was 90% while ambulating and he was asymptomatic. Patient was able to tolerate graham crackers and  PO fluids without return of pain, nausea, or other symptoms. Patient and mother are eager to return home.   Patient's hypoxia was likely  positional in nature (due to OHS vs. OSA) vs. Worsening pulmonary HTN complicated by chronic hypotension and hx of aortic stenosis noted on ECHO last year. Patient's oxygen saturations drastically improved sitting up after receiving his evening dose of midodrine, consistent with the above.   Considering unremarkable workup as detailed above, patient is safe for discharge to home with oxygen use at night as prescribed. Patient was given return instructions with emphasis on follow up with his pulmonologist for further outpatient workup and non-urgent CT chest.      Final Clinical Impression(s) / ED Diagnoses Final diagnoses:  Acute and chronic respiratory failure with hypoxia ([place])  Nonspecific abdominal pain  - acute respiratory failure likely 2/2 worsening pHTN in setting of AS and missed midodrine dose vs. OHS/OSA  Rx / DC Orders ED Discharge Orders    None     Jeralyn Bennett, MD 03/03/2020, 9:21 PM Pager: [phone]    Jeralyn Bennett, MD 03/03/20 2121    Blanchie Dessert, MD 03/03/20 2235

## 2020-08-28 ENCOUNTER — Ambulatory Visit (INDEPENDENT_AMBULATORY_CARE_PROVIDER_SITE_OTHER): Payer: Medicare (Managed Care) | Admitting: Cardiology

## 2020-08-28 ENCOUNTER — Encounter: Payer: Self-pay | Admitting: Cardiology

## 2020-08-28 ENCOUNTER — Other Ambulatory Visit: Payer: Self-pay

## 2020-08-28 VITALS — BP 108/70 | HR 87 | Wt 179.4 lb

## 2020-08-28 DIAGNOSIS — G4733 Obstructive sleep apnea (adult) (pediatric): Secondary | ICD-10-CM

## 2020-08-28 DIAGNOSIS — I951 Orthostatic hypotension: Secondary | ICD-10-CM | POA: Diagnosis not present

## 2020-08-28 DIAGNOSIS — I5032 Chronic diastolic (congestive) heart failure: Secondary | ICD-10-CM | POA: Diagnosis not present

## 2020-08-28 DIAGNOSIS — I50810 Right heart failure, unspecified: Secondary | ICD-10-CM | POA: Diagnosis not present

## 2020-08-28 DIAGNOSIS — I272 Pulmonary hypertension, unspecified: Secondary | ICD-10-CM | POA: Diagnosis not present

## 2020-08-28 NOTE — Progress Notes (Signed)
Primary Care Provider: [facility] Cardiologist: Glenetta Hew, MD  Advanced CHF: Dr. Aundra Dubin Electrophysiologist: None  Clinic Note: No chief complaint on file.   ===================================  ASSESSMENT/PLAN   Problem List Items Addressed This Visit    RVF (right ventricular failure) ([place]) - Primary    On stable regimen of Demadex.  Edema seems to be pretty well controlled.  Abdominal bloating is also improved. Continue supplemental oxygen.      Pulmonary hypertension (HCC) (Chronic)    No longer tolerating pulmonary vasodilators.  No signs of volume overload. Continuing midodrine to keep his pressures stable, therefore unable to tolerate vasodilatory medications.  Continue oxygen and current dose diuretic.      Relevant Orders   EKG 12-Lead (Completed)   Chronic diastolic heart failure (HCC) (Chronic)    Not really diastolic dysfunction.  Right heart failure.      Relevant Orders   EKG 12-Lead (Completed)   OSA (obstructive sleep apnea) (Chronic)    Unable to tolerate CPAP.  Using nighttime oxygen.      Postural hypotension (Chronic)    Continue midodrine standing dose.  Has not required additional dosing.       ===================================  HPI:    Tony Jones is a 60 y.o. male with a PMH Notable for Severe OSA/OHS/PAH (group 2 and group 3) with chronic HFpEF who presents today for annual follow-up.  Tony Jones was last seen on 08/27/2019 accompanied by his mother as usual.  Was doing fairly well.  Not really having significant dyspnea issues.  Using nighttime oxygen, but not able to tolerate CPAP.  Occasionally having to use oxygen during the day, but rarely.  Abdominal pain improved.  Able to walk around the house and do some activities at the "leg ".  He also goes to adult daycare during the day and enjoys walking around.  Is quite active there.  Recent Hospitalizations:   ER visit on March 03, 2020  with acute on chronic respiratory failure/hypoxia.  Reviewed  CV studies:    The following studies were reviewed today: (if available, images/films reviewed: From Epic Chart or Care Everywhere) . None:  Interval History:   Tony Jones presents here today with his mother.  Actually doing pretty well.  He is more concerned about the rash on his neck and back and how uncomfortable it is.  For the most part he is only using oxygen at night only rarely to the day if he overdoes it.  He does not really do a lot of activity but is engage in the activities at his adult daycare.  [place] and walking around, but does not do a lot of exertion.  No PND or orthopnea.  No complaints of rapid irregular heartbeats or palpitations.  He is slowly losing weight because some days he just does not feel like eating.  Not really associated with abdominal discomfort.  CV Review of Symptoms (Summary): positive for - dyspnea on exertion, edema, shortness of breath and No change from baseline. negative for - chest pain, irregular heartbeat, orthopnea, palpitations, paroxysmal nocturnal dyspnea, rapid heart rate or Syncope/near syncope or TIA/amaurosis fugax, claudication  The patient does not have symptoms concerning for COVID-19 infection (fever, chills, cough, or new shortness of breath).   REVIEWED OF SYSTEMS   Review of Systems  Constitutional: Negative for malaise/fatigue and weight loss.  HENT: Negative for congestion and nosebleeds.   Respiratory: Positive for  shortness of breath (On occasion, he has to use oxygen during the daytime otherwise not routinely). Negative for cough and wheezing.   Cardiovascular: Positive for leg swelling (Off-and-on, trivial).  Gastrointestinal: Negative for abdominal pain, blood in stool and melena.  Genitourinary: Negative for hematuria.  Musculoskeletal: Positive for joint pain. Negative for falls.  Skin: Positive for rash (He has a rash on his neck and  back.  They think it may be related to reaction to caffeine.  He broke out after having several cans of soda.).  Neurological: Negative for dizziness, weakness and headaches.  Psychiatric/Behavioral: Negative for depression. The patient is not nervous/anxious and does not have insomnia.    I have reviewed and (if needed) personally updated the patient's problem list, medications, allergies, past medical and surgical history, social and family history.   PAST MEDICAL HISTORY   Past Medical History:  Diagnosis Date  . Anemia   . Anxiety   . Barrett esophagus   . Cholelithiasis   . Chronic respiratory failure with hypoxia ([place])   . CKD (chronic kidney disease) stage 3, GFR 30-59 ml/min (HCC)   . Developmental delay   . Diabetes mellitus type 2, uncontrolled ([place])   . Dyslipidemia   . Gallstones 01/2019  . GERD (gastroesophageal reflux disease)   . Hypertension   . Mental disorder    Chronic developmental delay.; still lives with mother as primary care giver; spends 2-3 days in adult day-care.  . Nephrolithiasis   . Pulmonary hypertension ([place])    Likely related to long-standing hypoxia and possible OSA/OHS (group 3) & ~DHF (Group 2); -- No longer on Pulmnoary Vasodilator (family request)  . Right heart failure [facility])    Recovered as on July 2020    PAST SURGICAL HISTORY   Past Surgical History:  Procedure Laterality Date  . CHOLECYSTECTOMY N/A 01/24/2019   Procedure: LAPAROSCOPIC CHOLECYSTECTOMY WITH INTRAOPERATIVE CHOLANGIOGRAM;  Surgeon: Donnie Mesa, MD;  Location: [facility];  Service: General;  Laterality: N/A;  . CYSTOSCOPY/URETEROSCOPY/HOLMIUM LASER/STENT PLACEMENT Right 11/14/2017   Procedure: CYSTOSCOPY/URETEROSCOPY/RETROGRADE/STENT PLACEMENT;  Surgeon: [name], MD;  Location: [facility];  Service: Urology;  Laterality: Right;  75 MINS  SPINAL OR MAC ANESTHESIA  ALSO EXTRACTION OF STONE  . ESOPHAGOGASTRODUODENOSCOPY N/A 08/02/2017   Procedure: ESOPHAGOGASTRODUODENOSCOPY  (EGD);  Surgeon: [name], MD;  Location: [facility];  Service: Endoscopy;  Laterality: N/A;  . GIVENS CAPSULE STUDY N/A 10/12/2017   Procedure: GIVENS CAPSULE STUDY;  Surgeon: [name], MD;  Location: [place] [facility];  Service: Endoscopy;  Laterality: N/A;  . 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 (R>L). RAP 16 mmHg, RVP 92/16 mmHg; PAP 93/51 mmHg mean 67, PCWP mn27 mmHg. LVP 110/21 - EDP 21 mmHg. AoP 115/75 mmHg.  PaO2 56%, AoO2 99%. CO/I (Fick) 4.95-2.65, PVR 8 WU, PAPi 2.65 (PVR 8 WU)  . THROAT SURGERY    . TRANSTHORACIC ECHOCARDIOGRAM  01/2015   Mild concentric LVH.  EF 60 to 65%.  GR 1 DD, but high filling pressures.  IVS had diastolic and systolic flattening consistent with volume and pressure overload.  PA pressures estimated 75 mmHg.  Marland Kitchen TRANSTHORACIC ECHOCARDIOGRAM  11/2018   Normal LV size and function.  EF 55 to 60%.  Moderate basal septal hypertrophy.  GR 1 DD.  No R WMA.  Normal RV size with preserved/normal function.  Moderate aortic sclerosis but no stenosis.  Mild aortic root dilation at 38 mm.  Lipomatous IAS   Right and Left Heart Cath -October 05, 2017: Nonobstructive CAD. Severe PAH, elevated Left and Right Filling Pressures RA 16 ; RV 92/16 PA 93/51 mean 67; PCWP mean 27 LV 110/21;  AO 115/75  Oxygen saturations: PA 56%, O 99%  Cardiac Output (Fick) 4.95 ; Cardiac Index (Fick) 2.65-preserved PVR 8 WU / PAPi 2.65     Immunization History  Administered Date(s) Administered  . Influenza,inj,Quad PF,6+ Mos 02/01/2015    MEDICATIONS/ALLERGIES   Current Meds  Medication Sig  . acetaminophen (TYLENOL) 650 MG CR tablet Take 1,300 mg by mouth daily as needed for pain.  Marland Kitchen allopurinol (ZYLOPRIM) 300 MG tablet Take 300 mg by mouth daily.   Marland Kitchen atorvastatin (LIPITOR) 40 MG tablet Take 1  tablet (40 mg total) by mouth daily at 6 PM.  . citalopram (CELEXA) 20 MG tablet Take 20 mg by mouth daily.  Marland Kitchen docusate sodium (COLACE) 100 MG capsule Take 100 mg by mouth daily as needed for mild constipation.   . fluticasone (FLONASE) 50 MCG/ACT nasal spray Place 1 spray into both nostrils daily.  Marland Kitchen levocetirizine (XYZAL) 5 MG tablet Take 2.5 mg by mouth every other day.   Marland Kitchen LORazepam (ATIVAN) 0.5 MG tablet Take 0.5 mg by mouth 2 (two) times daily as needed for anxiety.  . metFORMIN (GLUCOPHAGE) 500 MG tablet Take 500 mg by mouth daily with breakfast.  . midodrine (PROAMATINE) 5 MG tablet Take 1 tablet (5 mg total) by mouth See admin instructions. Take 1 tablet (5 mg) by mouth 3 times daily, except take an additional tablet (5 mg) by mouth at midday on center days to maintain adequate blood pressure.  . Multiple Vitamin (MULTIVITAMIN WITH MINERALS) TABS tablet Take 1 tablet by mouth daily with breakfast.   . pantoprazole (PROTONIX) 40 MG tablet Take 40 mg by mouth 2 (two) times daily with a meal.   . potassium chloride SA (K-DUR,KLOR-CON) 20 MEQ tablet Take 1 tablet (20 mEq total) by mouth daily.  Marland Kitchen Propylene Glycol (SYSTANE BALANCE OP) Place 1 drop into both eyes 2 (two) times daily as needed (dry eyes).  Marland Kitchen senna-docusate (SENOKOT-S) 8.6-50 MG tablet Take 2 tablets by mouth at bedtime.  . torsemide (DEMADEX) 20 MG tablet Take 1 tablet (20 mg total) by mouth 2 (two) times daily.  . traMADol (ULTRAM) 50 MG tablet Take 50 mg by mouth 2 (two) times daily as needed for severe pain.  Marland Kitchen triamcinolone cream (KENALOG) 0.1 % Apply 1 application topically See admin instructions. Apply to arms daily    Allergies  Allergen Reactions  . Codeine Nausea And Vomiting    SOCIAL HISTORY/FAMILY HISTORY   Reviewed in Epic:  Pertinent findings:  Social History   Tobacco Use  . Smoking status: Never Smoker  . Smokeless tobacco: Never Used  Vaping Use  . Vaping Use: Never used  Substance Use Topics  .  Alcohol use: No  . Drug use: No   Social History   Social History Narrative  . Not on file    OBJCTIVE -PE, EKG, labs   Wt Readings from Last 3 Encounters:  08/28/20 179 lb 6.4 oz (81.4 kg)  03/03/20 170 lb (77.1 kg)  08/27/19 183 lb 9.6 oz (83.3 kg)    Physical Exam: BP 108/70   Pulse 87   Wt 179 lb 6.4 oz (81.4 kg)   SpO2 97%   BMI 32.81  kg/m  Physical Exam Vitals reviewed.  Constitutional:      General: He is not in acute distress.    Appearance: Normal appearance. He is obese. He is not ill-appearing or toxic-appearing.     Comments: Doing pretty well today.  Wears his face shield as opposed to mask.  HENT:     Head: Normocephalic and atraumatic.     Comments: Normal baseline features Neck:     Vascular: No carotid bruit, hepatojugular reflux or JVD.  Cardiovascular:     Rate and Rhythm: Normal rate and regular rhythm.  No extrasystoles are present.    Chest Wall: PMI is not displaced (Difficult to palpate).     Pulses: Decreased pulses (Decreased but palpable.).     Heart sounds: S1 normal and S2 normal. Heart sounds are distant. No murmur heard. No friction rub. No gallop.   Pulmonary:     Effort: Pulmonary effort is normal. No respiratory distress.     Breath sounds: Normal breath sounds.  Chest:     Chest wall: No tenderness.  Musculoskeletal:        General: Swelling (Trivial ankle) present.     Cervical back: Normal range of motion and neck supple.  Lymphadenopathy:     Cervical: No cervical adenopathy.  Skin:    General: Skin is warm and dry.  Neurological:     General: No focal deficit present.     Mental Status: He is alert and oriented to person, place, and time. Mental status is at baseline.     Comments: In normal mood for him, but more interactive today.  Psychiatric:        Mood and Affect: Mood normal.        Behavior: Behavior normal.     Comments: Seems to be in a good mood.  Happy.     Adult ECG Report  Rate: 87;  Rhythm: normal  sinus rhythm and Nonspecific ST and T wave changes.  Otherwise normal axis, intervals and durations.;   Narrative Interpretation: Stable  Recent Labs: No recent labs. Lab Results  Component Value Date   CHOL 175 07/04/2018   HDL 31 (L) 07/04/2018   LDLCALC 79 07/04/2018   TRIG 324 (H) 07/04/2018   CHOLHDL 5.6 07/04/2018   Lab Results  Component Value Date   CREATININE 1.96 (H) 03/03/2020   BUN 22 (H) 03/03/2020   NA 140 03/03/2020   K 4.7 03/03/2020   CL 98 03/03/2020   CO2 32 03/03/2020   CBC Latest Ref Rng & Units 03/03/2020 01/25/2019 01/25/2019  WBC 4.0 - 10.5 K/uL 8.9 12.7(H) 12.1(H)  Hemoglobin 13.0 - 17.0 g/dL 12.3(L) 13.3 13.6  Hematocrit 39.0 - 52.0 % 40.2 39.0 39.1  Platelets 150 - 400 K/uL 172 139(L) 139(L)    Lab Results  Component Value Date   TSH 1.985 09/30/2017    ==================================================  COVID-19 Education: The signs and symptoms of COVID-19 were discussed with the patient and how to seek care for testing (follow up with PCP or arrange E-visit).   The importance of social distancing and COVID-19 vaccination was discussed today. The patient is practicing social distancing & Masking.   I spent a total of 16mnutes with the patient spent in direct patient consultation.  Additional time spent with chart review  / charting (studies, outside notes, etc): 968m Total Time: 2976m  Current medicines are reviewed at length with the patient today.  (+/- concerns) none  This visit occurred during the SARS-CoV-2 public  health emergency.  Safety protocols were in place, including screening questions prior to the visit, additional usage of staff PPE, and extensive cleaning of exam room while observing appropriate contact time as indicated for disinfecting solutions.  Notice: This dictation was prepared with Dragon dictation along with smaller phrase technology. Any transcriptional errors that result from this process are unintentional and may  not be corrected upon review.  Patient Instructions / Medication Changes & Studies & Tests Ordered   Patient Instructions  Medication Instructions:  No changes  *If you need a refill on your cardiac medications before your next appointment, please call your pharmacy*   Lab Work: Recommend checking  Lipid and CMP at least twice a year when other labs are obtained( 6 months)  If you have labs (blood work) drawn today and your tests are completely normal, you will receive your results only by: Marland Kitchen MyChart Message (if you have MyChart) OR . A paper copy in the mail If you have any lab test that is abnormal or we need to change your treatment, we will call you to review the results.   Testing/Procedures: Not needed   Follow-Up: At [facility], you and your health needs are our priority.  As part of our continuing mission to provide you with exceptional heart care, we have created designated Provider Care Teams.  These Care Teams include your primary Cardiologist (physician) and Advanced Practice Providers (APPs -  Physician Assistants and Nurse Practitioners) who all work together to provide you with the care you need, when you need it.     Your next appointment:   12 month(s)  The format for your next appointment:   In Person  Provider:   Glenetta Hew, MD   Other Instructions   [employer]   [phone]   Referred by Luretha Murphy    Studies Ordered:   Orders Placed This Encounter  Procedures  . EKG 12-Lead     Glenetta Hew, M.D., M.S. Interventional Cardiologist   Pager # [phone] Phone # [phone] [address] [place], [place] [address]   Thank you for choosing Heartcare at [facility]!!

## 2020-08-28 NOTE — Patient Instructions (Signed)
Medication Instructions:  No changes  *If you need a refill on your cardiac medications before your next appointment, please call your pharmacy*   Lab Work: Recommend checking  Lipid and CMP at least twice a year when other labs are obtained( 6 months)  If you have labs (blood work) drawn today and your tests are completely normal, you will receive your results only by: Marland Kitchen MyChart Message (if you have MyChart) OR . A paper copy in the mail If you have any lab test that is abnormal or we need to change your treatment, we will call you to review the results.   Testing/Procedures: Not needed   Follow-Up: At [facility], you and your health needs are our priority.  As part of our continuing mission to provide you with exceptional heart care, we have created designated Provider Care Teams.  These Care Teams include your primary Cardiologist (physician) and Advanced Practice Providers (APPs -  Physician Assistants and Nurse Practitioners) who all work together to provide you with the care you need, when you need it.     Your next appointment:   12 month(s)  The format for your next appointment:   In Person  Provider:   Glenetta Hew, MD   Other Instructions   [employer]   [phone]   Referred by Montine Circle and Estelle June

## 2020-09-06 ENCOUNTER — Encounter: Payer: Self-pay | Admitting: Cardiology

## 2020-09-06 NOTE — Assessment & Plan Note (Signed)
Unable to tolerate CPAP.  Using nighttime oxygen.

## 2020-09-06 NOTE — Assessment & Plan Note (Signed)
On stable regimen of Demadex.  Edema seems to be pretty well controlled.  Abdominal bloating is also improved. Continue supplemental oxygen.

## 2020-09-06 NOTE — Assessment & Plan Note (Signed)
No longer tolerating pulmonary vasodilators.  No signs of volume overload. Continuing midodrine to keep his pressures stable, therefore unable to tolerate vasodilatory medications.  Continue oxygen and current dose diuretic.

## 2020-09-06 NOTE — Assessment & Plan Note (Signed)
Not really diastolic dysfunction.  Right heart failure.

## 2020-09-06 NOTE — Assessment & Plan Note (Signed)
Continue midodrine standing dose.  Has not required additional dosing.

## 2020-09-30 ENCOUNTER — Ambulatory Visit
Admission: RE | Admit: 2020-09-30 | Discharge: 2020-09-30 | Disposition: A | Discharge status: Home / Self Care | Payer: Medicare (Managed Care) | Source: Ambulatory Visit | Attending: Family Medicine | Admitting: Family Medicine

## 2020-09-30 ENCOUNTER — Other Ambulatory Visit: Payer: Self-pay

## 2020-09-30 ENCOUNTER — Other Ambulatory Visit: Payer: Self-pay | Admitting: Family Medicine

## 2020-09-30 DIAGNOSIS — J9611 Chronic respiratory failure with hypoxia: Secondary | ICD-10-CM

## 2020-10-07 ENCOUNTER — Other Ambulatory Visit: Payer: Self-pay | Admitting: Family Medicine

## 2020-10-07 ENCOUNTER — Other Ambulatory Visit (HOSPITAL_COMMUNITY): Payer: Self-pay | Admitting: Family Medicine

## 2020-10-13 ENCOUNTER — Other Ambulatory Visit: Payer: Self-pay | Admitting: Family Medicine

## 2020-10-13 DIAGNOSIS — R918 Other nonspecific abnormal finding of lung field: Secondary | ICD-10-CM

## 2020-10-27 ENCOUNTER — Ambulatory Visit
Admission: RE | Admit: 2020-10-27 | Discharge: 2020-10-27 | Disposition: A | Discharge status: Home / Self Care | Payer: Medicare (Managed Care) | Source: Ambulatory Visit | Attending: Family Medicine | Admitting: Family Medicine

## 2020-10-27 ENCOUNTER — Other Ambulatory Visit: Payer: Self-pay

## 2020-10-27 DIAGNOSIS — R918 Other nonspecific abnormal finding of lung field: Secondary | ICD-10-CM

## 2021-07-31 ENCOUNTER — Encounter: Payer: Self-pay | Admitting: Cardiology

## 2021-07-31 ENCOUNTER — Ambulatory Visit (INDEPENDENT_AMBULATORY_CARE_PROVIDER_SITE_OTHER): Payer: Medicare (Managed Care) | Admitting: Cardiology

## 2021-07-31 VITALS — BP 116/66 | HR 95 | Ht 61.0 in | Wt 180.6 lb

## 2021-07-31 DIAGNOSIS — I10 Essential (primary) hypertension: Secondary | ICD-10-CM | POA: Diagnosis not present

## 2021-07-31 DIAGNOSIS — G4733 Obstructive sleep apnea (adult) (pediatric): Secondary | ICD-10-CM

## 2021-07-31 DIAGNOSIS — I5032 Chronic diastolic (congestive) heart failure: Secondary | ICD-10-CM

## 2021-07-31 DIAGNOSIS — I272 Pulmonary hypertension, unspecified: Secondary | ICD-10-CM

## 2021-07-31 DIAGNOSIS — E668 Other obesity: Secondary | ICD-10-CM

## 2021-07-31 DIAGNOSIS — I50812 Chronic right heart failure: Secondary | ICD-10-CM

## 2021-07-31 NOTE — Progress Notes (Signed)
? ? ?Primary Care Provider: [facility] ?Cardiologist: Tony Hew, MD ?Electrophysiologist: None ? ?Clinic Note: ?No chief complaint on file. ? ? ?=================================== ? ?ASSESSMENT/PLAN  ? ?Problem List Items Addressed This Visit   ? ?  ? Cardiology Problems  ? Pulmonary hypertension (HCC) (Chronic)  ?  Never tolerated the pulmonary vasodilators.  There was concern about cross-reactivity with other medications and/or activities. ?Remains on home oxygen for activity.  He does not usually use it when he is sitting and not doing things, with any exertion he is and also uses CPAP for sleep. ? ?  ?  ? Relevant Medications  ? torsemide (DEMADEX) 20 MG tablet  ? Other Relevant Orders  ? EKG 12-Lead (Completed)  ? Hypertension  ?  Blood pressure well controlled.  This is on only torsemide.  He is using midodrine to help maintain weight. ? ?Doing well.  Feeling well.  No changes. ? ?  ?  ? Relevant Medications  ? torsemide (DEMADEX) 20 MG tablet  ? Chronic diastolic heart failure (HCC) - Primary (Chronic)  ?  Volume level seems to be stable.  He was just put on a higher dose of furosemide for little bit.  Stabling out. ? ? ? ?Did just get [facility], unfortunately not available. ? ?  ?  ? Relevant Medications  ? torsemide (DEMADEX) 20 MG tablet  ? Other Relevant Orders  ? EKG 12-Lead (Completed)  ? Right heart failure (HCC) (Chronic)  ?  Not really having any signs symptoms of CHF either right or left-sided.  In fact he has been hypotensive requiring midodrine to bring his pressures up.  I would be really reluctant to do much more than just diuretic. ?Plan: Continue torsemide-discussed sliding scale. ? ?  ?  ? Relevant Medications  ? torsemide (DEMADEX) 20 MG tablet  ?  ? Other  ? Moderate obesity (Chronic)  ?  Please work on weight loss. ? ?  ?  ? Relevant Medications  ? metFORMIN (GLUCOPHAGE) 500 MG tablet  ? OSA (obstructive sleep apnea) (Chronic)  ?  Needs  stable CPAP ? ?  ?  ? ? ?=================================== ? ?HPI:   ? ?Tony Jones is a 61 y.o. 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 ? ?Tony Jones was last seen in April 2022 (as usual accompanied by his mother).  He was doing very well.  More concerned about a rash on his back than anything else.  He does get short of breath, but really only uses oxygen at night and sometimes the day if he overdoes it gets very dyspneic.  Not very active at baseline, but does engage in a lot of activities at the [employer] [facility]-because at school.  But he also likes it when they go down to their [place] he [place], but does not go very far..  With this type of activity he does okay with no significant dyspnea. ?Thankfully, he really has not had much in the way of any PND or orthopnea.  No real edema.  They have pay close attention to his volume status.  He takes the [facility] sometimes 1 or 2 times a day depending on his symptoms.  He and his mother report how much he waits  and therefore I much for how much less diuretic that [name] takes he takes. ? ?Recent Hospitalizations: None ? ?Reviewed  CV studies:   ? ?The following studies were reviewed today: (if available, images/films reviewed: From Epic Chart or Care Everywhere) ?None: ? ? ?Interval History:  ? ?Tony Jones returns for follow-up doing pretty well.  He has not really had much in the way of any significant dizziness.  He does take the midodrine up to 3 times a day and potentially a second dose in the mid day to avoid orthostatic dizziness.  This seems to be well controlled.  ? ?His mother tries to keep him active and doing exercise.  He does not usually complain of exertional dyspnea. ?Thankfully, he is not really having any irregular heartbeats palpitations symptoms now. ? ?He  does still have his shortness of breath episodes, but they are pretty well controlled and that they put him on oxygen if he has a significant episode.  He said that he has to stay active. ? ? ?CV Review of Symptoms (Summary) ?Cardiovascular ROS: positive for - dyspnea on exertion, edema, shortness of breath, and pretty much noted in HPI ?negative for - chest pain, orthopnea, palpitations, paroxysmal nocturnal dyspnea, rapid heart rate, or syncope/near syncope or TIA/amaurosis fugax clinical claudication ? ?REVIEWED OF SYSTEMS  ? ?Review of Systems  ?Constitutional:  Negative for malaise/fatigue and weight loss.  ?HENT:  Negative for ear discharge and nosebleeds.   ?Respiratory:  Positive for cough, shortness of breath and wheezing (Sometimes).   ?Cardiovascular:  Negative for leg swelling and PND.  ?Musculoskeletal:  Positive for joint pain. Negative for falls.  ?Neurological:  Positive for dizziness. Negative for loss of consciousness and headaches.  ?Psychiatric/Behavioral:  Negative for depression and memory loss. The patient is nervous/anxious. The patient does not have insomnia.   ? ? ?I have reviewed and (if needed) personally updated the patient's problem list, medications, allergies, past medical and surgical history, social and family history.  ? ?PAST MEDICAL HISTORY  ? ?Past Medical History:  ?Diagnosis Date  ? Anemia   ? Anxiety   ? Barrett esophagus   ? Cholelithiasis   ? Chronic respiratory failure with hypoxia (HCC)   ? CKD (chronic kidney disease) stage 3, GFR 30-59 ml/min (HCC)   ? Developmental delay   ? Diabetes mellitus type 2, uncontrolled   ? Dyslipidemia   ? Gallstones 01/2019  ? GERD (gastroesophageal reflux disease)   ? Hypertension   ? Mental disorder   ? Chronic developmental delay.; still lives with mother as primary care giver; spends 2-3 days in adult day-care.  ? Nephrolithiasis   ? Pulmonary hypertension ([place])   ? Likely related to long-standing hypoxia and possible OSA/OHS (group 3)  & ~DHF (Group 2); -- No longer on Pulmnoary Vasodilator (family request)  ? Right heart failure ([place])   ? Recovered as on July 2020  ? ? ?PAST SURGICAL HISTORY  ? ?Past Surgical History:  ?Procedure Laterality Date  ? CHOLECYSTECTOMY N/A 01/24/2019  ? Procedure: LAPAROSCOPIC CHOLECYSTECTOMY WITH INTRAOPERATIVE CHOLANGIOGRAM;  Surgeon: Donnie Mesa, MD;  Location: [facility];  Service: General;  Laterality: N/A;  ? CYSTOSCOPY/URETEROSCOPY/HOLMIUM LASER/STENT PLACEMENT Right 11/14/2017  ? Procedure: CYSTOSCOPY/URETEROSCOPY/RETROGRADE/STENT PLACEMENT;  Surgeon: [name], MD;  Location: [facility];  Service: Urology;  Laterality: Right;  75 MINS ? SPINAL OR MAC ANESTHESIA ? ALSO EXTRACTION OF STONE  ? ESOPHAGOGASTRODUODENOSCOPY N/A 08/02/2017  ? Procedure: ESOPHAGOGASTRODUODENOSCOPY (EGD);  Surgeon: [name], MD;  Location: [facility];  Service: Endoscopy;  Laterality: N/A;  ? GIVENS CAPSULE STUDY N/A 10/12/2017  ? Procedure: GIVENS CAPSULE STUDY;  Surgeon: [name], MD;  Location: [facility];  Service: Endoscopy;  Laterality: N/A;  ? RIGHT/LEFT HEART CATH AND CORONARY ANGIOGRAPHY N/A 10/05/2017  ? 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 (R>L). RAP 16 mmHg, RVP 92/16 mmHg; PAP 93/51 mmHg mean 67, PCWP mn27 mmHg. LVP 110/21 - EDP 21 mmHg. AoP 115/75 mmHg.  PaO2 56%, AoO2 99%. CO/I (Fick) 4.95-2.65, PVR 8 WU, PAPi 2.65 (PVR 8 WU)  ? THROAT SURGERY    ? TRANSTHORACIC ECHOCARDIOGRAM  01/2015  ? Mild concentric LVH.  EF 60 to 65%.  GR 1 DD, but high filling pressures.  IVS had diastolic and systolic flattening consistent with volume and pressure overload.  PA pressures estimated 75 mmHg.  ? TRANSTHORACIC ECHOCARDIOGRAM  11/2018  ? Normal LV size and function.  EF 55 to 60%.  Moderate basal septal hypertrophy.  GR 1 DD.  No R WMA.  Normal RV size with preserved/normal function.  Moderate aortic  sclerosis but no stenosis.  Mild aortic root dilation at 38 mm.  Lipomatous IAS  ? ? ?Immunization History  ?Administered Date(s) Administered  ? Influenza,inj,Quad PF,6+ Mos 02/01/2015  ? ? ?MEDICATIONS

## 2021-07-31 NOTE — Patient Instructions (Addendum)
Medication Instructions:  ?Continue same medications ? ?May take a extra Lasix on the days you have increased swelling ? ?*If you need a refill on your cardiac medications before your next appointment, please call your pharmacy* ? ? ?Lab Work: ?None ordered ? ? ?Testing/Procedures: ?None ordered ? ? ?Follow-Up: ?At [facility], you and your health needs are our priority.  As part of our continuing mission to provide you with exceptional heart care, we have created designated Provider Care Teams.  These Care Teams include your primary Cardiologist (physician) and Advanced Practice Providers (APPs -  Physician Assistants and Nurse Practitioners) who all work together to provide you with the care you need, when you need it. ? ?We recommend signing up for the patient portal called "MyChart".  Sign up information is provided on this After Visit Summary.  MyChart is used to connect with patients for Virtual Visits (Telemedicine).  Patients are able to view lab/test results, encounter notes, upcoming appointments, etc.  Non-urgent messages can be sent to your provider as well.   ?To learn more about what you can do with MyChart, go to [email or web address].   ? ?Your next appointment:  1 year ?  ? ?The format for your next appointment: Office ? ? ?Provider:  Dr.Harding ? ? ?

## 2021-09-05 ENCOUNTER — Encounter: Payer: Self-pay | Admitting: Cardiology

## 2021-09-05 NOTE — Assessment & Plan Note (Signed)
Never tolerated the pulmonary vasodilators.  There was concern about cross-reactivity with other medications and/or activities. ?Remains on home oxygen for activity.  He does not usually use it when he is sitting and not doing things, with any exertion he is and also uses CPAP for sleep. ?

## 2021-09-05 NOTE — Assessment & Plan Note (Signed)
Blood pressure well controlled.  This is on only torsemide.  He is using midodrine to help maintain weight. ? ?Doing well.  Feeling well.  No changes. ?

## 2021-09-05 NOTE — Assessment & Plan Note (Signed)
Volume level seems to be stable.  He was just put on a higher dose of furosemide for little bit.  Stabling out. ? ? ? ?Did just get [facility] done from [facility], unfortunately not available. ?

## 2021-09-05 NOTE — Assessment & Plan Note (Signed)
Needs stable CPAP ?

## 2021-09-05 NOTE — Assessment & Plan Note (Signed)
Please work on weight loss. ?

## 2021-09-05 NOTE — Assessment & Plan Note (Signed)
Not really having any signs symptoms of CHF either right or left-sided.  In fact he has been hypotensive requiring midodrine to bring his pressures up.  I would be really reluctant to do much more than just diuretic. ?Plan: Continue torsemide-discussed sliding scale. ?

## 2021-10-20 ENCOUNTER — Ambulatory Visit
Admission: RE | Admit: 2021-10-20 | Discharge: 2021-10-20 | Disposition: A | Discharge status: Home / Self Care | Payer: Medicare (Managed Care) | Source: Ambulatory Visit | Attending: Vascular Surgery | Admitting: Vascular Surgery

## 2021-10-20 ENCOUNTER — Other Ambulatory Visit: Payer: Self-pay | Admitting: Vascular Surgery

## 2021-10-20 DIAGNOSIS — R0601 Orthopnea: Secondary | ICD-10-CM

## 2021-10-20 DIAGNOSIS — R0902 Hypoxemia: Secondary | ICD-10-CM

## 2021-10-20 DIAGNOSIS — R0602 Shortness of breath: Secondary | ICD-10-CM

## 2021-11-19 ENCOUNTER — Ambulatory Visit
Admission: RE | Admit: 2021-11-19 | Discharge: 2021-11-19 | Disposition: A | Discharge status: Home / Self Care | Payer: Medicare (Managed Care) | Source: Ambulatory Visit | Attending: Vascular Surgery | Admitting: Vascular Surgery

## 2021-11-19 ENCOUNTER — Other Ambulatory Visit: Payer: Self-pay | Admitting: Vascular Surgery

## 2021-11-19 DIAGNOSIS — J690 Pneumonitis due to inhalation of food and vomit: Secondary | ICD-10-CM

## 2021-12-24 ENCOUNTER — Other Ambulatory Visit: Payer: Self-pay | Admitting: Vascular Surgery

## 2021-12-24 ENCOUNTER — Ambulatory Visit
Admission: RE | Admit: 2021-12-24 | Discharge: 2021-12-24 | Disposition: A | Discharge status: Home / Self Care | Payer: Medicare (Managed Care) | Source: Ambulatory Visit | Attending: Vascular Surgery | Admitting: Vascular Surgery

## 2021-12-24 DIAGNOSIS — Z0000 Encounter for general adult medical examination without abnormal findings: Secondary | ICD-10-CM

## 2022-03-01 ENCOUNTER — Ambulatory Visit
Admission: RE | Admit: 2022-03-01 | Discharge: 2022-03-01 | Disposition: A | Discharge status: Home / Self Care | Payer: Medicare (Managed Care) | Source: Ambulatory Visit | Attending: Family Medicine | Admitting: Family Medicine

## 2022-03-01 ENCOUNTER — Other Ambulatory Visit: Payer: Self-pay | Admitting: Family Medicine

## 2022-03-01 DIAGNOSIS — R0602 Shortness of breath: Secondary | ICD-10-CM

## 2022-03-01 DIAGNOSIS — R7981 Abnormal blood-gas level: Secondary | ICD-10-CM

## 2022-03-03 ENCOUNTER — Telehealth: Payer: Self-pay | Admitting: Cardiology

## 2022-03-03 DIAGNOSIS — G4733 Obstructive sleep apnea (adult) (pediatric): Secondary | ICD-10-CM

## 2022-03-03 DIAGNOSIS — Z9981 Dependence on supplemental oxygen: Secondary | ICD-10-CM

## 2022-03-03 DIAGNOSIS — R0602 Shortness of breath: Secondary | ICD-10-CM

## 2022-03-03 NOTE — Telephone Encounter (Signed)
Tony Jones with [place] from [facility]. She wanted Dr. Ellyn Hack to know that patient's SAT had been dropping. Lab work WNL, CXR showed scarring (had PNA in September). Patient is using O2 '@2L'$ /m all day and night.

## 2022-03-03 NOTE — Telephone Encounter (Signed)
Calling to make provider aware that pt's BMP was normal, labs stable, oxygen level low. They have ordered for pt to wear oxygen continuously. Office would like to know if provider has any other recommendations. Please advise

## 2022-03-05 NOTE — Telephone Encounter (Signed)
He hasn't been seen in pulmonary office since July 2017.  Can you please put in a referral request so we can get him set up for an appointment.

## 2022-03-05 NOTE — Telephone Encounter (Signed)
Sure --   [facility]

## 2022-03-08 NOTE — Telephone Encounter (Signed)
Order placed for referral to Pulm  Will contact  [facility]

## 2022-03-12 NOTE — Telephone Encounter (Signed)
Informed [place] NP  with [facility] . Aware cardiology placed a referral. She states she will place a referral as well

## 2022-05-18 ENCOUNTER — Other Ambulatory Visit: Payer: Self-pay | Admitting: Family Medicine

## 2022-05-18 DIAGNOSIS — K219 Gastro-esophageal reflux disease without esophagitis: Secondary | ICD-10-CM

## 2022-06-03 ENCOUNTER — Institutional Professional Consult (permissible substitution): Payer: Medicare (Managed Care) | Admitting: Pulmonary Disease

## 2022-06-15 ENCOUNTER — Ambulatory Visit
Admission: RE | Admit: 2022-06-15 | Discharge: 2022-06-15 | Disposition: A | Discharge status: Home / Self Care | Payer: Medicare (Managed Care) | Source: Ambulatory Visit | Attending: Family Medicine | Admitting: Family Medicine

## 2022-06-15 DIAGNOSIS — K219 Gastro-esophageal reflux disease without esophagitis: Secondary | ICD-10-CM

## 2022-06-15 MED ORDER — IOPAMIDOL (ISOVUE-300) INJECTION 61%
80.0000 mL | Freq: Once | INTRAVENOUS | Status: AC | PRN
  Administered 2022-06-15: 80 mL via INTRAVENOUS

## 2022-06-29 ENCOUNTER — Institutional Professional Consult (permissible substitution): Payer: Medicare (Managed Care) | Admitting: Pulmonary Disease

## 2022-07-13 ENCOUNTER — Other Ambulatory Visit: Payer: Self-pay

## 2022-07-13 ENCOUNTER — Emergency Department (HOSPITAL_COMMUNITY)
Admission: EM | Admit: 2022-07-13 | Discharge: 2022-07-13 | Disposition: A | Discharge status: Home / Self Care | Payer: Medicare (Managed Care) | Attending: Emergency Medicine | Admitting: Emergency Medicine

## 2022-07-13 ENCOUNTER — Emergency Department (HOSPITAL_COMMUNITY): Payer: Medicare (Managed Care)

## 2022-07-13 ENCOUNTER — Encounter (HOSPITAL_COMMUNITY): Payer: Self-pay

## 2022-07-13 DIAGNOSIS — Z7984 Long term (current) use of oral hypoglycemic drugs: Secondary | ICD-10-CM | POA: Insufficient documentation

## 2022-07-13 DIAGNOSIS — E1122 Type 2 diabetes mellitus with diabetic chronic kidney disease: Secondary | ICD-10-CM | POA: Diagnosis not present

## 2022-07-13 DIAGNOSIS — R0602 Shortness of breath: Secondary | ICD-10-CM | POA: Diagnosis present

## 2022-07-13 DIAGNOSIS — I129 Hypertensive chronic kidney disease with stage 1 through stage 4 chronic kidney disease, or unspecified chronic kidney disease: Secondary | ICD-10-CM | POA: Insufficient documentation

## 2022-07-13 DIAGNOSIS — Z79899 Other long term (current) drug therapy: Secondary | ICD-10-CM | POA: Diagnosis not present

## 2022-07-13 DIAGNOSIS — N183 Chronic kidney disease, stage 3 unspecified: Secondary | ICD-10-CM | POA: Insufficient documentation

## 2022-07-13 DIAGNOSIS — Z1152 Encounter for screening for COVID-19: Secondary | ICD-10-CM | POA: Insufficient documentation

## 2022-07-13 DIAGNOSIS — I272 Pulmonary hypertension, unspecified: Secondary | ICD-10-CM | POA: Insufficient documentation

## 2022-07-13 DIAGNOSIS — R0902 Hypoxemia: Secondary | ICD-10-CM | POA: Diagnosis not present

## 2022-07-13 LAB — CBC WITH DIFFERENTIAL/PLATELET
Abs Immature Granulocytes: 0.05 10*3/uL (ref 0.00–0.07)
Basophils Absolute: 0 10*3/uL (ref 0.0–0.1)
Basophils Relative: 0 %
Eosinophils Absolute: 0.2 10*3/uL (ref 0.0–0.5)
Eosinophils Relative: 2 %
HCT: 45.4 % (ref 39.0–52.0)
Hemoglobin: 15.7 g/dL (ref 13.0–17.0)
Immature Granulocytes: 1 %
Lymphocytes Relative: 6 %
Lymphs Abs: 0.5 10*3/uL — ABNORMAL LOW (ref 0.7–4.0)
MCH: 34.3 pg — ABNORMAL HIGH (ref 26.0–34.0)
MCHC: 34.6 g/dL (ref 30.0–36.0)
MCV: 99.1 fL (ref 80.0–100.0)
Monocytes Absolute: 0.3 10*3/uL (ref 0.1–1.0)
Monocytes Relative: 3 %
Neutro Abs: 8 10*3/uL — ABNORMAL HIGH (ref 1.7–7.7)
Neutrophils Relative %: 88 %
Platelets: 136 10*3/uL — ABNORMAL LOW (ref 150–400)
RBC: 4.58 MIL/uL (ref 4.22–5.81)
RDW: 15.7 % — ABNORMAL HIGH (ref 11.5–15.5)
WBC: 9.1 10*3/uL (ref 4.0–10.5)
nRBC: 0 % (ref 0.0–0.2)

## 2022-07-13 LAB — COMPREHENSIVE METABOLIC PANEL
ALT: 18 U/L (ref 0–44)
AST: 24 U/L (ref 15–41)
Albumin: 3.1 g/dL — ABNORMAL LOW (ref 3.5–5.0)
Alkaline Phosphatase: 100 U/L (ref 38–126)
Anion gap: 14 (ref 5–15)
BUN: 23 mg/dL — ABNORMAL HIGH (ref 6–20)
CO2: 30 mmol/L (ref 22–32)
Calcium: 8.9 mg/dL (ref 8.9–10.3)
Chloride: 95 mmol/L — ABNORMAL LOW (ref 98–111)
Creatinine, Ser: 1.61 mg/dL — ABNORMAL HIGH (ref 0.61–1.24)
GFR, Estimated: 49 mL/min — ABNORMAL LOW (ref >60)
Glucose, Bld: 242 mg/dL — ABNORMAL HIGH (ref 70–99)
Potassium: 4.4 mmol/L (ref 3.5–5.1)
Sodium: 139 mmol/L (ref 135–145)
Total Bilirubin: 1.1 mg/dL (ref 0.3–1.2)
Total Protein: 6.8 g/dL (ref 6.5–8.1)

## 2022-07-13 LAB — BRAIN NATRIURETIC PEPTIDE: B Natriuretic Peptide: 56.4 pg/mL (ref 0.0–100.0)

## 2022-07-13 LAB — RESP PANEL BY RT-PCR (RSV, FLU A&B, COVID)  RVPGX2
Influenza A by PCR: NEGATIVE
Influenza B by PCR: NEGATIVE
Resp Syncytial Virus by PCR: NEGATIVE
SARS Coronavirus 2 by RT PCR: NEGATIVE

## 2022-07-13 NOTE — ED Provider Notes (Signed)
[facility] Provider Note   CSN: [id number] Arrival date & time: 07/13/22  [id number]     History {Add pertinent medical, surgical, social history, OB history to HPI:1} Chief Complaint  Patient presents with   Shortness of Breath    Tony Jones is a 62 y.o. male.   Shortness of Breath Patient comes with shortness of breath.  History of pulmonary hypertension.  On chronic oxygen.  Reportedly had sats down to the 40s at [facility].  Due to baseline mental disorder cannot provide too much history.  States he feels fine if he is outside.  Reportedly family stated that normal sats for him are about 85%.    Past Medical History:  Diagnosis Date   Anemia    Anxiety    Barrett esophagus    Cholelithiasis    Chronic respiratory failure with hypoxia (HCC)    CKD (chronic kidney disease) stage 3, GFR 30-59 ml/min (HCC)    Developmental delay    Diabetes mellitus type 2, uncontrolled    Dyslipidemia    Gallstones 01/2019   GERD (gastroesophageal reflux disease)    Hypertension    Mental disorder    Chronic developmental delay.; still lives with mother as primary care giver; spends 2-3 days in adult day-care.   Nephrolithiasis    Pulmonary hypertension ([place])    Likely related to long-standing hypoxia and possible OSA/OHS (group 3) & ~DHF (Group 2); -- No longer on Pulmnoary Vasodilator (family request)   Right heart failure ([place])    Recovered as on July 2020    Home Medications Prior to Admission medications   Medication Sig Start Date End Date Taking? Authorizing Provider  acetaminophen (TYLENOL) 650 MG CR tablet Take 1,300 mg by mouth daily as needed for pain.    [provider]  allopurinol (ZYLOPRIM) 300 MG tablet Take 300 mg by mouth daily.  01/18/15   [provider]  atorvastatin (LIPITOR) 40 MG tablet Take 1 tablet (40 mg total) by mouth daily at 6 PM. 10/13/17   Tillery, Satira Mccallum, PA-C  citalopram (CELEXA) 20 MG  tablet Take 20 mg by mouth daily.    [provider]  docusate sodium (COLACE) 100 MG capsule Take 100 mg by mouth daily as needed for mild constipation.     [provider]  fluticasone (FLONASE) 50 MCG/ACT nasal spray Place 1 spray into both nostrils daily.    [provider]  levocetirizine (XYZAL) 5 MG tablet Take 2.5 mg by mouth every other day.     [provider]  LORazepam (ATIVAN) 0.5 MG tablet Take 0.5 mg by mouth 2 (two) times daily as needed for anxiety.    [provider]  LORazepam (ATIVAN) 0.5 MG tablet Take 1 tablet by mouth 2 (two) times daily as needed. 02/21/17   [provider]  metFORMIN (GLUCOPHAGE) 500 MG tablet Take 500 mg by mouth daily with breakfast.    [provider]  metFORMIN (GLUCOPHAGE) 500 MG tablet 1 tablet with a meal    [provider]  midodrine (PROAMATINE) 5 MG tablet Take 1 tablet (5 mg total) by mouth See admin instructions. Take 1 tablet (5 mg) by mouth 3 times daily, except take an additional tablet (5 mg) by mouth at midday on center days to maintain adequate blood pressure. 08/27/19   Leonie Man, MD  Multiple Vitamin (MULTIVITAMIN WITH MINERALS) TABS tablet Take 1 tablet by mouth daily with breakfast.  [provider]  pantoprazole (PROTONIX) 40 MG tablet Take 40 mg by mouth 2 (two) times daily with a meal.     [provider]  pantoprazole (PROTONIX) 40 MG tablet 1 tablet    [provider]  potassium chloride SA (K-DUR,KLOR-CON) 20 MEQ tablet Take 1 tablet (20 mEq total) by mouth daily. 10/14/17   Shirley Friar, PA-C  Propylene Glycol (SYSTANE BALANCE OP) Place 1 drop into both eyes 2 (two) times daily as needed (dry eyes).    [provider]  senna-docusate (SENOKOT-S) 8.6-50 MG tablet Take 2 tablets by mouth at bedtime.    [provider]  torsemide (DEMADEX) 20 MG tablet See admin instructions.    [provider]  triamcinolone cream (KENALOG) 0.1 % Apply 1 application topically See admin instructions. Apply to arms daily    [provider]      Allergies    Codeine    Review of Systems   Review of Systems  Respiratory:  Positive for shortness of breath.     Physical Exam Updated Vital Signs BP (!) 121/92   Pulse (!) 103   Temp 97.7 F (36.5 C) (Oral)   Resp (!) 22   SpO2 91%  Physical Exam Vitals reviewed.  HENT:     Head: Atraumatic.  Cardiovascular:     Rate and Rhythm: Normal rate and regular rhythm.  Pulmonary:     Breath sounds: No wheezing or rhonchi.  Chest:     Chest wall: Tenderness present.  Abdominal:     Tenderness: There is no abdominal tenderness.  Neurological:     Mental Status: He is alert.     Comments: At reported baseline.     ED Results / Procedures / Treatments   Labs (all labs ordered are listed, but only abnormal results are displayed) Labs Reviewed  RESP PANEL BY RT-PCR (RSV, FLU A&B, COVID)  [id number]  COMPREHENSIVE METABOLIC PANEL  BRAIN NATRIURETIC PEPTIDE  CBC WITH DIFFERENTIAL/PLATELET    EKG None  Radiology No results found.  Procedures Procedures  {Document cardiac monitor, telemetry assessment procedure when appropriate:1}  Medications Ordered in ED Medications - No data to display  ED Course/ Medical Decision Making/ A&P   {   Click here for ABCD2, HEART and other calculatorsREFRESH Note before signing :1}                          Medical Decision Making Amount and/or Complexity of Data Reviewed Labs: ordered. Radiology: ordered.  Patient with shortness of breath.  Hypoxia.  Sats reportedly go down to to the 40s.  Patient without complaints but reviewed previous cardiology note.  History of pulmonary hypertension and has been unable to tolerate some of the medicines.  Will get x-ray and basic blood work.  BNP.  Will also check EKG.  BNP is normal.  Blood work overall reassuring.  CBC shows no anemia and white  count is not elevated.  Creatinine is 1.6 which appears to be near   Or improved from baseline.   Chest x-ray independently interpreted and shows cardiomegaly and {Document critical care time when appropriate:1} {Document review of labs and clinical decision tools ie heart score, Chads2Vasc2 etc:1}  {Document your independent review of radiology images, and any outside records:1} {Document your discussion with family members, caretakers, and with consultants:1} {Document social determinants of health affecting pt's care:1} {Document your decision making why or why not admission, treatments were needed:1}  Final Clinical Impression(s) / ED Diagnoses Final diagnoses:  None    Rx / DC Orders ED Discharge Orders     None

## 2022-07-13 NOTE — ED Notes (Signed)
[facility] called to offer a med list or any other way they could help facilitate care; contact number [phone].

## 2022-07-13 NOTE — ED Notes (Signed)
Pt given diet gingerale and [place] sandwich, okay'd per MD Alvino Chapel

## 2022-07-13 NOTE — ED Triage Notes (Signed)
Pt brought in by EMS after being at [facility] and pt became hypoxic, sats in the 40's. Pt wears O2 at home, 4L, and per pt's family his normal saturation is around 85%.

## 2022-07-15 ENCOUNTER — Ambulatory Visit (INDEPENDENT_AMBULATORY_CARE_PROVIDER_SITE_OTHER): Payer: Medicare (Managed Care) | Admitting: Pulmonary Disease

## 2022-07-15 ENCOUNTER — Encounter: Payer: Self-pay | Admitting: Pulmonary Disease

## 2022-07-15 VITALS — BP 112/62 | HR 85 | Ht 61.0 in | Wt 180.0 lb

## 2022-07-15 DIAGNOSIS — I5032 Chronic diastolic (congestive) heart failure: Secondary | ICD-10-CM | POA: Diagnosis not present

## 2022-07-15 DIAGNOSIS — I272 Pulmonary hypertension, unspecified: Secondary | ICD-10-CM

## 2022-07-15 NOTE — Patient Instructions (Signed)
Oxygen supplementation at 4 L Check oxygen periodically to make sure he is keeping his saturations above 88%  Graded exercises as tolerated, regular walks should help  Check regular weights  -Weight gain of a few pounds may mean is keeping more fluid on board, will benefit from water pills  I will see Korrie back in about 3 months

## 2022-07-15 NOTE — Progress Notes (Signed)
Tony Jones    [id number]    May 18, 1960  Primary Care Physician:[facility], [place]  Referring Physician: Canon, [address] [place],  [place] [address]  Chief complaint:   Patient with pulmonary hypertension, chronic hypoxemia Accompanied by his mom  HPI:  Patient with a history of muscular dystrophy, accompanied by his mom  Chronic hypoxemia, developmental delay Has had chronic hypoxemic respiratory failure for which has been on oxygen supplementation Has severe pulmonary hypertension, was unable to tolerate vasodilator therapy Requires midodrine to maintain his blood pressure  History of heart failure  Follows up with cardiology  Never smoker  Limited activities Usually not interested in participating in exercises  Consent had been raised about sleep disordered breathing, his mom does not think he will tolerate a CPAP  Outpatient Encounter Medications as of 07/15/2022  Medication Sig   acetaminophen (TYLENOL) 650 MG CR tablet Take 1,300 mg by mouth daily as needed for pain.   allopurinol (ZYLOPRIM) 300 MG tablet Take 300 mg by mouth daily.    atorvastatin (LIPITOR) 40 MG tablet Take 1 tablet (40 mg total) by mouth daily at 6 PM.   citalopram (CELEXA) 20 MG tablet Take 20 mg by mouth daily.   docusate sodium (COLACE) 100 MG capsule Take 100 mg by mouth daily as needed for mild constipation.    fluticasone (FLONASE) 50 MCG/ACT nasal spray Place 1 spray into both nostrils daily.   levocetirizine (XYZAL) 5 MG tablet Take 2.5 mg by mouth every other day.    LORazepam (ATIVAN) 0.5 MG tablet Take 1 tablet by mouth 2 (two) times daily as needed.   midodrine (PROAMATINE) 5 MG tablet Take 1 tablet (5 mg total) by mouth See admin instructions. Take 1 tablet (5 mg) by mouth 3 times daily, except take an additional tablet (5 mg) by mouth at midday on center days to maintain adequate blood pressure.    Multiple Vitamin (MULTIVITAMIN WITH MINERALS) TABS tablet Take 1 tablet by mouth daily with breakfast.    pantoprazole (PROTONIX) 40 MG tablet Take 40 mg by mouth 2 (two) times daily with a meal.    potassium chloride SA (K-DUR,KLOR-CON) 20 MEQ tablet Take 1 tablet (20 mEq total) by mouth daily.   Propylene Glycol (SYSTANE BALANCE OP) Place 1 drop into both eyes 2 (two) times daily as needed (dry eyes).   senna-docusate (SENOKOT-S) 8.6-50 MG tablet Take 2 tablets by mouth at bedtime.   torsemide (DEMADEX) 20 MG tablet See admin instructions.   LORazepam (ATIVAN) 0.5 MG tablet Take 0.5 mg by mouth 2 (two) times daily as needed for anxiety. (Patient not taking: Reported on 07/15/2022)   metFORMIN (GLUCOPHAGE) 500 MG tablet Take 500 mg by mouth daily with breakfast. (Patient not taking: Reported on 07/15/2022)   metFORMIN (GLUCOPHAGE) 500 MG tablet 1 tablet with a meal (Patient not taking: Reported on 07/15/2022)   pantoprazole (PROTONIX) 40 MG tablet 1 tablet (Patient not taking: Reported on 07/15/2022)   triamcinolone cream (KENALOG) 0.1 % Apply 1 application topically See admin instructions. Apply to arms daily (Patient not taking: Reported on 07/15/2022)   No facility-administered encounter medications on file as of 07/15/2022.    Allergies as of 07/15/2022 - Review Complete 07/15/2022  Allergen Reaction Noted   Codeine Nausea And Vomiting 09/09/2015    Past Medical History:  Diagnosis Date   Anemia    Anxiety  Barrett esophagus    Cholelithiasis    Chronic respiratory failure with hypoxia (HCC)    CKD (chronic kidney disease) stage 3, GFR 30-59 ml/min (HCC)    Developmental delay    Diabetes mellitus type 2, uncontrolled    Dyslipidemia    Gallstones 01/2019   GERD (gastroesophageal reflux disease)    Hypertension    Mental disorder    Chronic developmental delay.; still lives with mother as primary care giver; spends 2-3 days in adult day-care.   Nephrolithiasis    Pulmonary  hypertension ([facility])    Likely related to long-standing hypoxia and possible OSA/OHS (group 3) & ~DHF (Group 2); -- No longer on Pulmnoary Vasodilator (family request)   Right heart failure [facility])    Recovered as on July 2020    Past Surgical History:  Procedure Laterality Date   CHOLECYSTECTOMY N/A 01/24/2019   Procedure: LAPAROSCOPIC CHOLECYSTECTOMY WITH INTRAOPERATIVE CHOLANGIOGRAM;  Surgeon: Donnie Mesa, MD;  Location: [place];  Service: General;  Laterality: N/A;   CYSTOSCOPY/URETEROSCOPY/HOLMIUM LASER/STENT PLACEMENT Right 11/14/2017   Procedure: CYSTOSCOPY/URETEROSCOPY/RETROGRADE/STENT PLACEMENT;  Surgeon: [name], MD;  Location: [facility];  Service: Urology;  Laterality: Right;  75 MINS  SPINAL OR MAC ANESTHESIA  ALSO EXTRACTION OF STONE   ESOPHAGOGASTRODUODENOSCOPY N/A 08/02/2017   Procedure: ESOPHAGOGASTRODUODENOSCOPY (EGD);  Surgeon: [name], MD;  Location: [facility];  Service: Endoscopy;  Laterality: N/A;   GIVENS CAPSULE STUDY N/A 10/12/2017   Procedure: GIVENS CAPSULE STUDY;  Surgeon: [name], MD;  Location: [facility];  Service: Endoscopy;  Laterality: N/A;   RIGHT/LEFT HEART CATH AND CORONARY ANGIOGRAPHY N/A 10/05/2017   Procedure: RIGHT/LEFT HEART CATH AND CORONARY ANGIOGRAPHY;  Surgeon: [name], MD =  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 (R>L). RAP 16 mmHg, RVP 92/16 mmHg; PAP 93/51 mmHg mean 67, PCWP mn27 mmHg. LVP 110/21 - EDP 21 mmHg. AoP 115/75 mmHg.  PaO2 56%, AoO2 99%. CO/I (Fick) 4.95-2.65, PVR 8 WU, PAPi 2.65 (PVR 8 WU)   THROAT SURGERY     TRANSTHORACIC ECHOCARDIOGRAM  01/2015   Mild concentric LVH.  EF 60 to 65%.  GR 1 DD, but high filling pressures.  IVS had diastolic and systolic flattening consistent with volume and pressure overload.  PA pressures estimated 75 mmHg.   TRANSTHORACIC ECHOCARDIOGRAM  11/2018   Normal LV size and function.  EF 55 to 60%.  Moderate basal septal hypertrophy.  GR 1 DD.  No R WMA.   Normal RV size with preserved/normal function.  Moderate aortic sclerosis but no stenosis.  Mild aortic root dilation at 38 mm.  Lipomatous IAS    Family History  Problem Relation Age of Onset   Congestive Heart Failure Father    Heart disease Father    Heart attack Father    Atrial fibrillation Father    Brain cancer Father    Melanoma Father    Diabetes Mother    Stroke Mother    Hypertension Mother    Fainting Paternal Grandfather    Congestive Heart Failure Paternal Grandfather    Atrial fibrillation Paternal Grandfather     Social History   Socioeconomic History   Marital status: Single    Spouse name: Not on file   Number of children: Not on file   Years of education: Not on file   Highest education level: Not on file  Occupational History   Occupation: unemployed  Tobacco Use   Smoking status: Never   Smokeless tobacco: Never  Vaping Use   Vaping Use: Never used  Substance and Sexual Activity   Alcohol use: No   Drug use: No   Sexual activity: Never  Other Topics Concern   Not on file  Social History Narrative   Not on file   Social Determinants of Health   Financial Resource Strain: Not on file  Food Insecurity: Not on file  Transportation Needs: Not on file  Physical Activity: Not on file  Stress: Not on file  Social Connections: Not on file  Intimate Partner Violence: Not on file    Review of Systems  Constitutional:  Positive for fatigue.  Respiratory:  Positive for shortness of breath.   Psychiatric/Behavioral:  Positive for sleep disturbance.     Vitals:   07/15/22 1431  BP: 112/62  Pulse: 85  SpO2: 95%     Physical Exam Constitutional:      Appearance: He is obese.  HENT:     Head: Normocephalic.     Mouth/Throat:     Mouth: Mucous membranes are moist.  Eyes:     General: No scleral icterus.    Pupils: Pupils are equal, round, and reactive to light.  Cardiovascular:     Rate and Rhythm: Normal rate and regular rhythm.      Heart sounds: No murmur heard.    No friction rub.  Pulmonary:     Effort: No respiratory distress.     Breath sounds: No stridor. No wheezing or rhonchi.  Musculoskeletal:     Cervical back: No rigidity or tenderness.  Neurological:     Mental Status: He is alert.  Psychiatric:        Mood and Affect: Mood normal.    Data Reviewed: Echocardiogram 11/22/2020 reviewed  Records from cardiology follow-ups reviewed-recently followed up with Dr. Ellyn Hack  CT scan from June 2022 reviewed  Assessment:  Severe pulmonary hypertension -Unable to tolerate vasodilator therapy  Chronic respiratory failure  History of congestive heart failure  History of muscular dystrophy  Deconditioning  Moderate probability of significant sleep disordered breathing and obesity hypoventilation  Plan/Recommendations: Continue oxygen supplementation  Graded exercise as tolerated  Encouraged to check oxygen level periodically and maintain saturations above 90%, currently on 4 L of oxygen  Encouraged to monitor weight on a regular basis to assess need for increased diuresis   Sherrilyn Rist MD [facility] 07/15/2022, 3:04 PM  CC: [employer], [employer]*

## 2022-08-03 ENCOUNTER — Ambulatory Visit: Payer: Medicare (Managed Care) | Admitting: General Practice

## 2022-08-10 ENCOUNTER — Telehealth: Payer: Self-pay | Admitting: Pulmonary Disease

## 2022-08-10 NOTE — Telephone Encounter (Signed)
Max checking on fax sent 08/03/2022 for portable oxygen concentrator. Max phone number is [phone].

## 2022-08-12 NOTE — Telephone Encounter (Signed)
Tony Jones has form- confirmed via secure chat   Called Max and left detailed msg letting him know we will fill out form and fax   Closing enounter

## 2022-08-18 NOTE — Progress Notes (Unsigned)
Cardiology Clinic Note   Patient Name: Tony Jones Date of Encounter: 08/19/2022  Primary Care Provider:  [facility] Primary Cardiologist:  Bryan Lemma, MD  Patient Profile    Tony Jones 62 year old male presents the clinic today for follow-up evaluation of his chronic diastolic CHF and hypertension.  Past Medical History    Past Medical History:  Diagnosis Date   Anemia    Anxiety    Barrett esophagus    Cholelithiasis    Chronic respiratory failure with hypoxia    CKD (chronic kidney disease) stage 3, GFR 30-59 ml/min    Developmental delay    Diabetes mellitus type 2, uncontrolled    Dyslipidemia    Gallstones 01/2019   GERD (gastroesophageal reflux disease)    Hypertension    Mental disorder    Chronic developmental delay.; still lives with mother as primary care giver; spends 2-3 days in adult day-care.   Nephrolithiasis    Pulmonary hypertension    Likely related to long-standing hypoxia and possible OSA/OHS (group 3) & ~DHF (Group 2); -- No longer on Pulmnoary Vasodilator (family request)   Right heart failure    Recovered as on July 2020   Past Surgical History:  Procedure Laterality Date   CHOLECYSTECTOMY N/A 01/24/2019   Procedure: LAPAROSCOPIC CHOLECYSTECTOMY WITH INTRAOPERATIVE CHOLANGIOGRAM;  Surgeon: Manus Rudd, MD;  Location: [facility] OR;  Service: General;  Laterality: N/A;   CYSTOSCOPY/URETEROSCOPY/HOLMIUM LASER/STENT PLACEMENT Right 11/14/2017   Procedure: CYSTOSCOPY/URETEROSCOPY/RETROGRADE/STENT PLACEMENT;  Surgeon: Marcine Matar, MD;  Location: [facility] ORS;  Service: Urology;  Laterality: Right;  75 MINS  SPINAL OR MAC ANESTHESIA  ALSO EXTRACTION OF STONE   ESOPHAGOGASTRODUODENOSCOPY N/A 08/02/2017   Procedure: ESOPHAGOGASTRODUODENOSCOPY (EGD);  Surgeon: Vida Rigger, MD;  Location: [facility] ENDOSCOPY;  Service: Endoscopy;  Laterality: N/A;   GIVENS CAPSULE STUDY N/A 10/12/2017   Procedure: GIVENS CAPSULE STUDY;   Surgeon: Vida Rigger, MD;  Location: [facility] ENDOSCOPY;  Service: Endoscopy;  Laterality: N/A;   RIGHT/LEFT HEART CATH AND CORONARY ANGIOGRAPHY N/A 10/05/2017   Procedure: RIGHT/LEFT HEART CATH AND CORONARY ANGIOGRAPHY;  Surgeon: Laurey Morale, MD =  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 (R>L). RAP 16 mmHg, RVP 92/16 mmHg; PAP 93/51 mmHg mean 67, PCWP mn27 mmHg. LVP 110/21 - EDP 21 mmHg. AoP 115/75 mmHg.  PaO2 56%, AoO2 99%. CO/I (Fick) 4.95-2.65, PVR 8 WU, PAPi 2.65 (PVR 8 WU)   THROAT SURGERY     TRANSTHORACIC ECHOCARDIOGRAM  01/2015   Mild concentric LVH.  EF 60 to 65%.  GR 1 DD, but high filling pressures.  IVS had diastolic and systolic flattening consistent with volume and pressure overload.  PA pressures estimated 75 mmHg.   TRANSTHORACIC ECHOCARDIOGRAM  11/2018   Normal LV size and function.  EF 55 to 60%.  Moderate basal septal hypertrophy.  GR 1 DD.  No R WMA.  Normal RV size with preserved/normal function.  Moderate aortic sclerosis but no stenosis.  Mild aortic root dilation at 38 mm.  Lipomatous IAS    Allergies  Allergies  Allergen Reactions   Codeine Nausea And Vomiting    History of Present Illness    Tony Jones has a PMH of chronic diastolic CHF, pulmonary hypertension, right heart failure, HTN, OSA, hematic emesis, chronic cholecystitis, type 2 diabetes, renal insufficiency, thrombocytopenia, moderate obesity, HLD, and anemia.  He was seen in follow-up by Dr. Herbie Baltimore on 07/31/2021.  His home oxygen was continued  for pulmonary hypertension as well as his CPAP.  He was tolerating his torsemide well.  His blood pressure was well-controlled.  His fluid volume status was stable.  He was having no symptoms related to his heart right heart failure.  It was recommended that he work on weight loss.  He presented with his mother.  She reported trying to keep him active and doing exercise.  Follow-up was planned for 1 year.  He was recently in the  emergency department 07/13/2022 for shortness of breath.  His respiratory virus panel was negative.  His chest x-ray showed similar by basilar opacities favoring scarring.  His CMP showed elevated glucose, stable creatinine and electrolyte values.  His BNP was normal.  CBC showed no anemia and normal white count.  His oxygen was maintained 4 L.  Oxygen saturation at home typically runs in the 90 percentile.  He presents to the clinic today for follow-up evaluation and states he feels okay.  He presents with his mom.  We reviewed his recent emergency department visit.  They expressed understanding.  He is somewhat physically active doing exercises at pace and jobs around the house.  I reviewed the importance of heart healthy low-sodium diet.  He is no longer using CPAP at night but uses nasal cannula oxygen.  His oxygen saturation today is 96% on room air.  His blood pressure is well-controlled.  Will plan follow-up in 12 months.  Today he denies chest pain, shortness of breath, lower extremity edema, fatigue, palpitations, melena, hematuria, hemoptysis, diaphoresis, weakness, presyncope, syncope, orthopnea, and PND.    Home Medications    Prior to Admission medications   Medication Sig Start Date End Date Taking? Authorizing Provider  acetaminophen (TYLENOL) 650 MG CR tablet Take 1,300 mg by mouth daily as needed for pain.    [provider]  allopurinol (ZYLOPRIM) 300 MG tablet Take 300 mg by mouth daily.  01/18/15   [provider]  atorvastatin (LIPITOR) 40 MG tablet Take 1 tablet (40 mg total) by mouth daily at 6 PM. 10/13/17   Tillery, Mariam Dollar, PA-C  citalopram (CELEXA) 20 MG tablet Take 20 mg by mouth daily.    [provider]  docusate sodium (COLACE) 100 MG capsule Take 100 mg by mouth daily as needed for mild constipation.     [provider]  fluticasone (FLONASE) 50 MCG/ACT nasal spray Place 1 spray into both nostrils daily.    [provider]  levocetirizine (XYZAL) 5 MG tablet Take 2.5 mg by mouth every other day.     [provider]  LORazepam (ATIVAN) 0.5 MG tablet Take 0.5 mg by mouth 2 (two) times daily as needed for anxiety. Patient not taking: Reported on 07/15/2022    [provider]  LORazepam (ATIVAN) 0.5 MG tablet Take 1 tablet by mouth 2 (two) times daily as needed. 02/21/17   [provider]  metFORMIN (GLUCOPHAGE) 500 MG tablet Take 500 mg by mouth daily with breakfast. Patient not taking: Reported on 07/15/2022    [provider]  metFORMIN (GLUCOPHAGE) 500 MG tablet 1 tablet with a meal Patient not taking: Reported on 07/15/2022    [provider]  midodrine (PROAMATINE) 5 MG tablet Take 1 tablet (5 mg total) by mouth See admin instructions. Take 1 tablet (5 mg) by mouth 3 times daily, except take an additional tablet (5 mg) by mouth at midday on center days to maintain adequate blood pressure. 08/27/19   Marykay Lex, MD  Multiple Vitamin (MULTIVITAMIN WITH MINERALS) TABS tablet Take 1 tablet by mouth daily with breakfast.     [provider]  pantoprazole (PROTONIX) 40 MG tablet Take 40 mg by mouth 2 (two) times daily with a meal.     [provider]  pantoprazole (PROTONIX) 40 MG tablet 1 tablet Patient not taking: Reported on 07/15/2022    [provider]  potassium chloride SA (K-DUR,KLOR-CON) 20 MEQ tablet Take 1 tablet (20 mEq total) by mouth daily. 10/14/17   Graciella Freer, PA-C  Propylene Glycol (SYSTANE BALANCE OP) Place 1 drop into both eyes 2 (two) times daily as needed (dry eyes).    [provider]  senna-docusate (SENOKOT-S) 8.6-50 MG tablet Take 2 tablets by mouth at bedtime.    [provider]  torsemide (DEMADEX) 20 MG tablet See admin instructions.    [provider]  triamcinolone cream (KENALOG) 0.1 % Apply 1 application topically See admin instructions. Apply to arms daily Patient not  taking: Reported on 07/15/2022    [provider]    Family History    Family History  Problem Relation Age of Onset   Congestive Heart Failure Father    Heart disease Father    Heart attack Father    Atrial fibrillation Father    Brain cancer Father    Melanoma Father    Diabetes Mother    Stroke Mother    Hypertension Mother    Fainting Paternal Grandfather    Congestive Heart Failure Paternal Grandfather    Atrial fibrillation Paternal Grandfather    He indicated that his mother is alive. He indicated that his father is deceased. He indicated that both of his brothers are alive. He indicated that his maternal grandmother is deceased. He indicated that his maternal grandfather is deceased. He indicated that his paternal grandmother is deceased. He indicated that his paternal grandfather is deceased.  Social History    Social History   Socioeconomic History   Marital status: Single    Spouse name: Not on file   Number of children: Not on file   Years of education: Not on file   Highest education level: Not on file  Occupational History   Occupation: unemployed  Tobacco Use   Smoking status: Never   Smokeless tobacco: Never  Vaping Use   Vaping Use: Never used  Substance and Sexual Activity   Alcohol use: No   Drug use: No   Sexual activity: Never  Other Topics Concern   Not on file  Social History Narrative   Not on file   Social Determinants of Health   Financial Resource Strain: Not on file  Food Insecurity: Not on file  Transportation Needs: Not on file  Physical Activity: Not on file  Stress: Not on file  Social Connections: Not on file  Intimate Partner Violence: Not on file     Review of Systems    General:  No chills, fever, night sweats or weight changes.  Cardiovascular:  No chest pain, dyspnea on exertion, edema, orthopnea, palpitations, paroxysmal nocturnal dyspnea. Dermatological: No rash, lesions/masses Respiratory: No cough,  dyspnea Urologic: No hematuria, dysuria Abdominal:   No nausea, vomiting, diarrhea, bright red blood per rectum, melena, or hematemesis Neurologic:  No visual changes, wkns, changes in mental status. All other systems reviewed and are otherwise negative except as noted above.  Physical Exam    VS:  BP 110/70   Pulse 76   Ht  (1.549 m)  Wt 179 lb (81.2 kg)   SpO2 96%   BMI 33.82 kg/m  , BMI Body mass index is 33.82 kg/m. GEN: Well nourished, well developed, in no acute distress. HEENT: normal. Neck: Supple, no JVD, carotid bruits, or masses. Cardiac: RRR, no murmurs, rubs, or gallops. No clubbing, cyanosis, edema.  Radials/DP/PT 2+ and equal bilaterally.  Respiratory:  Respirations regular and unlabored, clear to auscultation bilaterally. GI: Soft, nontender, nondistended, BS + x 4. MS: no deformity or atrophy. Skin: warm and dry, no rash. Neuro:  Strength and sensation are intact. Psych: Normal affect.  Accessory Clinical Findings    Recent Labs: 07/13/2022: ALT 18; B Natriuretic Peptide 56.4; BUN 23; Creatinine, Ser 1.61; Hemoglobin 15.7; Platelets 136; Potassium 4.4; Sodium 139   Recent Lipid Panel    Component Value Date/Time   CHOL 175 07/04/2018 0910   TRIG 324 (H) 07/04/2018 0910   HDL 31 (L) 07/04/2018 0910   CHOLHDL 5.6 07/04/2018 0910   VLDL 65 (H) 07/04/2018 0910   LDLCALC 79 07/04/2018 0910         ECG personally reviewed by me today-none today.  Echocardiogram 11/23/2018  IMPRESSIONS     1. The left ventricle has normal systolic function, with an ejection  fraction of 55-60%. The cavity size was normal. moderate basal septal  hypertrophy. Left ventricular diastolic Doppler parameters are consistent  with impaired relaxation. No evidence of   left ventricular regional wall motion abnormalities.   2. The right ventricle has normal systolic function. The cavity was  normal. There is no increase in right ventricular wall thickness. Right   ventricular systolic pressure could not be assessed.   3. The aortic valve is tricuspid. Moderate sclerosis of the aortic valve.   4. The aorta is abnormal in size and structure.   5. There is mild dilatation of the aortic root measuring 38 mm.   6. The interatrial septum appears to be lipomatous.   FINDINGS   Left Ventricle: The left ventricle has normal systolic function, with an  ejection fraction of 55-60%. The cavity size was normal. moderate basal  septal hypertrophy. Left ventricular diastolic Doppler parameters are  consistent with impaired relaxation.  Normal left ventricular filling pressures No evidence of left ventricular  regional wall motion abnormalities..   Right Ventricle: The right ventricle has normal systolic function. The  cavity was normal. There is no increase in right ventricular wall  thickness. Right ventricular systolic pressure could not be assessed.   Left Atrium: Left atrial size was normal in size.   Right Atrium: Right atrial size was normal in size. Right atrial pressure  is estimated at 3 mmHg.   Interatrial Septum: No atrial level shunt detected by color flow Doppler.  Increased thickness of the atrial septum sparing the fossa ovalis  consistent with The interatrial septum appears to be lipomatous.   Pericardium: There is no evidence of pericardial effusion.   Mitral Valve: The mitral valve is normal in structure. Mitral valve  regurgitation is not visualized by color flow Doppler.   Tricuspid Valve: The tricuspid valve is normal in structure. Tricuspid  valve regurgitation was not visualized by color flow Doppler.   Aortic Valve: The aortic valve is tricuspid Moderate sclerosis of the  aortic valve. Aortic valve regurgitation was not visualized by color flow  Doppler.   Pulmonic Valve: The pulmonic valve was normal in structure. Pulmonic valve  regurgitation is not visualized by color flow Doppler.   Aorta: The aorta is abnormal in  size  and structure. There is mild  dilatation of the aortic root measuring 38 mm.   Venous: The inferior vena cava measures 1.72 cm, is normal in size with  greater than 50% respiratory variability.    Cardiac catheterization 10/05/2017  1. Severe pulmonary arterial hypertension.  2. Left and right heart filling pressures remain elevated, R>L.  3. Preserved cardiac output.  4. Nonobstructive coronary disease.    Continue IV Lasix for more diuresis, when more diuresed will start sildenafil (probably tomorrow).    Assessment & Plan   1.  Chronic diastolic CHF-stable.  Maintaining on 3- 4 L of nasal cannula oxygen HS and PRN. Continue torsemide Maintain physical activity Heart healthy low-sodium diet  Right-sided heart failure-no increased DOE or activity intolerance.  Recent visit to the emergency department showed stable diagnostics and lab work. Continue torsemide, CPAP  Pulmonary hypertension-reports compliance with nasal cannula oxygen.  Compliant with CPAP.  Recent lab work stable. Continue Demadex, CPAP, nasal cannula O2 through the day.  Essential hypertension-BP today 110/70.  Recent labs reassuring. Continue current medical therapy Heart healthy low-sodium diet  OSA-3-4 L at night.  Continue weight loss Continue CPAP use  Moderate obesity-weight today 179lbs . Maintain physical activity Continue weight loss Reduce calorie diet  Disposition: Follow-up with Dr. Herbie Baltimore or me in 12 months.   Thomasene Ripple. Masie Bermingham NP-C     08/19/2022, 3:40 PM [facility] [address] Office [phone] Fax [phone]    I spent 14 minutes examining this patient, reviewing medications, and using patient centered shared decision making involving her cardiac care.  Prior to her visit I spent greater than 20 minutes reviewing her past medical history,  medications, and prior cardiac tests.

## 2022-08-19 ENCOUNTER — Ambulatory Visit: Payer: Medicare (Managed Care) | Attending: General Practice | Admitting: General Practice

## 2022-08-19 ENCOUNTER — Encounter: Payer: Self-pay | Admitting: General Practice

## 2022-08-19 VITALS — BP 110/70 | HR 76 | Ht 61.0 in | Wt 179.0 lb

## 2022-08-19 DIAGNOSIS — I272 Pulmonary hypertension, unspecified: Secondary | ICD-10-CM

## 2022-08-19 DIAGNOSIS — I5032 Chronic diastolic (congestive) heart failure: Secondary | ICD-10-CM

## 2022-08-19 DIAGNOSIS — I10 Essential (primary) hypertension: Secondary | ICD-10-CM | POA: Diagnosis not present

## 2022-08-19 DIAGNOSIS — I50810 Right heart failure, unspecified: Secondary | ICD-10-CM

## 2022-08-19 DIAGNOSIS — E668 Other obesity: Secondary | ICD-10-CM

## 2022-08-19 NOTE — Patient Instructions (Signed)
Medication Instructions:  The current medical regimen is effective;  continue present plan and medications as directed. Please refer to the Current Medication list given to you today.  *If you need a refill on your cardiac medications before your next appointment, please call your pharmacy*  Lab Work: NONE If you have labs (blood work) drawn today and your tests are completely normal, you will receive your results only by:  MyChart Message (if you have MyChart) OR A paper copy in the mail If you have any lab test that is abnormal or we need to change your treatment, we will call you to review the results.  Other Instructions MAINTAIN PHYSICAL ACTIVITY  PLEASE READ AND FOLLOW ATTACHED  SALTY 6  Follow-Up: At [facility], you and your health needs are our priority.  As part of our continuing mission to provide you with exceptional heart care, we have created designated Provider Care Teams.  These Care Teams include your primary Cardiologist (physician) and Advanced Practice Providers (APPs -  Physician Assistants and Nurse Practitioners) who all work together to provide you with the care you need, when you need it.  Your next appointment:   12 month(s)  Provider:   Bryan Lemma, MD

## 2022-09-17 ENCOUNTER — Other Ambulatory Visit: Payer: Self-pay | Admitting: Gastroenterology

## 2022-10-18 NOTE — Progress Notes (Signed)
Anesthesia Review:  [facility]  Cardiologist : Edd Fabian, NP LOV 08/19/22  Pulm- [place] LOV 07/15/22  Chest x-ray : 07/13/22- 1 view  EKG : 07/14/22  Echo : 2020 Stress test: Cardiac Cath :  2019  Activity level:  Sleep Study/ CPAP : Fasting Blood Sugar :      / Checks Blood Sugar -- times a day:   Blood Thinner/ Instructions /Last Dose: ASA / Instructions/ Last Dose :    Mentally handicapped lives with mother [facility] - 4 days per [facility] per mother   Completed med hx and procedure instructons with MOther, Balke Mettert.  MOther to brin POA papers day of procedure.   Both mother and brother , Kaoru Frechette have POA.     Dm- check glucose once daily  Metformin-   PT uses 3-4 L/[place] of oxygen per mother  PT uses no walker or wheelchair, NO hearing aids or glasses per mother

## 2022-10-25 ENCOUNTER — Other Ambulatory Visit: Payer: Self-pay

## 2022-10-25 ENCOUNTER — Ambulatory Visit (HOSPITAL_COMMUNITY): Payer: Medicare (Managed Care) | Admitting: Anesthesiology

## 2022-10-25 ENCOUNTER — Ambulatory Visit (HOSPITAL_BASED_OUTPATIENT_CLINIC_OR_DEPARTMENT_OTHER): Payer: Medicare (Managed Care) | Admitting: Anesthesiology

## 2022-10-25 ENCOUNTER — Encounter (HOSPITAL_COMMUNITY): Payer: Self-pay | Admitting: Gastroenterology

## 2022-10-25 ENCOUNTER — Ambulatory Visit (HOSPITAL_COMMUNITY)
Admission: RE | Admit: 2022-10-25 | Discharge: 2022-10-25 | Disposition: A | Discharge status: Home / Self Care | Payer: Medicare (Managed Care) | Attending: Gastroenterology | Admitting: Gastroenterology

## 2022-10-25 ENCOUNTER — Encounter (HOSPITAL_COMMUNITY)
Admission: RE | Disposition: A | Discharge status: Home / Self Care | Payer: Self-pay | Source: Home / Self Care | Attending: Gastroenterology

## 2022-10-25 DIAGNOSIS — I5032 Chronic diastolic (congestive) heart failure: Secondary | ICD-10-CM | POA: Diagnosis not present

## 2022-10-25 DIAGNOSIS — E119 Type 2 diabetes mellitus without complications: Secondary | ICD-10-CM | POA: Insufficient documentation

## 2022-10-25 DIAGNOSIS — Z9981 Dependence on supplemental oxygen: Secondary | ICD-10-CM | POA: Insufficient documentation

## 2022-10-25 DIAGNOSIS — K317 Polyp of stomach and duodenum: Secondary | ICD-10-CM

## 2022-10-25 DIAGNOSIS — I110 Hypertensive heart disease with heart failure: Secondary | ICD-10-CM | POA: Insufficient documentation

## 2022-10-25 DIAGNOSIS — K449 Diaphragmatic hernia without obstruction or gangrene: Secondary | ICD-10-CM | POA: Insufficient documentation

## 2022-10-25 DIAGNOSIS — G4730 Sleep apnea, unspecified: Secondary | ICD-10-CM | POA: Diagnosis not present

## 2022-10-25 DIAGNOSIS — K2100 Gastro-esophageal reflux disease with esophagitis, without bleeding: Secondary | ICD-10-CM | POA: Diagnosis not present

## 2022-10-25 DIAGNOSIS — E1122 Type 2 diabetes mellitus with diabetic chronic kidney disease: Secondary | ICD-10-CM

## 2022-10-25 DIAGNOSIS — K2950 Unspecified chronic gastritis without bleeding: Secondary | ICD-10-CM | POA: Diagnosis not present

## 2022-10-25 DIAGNOSIS — K2270 Barrett's esophagus without dysplasia: Secondary | ICD-10-CM | POA: Insufficient documentation

## 2022-10-25 DIAGNOSIS — I130 Hypertensive heart and chronic kidney disease with heart failure and stage 1 through stage 4 chronic kidney disease, or unspecified chronic kidney disease: Secondary | ICD-10-CM | POA: Diagnosis not present

## 2022-10-25 DIAGNOSIS — Z13810 Encounter for screening for upper gastrointestinal disorder: Secondary | ICD-10-CM | POA: Diagnosis not present

## 2022-10-25 DIAGNOSIS — N189 Chronic kidney disease, unspecified: Secondary | ICD-10-CM

## 2022-10-25 DIAGNOSIS — D631 Anemia in chronic kidney disease: Secondary | ICD-10-CM

## 2022-10-25 DIAGNOSIS — I509 Heart failure, unspecified: Secondary | ICD-10-CM

## 2022-10-25 HISTORY — PX: ESOPHAGOGASTRODUODENOSCOPY: SHX5428

## 2022-10-25 HISTORY — PX: BIOPSY: SHX5522

## 2022-10-25 HISTORY — PX: POLYPECTOMY: SHX5525

## 2022-10-25 SURGERY — EGD (ESOPHAGOGASTRODUODENOSCOPY)
Anesthesia: Monitor Anesthesia Care

## 2022-10-25 MED ORDER — PROPOFOL 10 MG/ML IV BOLUS
INTRAVENOUS | Status: DC | PRN
  Administered 2022-10-25 (×3): 10 mg via INTRAVENOUS

## 2022-10-25 MED ORDER — PROPOFOL 500 MG/50ML IV EMUL
INTRAVENOUS | Status: DC | PRN
  Administered 2022-10-25: 100 ug/kg/min via INTRAVENOUS

## 2022-10-25 MED ORDER — LIDOCAINE 2% (20 MG/ML) 5 ML SYRINGE
INTRAMUSCULAR | Status: DC | PRN
  Administered 2022-10-25: 100 mg via INTRAVENOUS

## 2022-10-25 MED ORDER — SODIUM CHLORIDE 0.9 % IV SOLN: INTRAVENOUS | Status: DC

## 2022-10-25 MED ORDER — LACTATED RINGERS IV SOLN: INTRAVENOUS | Status: DC

## 2022-10-25 NOTE — Op Note (Signed)
[facility] Patient Name: Tony Jones Procedure Date: 10/25/2022 MRN: [id number] Attending MD: Vida Rigger , MD, [id number] Date of Birth: [birth year] CSN: [id number] Age: 62 Admit Type: Outpatient Procedure:                Upper GI endoscopy Indications:              Follow-up of Barrett's esophagus, Screening for                            Barrett's esophagus in patient at risk for this                            condition and history of RFA of his Barrett's in                            the past, Abnormal CT of the GI tract Providers:                Vida Rigger, MD, Marge Duncans, RN, Adin Hector,                            RN, Salley Scarlet, Technician, Toula Moos,                            Technician Referring MD:              Medicines:                Monitored Anesthesia Care Complications:            No immediate complications. Estimated Blood Loss:     Estimated blood loss: none. Procedure:                Pre-Anesthesia Assessment:                           - Prior to the procedure, a History and Physical                            was performed, and patient medications and                            allergies were reviewed. The patient's tolerance of                            previous anesthesia was also reviewed. The risks                            and benefits of the procedure and the sedation                            options and risks were discussed with the patient.                            All questions were answered, and informed consent  was obtained. Prior Anticoagulants: The patient has                            taken no anticoagulant or antiplatelet agents. ASA                            Grade Assessment: III - A patient with severe                            systemic disease. After reviewing the risks and                            benefits, the patient was deemed in satisfactory                             condition to undergo the procedure.                           After obtaining informed consent, the endoscope was                            passed under direct vision. Throughout the                            procedure, the patient's blood pressure, pulse, and                            oxygen saturations were monitored continuously. The                            [id number]-[id number] ([id number]) Olympus endoscope was introduced                            through the mouth, and advanced to the third part                            of duodenum. The upper GI endoscopy was                            accomplished without difficulty. The patient                            tolerated the procedure well. Scope In: 12:01:01 PM Scope Out: 12:09:39 PM Total Procedure Duration: 0 hours 8 minutes 38 seconds  Findings:      A small hiatal hernia was present.      There were esophageal mucosal changes suspicious for very short-segment       Barrett's esophagus present at the gastroesophageal junction. Biopsies       were taken with a cold forceps for histology.      A few small semi-sessile polyps with no bleeding and no stigmata of       recent bleeding were found in the cardia, on the greater curvature of       the stomach and on the lesser curvature of the stomach. Biopsies  were       taken with a cold forceps for histology.      The duodenal bulb, first portion of the duodenum, second portion of the       duodenum and third portion of the duodenum were normal.      The exam was otherwise without abnormality. Impression:               - Small hiatal hernia.                           - Esophageal mucosal changes suspicious for very                            short-segment Barrett's esophagus. Biopsied.                           - A few gastric polyps. Biopsied.                           - Normal duodenal bulb, first portion of the                            duodenum, second portion of the duodenum and third                             portion of the duodenum.                           - The examination was otherwise normal. Moderate Sedation:      Not Applicable - Patient had care per Anesthesia. Recommendation:           - Patient has a contact number available for                            emergencies. The signs and symptoms of potential                            delayed complications were discussed with the                            patient. Return to normal activities tomorrow.                            Written discharge instructions were provided to the                            patient.                           - Soft diet today.                           - Continue present medications.                           - Await pathology results.                           -  Return to GI clinic PRN.                           - Telephone GI clinic for pathology results in 1                            week.                           - Telephone GI clinic if symptomatic PRN. Procedure Code(s):        --- Professional ---                           [phone], Esophagogastroduodenoscopy, flexible,                            transoral; with biopsy, single or multiple Diagnosis Code(s):        --- Professional ---                           K44.9, Diaphragmatic hernia without obstruction or                            gangrene                           K22.70, Barrett's esophagus without dysplasia                           K31.7, Polyp of stomach and duodenum                           Z13.810, Encounter for screening for upper                            gastrointestinal disorder                           R93.3, Abnormal findings on diagnostic imaging of                            other parts of digestive tract CPT copyright 2022 [employer] Association. All rights reserved. The codes documented in this report are preliminary and upon coder review may  be revised to meet current compliance  requirements. Vida Rigger, MD 10/25/2022 12:32:19 PM This report has been signed electronically. Number of Addenda: 0

## 2022-10-25 NOTE — Anesthesia Procedure Notes (Signed)
Procedure Name: MAC Date/Time: 10/25/2022 11:56 AM  Performed by: Sindy Guadeloupe, CRNAPre-anesthesia Checklist: Patient identified, Emergency Drugs available, Suction available, Patient being monitored and Timeout performed Comments: optiflow

## 2022-10-25 NOTE — Discharge Instructions (Addendum)
YOU HAD AN ENDOSCOPIC PROCEDURE TODAY: Refer to the procedure report and other information in the discharge instructions given to you for any specific questions about what was found during the examination. If this information does not answer your questions, please call [facility] office at [phone] to clarify.   YOU SHOULD EXPECT: Some feelings of bloating in the abdomen. Passage of more gas than usual. Walking can help get rid of the air that was put into your GI tract during the procedure and reduce the bloating. If you had a lower endoscopy (such as a colonoscopy or flexible sigmoidoscopy) you may notice spotting of blood in your stool or on the toilet paper. Some abdominal soreness may be present for a day or two, also.  DIET: Your first meal following the procedure should be a light meal and then it is ok to progress to your normal diet. A half-sandwich or bowl of soup is an example of a good first meal. Heavy or fried foods are harder to digest and may make you feel nauseous or bloated. Drink plenty of fluids but you should avoid alcoholic beverages for 24 hours. If you had a esophageal dilation, please see attached instructions for diet.    ACTIVITY: Your care partner should take you home directly after the procedure. You should plan to take it easy, moving slowly for the rest of the day. You can resume normal activity the day after the procedure however YOU SHOULD NOT DRIVE, use power tools, machinery or perform tasks that involve climbing or major physical exertion for 24 hours (because of the sedation medicines used during the test).   SYMPTOMS TO REPORT IMMEDIATELY: A gastroenterologist can be reached at any hour. Please call [phone]  for any of the following symptoms:  Following lower endoscopy (colonoscopy, flexible sigmoidoscopy) Excessive amounts of blood in the stool  Significant tenderness, worsening of abdominal pains  Swelling of the abdomen that is new, acute  Fever of 100  or higher  Following upper endoscopy (EGD, EUS, ERCP, esophageal dilation) Vomiting of blood or coffee ground material  New, significant abdominal pain  New, significant chest pain or pain under the shoulder blades  Painful or persistently difficult swallowing  New shortness of breath  Black, tarry-looking or red, bloody stools  FOLLOW UP: Call if GI question or problem otherwise call for biopsy report in 1 week and follow-up as needed If any biopsies were taken you will be contacted by phone or by letter within the next 1-3 weeks. Call [phone]  if you have not heard about the biopsies in 3 weeks.  Please also call with any specific questions about appointments or follow up tests.

## 2022-10-25 NOTE — Transfer of Care (Signed)
Immediate Anesthesia Transfer of Care Note  Patient: Tony Jones  Procedure(s) Performed: ESOPHAGOGASTRODUODENOSCOPY (EGD) BIOPSY POLYPECTOMY  Patient Location: Endoscopy Unit  Anesthesia Type:MAC  Level of Consciousness: drowsy and patient cooperative  Airway & Oxygen Therapy: Patient Spontanous Breathing and Patient connected to nasal cannula oxygen  Post-op Assessment: Report given to RN and Post -op Vital signs reviewed and stable  Post vital signs: Reviewed and stable  Last Vitals:  Vitals Value Taken Time  BP 100/68 10/25/22 1215  Temp    Pulse 73 10/25/22 1216  Resp 13 10/25/22 1216  SpO2 100 % 10/25/22 1216  Vitals shown include unvalidated device data.  Last Pain:  Vitals:   10/25/22 1044  TempSrc: Temporal  PainSc: 0-No pain         Complications: No notable events documented.

## 2022-10-25 NOTE — Anesthesia Preprocedure Evaluation (Addendum)
Anesthesia Evaluation  Patient identified by MRN, date of birth, ID band Patient awake    Reviewed: Allergy & Precautions, NPO status , Patient's Chart, lab work & pertinent test results  History of Anesthesia Complications Negative for: history of anesthetic complications  Airway Mallampati: III  TM Distance: >3 FB Neck ROM: Full   Comment: Previous grade I view with Glidescope 4 Dental  (+) Poor Dentition   Pulmonary neg shortness of breath, sleep apnea and Oxygen sleep apnea , neg COPD, neg recent URI   Pulmonary exam normal breath sounds clear to auscultation       Cardiovascular hypertension (on midodrine), pulmonary hypertension(-) angina +CHF (diastolic, h/o RV failure)   Rhythm:Regular Rate:Normal  HLD  TTE 11/23/2018: IMPRESSIONS    1. The left ventricle has normal systolic function, with an ejection  fraction of 55-60%. The cavity size was normal. moderate basal septal  hypertrophy. Left ventricular diastolic Doppler parameters are consistent  with impaired relaxation. No evidence of   left ventricular regional wall motion abnormalities.   2. The right ventricle has normal systolic function. The cavity was  normal. There is no increase in right ventricular wall thickness. Right  ventricular systolic pressure could not be assessed.   3. The aortic valve is tricuspid. Moderate sclerosis of the aortic valve.   4. The aorta is abnormal in size and structure.   5. There is mild dilatation of the aortic root measuring 38 mm.   6. The interatrial septum appears to be lipomatous.   R/LHC 10/05/2017: 1. Severe pulmonary arterial hypertension.  2. Left and right heart filling pressures remain elevated, R>L.  3. Preserved cardiac output.  4. Nonobstructive coronary disease.     Neuro/Psych neg Seizures PSYCHIATRIC DISORDERS Anxiety     Developmental delay    GI/Hepatic ,GERD (Barrett's esophagus)  Medicated,,   Endo/Other  diabetes    Renal/GU CRFRenal disease (h/o stones)     Musculoskeletal   Abdominal  (+) + obese  Peds  Hematology  (+) Blood dyscrasia, anemia   Anesthesia Other Findings Uses 3-4 LPM O2 via [place]  Reproductive/Obstetrics                              Anesthesia Physical Anesthesia Plan  ASA: 4  Anesthesia Plan: MAC   Post-op Pain Management: Minimal or no pain anticipated   Induction: Intravenous  PONV Risk Score and Plan: 1 and Propofol infusion and Treatment may vary due to age or medical condition  Airway Management Planned: Natural Airway and Simple Face Mask  Additional Equipment:   Intra-op Plan:   Post-operative Plan:   Informed Consent: I have reviewed the patients History and Physical, chart, labs and discussed the procedure including the risks, benefits and alternatives for the proposed anesthesia with the patient or authorized representative who has indicated his/her understanding and acceptance.     Dental advisory given and Consent reviewed with POA (Consent reviewed with patient's brother)  Plan Discussed with: CRNA and Anesthesiologist  Anesthesia Plan Comments: (Discussed with patient risks of MAC including, but not limited to, minor pain or discomfort, hearing people in the room, and possible need for backup general anesthesia. Risks for general anesthesia also discussed including, but not limited to, sore throat, hoarse voice, chipped/damaged teeth, injury to vocal cords, nausea and vomiting, allergic reactions, lung infection, heart attack, stroke, and death. All questions answered. )         Anesthesia Quick Evaluation

## 2022-10-25 NOTE — Anesthesia Postprocedure Evaluation (Signed)
Anesthesia Post Note  Patient: [name]  Procedure(s) Performed: ESOPHAGOGASTRODUODENOSCOPY (EGD) BIOPSY POLYPECTOMY     Patient location during evaluation: PACU Anesthesia Type: MAC Level of consciousness: awake Pain management: pain level controlled Vital Signs Assessment: post-procedure vital signs reviewed and stable Respiratory status: spontaneous breathing, nonlabored ventilation and respiratory function stable Cardiovascular status: stable and blood pressure returned to baseline Postop Assessment: no apparent nausea or vomiting Anesthetic complications: no   No notable events documented.  Last Vitals:  Vitals:   10/25/22 1240 10/25/22 1245  BP: 121/81 132/79  Pulse: 71 73  Resp: 13 15  Temp:    SpO2: 95% 95%    Last Pain:  Vitals:   10/25/22 1245  TempSrc:   PainSc: 0-No pain                 Linton Rump

## 2022-10-25 NOTE — Progress Notes (Signed)
Tony Jones 11:54 AM  Subjective: Patient doing well without any new problems since I saw him recently in the office  Objective: Vital signs stable afebrile exam please see preassessment evaluation  Assessment: History of Barrett's status post RFA with abnormal esophagus on CT  Plan: Okay to proceed with endoscopy with anesthesia assistance  [facility]  office [phone] After 5PM or if no answer call [phone]

## 2022-10-27 LAB — SURGICAL PATHOLOGY

## 2022-10-29 ENCOUNTER — Encounter (HOSPITAL_COMMUNITY): Payer: Self-pay | Admitting: Gastroenterology

## 2022-11-09 ENCOUNTER — Encounter: Payer: Self-pay | Admitting: Pulmonary Disease

## 2022-11-09 ENCOUNTER — Ambulatory Visit (INDEPENDENT_AMBULATORY_CARE_PROVIDER_SITE_OTHER): Payer: Medicare (Managed Care) | Admitting: Pulmonary Disease

## 2022-11-09 VITALS — BP 108/62 | HR 87 | Ht 61.0 in | Wt 183.0 lb

## 2022-11-09 DIAGNOSIS — I272 Pulmonary hypertension, unspecified: Secondary | ICD-10-CM

## 2022-11-09 DIAGNOSIS — I5032 Chronic diastolic (congestive) heart failure: Secondary | ICD-10-CM

## 2022-11-09 NOTE — Progress Notes (Signed)
Tony Jones    [id number]    Jun 18, 1960  Primary Care Physician:Inc, [facility]  Referring Physician: Inc, [facility] [address],  [place] [address]  Chief complaint:   Patient with pulmonary hypertension, chronic hypoxemia Accompanied by his mom  HPI:  Patient with a history of muscular dystrophy, accompanied by his mom  He offers no significant complaints at present States his breathing feels fine  Denies any chest pains or chest discomfort  Chronic hypoxemia, developmental delay Has had chronic hypoxemic respiratory failure for which has been on oxygen supplementation Has severe pulmonary hypertension, was unable to tolerate vasodilator therapy Requires midodrine to maintain his blood pressure  History of heart failure  Follows up with cardiology  Never smoker  Limited activities Usually not interested in participating in exercises Does go to group meetings on a daily basis, they do try to keep an active  Concerns have been raised about sleep disordered breathing, his mom does not think he will tolerate a CPAP  Outpatient Encounter Medications as of 11/09/2022  Medication Sig   acetaminophen (TYLENOL) 650 MG CR tablet Take 650 mg by mouth every 8 (eight) hours as needed for pain.   allopurinol (ZYLOPRIM) 300 MG tablet Take 300 mg by mouth daily.    atorvastatin (LIPITOR) 40 MG tablet Take 1 tablet (40 mg total) by mouth daily at 6 PM.   citalopram (CELEXA) 20 MG tablet Take 20 mg by mouth daily.   desoximetasone (TOPICORT) 0.25 % cream Apply 1 Application topically 2 (two) times daily as needed (eczema).   docusate sodium (COLACE) 100 MG capsule Take 100 mg by mouth daily as needed for mild constipation.    Emollient (LUBRIDERM ADVANCED THERAPY) CREA Apply 1 Application topically 2 (two) times daily as needed (For eczema).   hydrOXYzine (ATARAX) 10 MG tablet Take 10 mg by mouth every 8  (eight) hours as needed for itching.   Iron-Vitamin C (VITRON-C) 65-125 MG TABS Take 1 tablet by mouth daily.   levocetirizine (XYZAL) 5 MG tablet Take 5 mg by mouth every other day.   LORazepam (ATIVAN) 0.5 MG tablet Take 0.5 mg by mouth 2 (two) times daily as needed for anxiety.   Menthol, Topical Analgesic, (BIOFREEZE PROFESSIONAL) 5 % GEL Apply 1 application  topically every 4 (four) hours as needed (pain).   midodrine (PROAMATINE) 5 MG tablet Take 1 tablet (5 mg total) by mouth See admin instructions. Take 1 tablet (5 mg) by mouth 3 times daily, except take an additional tablet (5 mg) by mouth at midday on center days to maintain adequate blood pressure.   Multiple Vitamin (MULTIVITAMIN WITH MINERALS) TABS tablet Take 1 tablet by mouth daily with breakfast.    ondansetron (ZOFRAN) 4 MG tablet Take 4 mg by mouth every 8 (eight) hours as needed for nausea or vomiting.   pantoprazole (PROTONIX) 40 MG tablet Take 40 mg by mouth 2 (two) times daily with a meal.    potassium chloride SA (K-DUR,KLOR-CON) 20 MEQ tablet Take 1 tablet (20 mEq total) by mouth daily.   senna-docusate (SENOKOT-S) 8.6-50 MG tablet Take 2 tablets by mouth at bedtime.   torsemide (DEMADEX) 20 MG tablet Take 20 mg by mouth 2 (two) times daily.   No facility-administered encounter medications on file as of 11/09/2022.    Allergies as of 11/09/2022 - Review Complete 11/09/2022  Allergen Reaction Noted   Codeine Nausea  And Vomiting 09/09/2015    Past Medical History:  Diagnosis Date   Anemia    Anxiety    Barrett esophagus    Cholelithiasis    Chronic respiratory failure with hypoxia (HCC)    CKD (chronic kidney disease) stage 3, GFR 30-59 ml/min (HCC)    Developmental delay    Diabetes mellitus type 2, uncontrolled    Dyslipidemia    Gallstones 01/2019   GERD (gastroesophageal reflux disease)    Hypertension    Mental disorder    Chronic developmental delay.; still lives with mother as primary care giver; spends  2-3 days in adult day-care.   Nephrolithiasis    Pulmonary hypertension (HCC)    Likely related to long-standing hypoxia and possible OSA/OHS (group 3) & ~DHF (Group 2); -- No longer on Pulmnoary Vasodilator (family request)   Right heart failure [facility])    Recovered as on July 2020    Past Surgical History:  Procedure Laterality Date   BIOPSY  10/25/2022   Procedure: BIOPSY;  Surgeon: Vida Rigger, MD;  Location: [facility];  Service: Gastroenterology;;   CHOLECYSTECTOMY N/A 01/24/2019   Procedure: LAPAROSCOPIC CHOLECYSTECTOMY WITH INTRAOPERATIVE CHOLANGIOGRAM;  Surgeon: Manus Rudd, MD;  [facility] OR;  Service: General;  Laterality: N/A;   CYSTOSCOPY/URETEROSCOPY/HOLMIUM LASER/STENT PLACEMENT Right 11/14/2017   Procedure: CYSTOSCOPY/URETEROSCOPY/RETROGRADE/STENT PLACEMENT;  Surgeon: Marcine Matar, MD;  Location: [facility] ORS;  Service: Urology;  Laterality: Right;  75 MINS  SPINAL OR MAC ANESTHESIA  ALSO EXTRACTION OF STONE   ESOPHAGOGASTRODUODENOSCOPY N/A 08/02/2017   Procedure: ESOPHAGOGASTRODUODENOSCOPY (EGD);  Surgeon: Vida Rigger, MD;  Location: [facility];  Service: Endoscopy;  Laterality: N/A;   ESOPHAGOGASTRODUODENOSCOPY N/A 10/25/2022   Procedure: ESOPHAGOGASTRODUODENOSCOPY (EGD);  Surgeon: Vida Rigger, MD;  Location: [facility];  Service: Gastroenterology;  Laterality: N/A;   GIVENS CAPSULE STUDY N/A 10/12/2017   Procedure: GIVENS CAPSULE STUDY;  Surgeon: Vida Rigger, MD;  Location: [facility] ENDOSCOPY;  Service: Endoscopy;  Laterality: N/A;   POLYPECTOMY  10/25/2022   Procedure: POLYPECTOMY;  Surgeon: Vida Rigger, MD;  Location: [facility] ENDOSCOPY;  Service: Gastroenterology;;   RIGHT/LEFT HEART CATH AND CORONARY ANGIOGRAPHY N/A 10/05/2017   Procedure: RIGHT/LEFT HEART CATH AND CORONARY ANGIOGRAPHY;  Surgeon: Laurey Morale, MD =  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 (R>L). RAP 16 mmHg, RVP 92/16 mmHg; PAP 93/51 mmHg mean 67, PCWP mn27 mmHg. LVP 110/21 -  EDP 21 mmHg. AoP 115/75 mmHg.  PaO2 56%, AoO2 99%. CO/I (Fick) 4.95-2.65, PVR 8 WU, PAPi 2.65 (PVR 8 WU)   THROAT SURGERY     TRANSTHORACIC ECHOCARDIOGRAM  01/2015   Mild concentric LVH.  EF 60 to 65%.  GR 1 DD, but high filling pressures.  IVS had diastolic and systolic flattening consistent with volume and pressure overload.  PA pressures estimated 75 mmHg.   TRANSTHORACIC ECHOCARDIOGRAM  11/2018   Normal LV size and function.  EF 55 to 60%.  Moderate basal septal hypertrophy.  GR 1 DD.  No R WMA.  Normal RV size with preserved/normal function.  Moderate aortic sclerosis but no stenosis.  Mild aortic root dilation at 38 mm.  Lipomatous IAS    Family History  Problem Relation Age of Onset   Congestive Heart Failure Father    Heart disease Father    Heart attack Father    Atrial fibrillation Father    Brain cancer Father    Melanoma Father    Diabetes Mother    Stroke Mother    Hypertension Mother  Fainting Paternal Grandfather    Congestive Heart Failure Paternal Grandfather    Atrial fibrillation Paternal Grandfather     Social History   Socioeconomic History   Marital status: Single    Spouse name: Not on file   Number of children: Not on file   Years of education: Not on file   Highest education level: Not on file  Occupational History   Occupation: unemployed  Tobacco Use   Smoking status: Never   Smokeless tobacco: Never  Vaping Use   Vaping Use: Never used  Substance and Sexual Activity   Alcohol use: No   Drug use: No   Sexual activity: Never  Other Topics Concern   Not on file  Social History Narrative   Not on file   Social Determinants of Health   Financial Resource Strain: Not on file  Food Insecurity: Not on file  Transportation Needs: Not on file  Physical Activity: Not on file  Stress: Not on file  Social Connections: Not on file  Intimate Partner Violence: Not on file    Review of Systems  Constitutional:  Negative for fatigue and fever.   Respiratory:  Positive for shortness of breath.   Psychiatric/Behavioral:  Positive for sleep disturbance.     Vitals:   11/09/22 1140  BP: 108/62  Pulse: 87  SpO2: 91%     Physical Exam Constitutional:      Appearance: He is obese.  HENT:     Head: Normocephalic.     Mouth/Throat:     Mouth: Mucous membranes are moist.  Eyes:     General: No scleral icterus.    Pupils: Pupils are equal, round, and reactive to light.  Cardiovascular:     Rate and Rhythm: Normal rate and regular rhythm.     Heart sounds: No murmur heard.    No friction rub.  Pulmonary:     Effort: No respiratory distress.     Breath sounds: No stridor. No wheezing or rhonchi.  Musculoskeletal:     Cervical back: No rigidity or tenderness.  Neurological:     Mental Status: He is alert.  Psychiatric:        Mood and Affect: Mood normal.   Data Reviewed: Echocardiogram 11/22/2020 reviewed  Records from cardiology follow-ups reviewed-recently followed up with Dr. Herbie Baltimore  CT scan from June 2022 reviewed  Assessment:  Severe pulmonary hypertension -Unable to tolerate vasodilator therapy  Chronic respiratory failure -Encouraged to continue oxygen supplementation  History of congestive heart failure -On cautious diuretic therapy  History of muscular dystrophy  Deconditioning  Moderate probability of significant sleep disordered breathing and obesity hypoventilation  Plan/Recommendations: Continue oxygen supplementation  Graded activities as tolerated  Progressive also call with any significant concerns  Follow-up in 6 months   Virl Diamond MD  Pulmonary and Critical Care 11/09/2022, 11:55 AM  CC: Inc, [employer]*

## 2022-11-09 NOTE — Patient Instructions (Signed)
Continue graded activities as tolerated  Use oxygen with activities  Call us with any significant concerns  Follow-up in 6 months

## 2023-07-04 ENCOUNTER — Ambulatory Visit (INDEPENDENT_AMBULATORY_CARE_PROVIDER_SITE_OTHER): Payer: Medicare (Managed Care) | Admitting: Pulmonary Disease

## 2023-07-04 ENCOUNTER — Encounter: Payer: Self-pay | Admitting: Pulmonary Disease

## 2023-07-04 VITALS — BP 112/74 | HR 89 | Ht 61.5 in | Wt 189.6 lb

## 2023-07-04 DIAGNOSIS — I5032 Chronic diastolic (congestive) heart failure: Secondary | ICD-10-CM | POA: Diagnosis not present

## 2023-07-04 DIAGNOSIS — J9611 Chronic respiratory failure with hypoxia: Secondary | ICD-10-CM | POA: Diagnosis not present

## 2023-07-04 DIAGNOSIS — I272 Pulmonary hypertension, unspecified: Secondary | ICD-10-CM

## 2023-07-04 DIAGNOSIS — Z9981 Dependence on supplemental oxygen: Secondary | ICD-10-CM

## 2023-07-04 NOTE — Patient Instructions (Signed)
 Follow-up in 6 months  Continue oxygen supplementation  Continue oxygen use at night  Try and talk to somebody in school regarding the concerns about the oxygen blowing up  Continue graded activities as tolerated  Call us with significant concerns

## 2023-07-04 NOTE — Progress Notes (Signed)
 Tony Jones    [id number]    05/08/1960  Primary Care Physician:Inc, [facility]  Referring Physician: Inc, [facility] [address] [place],  [place] [address]  Chief complaint:   Patient with pulmonary hypertension, chronic hypoxemia Accompanied by his mom  HPI:  Patient with a history of muscular dystrophy  Chronic respiratory failure, on oxygen supplementation  No significant complaints  Persistently concerned about use of oxygen in school, he has a fear that the oxygen system will blow up  Denies any pain or discomfort  Chronic hypoxemia, developmental delay Chronic hypoxemic respiratory failure for which he is on oxygen supplementation Has severe pulmonary hypertension, unable to tolerate vasodilator therapy -He does require midodrine to maintain his blood pressure  He does have a history of heart failure, follows up with cardiology  Never smoker  Limited activities Usually not interested in participating in exercises Does go to group meetings on a daily basis, they do try to keep an active  Concerns have been raised about sleep disordered breathing, his mom does not think he will tolerate a CPAP  Outpatient Encounter Medications as of 07/04/2023  Medication Sig   acetaminophen (TYLENOL) 650 MG CR tablet Take 650 mg by mouth every 8 (eight) hours as needed for pain.   allopurinol (ZYLOPRIM) 300 MG tablet Take 300 mg by mouth daily.    atorvastatin (LIPITOR) 40 MG tablet Take 1 tablet (40 mg total) by mouth daily at 6 PM.   citalopram (CELEXA) 20 MG tablet Take 20 mg by mouth daily.   desoximetasone (TOPICORT) 0.25 % cream Apply 1 Application topically 2 (two) times daily as needed (eczema).   docusate sodium (COLACE) 100 MG capsule Take 100 mg by mouth daily as needed for mild constipation.    Emollient (LUBRIDERM ADVANCED THERAPY) CREA Apply 1 Application topically 2 (two) times daily as needed  (For eczema).   hydrOXYzine (ATARAX) 10 MG tablet Take 10 mg by mouth every 8 (eight) hours as needed for itching.   Iron-Vitamin C (VITRON-C) 65-125 MG TABS Take 1 tablet by mouth daily.   levocetirizine (XYZAL) 5 MG tablet Take 5 mg by mouth every other day.   LORazepam (ATIVAN) 0.5 MG tablet Take 0.5 mg by mouth 2 (two) times daily as needed for anxiety.   Menthol, Topical Analgesic, (BIOFREEZE PROFESSIONAL) 5 % GEL Apply 1 application  topically every 4 (four) hours as needed (pain).   midodrine (PROAMATINE) 5 MG tablet Take 1 tablet (5 mg total) by mouth See admin instructions. Take 1 tablet (5 mg) by mouth 3 times daily, except take an additional tablet (5 mg) by mouth at midday on center days to maintain adequate blood pressure.   Multiple Vitamin (MULTIVITAMIN WITH MINERALS) TABS tablet Take 1 tablet by mouth daily with breakfast.    ondansetron (ZOFRAN) 4 MG tablet Take 4 mg by mouth every 8 (eight) hours as needed for nausea or vomiting.   pantoprazole (PROTONIX) 40 MG tablet Take 40 mg by mouth 2 (two) times daily with a meal.    potassium chloride SA (K-DUR,KLOR-CON) 20 MEQ tablet Take 1 tablet (20 mEq total) by mouth daily.   senna-docusate (SENOKOT-S) 8.6-50 MG tablet Take 2 tablets by mouth at bedtime.   torsemide (DEMADEX) 20 MG tablet Take 20 mg by mouth 2 (two) times daily.   No facility-administered encounter medications on file as of 07/04/2023.    Allergies as  of 07/04/2023 - Review Complete 07/04/2023  Allergen Reaction Noted   Codeine Nausea And Vomiting 09/09/2015    Past Medical History:  Diagnosis Date   Anemia    Anxiety    Barrett esophagus    Cholelithiasis    Chronic respiratory failure with hypoxia (HCC)    CKD (chronic kidney disease) stage 3, GFR 30-59 ml/min (HCC)    Developmental delay    Diabetes mellitus type 2, uncontrolled    Dyslipidemia    Gallstones 01/2019   GERD (gastroesophageal reflux disease)    Hypertension    Mental disorder     Chronic developmental delay.; still lives with mother as primary care giver; spends 2-3 days in adult day-care.   Nephrolithiasis    Pulmonary hypertension (HCC)    Likely related to long-standing hypoxia and possible OSA/OHS (group 3) & ~DHF (Group 2); -- No longer on Pulmnoary Vasodilator (family request)   Right heart failure [facility])    Recovered as on July 2020    Past Surgical History:  Procedure Laterality Date   BIOPSY  10/25/2022   Procedure: BIOPSY;  Surgeon: Vida Rigger, MD;  Location: [facility];  Service: Gastroenterology;;   CHOLECYSTECTOMY N/A 01/24/2019   Procedure: LAPAROSCOPIC CHOLECYSTECTOMY WITH INTRAOPERATIVE CHOLANGIOGRAM;  Surgeon: Manus Rudd, MD;  Location: [facility] OR;  Service: General;  Laterality: N/A;   CYSTOSCOPY/URETEROSCOPY/HOLMIUM LASER/STENT PLACEMENT Right 11/14/2017   Procedure: CYSTOSCOPY/URETEROSCOPY/RETROGRADE/STENT PLACEMENT;  Surgeon: Marcine Matar, MD;  Location: [facility] ORS;  Service: Urology;  Laterality: Right;  75 MINS  SPINAL OR MAC ANESTHESIA  ALSO EXTRACTION OF STONE   ESOPHAGOGASTRODUODENOSCOPY N/A 08/02/2017   Procedure: ESOPHAGOGASTRODUODENOSCOPY (EGD);  Surgeon: Vida Rigger, MD;  Location: [facility];  Service: Endoscopy;  Laterality: N/A;   ESOPHAGOGASTRODUODENOSCOPY N/A 10/25/2022   Procedure: ESOPHAGOGASTRODUODENOSCOPY (EGD);  Surgeon: Vida Rigger, MD;  Location: [facility];  Service: Gastroenterology;  Laterality: N/A;   GIVENS CAPSULE STUDY N/A 10/12/2017   Procedure: GIVENS CAPSULE STUDY;  Surgeon: Vida Rigger, MD;  Location: [facility] END[facility]COPY;  Service: Endoscopy;  Laterality: N/A;   POLYPECTOMY  10/25/2022   Procedure: POLYPECTOMY;  Surgeon: Vida Rigger, MD;  Location: [facility] END[facility]COPY;  Service: Gastroenterology;;   RIGHT/LEFT HEART CATH AND CORONARY ANGIOGRAPHY N/A 10/05/2017   Procedure: RIGHT/LEFT HEART CATH AND CORONARY ANGIOGRAPHY;  Surgeon: Laurey Morale, MD =  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  (R>L). RAP 16 mmHg, RVP 92/16 mmHg; PAP 93/51 mmHg mean 67, PCWP mn27 mmHg. LVP 110/21 - EDP 21 mmHg. AoP 115/75 mmHg.  PaO2 56%, AoO2 99%. CO/I (Fick) 4.95-2.65, PVR 8 WU, PAPi 2.65 (PVR 8 WU)   THROAT SURGERY     TRANSTHORACIC ECHOCARDIOGRAM  01/2015   Mild concentric LVH.  EF 60 to 65%.  GR 1 DD, but high filling pressures.  IVS had diastolic and systolic flattening consistent with volume and pressure overload.  PA pressures estimated 75 mmHg.   TRANSTHORACIC ECHOCARDIOGRAM  11/2018   Normal LV size and function.  EF 55 to 60%.  Moderate basal septal hypertrophy.  GR 1 DD.  No R WMA.  Normal RV size with preserved/normal function.  Moderate aortic sclerosis but no stenosis.  Mild aortic root dilation at 38 mm.  Lipomatous IAS    Family History  Problem Relation Age of Onset   Congestive Heart Failure Father    Heart disease Father    Heart attack Father    Atrial fibrillation Father    Brain cancer Father    Melanoma Father  Diabetes Mother    Stroke Mother    Hypertension Mother    Fainting Paternal Grandfather    Congestive Heart Failure Paternal Grandfather    Atrial fibrillation Paternal Grandfather     Social History   Socioeconomic History   Marital status: Single    Spouse name: Not on file   Number of children: Not on file   Years of education: Not on file   Highest education level: Not on file  Occupational History   Occupation: unemployed  Tobacco Use   Smoking status: Never   Smokeless tobacco: Never  Vaping Use   Vaping status: Never Used  Substance and Sexual Activity   Alcohol use: No   Drug use: No   Sexual activity: Never  Other Topics Concern   Not on file  Social History Narrative   Not on file   Social Drivers of Health   Financial Resource Strain: Not on file  Food Insecurity: Not on file  Transportation Needs: Not on file  Physical Activity: Not on file  Stress: Not on file  Social Connections: Not on file  Intimate Partner Violence:  Not on file    Review of Systems  Constitutional:  Negative for fatigue and fever.  Respiratory:  Positive for shortness of breath.   Psychiatric/Behavioral:  Positive for sleep disturbance.     There were no vitals filed for this visit.    Physical Exam Constitutional:      Appearance: He is obese.  HENT:     Head: Normocephalic.     Mouth/Throat:     Mouth: Mucous membranes are moist.  Eyes:     General: No scleral icterus.    Pupils: Pupils are equal, round, and reactive to light.  Cardiovascular:     Rate and Rhythm: Normal rate and regular rhythm.     Heart sounds: No murmur heard.    No friction rub.  Pulmonary:     Effort: No respiratory distress.     Breath sounds: No stridor. No wheezing or rhonchi.  Musculoskeletal:     Cervical back: No rigidity or tenderness.  Neurological:     Mental Status: He is alert.  Psychiatric:        Mood and Affect: Mood normal.    Data Reviewed: Echocardiogram 11/22/2020 reviewed  Records from cardiology follow-ups reviewed-recently followed up with Dr. Herbie Baltimore  CT scan from June 2022 reviewed  Assessment:  Severe pulmonary hypertension -Was unable to tolerate vasodilator therapy  Chronic respiratory failure -Encouraged to continue using oxygen around-the-clock  He has a fear that the oxygen system will blow up while in school -I did try and reassure him -I did encourage his mother to talk to someone in school regarding this concern  History of congestive heart failure -On diuretic therapy  History of muscular dystrophy  Deconditioning  Significant probability of significant sleep disordered breathing and obesity hypoventilation -Will not tolerate anything over his face  Oxygen level was in the 70s when he was seen initially, was placed on oxygen supplementation with saturations in the 90s currently   Plan/Recommendations: Continue oxygen supplementation  Graded activities as tolerated  Weight loss  efforts  He does have multiple devices for oxygen supplementation at home  Encouraged mom to talk to someone in school without Jones concerned about the oxygen system blowing up  Tentative follow-up in 6 months  I spent 30 minutes dedicated to the care of this patient on the date of this encounter to include previsit review  of records, face-to-face time with the patient discussing conditions above, post visit ordering of testing,ordering medications,independentlyinterpreting results, clinical documentation with electronic health record and communicated necessary findings to members of the patient's care team  Virl Diamond MD [facility] 07/04/2023, 10:02 AM  CC: Inc, [employer] [facility]*

## 2023-08-24 ENCOUNTER — Encounter: Payer: Self-pay | Admitting: Cardiology

## 2023-08-24 ENCOUNTER — Ambulatory Visit: Payer: Medicare (Managed Care) | Attending: Cardiology | Admitting: Cardiology

## 2023-08-24 VITALS — BP 94/64 | HR 92 | Ht 61.5 in | Wt 189.2 lb

## 2023-08-24 DIAGNOSIS — I951 Orthostatic hypotension: Secondary | ICD-10-CM | POA: Diagnosis not present

## 2023-08-24 DIAGNOSIS — I272 Pulmonary hypertension, unspecified: Secondary | ICD-10-CM

## 2023-08-24 DIAGNOSIS — I50810 Right heart failure, unspecified: Secondary | ICD-10-CM

## 2023-08-24 DIAGNOSIS — E669 Obesity, unspecified: Secondary | ICD-10-CM

## 2023-08-24 DIAGNOSIS — E785 Hyperlipidemia, unspecified: Secondary | ICD-10-CM

## 2023-08-24 DIAGNOSIS — I5032 Chronic diastolic (congestive) heart failure: Secondary | ICD-10-CM

## 2023-08-24 NOTE — Progress Notes (Signed)
 Cardiology Office Note:  .   Date:  08/28/2023  ID:  Tony Jones, DOB March 27, 1961, MRN [id number] PCP: Inc, [facility]  [facility] Providers Cardiologist:  Randene Bustard, MD     Chief Complaint  Patient presents with   Follow-up    Annual follow-up.  History of WHO class II-III PAH    Patient Profile: .     Tony Jones is an obese 63 y.o. 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 He follows up  at the request of Inc, [address] A*.  In addition to his pulmonary pretension, he has longstanding hypotension requiring midodrine . Given history of congenital cognitive delay, he is accompanied by his mother who is his primary caregiver.    I last saw Tony Jones back in March 2023 and he was doing quite well.  Trying to stay active with exercising.  Not really having any issues with irregular heartbeats, but was having some dizziness requiring midodrine .  Exertional dyspnea mostly because of deconditioning.  Edema well-controlled with torsemide .  Darshan was last seen on August 19, 2022 by Lawana Pray, NP for routine follow-up doing quite well.  He presented with his mom and mentioned recent ER visit for hypoxia, and had been seen by his pulmonologist shortly prior to his cardiology visit.  Having issues with tolerating CPAP.  Continue recommend weight loss and exercise.  At minimum-nighttime oxygen .  He underwent EGD in June 2024  Subjective  Discussed the use of AI scribe software for clinical note transcription with the patient, who gave verbal consent to proceed.  History of Present Illness History of Present Illness Tony Jones is a 63 year old male with a history of congenital cognitive delay with PMH notable for OSA with Pulmonary HTN (requiring hospitalization several years ago.  He has  chronically low blood pressure and cognitive delay making management difficult.  His breathing has improved, and he uses supplemental oxygen  at night but does not use a CPAP machine - not able to tolerate CPAP. He attends a day program at [facility] and has become more comfortable and enjoys being there. He stays active and is encouraged to continue his physical activities.  He has started an exercise program at [facility] and is attempting weight loss.   He denies any exertional dyspnea routine exercise, but does note dyspnea when walking uphill, such as taking the garbage can up the driveway, but generally tolerates physical activity well.  However, he denies chest pain, tightness, or heart palpitations. He denies any issues with his heart skipping beats.  No syncope or near complete event no TIA or's, no claudication  He has a history of hypotension and is currently taking midodrine  three times a day to manage it. He has not experienced any dizziness or syncope recently. He is also on torsemide  twice a day to manage edema, which has improved since a previous hospitalization. He reports occasional peripheral edema in his hands and feet, but it is not as severe as before - is on a stable dose of torsemide .    Objective  Current CV Meds      atorvastatin  (LIPITOR) 40 MG tablet midodrine  (PROAMATINE ) 5 MG tablet  torsemide   (DEMADEX ) 20 MG tablet potassium chloride  SA (K-DUR,KLOR-CON ) 20 MEQ tablet Take 1 tablet (40 mg total) by mouth  daily at 6 PM. Take 1 tablet (5 mg total) by mouth See admin instructions. Take 1 tablet (5 mg) by mouth 3 times daily, except take an additional tablet (5 mg) by mouth at midday on center days to maintain adequate blood pressure. (Patient taking differently: Take 5 mg by mouth 3 (three) times daily.) Take 20 mg by mouth 2 (two) times daily. Take 1 tablet (20 mEq total) by mouth daily.     Other Medications Sig   allopurinol  (ZYLOPRIM ) 300 MG tablet Take 300 mg  by mouth daily.    citalopram  (CELEXA ) 20 MG tablet Take 20 mg by mouth daily.   Iron-Vitamin C (VITRON-C) 65-125 MG TABS Take 1 tablet by mouth daily.   levocetirizine (XYZAL ) 5 MG tablet Take 5 mg by mouth every other day.   pantoprazole  (PROTONIX ) 40 MG tablet Take 40 mg by mouth 2 (two) times daily with a meal.    senna-docusate (SENOKOT-S) 8.6-50 MG tablet Take 2 tablets by mouth at bedtime.   Studies Reviewed: Aaron Aas   EKG Interpretation Date/Time:  Wednesday August 24 2023 10:31:09 EDT Ventricular Rate:  92 PR Interval:  158 QRS Duration:  90 QT Interval:  386 QTC Calculation: 477 R Axis:   94  Text Interpretation: Normal sinus rhythm Rightward axis ST & T wave abnormality, consider anterior ischemia T wave inversion Inferior leads Prolonged QT When compared with ECG of 13-Jul-2022 10:06, No significant change since last tracing Confirmed by Randene Bustard ([id number]) on 08/24/2023 10:38:23 AM    ECHO 11/23/2018: LVEF 55 to 60% no RWMA moderate septal-hypertrophy.  G1 DD.  Moderate AOV sclerosis with no stenosis.  Unable to assess PAP. Right and Left Heart CATH: Severe P AH (WHO 2 and 3).  Elevated filling pressures in both right and left heart right greater than left.  Preserved CO-CI.  Nonobstructive CAD  Risk Assessment/Calculations:           Physical Exam:   VS:  BP 94/64 (BP Location: Left Arm, Patient Position: Sitting, Cuff Size: Large)   Pulse 92   Ht 5' 1.5" (1.562 m)   Wt 189 lb 3.2 oz (85.8 kg)   BMI 35.17 kg/m    Wt Readings from Last 3 Encounters:  08/24/23 189 lb 3.2 oz (85.8 kg)  07/04/23 189 lb 9.6 oz (86 kg)  11/09/22 183 lb (83 kg)    GEN: Well nourished, well groomed in no acute distress; actually in good spirits today.  Moderately obese but otherwise healthy-appearing.  Very upset about his mother having fallen recently on several occasions. NECK: Very thick neck-difficult to assess JVD No carotid bruits CARDIAC: Heart sounds.  RRR with normal S1 and S2, soft  1/6 SEM LUSB. RESPIRATORY:  Clear to auscultation without rales, wheezing or rhonchi ; nonlabored, good air movement. ABDOMEN: Soft, non-tender, non-distended EXTREMITIES: Trivial ankle edema; No deformity     ASSESSMENT AND PLAN: .    Problem List Items Addressed This Visit       Cardiology Problems   Chronic diastolic heart failure (HCC) (Chronic)   Diastolic pressures were elevated at the time of his heart catheterization in setting of volume overload.  Has not had issues since. -Continue same dose of torsemide  Potassium supplement prevents hypokalemia from diuretics. - Continue potassium supplement.      Relevant Orders   EKG 12-Lead (Completed)   Postural hypotension (Chronic)   Longstanding hypotension.  Managed with TID midodrine .  Unfortunately this limits use of GDMT for PAH. - Continue midodrine   5 mg three times daily. - Ensure adequate hydration.      Pulmonary hypertension (HCC) - Primary (Chronic)   Unable to use vasodilators because of longstanding hypotension.  Thankfully, he is breathing much better now and seems to be relatively well-controlled. - Continue nighttime oxygen  use as he is not able to tolerate CPAP -Continue standing dose of torsemide  but monitor for hypotension.      Relevant Orders   EKG 12-Lead (Completed)   RVF (right ventricular failure) (HCC) (Chronic)   Seems to be stable from a symptom standpoint.  Mostly related to [facility].  See PAH section.      Relevant Orders   EKG 12-Lead (Completed)     Other   Dyslipidemia (Chronic)   Lipitor for cholesterol management. Compliance noted. Labs not available-managed by [facility] - Continue Lipitor 40 mg.      Moderate obesity (Chronic)   Working on exercise regimen.  Doing well with this.  Hoping to lose more weight.             Follow-Up: Return in about 1 year (around 08/23/2024) for 1 Yr Follow-up, [employer].  Recording duration: 17 minutes; 14 minutes in  documentation   Signed, Arleen Lacer, MD, MS Randene Bustard, M.D., M.S. Interventional Cardiologist  [facility]  Pager # [phone] Phone # [phone] [address] [place], [place] [address]

## 2023-08-24 NOTE — Patient Instructions (Addendum)
 Medication Instructions:  No changes  *If you need a refill on your cardiac medications before your next appointment, please call your pharmacy*   Lab Work: Not needed    Testing/Procedures:  Not needed  Follow-Up: At [facility], you and your health needs are our priority.  As part of our continuing mission to provide you with exceptional heart care, we have created designated Provider Care Teams.  These Care Teams include your primary Cardiologist (physician) and Advanced Practice Providers (APPs -  Physician Assistants and Nurse Practitioners) who all work together to provide you with the care you need, when you need it.     Your next appointment:   12 month(s)  The format for your next appointment:   In Person  Provider:   Randene Bustard, MD or Marlana Silvan NP .   Other Instructions

## 2023-08-28 ENCOUNTER — Encounter: Payer: Self-pay | Admitting: Cardiology

## 2023-08-28 NOTE — Assessment & Plan Note (Signed)
 Working on exercise regimen.  Doing well with this.  Hoping to lose more weight.

## 2023-08-28 NOTE — Assessment & Plan Note (Signed)
 Unable to use vasodilators because of longstanding hypotension.  Thankfully, he is breathing much better now and seems to be relatively well-controlled. - Continue nighttime oxygen  use as he is not able to tolerate CPAP -Continue standing dose of torsemide  but monitor for hypotension.

## 2023-08-28 NOTE — Assessment & Plan Note (Signed)
 Seems to be stable from a symptom standpoint.  Mostly related to [facility].  See PAH section.

## 2023-08-28 NOTE — Assessment & Plan Note (Addendum)
 Diastolic pressures were elevated at the time of his heart catheterization in setting of volume overload.  Has not had issues since. -Continue same dose of torsemide  Potassium supplement prevents hypokalemia from diuretics. - Continue potassium supplement.

## 2023-08-28 NOTE — Assessment & Plan Note (Signed)
 Longstanding hypotension.  Managed with TID midodrine .  Unfortunately this limits use of GDMT for PAH. - Continue midodrine  5 mg three times daily. - Ensure adequate hydration.

## 2023-08-28 NOTE — Assessment & Plan Note (Addendum)
 Lipitor for cholesterol management. Compliance noted. Labs not available-managed by [facility] - Continue Lipitor 40 mg.

## 2024-03-12 IMAGING — CR DG CHEST 2V
2 series · 2 of 2 positions shown · non-contrast
Comparison: Chest radiographs done on 09/30/2020 CT chest done on
10/27/2020

CLINICAL DATA: Shortness of breath, hypoxia

EXAM:
CHEST - 2 VIEW

[w chest pa]
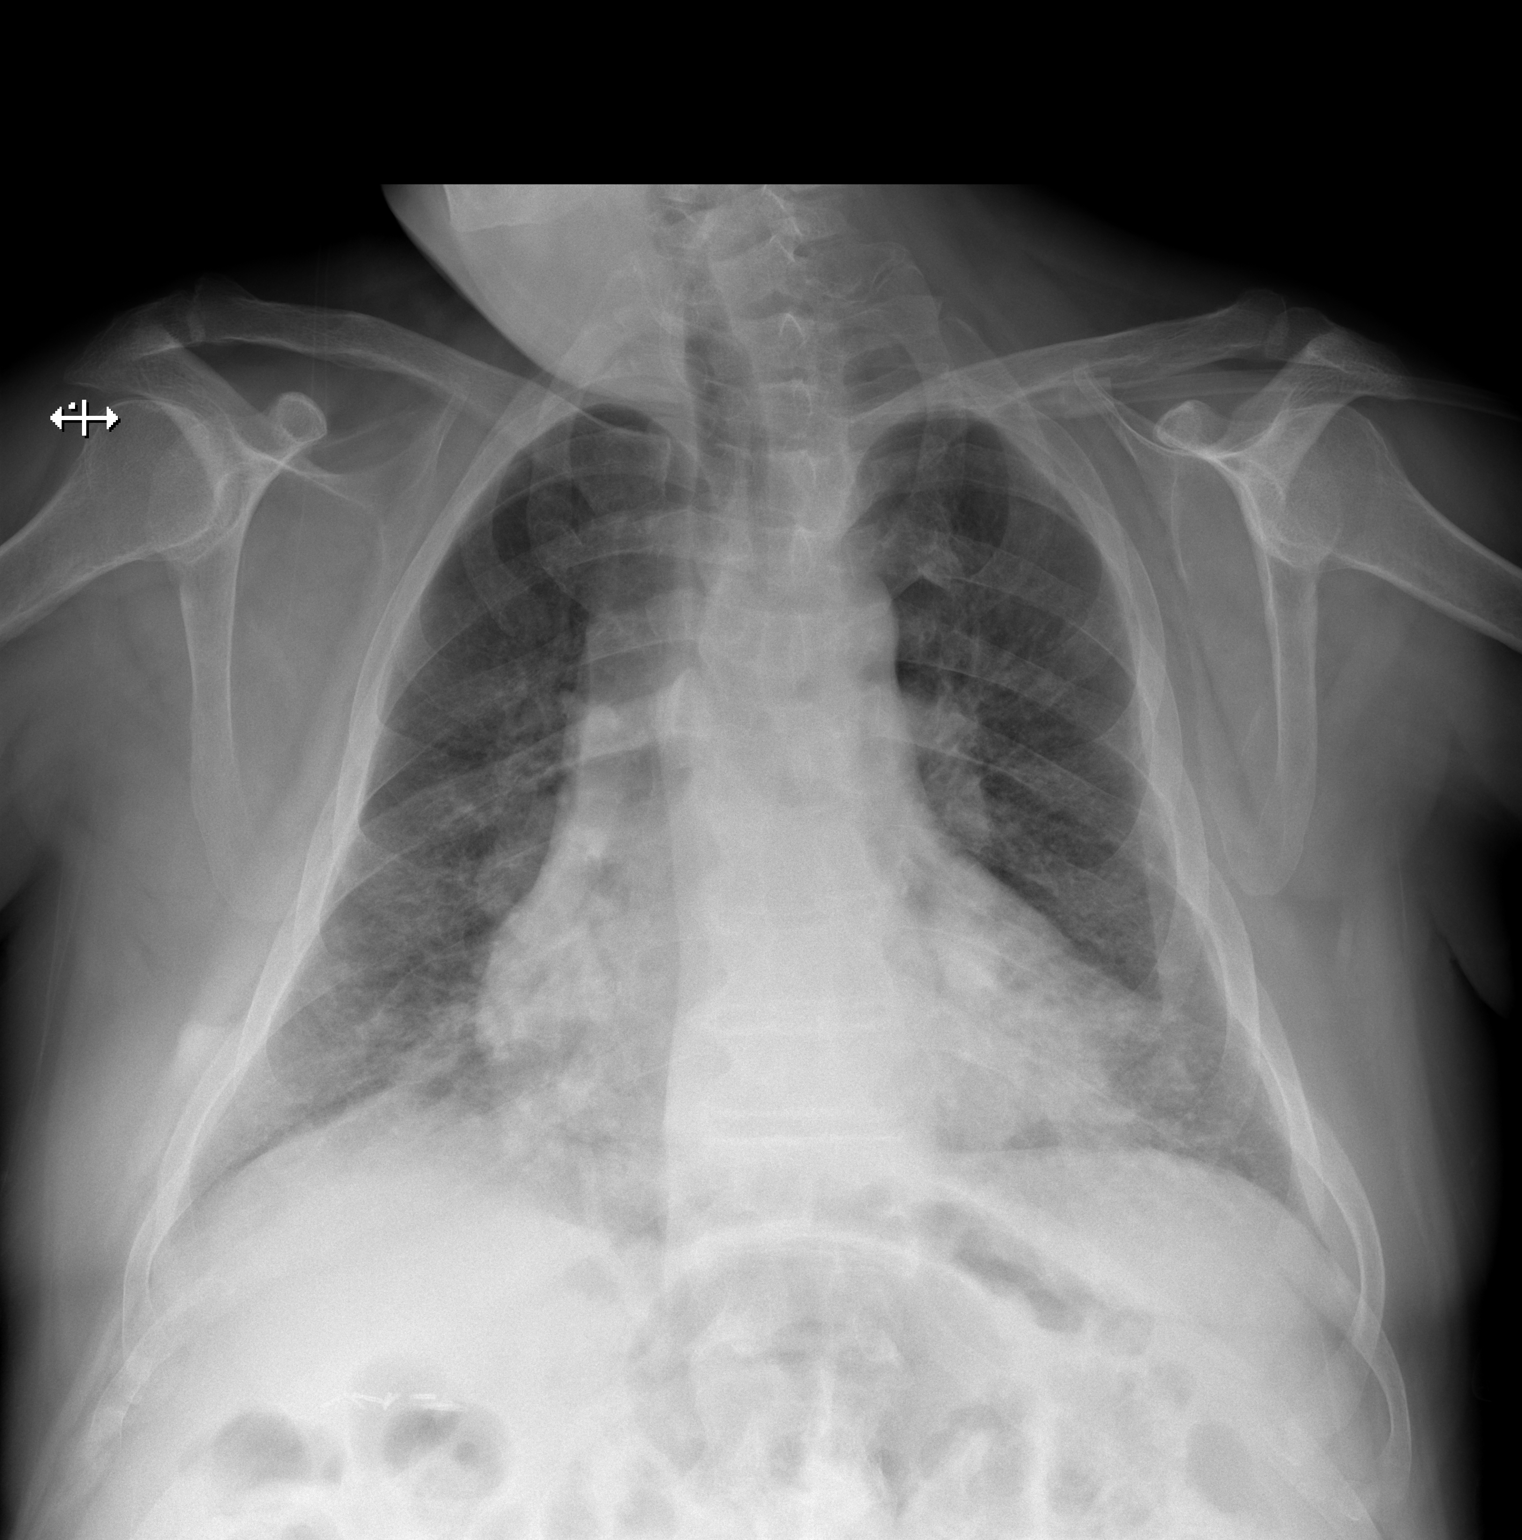

[w chest lat]
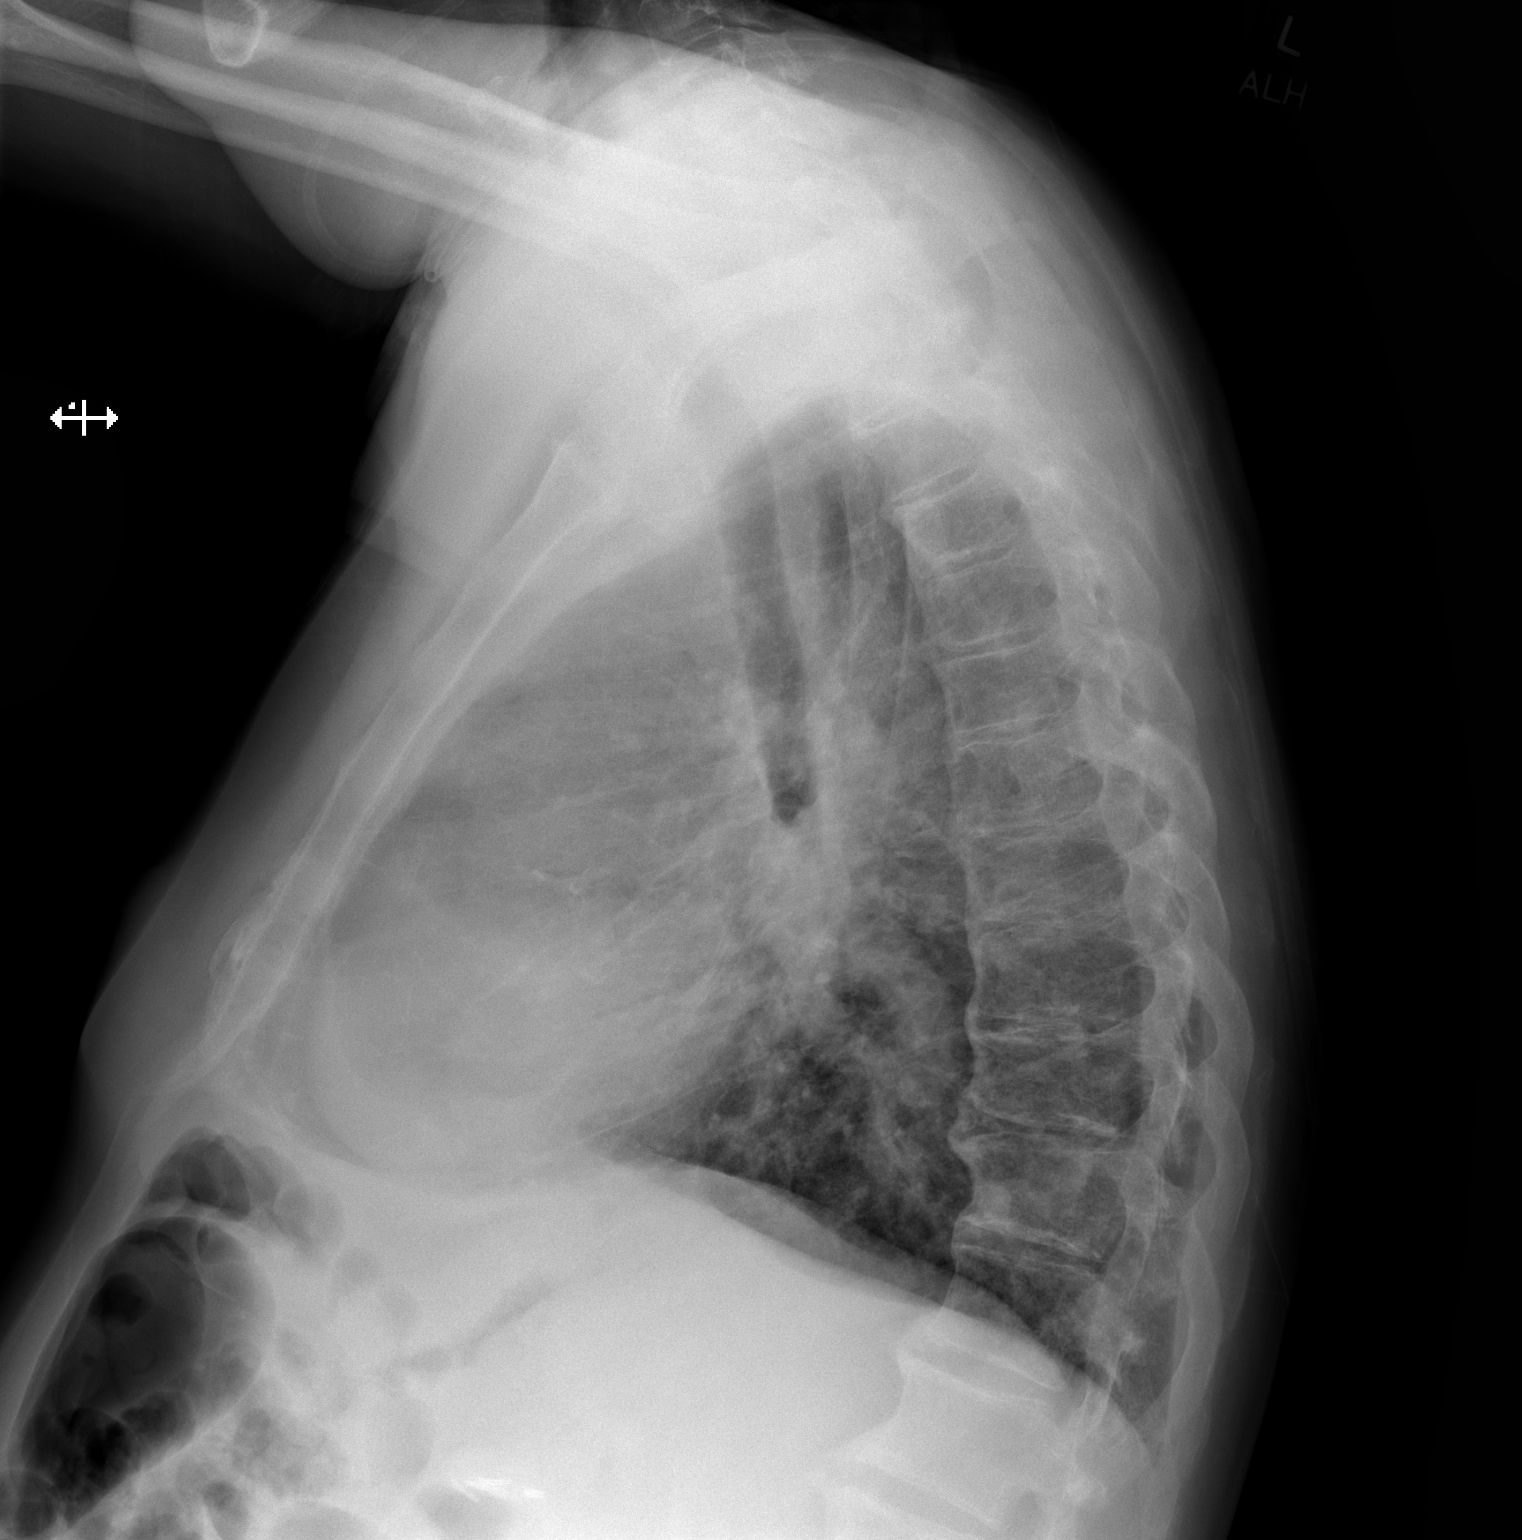

[2 of 2 positions shown; findings below may reference images not displayed]

FINDINGS: Transverse diameter of heart is increased. There are no signs of
alveolar pulmonary edema. Patchy increased interstitial markings are
seen in both lower lung fields more so on the left side. There is
pleural density in the lateral aspect of left lower lung fields
which has not changed significantly and may suggest pleural
thickening or extrapleural fat. Less likely possibility would be
small loculated pleural effusion. There is no pneumothorax.
IMPRESSION: Cardiomegaly. Increased interstitial and patchy alveolar densities
seen in both lower lung fields, more so on the left side suggesting
atelectasis/pneumonia.

## 2024-03-26 ENCOUNTER — Other Ambulatory Visit: Payer: Self-pay | Admitting: Family Medicine

## 2024-03-26 ENCOUNTER — Ambulatory Visit
Admission: RE | Admit: 2024-03-26 | Discharge: 2024-03-26 | Disposition: A | Payer: Medicare (Managed Care) | Source: Ambulatory Visit | Attending: Family Medicine | Admitting: Family Medicine

## 2024-03-26 DIAGNOSIS — R600 Localized edema: Secondary | ICD-10-CM

## 2024-03-26 DIAGNOSIS — R051 Acute cough: Secondary | ICD-10-CM

## 2024-04-04 ENCOUNTER — Inpatient Hospital Stay (HOSPITAL_COMMUNITY)
Admission: EM | Admit: 2024-04-04 | Discharge: 2024-04-12 | DRG: 286 | Disposition: A | Payer: Medicare (Managed Care) | Attending: Internal Medicine | Admitting: Internal Medicine

## 2024-04-04 ENCOUNTER — Other Ambulatory Visit: Payer: Self-pay

## 2024-04-04 ENCOUNTER — Emergency Department (HOSPITAL_COMMUNITY): Payer: Medicare (Managed Care)

## 2024-04-04 DIAGNOSIS — J189 Pneumonia, unspecified organism: Secondary | ICD-10-CM | POA: Diagnosis not present

## 2024-04-04 DIAGNOSIS — J9621 Acute and chronic respiratory failure with hypoxia: Principal | ICD-10-CM | POA: Diagnosis present

## 2024-04-04 DIAGNOSIS — I272 Pulmonary hypertension, unspecified: Secondary | ICD-10-CM | POA: Diagnosis not present

## 2024-04-04 DIAGNOSIS — N179 Acute kidney failure, unspecified: Secondary | ICD-10-CM | POA: Diagnosis present

## 2024-04-04 DIAGNOSIS — N183 Chronic kidney disease, stage 3 unspecified: Secondary | ICD-10-CM | POA: Diagnosis not present

## 2024-04-04 DIAGNOSIS — I50813 Acute on chronic right heart failure: Secondary | ICD-10-CM

## 2024-04-04 DIAGNOSIS — E119 Type 2 diabetes mellitus without complications: Secondary | ICD-10-CM

## 2024-04-04 DIAGNOSIS — I951 Orthostatic hypotension: Secondary | ICD-10-CM | POA: Diagnosis present

## 2024-04-04 DIAGNOSIS — I5032 Chronic diastolic (congestive) heart failure: Secondary | ICD-10-CM | POA: Diagnosis not present

## 2024-04-04 DIAGNOSIS — E669 Obesity, unspecified: Secondary | ICD-10-CM | POA: Diagnosis not present

## 2024-04-04 DIAGNOSIS — J9611 Chronic respiratory failure with hypoxia: Secondary | ICD-10-CM | POA: Diagnosis not present

## 2024-04-04 DIAGNOSIS — I50811 Acute right heart failure: Secondary | ICD-10-CM | POA: Diagnosis present

## 2024-04-04 DIAGNOSIS — E66811 Obesity, class 1: Secondary | ICD-10-CM | POA: Diagnosis not present

## 2024-04-04 DIAGNOSIS — I50810 Right heart failure, unspecified: Secondary | ICD-10-CM | POA: Diagnosis present

## 2024-04-04 DIAGNOSIS — L8990 Pressure ulcer of unspecified site, unspecified stage: Secondary | ICD-10-CM | POA: Insufficient documentation

## 2024-04-04 DIAGNOSIS — I5031 Acute diastolic (congestive) heart failure: Secondary | ICD-10-CM | POA: Diagnosis not present

## 2024-04-04 DIAGNOSIS — N1832 Chronic kidney disease, stage 3b: Secondary | ICD-10-CM

## 2024-04-04 LAB — CBC WITH DIFFERENTIAL/PLATELET
Abs Immature Granulocytes: 0.1 K/uL — ABNORMAL HIGH (ref 0.00–0.07)
Basophils Absolute: 0 K/uL (ref 0.0–0.1)
Basophils Relative: 0 %
Eosinophils Absolute: 0.3 K/uL (ref 0.0–0.5)
Eosinophils Relative: 3 %
HCT: 57.7 % — ABNORMAL HIGH (ref 39.0–52.0)
Hemoglobin: 19.5 g/dL — ABNORMAL HIGH (ref 13.0–17.0)
Immature Granulocytes: 1 %
Lymphocytes Relative: 7 %
Lymphs Abs: 0.8 K/uL (ref 0.7–4.0)
MCH: 33.9 pg (ref 26.0–34.0)
MCHC: 33.8 g/dL (ref 30.0–36.0)
MCV: 100.2 fL — ABNORMAL HIGH (ref 80.0–100.0)
Monocytes Absolute: 0.5 K/uL (ref 0.1–1.0)
Monocytes Relative: 5 %
Neutro Abs: 10.1 K/uL — ABNORMAL HIGH (ref 1.7–7.7)
Neutrophils Relative %: 84 %
Platelets: 133 K/uL — ABNORMAL LOW (ref 150–400)
RBC: 5.76 MIL/uL (ref 4.22–5.81)
RDW: 16.6 % — ABNORMAL HIGH (ref 11.5–15.5)
WBC: 11.8 K/uL — ABNORMAL HIGH (ref 4.0–10.5)
nRBC: 0 % (ref 0.0–0.2)

## 2024-04-04 LAB — URINALYSIS, W/ REFLEX TO CULTURE (INFECTION SUSPECTED)
Bacteria, UA: NONE SEEN
Bilirubin Urine: NEGATIVE
Glucose, UA: NEGATIVE mg/dL
Hgb urine dipstick: NEGATIVE
Ketones, ur: NEGATIVE mg/dL
Leukocytes,Ua: NEGATIVE
Nitrite: NEGATIVE
Protein, ur: 30 mg/dL — AB
Specific Gravity, Urine: 1.008 (ref 1.005–1.030)
pH: 5 (ref 5.0–8.0)

## 2024-04-04 LAB — COMPREHENSIVE METABOLIC PANEL WITH GFR
ALT: 50 U/L — ABNORMAL HIGH (ref 0–44)
AST: 30 U/L (ref 15–41)
Albumin: 2.7 g/dL — ABNORMAL LOW (ref 3.5–5.0)
Alkaline Phosphatase: 92 U/L (ref 38–126)
Anion gap: 8 (ref 5–15)
BUN: 47 mg/dL — ABNORMAL HIGH (ref 8–23)
CO2: 28 mmol/L (ref 22–32)
Calcium: 7.8 mg/dL — ABNORMAL LOW (ref 8.9–10.3)
Chloride: 98 mmol/L (ref 98–111)
Creatinine, Ser: 2.13 mg/dL — ABNORMAL HIGH (ref 0.61–1.24)
GFR, Estimated: 34 mL/min — ABNORMAL LOW (ref >60)
Glucose, Bld: 253 mg/dL — ABNORMAL HIGH (ref 70–99)
Potassium: 3.7 mmol/L (ref 3.5–5.1)
Sodium: 134 mmol/L — ABNORMAL LOW (ref 135–145)
Total Bilirubin: 1.3 mg/dL — ABNORMAL HIGH (ref 0.0–1.2)
Total Protein: 5.3 g/dL — ABNORMAL LOW (ref 6.5–8.1)

## 2024-04-04 LAB — I-STAT VENOUS BLOOD GAS, ED
Acid-Base Excess: 3 mmol/L — ABNORMAL HIGH (ref 0.0–2.0)
Bicarbonate: 31.7 mmol/L — ABNORMAL HIGH (ref 20.0–28.0)
Calcium, Ion: 0.98 mmol/L — ABNORMAL LOW (ref 1.15–1.40)
HCT: 59 % — ABNORMAL HIGH (ref 39.0–52.0)
Hemoglobin: 20.1 g/dL — ABNORMAL HIGH (ref 13.0–17.0)
O2 Saturation: 79 %
Potassium: 5 mmol/L (ref 3.5–5.1)
Sodium: 137 mmol/L (ref 135–145)
TCO2: 33 mmol/L — ABNORMAL HIGH (ref 22–32)
pCO2, Ven: 59.3 mmHg (ref 44–60)
pH, Ven: 7.336 (ref 7.25–7.43)
pO2, Ven: 47 mmHg — ABNORMAL HIGH (ref 32–45)

## 2024-04-04 LAB — PROTIME-INR
INR: 1.2 (ref 0.8–1.2)
Prothrombin Time: 15.9 s — ABNORMAL HIGH (ref 11.4–15.2)

## 2024-04-04 LAB — RESP PANEL BY RT-PCR (RSV, FLU A&B, COVID)  RVPGX2
Influenza A by PCR: NEGATIVE
Influenza B by PCR: NEGATIVE
Resp Syncytial Virus by PCR: NEGATIVE
SARS Coronavirus 2 by RT PCR: NEGATIVE

## 2024-04-04 LAB — TROPONIN I (HIGH SENSITIVITY)
Troponin I (High Sensitivity): 31 ng/L — ABNORMAL HIGH (ref <18)
Troponin I (High Sensitivity): 31 ng/L — ABNORMAL HIGH (ref <18)

## 2024-04-04 LAB — PROCALCITONIN: Procalcitonin: 0.12 ng/mL

## 2024-04-04 LAB — I-STAT CG4 LACTIC ACID, ED: Lactic Acid, Venous: 1.2 mmol/L (ref 0.5–1.9)

## 2024-04-04 LAB — BRAIN NATRIURETIC PEPTIDE: B Natriuretic Peptide: 1097.5 pg/mL — ABNORMAL HIGH (ref 0.0–100.0)

## 2024-04-04 MED ORDER — SODIUM CHLORIDE 0.9 % IV SOLN
500.0000 mg | Freq: Once | INTRAVENOUS | Status: AC
  Administered 2024-04-04: 500 mg via INTRAVENOUS
  Filled 2024-04-04: qty 5

## 2024-04-04 MED ORDER — FUROSEMIDE 10 MG/ML IJ SOLN
40.0000 mg | Freq: Once | INTRAMUSCULAR | Status: AC
  Administered 2024-04-04: 40 mg via INTRAVENOUS
  Filled 2024-04-04: qty 4

## 2024-04-04 MED ORDER — SODIUM CHLORIDE 0.9 % IV SOLN
2.0000 g | Freq: Once | INTRAVENOUS | Status: AC
  Administered 2024-04-04: 2 g via INTRAVENOUS
  Filled 2024-04-04: qty 20

## 2024-04-04 NOTE — Subjective & Objective (Signed)
 Pt with known developmental delays supposed to be on O@ but has not been using it. Was seen by EMS hypoxic down to 83% on 3L started on 12L NRB has been having a cough family also endorses lower extremity edema Cough and has been ongoing for the past week productive of some sputum He is started on antibiotics Has been having trouble laying flat Was given prescription for Lasix  but not improving Patient also has history of pulmonary hypertension And known history of postural chronic hypotension for which she takes midodrine  He has also known right ventricular failure related to his pulmonary hypertension

## 2024-04-04 NOTE — ED Provider Notes (Signed)
 [place] EMERGENCY DEPARTMENT AT [facility] Provider Note   CSN: [id number] Arrival date & time: 04/04/24  1534     Patient presents with: Shortness of Breath   Tony Jones is a 63 y.o. male.   63 yo M with a cc of difficulty breathing.  Patient is developmentally delayed and has trouble providing much history.  Per EMS report picked up with hypoxia.  Known to be noncompliant with his oxygen  is supposed to be on 3 L at all times.  Usually would not want anything put on his face but did tolerate nonrebreather mask and route with improvement.  Coughing for about a week.  Family has arrived and provides further history.  Coughing for about a [facility] and started on antibiotics for pneumonia.  Coughing trouble breathing has trouble lying flat.  Trouble sleeping at night.  Significant swelling to his lower extremities.  Started on Lasix .  Felt not to be improving and was sent here for evaluation.  Sounds like symptoms are improved when he sits up.   Shortness of Breath      Prior to Admission medications   Medication Sig Start Date End Date Taking? Authorizing Provider  acetaminophen  (TYLENOL ) 650 MG CR tablet Take 650 mg by mouth every 8 (eight) hours as needed for pain.   Yes [provider]  allopurinol  (ZYLOPRIM ) 300 MG tablet Take 300 mg by mouth daily.  01/18/15  Yes [provider]  amoxicillin-clavulanate (AUGMENTIN) 875-125 MG tablet Take 1 tablet by mouth 2 (two) times daily.   Yes [provider]  atorvastatin  (LIPITOR) 40 MG tablet Take 1 tablet (40 mg total) by mouth daily at 6 PM. 10/13/17  Yes Tillery, Ozell Barter, PA-C  citalopram  (CELEXA ) 20 MG tablet Take 20 mg by mouth daily.   Yes [provider]  dextromethorphan-guaiFENesin (ROBITUSSIN-DM) 10-100 MG/5ML liquid Take 10 mLs by mouth every 4 (four) hours as needed for cough.   Yes [provider]  diazepam (VALIUM) 2 MG tablet Take 2 mg by mouth as  directed.   Yes [provider]  furosemide  (LASIX ) 20 MG tablet Take 20 mg by mouth 2 (two) times daily.   Yes [provider]  ipratropium-albuterol  (DUONEB) 0.5-2.5 (3) MG/3ML SOLN Take 3 mLs by nebulization every 6 (six) hours as needed.   Yes [provider]  Iron-Vitamin C (VITRON-C) 65-125 MG TABS Take 1 tablet by mouth daily.   Yes [provider]  levocetirizine (XYZAL ) 5 MG tablet Take 5 mg by mouth every other day.   Yes [provider]  midodrine  (PROAMATINE ) 5 MG tablet Take 1 tablet (5 mg total) by mouth See admin instructions. Take 1 tablet (5 mg) by mouth 3 times daily, except take an additional tablet (5 mg) by mouth at midday on center days to maintain adequate blood pressure. Patient taking differently: Take 5 mg by mouth 3 (three) times daily. 08/27/19  Yes Anner Alm ORN, MD  Multiple Vitamin (MULTIVITAMIN WITH MINERALS) TABS tablet Take 1 tablet by mouth daily with breakfast.   Yes [provider]  pantoprazole  (PROTONIX ) 40 MG tablet Take 40 mg by mouth 2 (two) times daily with a meal.    Yes [provider]  potassium chloride  SA (K-DUR,KLOR-CON ) 20 MEQ tablet Take 1 tablet (20 mEq total) by mouth daily. 10/14/17  Yes Lesia Ozell Barter, PA-C  senna-docusate (SENOKOT-S) 8.6-50 MG tablet Take 2 tablets by mouth at bedtime.   Yes [provider]  desoximetasone (TOPICORT) 0.25 % cream Apply 1 Application topically 2 (two) times daily as needed (eczema). Patient not taking: Reported on 08/24/2023    [provider]  docusate sodium  (COLACE) 100 MG capsule Take 100 mg by mouth daily as needed for mild constipation.  Patient not taking: Reported on 08/24/2023    [provider]  Emollient ROSSIE ADVANCED THERAPY) CREA Apply 1 Application topically 2 (two) times daily as needed (For eczema). Patient not taking: Reported on 08/24/2023    [provider]  hydrOXYzine (ATARAX) 10  MG tablet Take 10 mg by mouth every 8 (eight) hours as needed for itching. Patient not taking: Reported on 08/24/2023    [provider]  LORazepam  (ATIVAN ) 0.5 MG tablet Take 0.5 mg by mouth 2 (two) times daily as needed for anxiety. Patient not taking: Reported on 08/24/2023    [provider]  Menthol, Topical Analgesic, (BIOFREEZE PROFESSIONAL) 5 % GEL Apply 1 application  topically every 4 (four) hours as needed (pain). Patient not taking: Reported on 08/24/2023    [provider]  ondansetron  (ZOFRAN ) 4 MG tablet Take 4 mg by mouth every 8 (eight) hours as needed for nausea or vomiting. Patient not taking: Reported on 08/24/2023    [provider]  torsemide  (DEMADEX ) 20 MG tablet Take 20 mg by mouth 2 (two) times daily.    [provider]    Allergies: Metformin  and related and Codeine    Review of Systems  Respiratory:  Positive for shortness of breath.     Updated Vital Signs BP 107/77   Pulse 86   Temp 97.6 F (36.4 C) (Axillary)   Resp 18   Ht 5' 1 (1.549 m)   Wt 86 kg   SpO2 96%   BMI 35.82 kg/m   Physical Exam Vitals and nursing note reviewed.  Constitutional:      Appearance: He is well-developed.  HENT:     Head: Normocephalic and atraumatic.  Eyes:     Pupils: Pupils are equal, round, and reactive to light.  Neck:     Vascular: No JVD.  Cardiovascular:     Rate and Rhythm: Normal rate and regular rhythm.     Heart sounds: No murmur heard.    No friction rub. No gallop.  Pulmonary:     Effort: No respiratory distress.     Breath sounds: No wheezing.  Abdominal:     General: There is no distension.     Tenderness: There is no abdominal tenderness. There is no guarding or rebound.  Musculoskeletal:        General: Normal range of motion.     Cervical back: Normal range of motion and neck supple.     Right lower leg: Edema present.     Left lower leg: Edema present.     Comments: 2+ lower extremity edema up to  the shins  Skin:    Coloration: Skin is not pale.     Findings: No rash.  Neurological:     Mental Status: He is alert and oriented to person, place, and time.  Psychiatric:        Behavior: Behavior normal.     (all labs ordered are listed, but only abnormal results are displayed) Labs Reviewed  COMPREHENSIVE METABOLIC PANEL WITH GFR - Abnormal; Notable for the following components:      Result Value   Sodium 134 (*)    Glucose, Bld 253 (*)    BUN 47 (*)  Creatinine, Ser 2.13 (*)    Calcium  7.8 (*)    Total Protein 5.3 (*)    Albumin  2.7 (*)    ALT 50 (*)    Total Bilirubin 1.3 (*)    GFR, Estimated 34 (*)    All other components within normal limits  CBC WITH DIFFERENTIAL/PLATELET - Abnormal; Notable for the following components:   WBC 11.8 (*)    Hemoglobin 19.5 (*)    HCT 57.7 (*)    MCV 100.2 (*)    RDW 16.6 (*)    Platelets 133 (*)    Neutro Abs 10.1 (*)    Abs Immature Granulocytes 0.10 (*)    All other components within normal limits  URINALYSIS, W/ REFLEX TO CULTURE (INFECTION SUSPECTED) - Abnormal; Notable for the following components:   Protein, ur 30 (*)    All other components within normal limits  BRAIN NATRIURETIC PEPTIDE - Abnormal; Notable for the following components:   B Natriuretic Peptide 1,097.5 (*)    All other components within normal limits  PROTIME-INR - Abnormal; Notable for the following components:   Prothrombin Time 15.9 (*)    All other components within normal limits  I-STAT VENOUS BLOOD GAS, ED - Abnormal; Notable for the following components:   pO2, Ven 47 (*)    Bicarbonate 31.7 (*)    TCO2 33 (*)    Acid-Base Excess 3.0 (*)    Calcium , Ion 0.98 (*)    HCT 59.0 (*)    Hemoglobin 20.1 (*)    All other components within normal limits  TROPONIN I (HIGH SENSITIVITY) - Abnormal; Notable for the following components:   Troponin I (High Sensitivity) 31 (*)    All other components within normal limits  RESP PANEL BY RT-PCR (RSV,  FLU A&B, COVID)  RVPGX2  CULTURE, BLOOD (ROUTINE X 2)  CULTURE, BLOOD (ROUTINE X 2)  PROCALCITONIN  I-STAT CG4 LACTIC ACID, ED  I-STAT CG4 LACTIC ACID, ED  TROPONIN I (HIGH SENSITIVITY)  TROPONIN I (HIGH SENSITIVITY)    EKG: EKG Interpretation Date/Time:  Wednesday April 04 2024 16:18:26 EST Ventricular Rate:  82 PR Interval:  157 QRS Duration:  100 QT Interval:  413 QTC Calculation: 483 R Axis:   107  Text Interpretation: Sinus rhythm Probable left atrial enlargement Probable RVH w/ secondary repol abnormality Borderline prolonged QT interval inverted t waves in II, III, aVF and v3,4,5 seen on prior No significant change since last tracing Confirmed by Emil Share [phone]) on 04/04/2024 4:41:00 PM  Radiology: [facility] Chest Port 1 View Result Date: 04/04/2024 EXAM: 1 AP VIEW XRAY OF THE CHEST 04/04/2024 04:27:00 PM COMPARISON: 03/26/2024. CLINICAL HISTORY: Questionable sepsis - evaluate for abnormality. FINDINGS: LUNGS AND PLEURA: Interstitial prominence may reflect early interstitial edema. No pleural effusion. No pneumothorax. HEART AND MEDIASTINUM: Cardiomegaly, vascular congestion. BONES AND SOFT TISSUES: No acute osseous abnormality. IMPRESSION: 1. Cardiomegaly with vascular congestion. 2. Interstitial prominence concerning for early interstitial edema. Electronically signed by: Franky Crease MD 04/04/2024 05:12 PM EST RP Workstation: [id number]     .Critical Care  Performed by: Emil Share, DO Authorized by: Emil Share, DO   Critical care provider statement:    Critical care time (minutes):  35   Critical care time was exclusive of:  Separately billable procedures and treating other patients   Critical care was time spent personally by me on the following activities:  Development of treatment plan with patient or surrogate, discussions with consultants, evaluation of patient's response to treatment, examination of  patient, ordering and review of laboratory studies, ordering and  review of radiographic studies, ordering and performing treatments and interventions, pulse oximetry, re-evaluation of patient's condition and review of old charts   Care discussed with: admitting provider      Medications Ordered in the ED  cefTRIAXone (ROCEPHIN) 2 g in sodium chloride  0.9 % 100 mL IVPB (0 g Intravenous Stopped 04/04/24 1855)  azithromycin (ZITHROMAX) 500 mg in sodium chloride  0.9 % 250 mL IVPB (0 mg Intravenous Stopped 04/04/24 2002)  furosemide  (LASIX ) injection 40 mg (40 mg Intravenous Given 04/04/24 1812)                                    Medical Decision Making Amount and/or Complexity of Data Reviewed Labs: ordered. Radiology: ordered.  Risk Prescription drug management. Decision regarding hospitalization.   63 yo M with a chief complaints of cough congestion lower extremity edema going on for about a week.  On 3 L of oxygen  at baseline known to be noncompliant.  O2 sats in the low 80s which per EMS is close to his baseline.  Family feels like he has been getting much worse over the course of the week.  Has been on Augmentin and Lasix  as an outpatient.  Feels that he would benefit from staying in the hospital.  Will obtain chest x-ray laboratory evaluation reassess.  CXR with possible viral-like picture on my independent interpretation.  Radiology read with more likely edema.  With history more consistent with pneumonia cough congestion going on for about a week I will start on community-acquired pneumonia antibiotics but with edema BNP elevation and lower extremity edema will give a dose of Lasix  here.  Will discuss with medicine.  The patients results and plan were reviewed and discussed.   Any x-rays performed were independently reviewed by myself.   Differential diagnosis were considered with the presenting HPI.  Medications  cefTRIAXone (ROCEPHIN) 2 g in sodium chloride  0.9 % 100 mL IVPB (0 g Intravenous Stopped 04/04/24 1855)  azithromycin (ZITHROMAX)  500 mg in sodium chloride  0.9 % 250 mL IVPB (0 mg Intravenous Stopped 04/04/24 2002)  furosemide  (LASIX ) injection 40 mg (40 mg Intravenous Given 04/04/24 1812)    Vitals:   04/04/24 2130 04/04/24 2145 04/04/24 2200 04/04/24 2237  BP: 102/78 105/81 107/77   Pulse: 86 88 86   Resp: 18 15 18    Temp:    97.6 F (36.4 C)  TempSrc:    Axillary  SpO2: 92% 95% 96%   Weight:      Height:        Final diagnoses:  Acute on chronic respiratory failure with hypoxia (HCC)    Admission/ observation were discussed with the admitting physician, patient and/or family and they are comfortable with the plan.       Final diagnoses:  Acute on chronic respiratory failure with hypoxia [facility])    ED Discharge Orders     None          Emil Share, DO 04/04/24 2247

## 2024-04-04 NOTE — ED Notes (Signed)
 Pt given sandwich bag and drink, okay per MD

## 2024-04-04 NOTE — TOC CM/SW Note (Signed)
 TOC consult received for d/c planning needs, possible SNF vs. HH. Unit CM to f/u with patient as appropriate.   Per notes, patient is on service with [facility] and has home O2. It is unclear is oxygen  use is continuous or q hs and PRN.   Merilee Batty, MSN, RN Case Management [phone]

## 2024-04-04 NOTE — H&P (Signed)
 PAT SIRES FMW:[id number] DOB: [birth year] DOA: 04/04/2024     PCP: [name] Annabella CROME, DO   Outpatient Specialists:  CARDS:  Dr. Alm Clay, MD    Patient arrived to ER on 04/04/24 at 1534 Referred by Attending Emil Share, DO   Patient coming from:    home Lives  With family    Chief Complaint:   Chief Complaint  Patient presents with   Shortness of Breath    HPI: Tony Jones is a 63 y.o. male with medical history significant of right heart failure, chronic diastolic CHF, postural hypotension, pulmonary hypertension dyslipidemia morbid obesity, developmental delay    Presented with   cough shortness of breath increased lower extremity edema Pt with known developmental delays supposed to be on O@ but has not been using it. Was seen by EMS hypoxic down to 83% on 3L started on 12L NRB has been having a cough family also endorses lower extremity edema Cough and has been ongoing for the past week productive of some sputum He is started on antibiotics Has been having trouble laying flat Was given prescription for Lasix  but not improving Patient also has history of pulmonary hypertension And known history of postural chronic hypotension for which she takes midodrine  He has also known right ventricular failure related to his pulmonary hypertension    He has been coughing for 1 week but has hard time spiting up.  At baseline he can communicate his needs, able to do ADL on the level of 63 yo  No fever no chills    Denies significant ETOH intake   Does not smoke     Regarding pertinent Chronic problems:    Hyperlipidemia - on statins Lipitor (atorvastatin )  Lipid Panel     Component Value Date/Time   CHOL 175 07/04/2018 0910   TRIG 324 (H) 07/04/2018 0910   HDL 31 (L) 07/04/2018 0910   CHOLHDL 5.6 07/04/2018 0910   VLDL 65 (H) 07/04/2018 0910   LDLCALC 79 07/04/2018 0910    Postural hypotension on midodrine    chronic CHF diastolic/   history of pulmonary  hypertension followed by cardiology       obesity-   BMI Readings from Last 1 Encounters:  04/04/24 35.82 kg/m        CKD stage IIIb  baseline Cr 1.9 Estimated Creatinine Clearance: 33.5 mL/min (A) (by C-G formula based on SCr of 2.13 mg/dL (H)).  Lab Results  Component Value Date   CREATININE 2.13 (H) 04/04/2024   CREATININE 1.61 (H) 07/13/2022   CREATININE 1.96 (H) 03/03/2020   Lab Results  Component Value Date   NA 137 04/04/2024   CL 98 04/04/2024   K 5.0 04/04/2024   CO2 28 04/04/2024   BUN 47 (H) 04/04/2024   CREATININE 2.13 (H) 04/04/2024   GFRNONAA 34 (L) 04/04/2024   CALCIUM  7.8 (L) 04/04/2024   ALBUMIN  2.7 (L) 04/04/2024   GLUCOSE 253 (H) 04/04/2024        While in ER:         Lab Orders         Blood Culture (routine x 2)         Resp panel by RT-PCR (RSV, Flu A&B, Covid) Anterior Nasal Swab         Comprehensive metabolic panel         CBC with Differential         Urinalysis, w/ Reflex to Culture (Infection Suspected) -Urine, Clean Catch  Brain natriuretic peptide         Protime-INR         Procalcitonin         I-Stat Lactic Acid, ED         I-Stat venous blood gas, ED        CXR - . Cardiomegaly with vascular congestion.  Interstitial prominence concerning for early interstitial edema.     Following Medications were ordered in ER: Medications  cefTRIAXone  (ROCEPHIN ) 2 g in sodium chloride  0.9 % 100 mL IVPB (0 g Intravenous Stopped 04/04/24 1855)  azithromycin  (ZITHROMAX ) 500 mg in sodium chloride  0.9 % 250 mL IVPB (500 mg Intravenous New Bag/Given 04/04/24 1850)  furosemide  (LASIX ) injection 40 mg (40 mg Intravenous Given 04/04/24 1812)       ED Triage Vitals  Encounter Vitals Group     BP 04/04/24 1546 120/82     Girls Systolic BP Percentile --      Girls Diastolic BP Percentile --      Boys Systolic BP Percentile --      Boys Diastolic BP Percentile --      Pulse Rate 04/04/24 1546 82     Resp 04/04/24 1546 19     Temp  04/04/24 1546 (!) 97.4 F (36.3 C)     Temp Source 04/04/24 1546 Axillary     SpO2 04/04/24 1546 100 %     Weight 04/04/24 1548 189 lb 9.5 oz (86 kg)     Height 04/04/24 1548 5' 1 (1.549 m)     Head Circumference --      Peak Flow --      Pain Score 04/04/24 1547 0     Pain Loc --      Pain Education --      Exclude from Growth Chart --   UFJK(75)@     _________________________________________ Significant initial  Findings: Abnormal Labs Reviewed  COMPREHENSIVE METABOLIC PANEL WITH GFR - Abnormal; Notable for the following components:      Result Value   Sodium 134 (*)    Glucose, Bld 253 (*)    BUN 47 (*)    Creatinine, Ser 2.13 (*)    Calcium  7.8 (*)    Total Protein 5.3 (*)    Albumin  2.7 (*)    ALT 50 (*)    Total Bilirubin 1.3 (*)    GFR, Estimated 34 (*)    All other components within normal limits  CBC WITH DIFFERENTIAL/PLATELET - Abnormal; Notable for the following components:   WBC 11.8 (*)    Hemoglobin 19.5 (*)    HCT 57.7 (*)    MCV 100.2 (*)    RDW 16.6 (*)    Platelets 133 (*)    Neutro Abs 10.1 (*)    Abs Immature Granulocytes 0.10 (*)    All other components within normal limits  BRAIN NATRIURETIC PEPTIDE - Abnormal; Notable for the following components:   B Natriuretic Peptide 1,097.5 (*)    All other components within normal limits  PROTIME-INR - Abnormal; Notable for the following components:   Prothrombin Time 15.9 (*)    All other components within normal limits  I-STAT VENOUS BLOOD GAS, ED - Abnormal; Notable for the following components:   pO2, Ven 47 (*)    Bicarbonate 31.7 (*)    TCO2 33 (*)    Acid-Base Excess 3.0 (*)    Calcium , Ion 0.98 (*)    HCT 59.0 (*)    Hemoglobin 20.1 (*)  All other components within normal limits  TROPONIN I (HIGH SENSITIVITY) - Abnormal; Notable for the following components:   Troponin I (High Sensitivity) 31 (*)    All other components within normal limits      _________________________ Troponin   ordered Cardiac Panel (last 3 results) Recent Labs    04/04/24 1609  TROPONINIHS 31*     ECG: Ordered Personally reviewed and interpreted by me showing: HR : 82 Rhythm:Sinus rhythm Probable left atrial enlargement Probable RVH w/ secondary repol abnormality Borderline prolonged QT interval QTC 483  BNP (last 3 results) Recent Labs    04/04/24 1609  BNP 1,097.5*    The recent clinical data is shown below. Vitals:   04/04/24 1556 04/04/24 1600 04/04/24 1715 04/04/24 1915  BP:  111/81 103/74 103/78  Pulse:  84 84 87  Resp:  14 16 13   Temp:      TempSrc:      SpO2: 98% 92% 97% 93%  Weight:      Height:          WBC     Component Value Date/Time   WBC 11.8 (H) 04/04/2024 1609   LYMPHSABS 0.8 04/04/2024 1609   MONOABS 0.5 04/04/2024 1609   EOSABS 0.3 04/04/2024 1609   BASOSABS 0.0 04/04/2024 1609     Lactic Acid, Venous    Component Value Date/Time   LATICACIDVEN 1.2 04/04/2024 1934     Procalcitonin 0.12      UA   no evidence of UTI     Urine analysis:    Component Value Date/Time   COLORURINE YELLOW 03/03/2020 1755   APPEARANCEUR CLEAR 03/03/2020 1755   LABSPEC 1.009 03/03/2020 1755   PHURINE 5.0 03/03/2020 1755   GLUCOSEU NEGATIVE 03/03/2020 1755   HGBUR NEGATIVE 03/03/2020 1755   BILIRUBINUR NEGATIVE 03/03/2020 1755   KETONESUR NEGATIVE 03/03/2020 1755   PROTEINUR NEGATIVE 03/03/2020 1755   UROBILINOGEN 0.2 01/29/2015 1716   NITRITE NEGATIVE 03/03/2020 1755   LEUKOCYTESUR NEGATIVE 03/03/2020 1755    Results for orders placed or performed during the hospital encounter of 04/04/24  Resp panel by RT-PCR (RSV, Flu A&B, Covid)     Status: None   Collection Time: 04/04/24  4:09 PM   Specimen: Nasal Swab  Result Value Ref Range Status   SARS Coronavirus 2 by RT PCR NEGATIVE NEGATIVE Final   Influenza A by PCR NEGATIVE NEGATIVE Final   Influenza B by PCR NEGATIVE NEGATIVE Final         Resp Syncytial Virus by PCR NEGATIVE NEGATIVE Final           ABX started Antibiotics Given (last 72 hours)     Date/Time Action Medication Dose Rate   04/04/24 1814 New Bag/Given   cefTRIAXone (ROCEPHIN) 2 g in sodium chloride  0.9 % 100 mL IVPB 2 g 200 mL/hr   04/04/24 1850 New Bag/Given   azithromycin (ZITHROMAX) 500 mg in sodium chloride  0.9 % 250 mL IVPB 500 mg 250 mL/hr      _______________________________________________________  Venous  Blood Gas result:  pH  7.336 Sodium 137 mmol/L   pCO2, Ven 59.3 mmHg Potassium 5.0 mmol/L  pO2, Ven 47 High  mmHg      ABG    Component Value Date/Time   PHART 7.368 01/24/2019 1628   PCO2ART 50.1 (H) 01/24/2019 1628   PO2ART 87.0 01/24/2019 1628   HCO3 31.7 (H) 04/04/2024 1934   TCO2 33 (H) 04/04/2024 1934   O2SAT 79 04/04/2024 1934  __________________________________________________________ Recent Labs  Lab 04/04/24 1609 04/04/24 1934  NA 134* 137  K 3.7 5.0  CO2 28  --   GLUCOSE 253*  --   BUN 47*  --   CREATININE 2.13*  --   CALCIUM  7.8*  --     Cr  Up from baseline see below Lab Results  Component Value Date   CREATININE 2.13 (H) 04/04/2024   CREATININE 1.61 (H) 07/13/2022   CREATININE 1.96 (H) 03/03/2020    Recent Labs  Lab 04/04/24 1609  AST 30  ALT 50*  ALKPHOS 92  BILITOT 1.3*  PROT 5.3*  ALBUMIN  2.7*   Lab Results  Component Value Date   CALCIUM  7.8 (L) 04/04/2024    Plt: Lab Results  Component Value Date   PLT 133 (L) 04/04/2024       Recent Labs  Lab 04/04/24 1609 04/04/24 1934  WBC 11.8*  --   NEUTROABS 10.1*  --   HGB 19.5* 20.1*  HCT 57.7* 59.0*  MCV 100.2*  --   PLT 133*  --     HG/HCT  Up from baseline see below    Component Value Date/Time   HGB 20.1 (H) 04/04/2024 1934   HGB 8.6 (L) 09/19/2017 1214   HCT 59.0 (H) 04/04/2024 1934   HCT 30.9 (L) 09/19/2017 1214   MCV 100.2 (H) 04/04/2024 1609   MCV 72 (L) 09/19/2017 1214     _______________________________________________ Hospitalist was called for admission for  acute on chronic respiratory failure   The following Work up has been ordered so far:  Orders Placed This Encounter  Procedures   Critical Care   Blood Culture (routine x 2)   Resp panel by RT-PCR (RSV, Flu A&B, Covid) Anterior Nasal Swab   DG Chest Port 1 View   Comprehensive metabolic panel   CBC with Differential   Urinalysis, w/ Reflex to Culture (Infection Suspected) -Urine, Clean Catch   Brain natriuretic peptide   Protime-INR   Procalcitonin   Document height and weight   Assess and Document Glasgow Coma Scale   Document vital signs within 1-hour of fluid bolus completion.  Notify provider of abnormal vital signs despite fluid resuscitation.   Refer to Sidebar Report: Sepsis Bundle ED/IP   Notify provider for difficulties obtaining IV access   Initiate Carrier Fluid Protocol   Consult for Unassigned Medical Admission   I-Stat Lactic Acid, ED   I-Stat venous blood gas, ED   ED EKG   EKG 12-Lead   Insert peripheral IV X 1     OTHER Significant initial  Findings:  labs showing:     DM  labs:  HbA1C: No results for input(s): HGBA1C in the last 8760 hours.     CBG (last 3)  No results for input(s): GLUCAP in the last 72 hours.        Cultures:    Component Value Date/Time   SDES URINE, RANDOM 10/08/2017 1958   SPECREQUEST NONE 10/08/2017 1958   CULT (A) 10/08/2017 1958    <10,000 COLONIES/mL INSIGNIFICANT GROWTH Performed at [facility], [address], [place], [place] [address]    REPTSTATUS 10/10/2017 FINAL 10/08/2017 1958     Radiological Exams on Admission: DG Chest Port 1 View Result Date: 04/04/2024 EXAM: 1 AP VIEW XRAY OF THE CHEST 04/04/2024 04:27:00 PM COMPARISON: 03/26/2024. CLINICAL HISTORY: Questionable sepsis - evaluate for abnormality. FINDINGS: LUNGS AND PLEURA: Interstitial prominence may reflect early interstitial edema. No pleural effusion. No pneumothorax. HEART AND  MEDIASTINUM: Cardiomegaly, vascular congestion. BONES AND  SOFT TISSUES: No acute osseous abnormality. IMPRESSION: 1. Cardiomegaly with vascular congestion. 2. Interstitial prominence concerning for early interstitial edema. Electronically signed by: Franky Crease MD 04/04/2024 05:12 PM EST RP Workstation: [id number]   _______________________________________________________________________________________________________ Latest  Blood pressure 103/78, pulse 87, temperature (!) 97.4 F (36.3 C), temperature source Axillary, resp. rate 13, height 5' 1 (1.549 m), weight 86 kg, SpO2 93%.   Vitals  labs and radiology finding personally reviewed  Review of Systems:    Pertinent positives include: Cough ofBilateral lower extremity swelling     Constitutional:  No weight loss, night sweats, Fevers, chills, fatigue, weight loss  HEENT:  No headaches, Difficulty swallowing,Tooth/dental problems,Sore throat,  No sneezing, itching, ear ache, nasal congestion, post nasal drip,  Cardio-vascular:  No chest pain, Orthopnea, PND, anasarca, dizziness, palpitations.no  No heartburn, indigestion, abdominal pain, nausea, vomiting, diarrhea, change in bowel habits, loss of appetite, melena, blood in stool, hematemesis Resp:  no shortness of breath at rest. No dyspnea on exertion, No excess mucus, no productive cough, No non-productive cough, No coughing up of blood.No change in color of mucus.No wheezing. Skin:  no rash or lesions. No jaundice GU:  no dysuria, change in color of urine, no urgency or frequency. No straining to urinate.  No flank pain.  Musculoskeletal:  No joint pain or no joint swelling. No decreased range of motion. No back pain.  Psych:  No change in mood or affect. No depression or anxiety. No memory loss.  Neuro: no localizing neurological complaints, no tingling, no weakness, no double vision, no gait abnormality, no slurred speech, no confusion  All systems reviewed and apart from HOPI all are  negative _______________________________________________________________________________________________ Past Medical History:   Past Medical History:  Diagnosis Date   Anemia    Anxiety    Barrett esophagus    Cholelithiasis    Chronic respiratory failure with hypoxia (HCC)    CKD (chronic kidney disease) stage 3, GFR 30-59 ml/min (HCC)    Developmental delay    Diabetes mellitus type 2, uncontrolled    Dyslipidemia    Gallstones 01/2019   GERD (gastroesophageal reflux disease)    Hypertension    Mental disorder    Chronic developmental delay.; still lives with mother as primary care giver; spends 2-3 days in adult day-care.   Nephrolithiasis    Pulmonary hypertension (HCC)    Likely related to long-standing hypoxia and possible OSA/OHS (group 3) & ~DHF (Group 2); -- No longer on Pulmnoary Vasodilator (family request)   Right heart failure [facility])    Recovered as on July 2020      Past Surgical History:  Procedure Laterality Date   BIOPSY  10/25/2022   Procedure: BIOPSY;  Surgeon: Rosalie Kitchens, MD;  Location: [facility];  Service: Gastroenterology;;   CHOLECYSTECTOMY N/A 01/24/2019   Procedure: LAPAROSCOPIC CHOLECYSTECTOMY WITH INTRAOPERATIVE CHOLANGIOGRAM;  Surgeon: Belinda Cough, MD;  Location: [facility] OR;  Service: General;  Laterality: N/A;   CYSTOSCOPY/URETEROSCOPY/HOLMIUM LASER/STENT PLACEMENT Right 11/14/2017   Procedure: CYSTOSCOPY/URETEROSCOPY/RETROGRADE/STENT PLACEMENT;  Surgeon: Matilda Senior, MD;  Location: [facility];  Service: Urology;  Laterality: Right;  75 MINS  SPINAL OR MAC ANESTHESIA  ALSO EXTRACTION OF STONE   ESOPHAGOGASTRODUODENOSCOPY N/A 08/02/2017   Procedure: ESOPHAGOGASTRODUODENOSCOPY (EGD);  Surgeon: Rosalie Kitchens, MD;  Location: [facility];  Service: Endoscopy;  Laterality: N/A;   ESOPHAGOGASTRODUODENOSCOPY N/A 10/25/2022   Procedure: ESOPHAGOGASTRODUODENOSCOPY (EGD);  Surgeon: Rosalie Kitchens, MD;  Location: [facility];  Service: Gastroenterology;   Laterality: N/A;  GIVENS CAPSULE STUDY N/A 10/12/2017   Procedure: GIVENS CAPSULE STUDY;  Surgeon: Rosalie Kitchens, MD;  Location: [facility];  Service: Endoscopy;  Laterality: N/A;   POLYPECTOMY  10/25/2022   Procedure: POLYPECTOMY;  Surgeon: Rosalie Kitchens, MD;  Location: [facility];  Service: Gastroenterology;;   RIGHT/LEFT HEART CATH AND CORONARY ANGIOGRAPHY N/A 10/05/2017   Procedure: RIGHT/LEFT HEART CATH AND CORONARY ANGIOGRAPHY;  Surgeon: Rolan Ezra RAMAN, MD =  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 (R>L). RAP 16 mmHg, RVP 92/16 mmHg; PAP 93/51 mmHg mean 67, PCWP mn27 mmHg. LVP 110/21 - EDP 21 mmHg. AoP 115/75 mmHg.  PaO2 56%, AoO2 99%. CO/I (Fick) 4.95-2.65, PVR 8 WU, PAPi 2.65 (PVR 8 WU)   THROAT SURGERY     TRANSTHORACIC ECHOCARDIOGRAM  01/2015   Mild concentric LVH.  EF 60 to 65%.  GR 1 DD, but high filling pressures.  IVS had diastolic and systolic flattening consistent with volume and pressure overload.  PA pressures estimated 75 mmHg.   TRANSTHORACIC ECHOCARDIOGRAM  11/2018   Normal LV size and function.  EF 55 to 60%.  Moderate basal septal hypertrophy.  GR 1 DD.  No R WMA.  Normal RV size with preserved/normal function.  Moderate aortic sclerosis but no stenosis.  Mild aortic root dilation at 38 mm.  Lipomatous IAS    Social History:  Ambulatory   independently      reports that he has never smoked. He has never used smokeless tobacco. He reports that he does not drink alcohol  and does not use drugs.     Family History:   Family History  Problem Relation Age of Onset   Congestive Heart Failure Father    Heart disease Father    Heart attack Father    Atrial fibrillation Father    Brain cancer Father    Melanoma Father    Diabetes Mother    Stroke Mother    Hypertension Mother    Fainting Paternal Grandfather    Congestive Heart Failure Paternal Grandfather    Atrial fibrillation Paternal Grandfather     ______________________________________________________________________________________________ Allergies: Allergies  Allergen Reactions   Codeine Nausea And Vomiting     Prior to Admission medications   Medication Sig Start Date End Date Taking? Authorizing Provider  acetaminophen  (TYLENOL ) 650 MG CR tablet Take 650 mg by mouth every 8 (eight) hours as needed for pain. Patient not taking: Reported on 08/24/2023    [provider]  allopurinol  (ZYLOPRIM ) 300 MG tablet Take 300 mg by mouth daily.  01/18/15   [provider]  atorvastatin  (LIPITOR) 40 MG tablet Take 1 tablet (40 mg total) by mouth daily at 6 PM. 10/13/17   Tillery, Ozell Barter, PA-C  citalopram  (CELEXA ) 20 MG tablet Take 20 mg by mouth daily.    [provider]  desoximetasone (TOPICORT) 0.25 % cream Apply 1 Application topically 2 (two) times daily as needed (eczema). Patient not taking: Reported on 08/24/2023    [provider]  docusate sodium  (COLACE) 100 MG capsule Take 100 mg by mouth daily as needed for mild constipation.  Patient not taking: Reported on 08/24/2023    [provider]  Emollient ROSSIE ADVANCED THERAPY) CREA Apply 1 Application topically 2 (two) times daily as needed (For eczema). Patient not taking: Reported on 08/24/2023    [provider]  hydrOXYzine (ATARAX) 10 MG tablet Take 10 mg by mouth every 8 (eight) hours as needed for itching. Patient not taking: Reported  on 08/24/2023    [provider]  Iron-Vitamin C (VITRON-C) 65-125 MG TABS Take 1 tablet by mouth daily.    [provider]  levocetirizine (XYZAL ) 5 MG tablet Take 5 mg by mouth every other day.    [provider]  LORazepam  (ATIVAN ) 0.5 MG tablet Take 0.5 mg by mouth 2 (two) times daily as needed for anxiety. Patient not taking: Reported on 08/24/2023    [provider]  Menthol, Topical Analgesic, (BIOFREEZE PROFESSIONAL) 5 % GEL Apply 1  application  topically every 4 (four) hours as needed (pain). Patient not taking: Reported on 08/24/2023    [provider]  midodrine  (PROAMATINE ) 5 MG tablet Take 1 tablet (5 mg total) by mouth See admin instructions. Take 1 tablet (5 mg) by mouth 3 times daily, except take an additional tablet (5 mg) by mouth at midday on center days to maintain adequate blood pressure. Patient taking differently: Take 5 mg by mouth 3 (three) times daily. 08/27/19   Anner Alm ORN, MD  Multiple Vitamin (MULTIVITAMIN WITH MINERALS) TABS tablet Take 1 tablet by mouth daily with breakfast.  Patient not taking: Reported on 08/24/2023    [provider]  ondansetron  (ZOFRAN ) 4 MG tablet Take 4 mg by mouth every 8 (eight) hours as needed for nausea or vomiting. Patient not taking: Reported on 08/24/2023    [provider]  pantoprazole  (PROTONIX ) 40 MG tablet Take 40 mg by mouth 2 (two) times daily with a meal.     [provider]  potassium chloride  SA (K-DUR,KLOR-CON ) 20 MEQ tablet Take 1 tablet (20 mEq total) by mouth daily. 10/14/17   Lesia Ozell Barter, PA-C  senna-docusate (SENOKOT-S) 8.6-50 MG tablet Take 2 tablets by mouth at bedtime.    [provider]  torsemide  (DEMADEX ) 20 MG tablet Take 20 mg by mouth 2 (two) times daily.    [provider]    ___________________________________________________________________________________________________ Physical Exam:    04/04/2024    7:15 PM 04/04/2024    5:15 PM 04/04/2024    4:00 PM  Vitals with BMI  Systolic 103 103 888  Diastolic 78 74 81  Pulse 87 84 84     1. General:  in No ***Acute distress***increased work of breathing ***complaining of severe pain****agitated * Chronically ill *well *cachectic *toxic acutely ill -appearing 2. Psychological: Alert and *** Oriented 3. Head/ENT:   Moist *** Dry Mucous Membranes                          Head Non traumatic, neck supple                           Normal *** Poor Dentition 4. SKIN: normal *** decreased Skin turgor,  Skin clean Dry and intact no rash    5. Heart: Regular rate and rhythm no*** Murmur, no Rub or gallop 6. Lungs: ***Clear to auscultation bilaterally, no wheezes or crackles   7. Abdomen: Soft, ***non-tender, Non distended *** obese ***bowel sounds present 8. Lower extremities: no clubbing, cyanosis, no ***edema 9. Neurologically Grossly intact, moving all 4 extremities equally *** strength 5 out of 5 in all 4 extremities cranial nerves II through XII intact 10. MSK: Normal range of motion    Chart has been reviewed  ______________________________________________________________________________________________  Assessment/Plan 63 y.o. male with medical history significant of right heart failure, chronic diastolic CHF, postural hypotension, pulmonary hypertension dyslipidemia morbid obesity  developmental delay   Admitted for *** There are no diagnoses linked to this encounter.   Present on Admission: **None**     No problem-specific Assessment & Plan notes found for this encounter.    Other plan as per orders.  DVT prophylaxis:  SCD      Code Status:    Code Status: Prior FULL CODE  as per family  I had personally discussed CODE STATUS with patient and family  ACP   none     Family Communication:   Family   at  Bedside  plan of care was discussed  with  Brother , mother  Diet    Disposition Plan:      To home once workup is complete and patient is stable  ***Following barriers for discharge:                             Chest pain *** Stroke *** Syncope ***work up is complete                            Electrolytes corrected                               Anemia corrected h/H stable                             Pain controlled with PO medications                               Afebrile, white count improving able to transition to PO antibiotics                             Will need to be able to  tolerate PO                            Will likely need home health, home O2, set up                           Will need consultants to evaluate patient prior to discharge                           Work of breathing improves                        ***Would benefit from PT/OT eval prior to DC  Ordered                   Swallow eval - SLP ordered                   Diabetes care coordinator                   Transition of care consulted                   Nutrition    consulted                  Wound care  consulted  Palliative care    consulted                   Behavioral health  consulted                    Consults called: ***  NONE   Admission status:  ED Disposition     None        Obs***  ***  inpatient     I Expect 2 midnight stay secondary to severity of patient's current illness need for inpatient interventions justified by the following: ***hemodynamic instability despite optimal treatment (tachycardia *hypotension * tachypnea *hypoxia, hypercapnia) *** Severe lab/radiological/exam abnormalities including:    There are no diagnoses linked to this encounter. and extensive comorbidities including: *substance abuse  *Chronic pain *DM2  * CHF * CAD  * COPD/asthma *Morbid Obesity * CKD *dementia *liver disease *history of stroke with residual deficits *  malignancy, * sickle cell disease  History of amputation Chronic anticoagulation  That are currently affecting medical management.   I expect  patient to be hospitalized for 2 midnights requiring inpatient medical care.  Patient is at high risk for adverse outcome (such as loss of life or disability) if not treated.  Indication for inpatient stay as follows:  Severe change from baseline regarding mental status Hemodynamic instability despite maximal medical therapy,  severe pain requiring acute inpatient management,  inability to maintain oral hydration   persistent chest pain  despite medical management Need for operative/procedural  intervention New or worsening hypoxia ongoing suicidal ideations   Need for IV antibiotics, IV fluids,, IV pain medications, IV anticoagulation,  IV rate controling medications, IV antihypertensives need for biPAP Frequent labs    Level of care   *** tele  For 12H 24H     medical floor       progressive     stepdown   tele indefinitely please discontinue once patient no longer qualifies COVID-19 Labs    Critical***  Patient is critically ill due to  hemodynamic instability * respiratory failure *severe sepsis* ongoing chest pain*  They are at high risk for life/limb threatening clinical deterioration requiring frequent reassessment and modifications of care.  Services provided include examination of the patient, review of relevant ancillary tests, prescription of lifesaving therapies, review of medications and prophylactic therapy.  Total critical care time excluding separately billable procedures: 60*  Minutes.    Donzella Carrol 04/04/2024, 8:01 PM ***  [facility]     after 2 AM please page floor coverage   If 7AM-7PM, please contact the day team taking care of the patient using [email or web address]

## 2024-04-04 NOTE — ED Notes (Signed)
 CCMD called.

## 2024-04-04 NOTE — ED Triage Notes (Signed)
 Pt BIB [facility] from [facility] and has known developmental delays c/o shortness of breath and hypoxia. [facility] reports that the pt is non-compliant with his O2. EMS reports pt's O2 at 83% on 3L [place] and then was placed on a 12L NRB and improved to 100. Pt has no acute complaints, but does have a wet cough. Pt is supposed to wear oxygen  at baseline, but EMS is unsure how much he wears.  BP 118/78 HR 88 12L NRB 100% RR 18 BGL 268

## 2024-04-05 ENCOUNTER — Inpatient Hospital Stay (HOSPITAL_COMMUNITY): Payer: Medicare (Managed Care)

## 2024-04-05 ENCOUNTER — Other Ambulatory Visit: Payer: Self-pay

## 2024-04-05 DIAGNOSIS — J189 Pneumonia, unspecified organism: Secondary | ICD-10-CM | POA: Diagnosis not present

## 2024-04-05 DIAGNOSIS — I5031 Acute diastolic (congestive) heart failure: Secondary | ICD-10-CM | POA: Diagnosis not present

## 2024-04-05 DIAGNOSIS — J9621 Acute and chronic respiratory failure with hypoxia: Secondary | ICD-10-CM | POA: Diagnosis not present

## 2024-04-05 LAB — CBG MONITORING, ED
Glucose-Capillary: 180 mg/dL — ABNORMAL HIGH (ref 70–99)
Glucose-Capillary: 188 mg/dL — ABNORMAL HIGH (ref 70–99)
Glucose-Capillary: 138 mg/dL — ABNORMAL HIGH (ref 70–99)
Glucose-Capillary: 203 mg/dL — ABNORMAL HIGH (ref 70–99)

## 2024-04-05 LAB — GLUCOSE, CAPILLARY
Glucose-Capillary: 172 mg/dL — ABNORMAL HIGH (ref 70–99)
Glucose-Capillary: 247 mg/dL — ABNORMAL HIGH (ref 70–99)
Glucose-Capillary: 219 mg/dL — ABNORMAL HIGH (ref 70–99)

## 2024-04-05 LAB — COMPREHENSIVE METABOLIC PANEL WITH GFR
ALT: 41 U/L (ref 0–44)
AST: 29 U/L (ref 15–41)
Albumin: 2.6 g/dL — ABNORMAL LOW (ref 3.5–5.0)
Alkaline Phosphatase: 94 U/L (ref 38–126)
Anion gap: 14 (ref 5–15)
BUN: 36 mg/dL — ABNORMAL HIGH (ref 8–23)
CO2: 26 mmol/L (ref 22–32)
Calcium: 7.9 mg/dL — ABNORMAL LOW (ref 8.9–10.3)
Chloride: 101 mmol/L (ref 98–111)
Creatinine, Ser: 1.93 mg/dL — ABNORMAL HIGH (ref 0.61–1.24)
GFR, Estimated: 39 mL/min — ABNORMAL LOW (ref >60)
Glucose, Bld: 155 mg/dL — ABNORMAL HIGH (ref 70–99)
Potassium: 4 mmol/L (ref 3.5–5.1)
Sodium: 141 mmol/L (ref 135–145)
Total Bilirubin: 1.2 mg/dL (ref 0.0–1.2)
Total Protein: 5.1 g/dL — ABNORMAL LOW (ref 6.5–8.1)

## 2024-04-05 LAB — RENAL FUNCTION PANEL
Albumin: 2.6 g/dL — ABNORMAL LOW (ref 3.5–5.0)
Anion gap: 11 (ref 5–15)
BUN: 37 mg/dL — ABNORMAL HIGH (ref 8–23)
CO2: 28 mmol/L (ref 22–32)
Calcium: 7.8 mg/dL — ABNORMAL LOW (ref 8.9–10.3)
Chloride: 101 mmol/L (ref 98–111)
Creatinine, Ser: 1.96 mg/dL — ABNORMAL HIGH (ref 0.61–1.24)
GFR, Estimated: 38 mL/min — ABNORMAL LOW (ref >60)
Glucose, Bld: 156 mg/dL — ABNORMAL HIGH (ref 70–99)
Phosphorus: 3.4 mg/dL (ref 2.5–4.6)
Potassium: 3.9 mmol/L (ref 3.5–5.1)
Sodium: 140 mmol/L (ref 135–145)

## 2024-04-05 LAB — CBC
HCT: 51.8 % (ref 39.0–52.0)
Hemoglobin: 16.8 g/dL (ref 13.0–17.0)
MCH: 33.7 pg (ref 26.0–34.0)
MCHC: 32.4 g/dL (ref 30.0–36.0)
MCV: 104 fL — ABNORMAL HIGH (ref 80.0–100.0)
Platelets: 105 K/uL — ABNORMAL LOW (ref 150–400)
RBC: 4.98 MIL/uL (ref 4.22–5.81)
RDW: 16.3 % — ABNORMAL HIGH (ref 11.5–15.5)
WBC: 10.6 K/uL — ABNORMAL HIGH (ref 4.0–10.5)
nRBC: 0 % (ref 0.0–0.2)

## 2024-04-05 LAB — ECHOCARDIOGRAM COMPLETE
AV Mean grad: 12.5 mmHg
AV Peak grad: 23.9 mmHg
Ao pk vel: 2.45 m/s
Area-P 1/2: 4.6 cm2
Height: 61 in
S' Lateral: 2.9 cm
Single Plane A4C EF: 62.9 %
Weight: 3033.53 [oz_av]

## 2024-04-05 LAB — PHOSPHORUS: Phosphorus: 3.3 mg/dL (ref 2.5–4.6)

## 2024-04-05 LAB — HIV ANTIBODY (ROUTINE TESTING W REFLEX): HIV Screen 4th Generation wRfx: NONREACTIVE

## 2024-04-05 LAB — LACTIC ACID, PLASMA: Lactic Acid, Venous: 1.2 mmol/L (ref 0.5–1.9)

## 2024-04-05 LAB — D-DIMER, QUANTITATIVE: D-Dimer, Quant: 0.45 ug{FEU}/mL (ref 0.00–0.50)

## 2024-04-05 LAB — MAGNESIUM: Magnesium: 2.3 mg/dL (ref 1.7–2.4)

## 2024-04-05 LAB — TROPONIN I (HIGH SENSITIVITY): Troponin I (High Sensitivity): 34 ng/L — ABNORMAL HIGH (ref <18)

## 2024-04-05 LAB — HEMOGLOBIN A1C
Hgb A1c MFr Bld: 8.8 % — ABNORMAL HIGH (ref 4.8–5.6)
Mean Plasma Glucose: 206 mg/dL

## 2024-04-05 MED ORDER — ATORVASTATIN CALCIUM 40 MG PO TABS
40.0000 mg | ORAL_TABLET | Freq: Every day | ORAL | Status: DC
  Administered 2024-04-05 – 2024-04-11 (×7): 40 mg via ORAL
  Filled 2024-04-05 (×7): qty 1

## 2024-04-05 MED ORDER — SODIUM CHLORIDE 0.9% FLUSH: 3.0000 mL | INTRAVENOUS | Status: DC | PRN

## 2024-04-05 MED ORDER — ALBUTEROL SULFATE (2.5 MG/3ML) 0.083% IN NEBU: 2.5000 mg | INHALATION_SOLUTION | RESPIRATORY_TRACT | Status: DC | PRN

## 2024-04-05 MED ORDER — SODIUM CHLORIDE 0.9 % IV SOLN: 250.0000 mL | INTRAVENOUS | Status: AC | PRN

## 2024-04-05 MED ORDER — SODIUM CHLORIDE 0.9% FLUSH
3.0000 mL | Freq: Two times a day (BID) | INTRAVENOUS | Status: DC
  Administered 2024-04-05 – 2024-04-12 (×13): 3 mL via INTRAVENOUS

## 2024-04-05 MED ORDER — MIDODRINE HCL 5 MG PO TABS
5.0000 mg | ORAL_TABLET | Freq: Three times a day (TID) | ORAL | Status: DC
  Administered 2024-04-05 – 2024-04-09 (×13): 5 mg via ORAL
  Filled 2024-04-05 (×13): qty 1

## 2024-04-05 MED ORDER — FUROSEMIDE 10 MG/ML IJ SOLN: 40.0000 mg | Freq: Two times a day (BID) | INTRAMUSCULAR | Status: DC

## 2024-04-05 MED ORDER — SODIUM CHLORIDE 0.9% FLUSH
3.0000 mL | Freq: Two times a day (BID) | INTRAVENOUS | Status: DC
  Administered 2024-04-05 – 2024-04-12 (×15): 3 mL via INTRAVENOUS

## 2024-04-05 MED ORDER — FUROSEMIDE 10 MG/ML IJ SOLN
120.0000 mg | Freq: Once | INTRAVENOUS | Status: DC
  Filled 2024-04-05: qty 12

## 2024-04-05 MED ORDER — INSULIN ASPART 100 UNIT/ML IJ SOLN
0.0000 [IU] | INTRAMUSCULAR | Status: DC
  Administered 2024-04-05: 2 [IU] via SUBCUTANEOUS
  Administered 2024-04-05: 3 [IU] via SUBCUTANEOUS
  Administered 2024-04-05: 2 [IU] via SUBCUTANEOUS
  Administered 2024-04-05: 3 [IU] via SUBCUTANEOUS
  Administered 2024-04-05: 2 [IU] via SUBCUTANEOUS
  Administered 2024-04-05: 1 [IU] via SUBCUTANEOUS
  Administered 2024-04-06: 3 [IU] via SUBCUTANEOUS
  Administered 2024-04-06 – 2024-04-07 (×7): 2 [IU] via SUBCUTANEOUS
  Administered 2024-04-07: 3 [IU] via SUBCUTANEOUS
  Administered 2024-04-07 (×2): 2 [IU] via SUBCUTANEOUS
  Administered 2024-04-08: 7 [IU] via SUBCUTANEOUS
  Administered 2024-04-08: 1 [IU] via SUBCUTANEOUS
  Filled 2024-04-05 (×3): qty 2
  Filled 2024-04-05: qty 4
  Filled 2024-04-05: qty 2
  Filled 2024-04-05: qty 3
  Filled 2024-04-05 (×5): qty 2
  Filled 2024-04-05: qty 1
  Filled 2024-04-05: qty 2
  Filled 2024-04-05: qty 1
  Filled 2024-04-05: qty 2
  Filled 2024-04-05: qty 3
  Filled 2024-04-05: qty 2
  Filled 2024-04-05: qty 3
  Filled 2024-04-05: qty 2
  Filled 2024-04-05: qty 3

## 2024-04-05 MED ORDER — ACETAMINOPHEN 325 MG PO TABS
650.0000 mg | ORAL_TABLET | Freq: Four times a day (QID) | ORAL | Status: DC | PRN
  Administered 2024-04-10 – 2024-04-11 (×2): 650 mg via ORAL
  Filled 2024-04-05 (×2): qty 2

## 2024-04-05 MED ORDER — GUAIFENESIN ER 600 MG PO TB12
600.0000 mg | ORAL_TABLET | Freq: Two times a day (BID) | ORAL | Status: DC
  Administered 2024-04-05 – 2024-04-12 (×15): 600 mg via ORAL
  Filled 2024-04-05 (×15): qty 1

## 2024-04-05 MED ORDER — ONDANSETRON HCL 4 MG PO TABS: 4.0000 mg | ORAL_TABLET | Freq: Four times a day (QID) | ORAL | Status: DC | PRN

## 2024-04-05 MED ORDER — SODIUM CHLORIDE 0.9 % IV SOLN: 500.0000 mg | INTRAVENOUS | Status: DC

## 2024-04-05 MED ORDER — FUROSEMIDE 10 MG/ML IJ SOLN
120.0000 mg | Freq: Three times a day (TID) | INTRAVENOUS | Status: DC
  Administered 2024-04-05: 120 mg via INTRAVENOUS
  Filled 2024-04-05: qty 10
  Filled 2024-04-05: qty 12

## 2024-04-05 MED ORDER — FUROSEMIDE 10 MG/ML IJ SOLN
40.0000 mg | Freq: Every day | INTRAMUSCULAR | Status: DC
  Administered 2024-04-05: 40 mg via INTRAVENOUS
  Filled 2024-04-05: qty 4

## 2024-04-05 MED ORDER — AZITHROMYCIN 250 MG PO TABS
500.0000 mg | ORAL_TABLET | Freq: Every day | ORAL | Status: DC
  Administered 2024-04-05 – 2024-04-09 (×5): 500 mg via ORAL
  Filled 2024-04-05 (×5): qty 2

## 2024-04-05 MED ORDER — FUROSEMIDE 10 MG/ML IJ SOLN
120.0000 mg | Freq: Once | INTRAVENOUS | Status: AC
  Administered 2024-04-06: 120 mg via INTRAVENOUS
  Filled 2024-04-05: qty 10

## 2024-04-05 MED ORDER — ONDANSETRON HCL 4 MG/2ML IJ SOLN: 4.0000 mg | Freq: Four times a day (QID) | INTRAMUSCULAR | Status: DC | PRN

## 2024-04-05 MED ORDER — ACETAMINOPHEN 650 MG RE SUPP: 650.0000 mg | Freq: Four times a day (QID) | RECTAL | Status: DC | PRN

## 2024-04-05 MED ORDER — SODIUM CHLORIDE 0.9 % IV SOLN
2.0000 g | INTRAVENOUS | Status: DC
  Administered 2024-04-05 – 2024-04-08 (×4): 2 g via INTRAVENOUS
  Filled 2024-04-05 (×4): qty 20

## 2024-04-05 MED ORDER — HYDROCODONE-ACETAMINOPHEN 5-325 MG PO TABS
1.0000 | ORAL_TABLET | ORAL | Status: DC | PRN
  Administered 2024-04-11: 1 via ORAL
  Filled 2024-04-05: qty 2
  Filled 2024-04-05: qty 1

## 2024-04-05 NOTE — Assessment & Plan Note (Signed)
 this patient has acute respiratory failure with Hypoxia and  as documented by the presence of following: O2 saturatio< 90% on RA   Likely due to: CHF exacerbation   Continuous pulse ox   check Pulse ox with ambulation prior to discharge   may need  TC consult for home O2 set up    flutter valve ordered

## 2024-04-05 NOTE — Progress Notes (Signed)
 [facility]                                                                              Tony Jones, is a 63 y.o. male, DOB - 05/16/1960, FMW:[id number] Admit date - 04/04/2024    Outpatient Primary MD for the patient is [name], [name], DO  LOS - 1  days  Chief Complaint  Patient presents with   Shortness of Breath       Brief summary   Patient is a 63 year old male with history of chronic diastolic CHF, right heart failure, postural hypotension, HTN, dyslipidemia, morbid obesity, pulmonary hypertension developmental delay presented with cough, shortness of breath, LE edema.  Patient has not been using O2, was supposed to be on O2 at home.  Patient was found to be hypoxic by EMS, O2 sats down to 83% on 3 L, was placed on 12L via NRB.  Per family, has been having coughing, ongoing for the past week, productive.  Patient has been having trouble laying flat, was given prescription for Lasix  but not improving.  No fevers or chills. Was given prescription for Lasix  but not improving.  Also on Augmentin but does not seem to help. Patient also has history of right ventricular failure related to pulmonary hypertension, follows Dr. Anner with cardiology. Per family, takes Lasix  20 mg daily.  For last 5 days, Lasix  had been increased to 40 and lately even 80 mg without significant improvement  Assessment & Plan      Acute on chronic respiratory failure with hypoxemia (HCC) Acute on chronic diastolic CHF Pulmonary hypertension, right ventricular failure -Multi factorial, likely due to acute diastolic CHF, right heart failure pulm hypertension, possible underlying pneumonia, obesity hypoventilation.  Chest x-ray consistent with interstitial edema/pulmonary edema.  BNP 1097.5, troponin 31-31-34 - Still hypoxic, O2 sats 90 to 94% on 4 L O2 via [place] - Placed on scheduled IV Lasix , 40 mg daily, borderline BP, not on any cardiac meds.   - BP soft, resume midodrine  to allow effective  diuresis, will defer to cardiology.  (?  Outpatient on torsemide ) - Follow renal function, strict I's and O's and daily weights - Follow 2D echo  - Home evaluation prior to discharge    CAP (community acquired pneumonia) - Chest x-ray more consistent with pulmonary edema, no infiltrates.  BNP elevated - Procalcitonin 0.12, lactic acid 1.2.  Productive cough - Continue IV Zithromax, Rocephin, pulmonary hygiene,   History of postural hypotension - Patient has been taking midodrine  5 mg 3 times daily, will continue  Diabetes mellitus type 2 - Continue sliding scale insulin  while inpatient - Follow hemoglobin A1c   Dyslipidemia - Continue Lipitor  Obesity class II Estimated body mass index is 35.82 kg/m as calculated from the following:   Height as of this encounter: 5' 1 (1.549 m).   Weight as of this encounter: 86 kg.  Code Status: Full code DVT Prophylaxis:  SCDs Start: 04/05/24 0725   Level of Care: Level of care: Progressive Family Communication: Updated patient's mother and brother at the bedside Disposition Plan:      Remains inpatient appropriate:  Procedures:    Consultants:   Cardiology  Antimicrobials:   Anti-infectives (From admission, onward)    Start     Dose/Rate Route Frequency Ordered Stop   04/05/24 1800  azithromycin (ZITHROMAX) 500 mg in sodium chloride  0.9 % 250 mL IVPB        500 mg 250 mL/hr over 60 Minutes Intravenous Every 24 hours 04/05/24 0009     04/05/24 1600  cefTRIAXone (ROCEPHIN) 2 g in sodium chloride  0.9 % 100 mL IVPB        2 g 200 mL/hr over 30 Minutes Intravenous Every 24 hours 04/05/24 0009     04/04/24 1800  cefTRIAXone (ROCEPHIN) 2 g in sodium chloride  0.9 % 100 mL IVPB        2 g 200 mL/hr over 30 Minutes Intravenous  Once 04/04/24 1755 04/04/24 1855   04/04/24 1800  azithromycin (ZITHROMAX) 500 mg in sodium chloride  0.9 % 250 mL IVPB        500 mg 250 mL/hr over 60 Minutes Intravenous  Once 04/04/24 1755 04/04/24  2002          Medications  guaiFENesin  600 mg Oral BID   insulin  aspart  0-9 Units Subcutaneous Q4H   sodium chloride  flush  3 mL Intravenous Q12H   sodium chloride  flush  3 mL Intravenous Q12H      Subjective:   Tony Jones was seen and examined today.  Still has some shortness of breath, wants to sit up, feels cough is better this morning.  Mother and brother at the bedside.  No chest pain, nausea vomiting, abdominal pain, fevers or chills. Objective:   Vitals:   04/05/24 0630 04/05/24 0845 04/05/24 0923 04/05/24 0924  BP: 100/74 102/75    Pulse: 83 87    Resp: 16 15    Temp:    (!) 97.4 F (36.3 C)  TempSrc:    Oral  SpO2: 93% 95% 94%   Weight:      Height:        Intake/Output Summary (Last 24 hours) at 04/05/2024 1020 Last data filed at 04/05/2024 0136 Gross per 24 hour  Intake 3 ml  Output --  Net 3 ml     Wt Readings from Last 3 Encounters:  04/04/24 86 kg  08/24/23 85.8 kg  07/04/23 86 kg     Exam General: Alert and oriented x 3, NAD Cardiovascular: S1 S2 auscultated,  RRR Respiratory: Diminished BS at the bases, no wheezing Gastrointestinal: Soft, nontender, nondistended, + bowel sounds Ext: 2+ pedal edema bilaterally Neuro: Moving all 4 extremities Psych: Appears close to his baseline    Data Reviewed:  I have personally reviewed following labs    CBC Lab Results  Component Value Date   WBC 10.6 (H) 04/05/2024   RBC 4.98 04/05/2024   HGB 16.8 04/05/2024   HCT 51.8 04/05/2024   MCV 104.0 (H) 04/05/2024   MCH 33.7 04/05/2024   PLT 105 (L) 04/05/2024   MCHC 32.4 04/05/2024   RDW 16.3 (H) 04/05/2024   LYMPHSABS 0.8 04/04/2024   MONOABS 0.5 04/04/2024   EOSABS 0.3 04/04/2024   BASOSABS 0.0 04/04/2024     Last metabolic panel Lab Results  Component Value Date   NA 140 04/05/2024   K 3.9 04/05/2024   CL 101 04/05/2024   CO2 28 04/05/2024   BUN 37 (H) 04/05/2024   CREATININE 1.96 (H) 04/05/2024   GLUCOSE 156 (H) 04/05/2024    GFRNONAA 38 (L) 04/05/2024  GFRAA 44 (L) 01/25/2019   CALCIUM  7.8 (L) 04/05/2024   PHOS 3.4 04/05/2024   PROT 5.3 (L) 04/04/2024   ALBUMIN  2.6 (L) 04/05/2024   BILITOT 1.3 (H) 04/04/2024   ALKPHOS 92 04/04/2024   AST 30 04/04/2024   ALT 50 (H) 04/04/2024   ANIONGAP 11 04/05/2024    CBG (last 3)  Recent Labs    04/05/24 0118 04/05/24 0443 04/05/24 0822  GLUCAP 180* 188* 138*      Coagulation Profile: Recent Labs  Lab 04/04/24 1740  INR 1.2     Radiology Studies: I have personally reviewed the imaging studies  DG Chest Port 1 View Result Date: 04/04/2024 EXAM: 1 AP VIEW XRAY OF THE CHEST 04/04/2024 04:27:00 PM COMPARISON: 03/26/2024. CLINICAL HISTORY: Questionable sepsis - evaluate for abnormality. FINDINGS: LUNGS AND PLEURA: Interstitial prominence may reflect early interstitial edema. No pleural effusion. No pneumothorax. HEART AND MEDIASTINUM: Cardiomegaly, vascular congestion. BONES AND SOFT TISSUES: No acute osseous abnormality. IMPRESSION: 1. Cardiomegaly with vascular congestion. 2. Interstitial prominence concerning for early interstitial edema. Electronically signed by: Franky Crease MD 04/04/2024 05:12 PM EST RP Workstation: [id number]       Nydia Distance M.D. [facility] 04/05/2024, 10:20 AM  Available via Epic secure chat 7am-7pm After 7 pm, please refer to night coverage provider listed on amion.

## 2024-04-05 NOTE — Assessment & Plan Note (Signed)
 -  Order Sensitive  SSI     -  check TSH and HgA1C  - Hold by mouth medications*

## 2024-04-05 NOTE — Assessment & Plan Note (Signed)
 Contributing to comorbidity and complicating medical management  Body mass index is 35.82 kg/m.  Nutritional follow up as an out pt would be recommended

## 2024-04-05 NOTE — Assessment & Plan Note (Signed)
 Likely also contributing to lower extremity edema.  Patient did not tolerate dose of Lasix  with hypotension will appreciate cardiology consult no need careful titration of diuretics

## 2024-04-05 NOTE — Assessment & Plan Note (Signed)
-  chronic avoid nephrotoxic medications such as NSAIDs, Vanco Zosyn combo,  avoid hypotension, continue to follow renal function Worsening renal function in the setting of history of CHF monitor renal function after diuresis Noted to be hypotensive after Lasix

## 2024-04-05 NOTE — Assessment & Plan Note (Signed)
-   Pt diagnosed with CHF based on presence of the following: , OA, cardiomegaly, Pulmonary edema on CXR,  bilateral leg edema,    With noted response to IV diuretic in ER  admit on telemetry,  cycle cardiac enzymes, Cardiac Panel (last 3 results) Recent Labs    04/04/24 1609 04/04/24 2233  TROPONINIHS 31* 31*     obtain serial ECG  to evaluate for ischemia as a cause of heart failure  monitor daily weight:  Filed Weights   04/04/24 1548  Weight: 86 kg   Last BNP BNP (last 3 results) Recent Labs    04/04/24 1609  BNP 1,097.5*   Given a dose of lasix  but noted to have soft bp and monitor orthostatics and creatinine to avoid over diuresis.  Order echogram to evaluate EF and valves      cardiology  emailed

## 2024-04-05 NOTE — Progress Notes (Signed)
 Heart Failure Navigator Progress Note  Assessed for Heart & Vascular TOC clinic readiness.  Patient does not meet criteria due to he is an Advanced Heart Failure Team patient of Dr. Rolan. Has a scheduled hospital follow up in AHF clinic on 04/20/2024. .   Navigator will sign off at this time.   Stephane Haddock, BSN, Scientist, Clinical (histocompatibility And Immunogenetics) Only

## 2024-04-05 NOTE — Progress Notes (Signed)
  Echocardiogram 2D Echocardiogram has been performed.  [name], RDCS 04/05/2024, 11:49 AM

## 2024-04-05 NOTE — Assessment & Plan Note (Signed)
 History of pulmonary hypertension which likely contributing to long-term hypoxia appreciate cardiology consult

## 2024-04-05 NOTE — Progress Notes (Signed)
 RN called d/t pt desat in the 80's- per RN she placed pt on 7 lpm HFNC.  Currently sat 93-94% on 7 lpm oxygen .  No distress currently noted, pt is currently resting.

## 2024-04-05 NOTE — Consult Note (Signed)
 Cardiology Consultation   Patient ID: Tony Jones MRN: [id number]; DOB: [birth year]  Admit date: 04/04/2024 Date of Consult: 04/05/2024  PCP:  [name] Annabella CROME, DO   [facility] Providers Cardiologist:  Alm Clay, MD        Patient Profile: Tony Jones is a 63 y.o. 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  History of Present Illness: Tony Jones follows with Dr. Clay. Patient's cognitive delay has made medical management challenging at times as he does not tolerate CPAP machine and intermittently uses his home oxygen  during this day. He does have longstanding hypotension requiring midodrine  which has limited medication options for treating his pulmonary hypertension, RV failure and chronic diastolic heart failure.  He was last seen 08/24/23 by Dr. Clay and was overall doing okay.   Presented to the ED 12/3 pace for shortness of breath and hypoxia from [facility].  On EMS arrival SpO2 was 83% on 3 L nasal cannula.  He was transition to 12 L on a nonrebreather which improved to his SpO2 to 100%. In the ED: BP: 111/81 ECG: Sinus rhythm, RAD, abnormal R wave progression.  T wave abnormalities in inferior and V1 through V5 leads.  VR 82 [similar to previous] CXR showed cardiomegaly with vascular congestion, evidence of early interstitial edema Echocardiogram LVEF 60 to 65% with no RWMA. G2 DD.  New severely enlarged RV, with normal systolic function. PASP 80.6 mmHg. mildly dilated RA.  IV septum flattened in systole and diastole.  Elevated RA pressure.  Pertinent lab work: WBC 11.8 with left shift Hgb 19.5, now resolved Cr 2.13 -> 1.9 [baseline unclear, though most likely~2] Albumin   2.7 BNP 1097    Troponin 31 -> 31-> 34  Patient has received IV Lasix  40 mg x 2 and IV antibiotics.  No I's and O's charted.  On interview, patient was very somnolent.  He was arousable though would quickly fall back asleep. Mother was at bedside who provided majority of history.   Mother reported about 3 weeks ago her son's appetite decreased. Then about 2 weeks ago she noted he developed a cough, that is when [facility] started him on an antibiotic for a pneumonia.  She also noted that they also started increasing his Lasix  dose with most recent prior to admission being Lasix  80 mg twice daily without improvement in symptoms. Per mother unsure when patient was switched from torsemide  to lasix  by [facility].  Mother reported that his extreme fatigue is not normal for him, and this is a newer symptom. He does not use his CPAP at night, though will use his [place] at night.  He is supposed to be wearing oxygen  during the day, though does not use it unless he is feeling bad.  Past Medical History:  Diagnosis Date   Anemia    Anxiety    Barrett esophagus    Cholelithiasis    Chronic respiratory failure with hypoxia (HCC)    CKD (chronic kidney disease) stage 3, GFR 30-59 ml/min (HCC)    Developmental delay    Diabetes mellitus type 2, uncontrolled    Dyslipidemia    Gallstones 01/2019   GERD (gastroesophageal reflux disease)    Hypertension    Mental disorder  Chronic developmental delay.; still lives with mother as primary care giver; spends 2-3 days in adult day-care.   Nephrolithiasis    Pulmonary hypertension (HCC)    Likely related to long-standing hypoxia and possible OSA/OHS (group 3) & ~DHF (Group 2); -- No longer on Pulmnoary Vasodilator (family request)   Right heart failure [facility])    Recovered as on July 2020    Past Surgical History:  Procedure Laterality Date   BIOPSY  10/25/2022   Procedure: BIOPSY;  Surgeon: Rosalie Kitchens, MD;  Location: [facility];  Service: Gastroenterology;;    CHOLECYSTECTOMY N/A 01/24/2019   Procedure: LAPAROSCOPIC CHOLECYSTECTOMY WITH INTRAOPERATIVE CHOLANGIOGRAM;  Surgeon: Belinda Cough, MD;  Location: [facility] OR;  Service: General;  Laterality: N/A;   CYSTOSCOPY/URETEROSCOPY/HOLMIUM LASER/STENT PLACEMENT Right 11/14/2017   Procedure: CYSTOSCOPY/URETEROSCOPY/RETROGRADE/STENT PLACEMENT;  Surgeon: Matilda Senior, MD;  Location: [facility];  Service: Urology;  Laterality: Right;  75 MINS  SPINAL OR MAC ANESTHESIA  ALSO EXTRACTION OF STONE   ESOPHAGOGASTRODUODENOSCOPY N/A 08/02/2017   Procedure: ESOPHAGOGASTRODUODENOSCOPY (EGD);  Surgeon: Rosalie Kitchens, MD;  Location: [facility];  Service: Endoscopy;  Laterality: N/A;   ESOPHAGOGASTRODUODENOSCOPY N/A 10/25/2022   Procedure: ESOPHAGOGASTRODUODENOSCOPY (EGD);  Surgeon: Rosalie Kitchens, MD;  Location: [facility];  Service: Gastroenterology;  Laterality: N/A;   GIVENS CAPSULE STUDY N/A 10/12/2017   Procedure: GIVENS CAPSULE STUDY;  Surgeon: Rosalie Kitchens, MD;  Location: [facility];  Service: Endoscopy;  Laterality: N/A;   POLYPECTOMY  10/25/2022   Procedure: POLYPECTOMY;  Surgeon: Rosalie Kitchens, MD;  Location: [facility];  Service: Gastroenterology;;   RIGHT/LEFT HEART CATH AND CORONARY ANGIOGRAPHY N/A 10/05/2017   Procedure: RIGHT/LEFT HEART CATH AND CORONARY ANGIOGRAPHY;  Surgeon: Rolan Ezra RAMAN, MD =  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 (R>L). RAP 16 mmHg, RVP 92/16 mmHg; PAP 93/51 mmHg mean 67, PCWP mn27 mmHg. LVP 110/21 - EDP 21 mmHg. AoP 115/75 mmHg.  PaO2 56%, AoO2 99%. CO/I (Fick) 4.95-2.65, PVR 8 WU, PAPi 2.65 (PVR 8 WU)   THROAT SURGERY     TRANSTHORACIC ECHOCARDIOGRAM  01/2015   Mild concentric LVH.  EF 60 to 65%.  GR 1 DD, but high filling pressures.  IVS had diastolic and systolic flattening consistent with volume and pressure overload.  PA pressures estimated 75 mmHg.   TRANSTHORACIC ECHOCARDIOGRAM  11/2018   Normal LV size and function.  EF 55 to 60%.  Moderate basal  septal hypertrophy.  GR 1 DD.  No R WMA.  Normal RV size with preserved/normal function.  Moderate aortic sclerosis but no stenosis.  Mild aortic root dilation at 38 mm.  Lipomatous IAS       Scheduled Meds:  atorvastatin   40 mg Oral q1800   azithromycin   500 mg Oral Daily   furosemide   40 mg Intravenous Daily   guaiFENesin   600 mg Oral BID   insulin  aspart  0-9 Units Subcutaneous Q4H   midodrine   5 mg Oral TID   sodium chloride  flush  3 mL Intravenous Q12H   sodium chloride  flush  3 mL Intravenous Q12H   Continuous Infusions:  sodium chloride      sodium chloride      cefTRIAXone  (ROCEPHIN )  IV     PRN Meds: sodium chloride , sodium chloride , acetaminophen  **OR** acetaminophen , albuterol , HYDROcodone -acetaminophen , ondansetron  **OR** ondansetron  (ZOFRAN ) IV, sodium chloride  flush, sodium chloride  flush  Allergies:    Allergies  Allergen Reactions   Metformin  And Related Itching   Codeine Nausea And Vomiting    Social History:  Social History   Socioeconomic History   Marital status: Single    Spouse name: Not on file   Number of children: Not on file   Years of education: Not on file   Highest education level: Not on file  Occupational History   Occupation: unemployed  Tobacco Use   Smoking status: Never   Smokeless tobacco: Never  Vaping Use   Vaping status: Never Used  Substance and Sexual Activity   Alcohol  use: No   Drug use: No   Sexual activity: Never  Other Topics Concern   Not on file  Social History Narrative   Not on file   Social Drivers of Health   Financial Resource Strain: Not on file  Food Insecurity: No Food Insecurity (04/05/2024)   Hunger Vital Sign    Worried About Running Out of Food in the Last Year: Never true    Ran Out of Food in the Last Year: Never true  Transportation Needs: No Transportation Needs (04/05/2024)   PRAPARE - Administrator, Civil Service (Medical): No    Lack of Transportation (Non-Medical): No   Physical Activity: Not on file  Stress: Not on file  Social Connections: Not on file  Intimate Partner Violence: Patient Unable To Answer (04/05/2024)   Humiliation, Afraid, Rape, and Kick questionnaire    Fear of Current or Ex-Partner: Patient unable to answer    Emotionally Abused: Patient unable to answer    Physically Abused: Patient unable to answer    Sexually Abused: Patient unable to answer    Family History:   Family History  Problem Relation Age of Onset   Congestive Heart Failure Father    Heart disease Father    Heart attack Father    Atrial fibrillation Father    Brain cancer Father    Melanoma Father    Diabetes Mother    Stroke Mother    Hypertension Mother    Fainting Paternal Grandfather    Congestive Heart Failure Paternal Grandfather    Atrial fibrillation Paternal Grandfather      ROS:  Please see the history of present illness.  All other ROS reviewed and negative.     Physical Exam/Data: Vitals:   04/05/24 0924 04/05/24 1050 04/05/24 1100 04/05/24 1320  BP:  111/74 98/76 111/82  Pulse:   90 89  Resp:   15 16  Temp: (!) 97.4 F (36.3 C)   97.7 F (36.5 C)  TempSrc: Oral   Oral  SpO2:   98% 93%  Weight:      Height:        Intake/Output Summary (Last 24 hours) at 04/05/2024 1435 Last data filed at 04/05/2024 0136 Gross per 24 hour  Intake 3 ml  Output --  Net 3 ml      04/04/2024    3:48 PM 08/24/2023   10:25 AM 07/04/2023   10:01 AM  Last 3 Weights  Weight (lbs) 189 lb 9.5 oz 189 lb 3.2 oz 189 lb 9.6 oz  Weight (kg) 86 kg 85.821 kg 86.002 kg     Body mass index is 35.82 kg/m.  General:  Somnolent male sleeping with HFNC in place, noted apneic episodes Neck: JVD with HJR Vascular: Distal pulses 1+ bilaterally Cardiac:  normal S1, S2; RRR; no murmur  Lungs:  clear to auscultation bilaterally, no wheezing, rhonchi or rales  Abd: soft, distended, non-tender  Ext: 2-3+ bilateral pitting edema with compression stockings in place, cold  feet and hands  EKG:  The EKG was personally reviewed and demonstrates: See HPI Telemetry:  Telemetry was personally reviewed and demonstrates:  sinus rhythm HR 85  Relevant CV Studies: Echocardiogram 11/2018 IMPRESSIONS     1. The left ventricle has normal systolic function, with an ejection  fraction of 55-60%. The cavity size was normal. moderate basal septal  hypertrophy. Left ventricular diastolic Doppler parameters are consistent  with impaired relaxation. No evidence of   left ventricular regional wall motion abnormalities.   2. The right ventricle has normal systolic function. The cavity was  normal. There is no increase in right ventricular wall thickness. Right  ventricular systolic pressure could not be assessed.   3. The aortic valve is tricuspid. Moderate sclerosis of the aortic valve.   4. The aorta is abnormal in size and structure.   5. There is mild dilatation of the aortic root measuring 38 mm.   6. The interatrial septum appears to be lipomatous.   RHC/LHC 10/2017 1. Severe pulmonary arterial hypertension. PA 93/51 mean 67  2. Left and right heart filling pressures remain elevated, R>L.  3. Preserved cardiac output.  4. Nonobstructive coronary disease.   Laboratory Data: High Sensitivity Troponin:   Recent Labs  Lab 04/04/24 1609 04/04/24 2233 04/05/24 0137  TROPONINIHS 31* 31* 34*     Chemistry Recent Labs  Lab 04/04/24 1609 04/04/24 1934 04/05/24 0829  NA 134* 137 141  140  K 3.7 5.0 4.0  3.9  CL 98  --  101  101  CO2 28  --  26  28  GLUCOSE 253*  --  155*  156*  BUN 47*  --  36*  37*  CREATININE 2.13*  --  1.93*  1.96*  CALCIUM  7.8*  --  7.9*  7.8*  MG  --   --  2.3  GFRNONAA 34*  --  39*  38*  ANIONGAP 8  --  14  11    Recent Labs  Lab 04/04/24 1609 04/05/24 0829  PROT 5.3* 5.1*  ALBUMIN  2.7* 2.6*  2.6*  AST 30 29  ALT 50* 41  ALKPHOS 92 94  BILITOT 1.3* 1.2   Hematology Recent Labs  Lab 04/04/24 1609 04/04/24 1934  04/05/24 0845  WBC 11.8*  --  10.6*  RBC 5.76  --  4.98  HGB 19.5* 20.1* 16.8  HCT 57.7* 59.0* 51.8  MCV 100.2*  --  104.0*  MCH 33.9  --  33.7  MCHC 33.8  --  32.4  RDW 16.6*  --  16.3*  PLT 133*  --  105*    BNP Recent Labs  Lab 04/04/24 1609  BNP 1,097.5*     Radiology/Studies:  ECHOCARDIOGRAM COMPLETE Result Date: 04/05/2024    ECHOCARDIOGRAM REPORT   Patient Name:   Tony Jones Date of Exam: 04/05/2024 Medical Rec #:  [id number]    Height:       61.0 in Accession #:    [id number]   Weight:       189.6 lb Date of Birth:  22-Aug-1960     BSA:          1.846 m Patient Age:    63 years     BP:           98/76 mmHg Patient Gender: M            HR:           87 bpm. Exam Location:  Inpatient Procedure: 2D Echo, Cardiac Doppler, Color  Doppler and PEDOF (Both Spectral and            Color Flow Doppler were utilized during procedure). Indications:    CHF- Acute Diastolic I50.31  History:        Patient has prior history of Echocardiogram examinations, most                 recent 11/23/2018. Pulmonary HTN, Signs/Symptoms:Edema and                 Dyspnea; Risk Factors:Diabetes, Dyslipidemia, Hypertension and                 CKD.  Sonographer:    Koleen Popper RDCS Referring Phys: [id number] ANASTASSIA DOUTOVA IMPRESSIONS  1. Left ventricular ejection fraction, by estimation, is 60 to 65%. The left ventricle has normal function. The left ventricle has no regional wall motion abnormalities. There is mild concentric left ventricular hypertrophy. Left ventricular diastolic parameters are consistent with Grade II diastolic dysfunction (pseudonormalization). There is the interventricular septum is flattened in systole and diastole, consistent with right ventricular pressure and volume overload.  2. Right ventricular systolic function is normal. The right ventricular size is severely enlarged. Moderately increased right ventricular wall thickness. There is severely elevated pulmonary artery systolic pressure. The  estimated right ventricular systolic pressure is 80.6 mmHg.  3. Right atrial size was mildly dilated.  4. The mitral valve is normal in structure. No evidence of mitral valve regurgitation. No evidence of mitral stenosis.  5. The aortic valve is calcified. Aortic valve regurgitation is not visualized. Mild aortic valve stenosis. Aortic valve mean gradient measures 12.5 mmHg. Aortic valve Vmax measures 2.45 m/s.  6. The inferior vena cava is dilated in size with <50% respiratory variability, suggesting right atrial pressure of 15 mmHg. Comparison(s): Changes from prior study are noted. RV is now severely enlarged and there is evidence of RV volume and pressure overload. Mild aortic stenosis is now seen as well. FINDINGS  Left Ventricle: Left ventricular ejection fraction, by estimation, is 60 to 65%. The left ventricle has normal function. The left ventricle has no regional wall motion abnormalities. The left ventricular internal cavity size was normal in size. There is  mild concentric left ventricular hypertrophy. The interventricular septum is flattened in systole and diastole, consistent with right ventricular pressure and volume overload. Left ventricular diastolic parameters are consistent with Grade II diastolic dysfunction (pseudonormalization). Right Ventricle: The right ventricular size is severely enlarged. Moderately increased right ventricular wall thickness. Right ventricular systolic function is normal. There is severely elevated pulmonary artery systolic pressure. The tricuspid regurgitant velocity is 4.26 m/s, and with an assumed right atrial pressure of 8 mmHg, the estimated right ventricular systolic pressure is 80.6 mmHg. Left Atrium: Left atrial size was normal in size. Right Atrium: Right atrial size was mildly dilated. Pericardium: There is no evidence of pericardial effusion. Mitral Valve: The mitral valve is normal in structure. No evidence of mitral valve regurgitation. No evidence of mitral  valve stenosis. Tricuspid Valve: The tricuspid valve is normal in structure. Tricuspid valve regurgitation is mild . No evidence of tricuspid stenosis. Aortic Valve: The aortic valve is calcified. Aortic valve regurgitation is not visualized. Mild aortic stenosis is present. Aortic valve mean gradient measures 12.5 mmHg. Aortic valve peak gradient measures 23.9 mmHg. Pulmonic Valve: The pulmonic valve was not well visualized. Pulmonic valve regurgitation is mild to moderate. No evidence of pulmonic stenosis. Aorta: The aortic root is normal in size and structure. Venous: The  inferior vena cava is dilated in size with less than 50% respiratory variability, suggesting right atrial pressure of 15 mmHg. IAS/Shunts: No atrial level shunt detected by color flow Doppler.  LEFT VENTRICLE PLAX 2D LVIDd:         4.60 cm     Diastology LVIDs:         2.90 cm     LV e' medial:    3.92 cm/s LV PW:         1.10 cm     LV E/e' medial:  17.1 LV IVS:        1.50 cm     LV e' lateral:   4.57 cm/s                            LV E/e' lateral: 14.7  LV Volumes (MOD) LV vol d, MOD A4C: 51.2 ml LV vol s, MOD A4C: 19.0 ml LV SV MOD A4C:     51.2 ml RIGHT VENTRICLE             IVC RV Basal diam:  6.50 cm     IVC diam: 2.40 cm RV Mid diam:    6.00 cm RV S prime:     12.70 cm/s TAPSE (M-mode): 1.3 cm LEFT ATRIUM             Index        RIGHT ATRIUM           Index LA diam:        3.30 cm 1.79 cm/m   RA Area:     20.90 cm LA Vol (A2C):   18.5 ml 10.02 ml/m  RA Volume:   59.50 ml  32.23 ml/m LA Vol (A4C):   24.3 ml 13.16 ml/m LA Biplane Vol: 23.0 ml 12.46 ml/m  AORTIC VALVE AV Vmax:           244.50 cm/s AV Vmean:          166.000 cm/s AV VTI:            0.374 m AV Peak Grad:      23.9 mmHg AV Mean Grad:      12.5 mmHg LVOT Vmax:         108.00 cm/s LVOT Vmean:        74.200 cm/s LVOT VTI:          0.166 m LVOT/AV VTI ratio: 0.44  AORTA Ao Root diam: 3.40 cm MITRAL VALVE               TRICUSPID VALVE MV Area (PHT): 4.60 cm    TR Peak  grad:   72.6 mmHg MV Decel Time: 165 msec    TR Vmax:        426.00 cm/s MV E velocity: 67.10 cm/s MV A velocity: 97.50 cm/s  SHUNTS MV E/A ratio:  0.69        Systemic VTI: 0.17 m Joelle Cedars Tonleu Electronically signed by Joelle Cedars Ny Signature Date/Time: 04/05/2024/1:29:49 PM    Final    DG Chest Port 1 View Result Date: 04/04/2024 EXAM: 1 AP VIEW XRAY OF THE CHEST 04/04/2024 04:27:00 PM COMPARISON: 03/26/2024. CLINICAL HISTORY: Questionable sepsis - evaluate for abnormality. FINDINGS: LUNGS AND PLEURA: Interstitial prominence may reflect early interstitial edema. No pleural effusion. No pneumothorax. HEART AND MEDIASTINUM: Cardiomegaly, vascular congestion. BONES AND SOFT TISSUES: No acute osseous abnormality. IMPRESSION: 1. Cardiomegaly with vascular congestion. 2. Interstitial prominence  concerning for early interstitial edema. Electronically signed by: Franky Crease MD 04/04/2024 05:12 PM EST RP Workstation: [id number]     Assessment and Plan:  Acute on chronic diastolic heart failure RV failure Pulmonary hypertension Mother reports decreased appetite, fatigue, edema, and cough over the last 3 weeks. Reports he is not at his baseline mentation.  Imaging on admission shows evidence of volume overload. BNP elevated Patient had received gentle IV diuresis thus far, no urine output charted. Mother reports low urine output response, only about 8oz at a time, only several times after each lasix  injection  On exam, patient overtly volume up. His renal function has improved which is reassuring, though do think his IV diuresis should be increased.Will need to continue to monitor his BP with diuresis. Updating lactic acid as well to assess for low output.   Give IV lasix  120 mg, strict input/output ordered.   Hypotension BP: 111/82 Continue midodrine  5 mg 3 times daily  Hyperlipidemia Lipid panel in the a.m ordered Continue Lipitor 40 mg  Per primary Suspected  CAP T2DM Obesity  Risk Assessment/Risk Scores:       [place]  Heart Association (NYHA) Functional Class NYHA Class IV   For questions or updates, please contact [facility] Please consult [email or web address] for contact info under      Signed, Leontine LOISE Salen, PA-C  04/05/2024 2:35 PM

## 2024-04-05 NOTE — Assessment & Plan Note (Signed)
 Continue midodrine 

## 2024-04-05 NOTE — Assessment & Plan Note (Signed)
 Possible underlying pneumonia await results of procalcitonin patient with wet cough diminished ability to clear his secretions

## 2024-04-06 ENCOUNTER — Inpatient Hospital Stay (HOSPITAL_COMMUNITY): Payer: Medicare (Managed Care)

## 2024-04-06 ENCOUNTER — Encounter (HOSPITAL_COMMUNITY): Payer: Self-pay | Admitting: Internal Medicine

## 2024-04-06 DIAGNOSIS — J9621 Acute and chronic respiratory failure with hypoxia: Secondary | ICD-10-CM | POA: Diagnosis not present

## 2024-04-06 DIAGNOSIS — I5031 Acute diastolic (congestive) heart failure: Secondary | ICD-10-CM | POA: Diagnosis not present

## 2024-04-06 DIAGNOSIS — I50813 Acute on chronic right heart failure: Secondary | ICD-10-CM | POA: Diagnosis not present

## 2024-04-06 DIAGNOSIS — J189 Pneumonia, unspecified organism: Secondary | ICD-10-CM | POA: Diagnosis not present

## 2024-04-06 LAB — RENAL FUNCTION PANEL
Albumin: 2.5 g/dL — ABNORMAL LOW (ref 3.5–5.0)
Anion gap: 17 — ABNORMAL HIGH (ref 5–15)
BUN: 33 mg/dL — ABNORMAL HIGH (ref 8–23)
CO2: 26 mmol/L (ref 22–32)
Calcium: 7.7 mg/dL — ABNORMAL LOW (ref 8.9–10.3)
Chloride: 100 mmol/L (ref 98–111)
Creatinine, Ser: 1.86 mg/dL — ABNORMAL HIGH (ref 0.61–1.24)
GFR, Estimated: 40 mL/min — ABNORMAL LOW (ref >60)
Glucose, Bld: 110 mg/dL — ABNORMAL HIGH (ref 70–99)
Phosphorus: 2.9 mg/dL (ref 2.5–4.6)
Potassium: 3.5 mmol/L (ref 3.5–5.1)
Sodium: 143 mmol/L (ref 135–145)

## 2024-04-06 LAB — GLUCOSE, CAPILLARY
Glucose-Capillary: 129 mg/dL — ABNORMAL HIGH (ref 70–99)
Glucose-Capillary: 156 mg/dL — ABNORMAL HIGH (ref 70–99)
Glucose-Capillary: 163 mg/dL — ABNORMAL HIGH (ref 70–99)
Glucose-Capillary: 166 mg/dL — ABNORMAL HIGH (ref 70–99)
Glucose-Capillary: 176 mg/dL — ABNORMAL HIGH (ref 70–99)
Glucose-Capillary: 241 mg/dL — ABNORMAL HIGH (ref 70–99)

## 2024-04-06 LAB — CBC
HCT: 57.5 % — ABNORMAL HIGH (ref 39.0–52.0)
Hemoglobin: 19.2 g/dL — ABNORMAL HIGH (ref 13.0–17.0)
MCH: 33.3 pg (ref 26.0–34.0)
MCHC: 33.4 g/dL (ref 30.0–36.0)
MCV: 99.7 fL (ref 80.0–100.0)
Platelets: 125 K/uL — ABNORMAL LOW (ref 150–400)
RBC: 5.77 MIL/uL (ref 4.22–5.81)
RDW: 16.1 % — ABNORMAL HIGH (ref 11.5–15.5)
WBC: 12.3 K/uL — ABNORMAL HIGH (ref 4.0–10.5)
nRBC: 0 % (ref 0.0–0.2)

## 2024-04-06 LAB — LIPID PANEL
Cholesterol: 92 mg/dL (ref 0–200)
HDL: 41 mg/dL (ref >40)
LDL Cholesterol: 36 mg/dL (ref 0–99)
Total CHOL/HDL Ratio: 2.2 ratio
Triglycerides: 76 mg/dL (ref <150)
VLDL: 15 mg/dL (ref 0–40)

## 2024-04-06 MED ORDER — FUROSEMIDE 10 MG/ML IJ SOLN
120.0000 mg | Freq: Two times a day (BID) | INTRAVENOUS | Status: AC
  Administered 2024-04-06 (×2): 120 mg via INTRAVENOUS
  Filled 2024-04-06: qty 113.33
  Filled 2024-04-06: qty 10

## 2024-04-06 MED ORDER — POTASSIUM CHLORIDE CRYS ER 20 MEQ PO TBCR: 30.0000 meq | EXTENDED_RELEASE_TABLET | Freq: Two times a day (BID) | ORAL | Status: DC

## 2024-04-06 MED ORDER — POTASSIUM CHLORIDE CRYS ER 20 MEQ PO TBCR
40.0000 meq | EXTENDED_RELEASE_TABLET | Freq: Two times a day (BID) | ORAL | Status: AC
  Administered 2024-04-06 (×2): 40 meq via ORAL
  Filled 2024-04-06 (×2): qty 2

## 2024-04-06 NOTE — Progress Notes (Signed)
 Mobility Specialist Progress Note:    04/06/24 1240  Mobility  Activity Pivoted/transferred from bed to chair  Level of Assistance Minimal assist, patient does 75% or more  Assistive Device Other (Comment) (HHA)  Distance Ambulated (ft) 3 ft  Range of Motion/Exercises Active  Activity Response Tolerated fair  Mobility Referral Yes  Mobility visit 1 Mobility  Mobility Specialist Start Time (ACUTE ONLY) 1240  Mobility Specialist Stop Time (ACUTE ONLY) 1247  Mobility Specialist Time Calculation (min) (ACUTE ONLY) 7 min   Received pt laying in bed requested by RN and family to transfer pt to recliner. No c/o any symptoms. Pt hard of hearing and impulsive. Pt needing Mod<>Min to transfer to EOB. Pt needing Min<>CGA to stand and pivot w/ the ability to take steps to move. Returned pt to recliner w/ all needs met. RN in room.   Venetia Keel Mobility Specialist Please Neurosurgeon or Rehab Office at [phone]

## 2024-04-06 NOTE — Plan of Care (Signed)

## 2024-04-06 NOTE — Progress Notes (Signed)
 [facility]                                                                              Tony Jones, is a 63 y.o. male, DOB - [birth year], FMW:[id number] Admit date - 04/04/2024    Outpatient Primary MD for the patient is [name], [name], DO  LOS - 2  days  Chief Complaint  Patient presents with   Shortness of Breath       Brief summary   Patient is a 63 year old male with history of chronic diastolic CHF, right heart failure, postural hypotension, HTN, dyslipidemia, morbid obesity, pulmonary hypertension developmental delay presented with cough, shortness of breath, LE edema.  Patient has not been using O2, was supposed to be on O2 at home.  Patient was found to be hypoxic by EMS, O2 sats down to 83% on 3 L, was placed on 12L via NRB.  Per family, has been having coughing, ongoing for the past week, productive.  Patient has been having trouble laying flat, was given prescription for Lasix  but not improving.  No fevers or chills. Was given prescription for Lasix  but not improving.  Also on Augmentin but does not seem to help. Patient also has history of right ventricular failure related to pulmonary hypertension, follows Dr. Anner with cardiology. Per family, takes Lasix  20 mg daily.  For last 5 days, Lasix  had been increased to 40 and lately even 80 mg without significant improvement  Assessment & Plan      Acute on chronic respiratory failure with hypoxemia (HCC) Acute on chronic diastolic CHF Pulmonary hypertension, right ventricular failure -Multi factorial, likely due to acute diastolic CHF, right heart failure pulm hypertension, possible underlying pneumonia, obesity hypoventilation.  Chest x-ray consistent with interstitial edema/pulmonary edema.  BNP 1097.5, troponin 31-31-34, D-dimer negative - Still hypoxic, O2 sats 85 to 92% on 6 L this morning,  - Chest x-ray today showed pulmonary edema  - cardiology following, planning on increasing Lasix  to 120 mg 3  times daily.  Negative balance of 2.4 L - Continue midodrine  - 2D echo showed EF of 60 to 65%, G2 DD, interventricular septum flattened in systole and diastolic consistent with right ventricular pressure and volume overload.  Severely elevated pulmonary artery systolic pressure 80.6 mmHg. - Home evaluation prior to discharge    CAP (community acquired pneumonia) - Chest x-ray more consistent with pulmonary edema, no infiltrates.  BNP elevated - Procalcitonin 0.12, lactic acid 1.2.  Productive cough - Continue nebs, IV Zithromax , Rocephin ,  pulmonary hygiene,   History of postural hypotension - Patient has been taking midodrine  5 mg 3 times daily, will continue  Diabetes mellitus type 2 - Continue sliding scale insulin  while inpatient - hemoglobin A1c 8.8 CBG (last 3)  Recent Labs    04/06/24 0012 04/06/24 0403 04/06/24 0816  GLUCAP 163* 129* 156*      Dyslipidemia - Continue Lipitor  Obesity class II Estimated body mass index is 35.82 kg/m as calculated from the following:   Height as of this encounter: 5' 1 (1.549 m).   Weight as of this encounter: 86 kg.  Code Status: Full code DVT  Prophylaxis:  SCDs Start: 04/05/24 0725   Level of Care: Level of care: Progressive Family Communication: Updated patient's mother and brother at the bedside today Disposition Plan:      Remains inpatient appropriate:      Procedures:  2D echo  Consultants:   Cardiology  Antimicrobials:   Anti-infectives (From admission, onward)    Start     Dose/Rate Route Frequency Ordered Stop   04/05/24 1800  azithromycin  (ZITHROMAX ) 500 mg in sodium chloride  0.9 % 250 mL IVPB  Status:  Discontinued        500 mg 250 mL/hr over 60 Minutes Intravenous Every 24 hours 04/05/24 0009 04/05/24 1425   04/05/24 1600  cefTRIAXone  (ROCEPHIN ) 2 g in sodium chloride  0.9 % 100 mL IVPB        2 g 200 mL/hr over 30 Minutes Intravenous Every 24 hours 04/05/24 0009     04/05/24 1515  azithromycin   (ZITHROMAX ) tablet 500 mg        500 mg Oral Daily 04/05/24 1425     04/04/24 1800  cefTRIAXone  (ROCEPHIN ) 2 g in sodium chloride  0.9 % 100 mL IVPB        2 g 200 mL/hr over 30 Minutes Intravenous  Once 04/04/24 1755 04/04/24 1855   04/04/24 1800  azithromycin  (ZITHROMAX ) 500 mg in sodium chloride  0.9 % 250 mL IVPB        500 mg 250 mL/hr over 60 Minutes Intravenous  Once 04/04/24 1755 04/04/24 2002          Medications  atorvastatin   40 mg Oral q1800   azithromycin   500 mg Oral Daily   guaiFENesin   600 mg Oral BID   insulin  aspart  0-9 Units Subcutaneous Q4H   midodrine   5 mg Oral TID   potassium chloride   40 mEq Oral BID   sodium chloride  flush  3 mL Intravenous Q12H   sodium chloride  flush  3 mL Intravenous Q12H      Subjective:   Tony Jones was seen and examined today.  No CP, good diuresis, still hypoxic.  Mother and brother at the bedside.  No chest pain, nausea vomiting, abdominal pain, fevers or chills.  Objective:   Vitals:   04/06/24 0009 04/06/24 0400 04/06/24 0441 04/06/24 0817  BP: 119/81 115/82  103/81  Pulse: 89 89  96  Resp: 17 17  16   Temp: 98.3 F (36.8 C) 97.6 F (36.4 C)  97.6 F (36.4 C)  TempSrc: Oral Oral  Oral  SpO2: 93% 92%  (!) 85%  Weight:  86 kg 86 kg   Height:        Intake/Output Summary (Last 24 hours) at 04/06/2024 1128 Last data filed at 04/06/2024 0000 Gross per 24 hour  Intake 100 ml  Output 2500 ml  Net -2400 ml     Wt Readings from Last 3 Encounters:  04/06/24 86 kg  08/24/23 85.8 kg  07/04/23 86 kg   Physical Exam General: Alert and oriented, NAD Cardiovascular: S1 S2 clear, RRR.  Respiratory: dec BS at bases  Gastrointestinal: Soft, nontender, nondistended, NBS Ext: 2+ pedal edema bilaterally Neuro: no new deficits Skin: No rashes Psych: Appears close to his baseline mental status   Data Reviewed:  I have personally reviewed following labs    CBC Lab Results  Component Value Date   WBC 12.3 (H)  04/06/2024   RBC 5.77 04/06/2024   HGB 19.2 (H) 04/06/2024   HCT 57.5 (H) 04/06/2024   MCV 99.7 04/06/2024  MCH 33.3 04/06/2024   PLT 125 (L) 04/06/2024   MCHC 33.4 04/06/2024   RDW 16.1 (H) 04/06/2024   LYMPHSABS 0.8 04/04/2024   MONOABS 0.5 04/04/2024   EOSABS 0.3 04/04/2024   BASOSABS 0.0 04/04/2024     Last metabolic panel Lab Results  Component Value Date   NA 143 04/06/2024   K 3.5 04/06/2024   CL 100 04/06/2024   CO2 26 04/06/2024   BUN 33 (H) 04/06/2024   CREATININE 1.86 (H) 04/06/2024   GLUCOSE 110 (H) 04/06/2024   GFRNONAA 40 (L) 04/06/2024   GFRAA 44 (L) 01/25/2019   CALCIUM  7.7 (L) 04/06/2024   PHOS 2.9 04/06/2024   PROT 5.1 (L) 04/05/2024   ALBUMIN  2.5 (L) 04/06/2024   BILITOT 1.2 04/05/2024   ALKPHOS 94 04/05/2024   AST 29 04/05/2024   ALT 41 04/05/2024   ANIONGAP 17 (H) 04/06/2024    CBG (last 3)  Recent Labs    04/06/24 0012 04/06/24 0403 04/06/24 0816  GLUCAP 163* 129* 156*      Coagulation Profile: Recent Labs  Lab 04/04/24 1740  INR 1.2     Radiology Studies: I have personally reviewed the imaging studies  DG Chest Port 1 View Result Date: 04/06/2024 CLINICAL DATA:  [id number] Dyspnea [id number] EXAM: PORTABLE CHEST - 1 VIEW COMPARISON:  April 04, 2024 FINDINGS: Similar diffuse interstitial opacities throughout both lungs. No focal airspace consolidation, pleural effusion, or pneumothorax. Moderate cardiomegaly. Tortuous aorta. No acute fracture or destructive lesions. Multilevel thoracic osteophytosis. Right upper quadrant surgical clips. IMPRESSION: Redemonstrated cardiomegaly with little significant interval change to the diffuse interstitial edema. Electronically Signed   By: Rogelia Myers M.D.   On: 04/06/2024 09:44   US  EKG SITE RITE Result Date: 04/05/2024 If Site Rite image not attached, placement could not be confirmed due to current cardiac rhythm.  ECHOCARDIOGRAM COMPLETE Result Date: 04/05/2024    ECHOCARDIOGRAM REPORT    Patient Name:   Tony Jones Date of Exam: 04/05/2024 Medical Rec #:  [id number]    Height:       61.0 in Accession #:    [id number]   Weight:       189.6 lb Date of Birth:  [birth year]     BSA:          1.846 m Patient Age:    63 years     BP:           98/76 mmHg Patient Gender: M            HR:           87 bpm. Exam Location:  Inpatient Procedure: 2D Echo, Cardiac Doppler, Color Doppler and PEDOF (Both Spectral and            Color Flow Doppler were utilized during procedure). Indications:    CHF- Acute Diastolic I50.31  History:        Patient has prior history of Echocardiogram examinations, most                 recent 11/23/2018. Pulmonary HTN, Signs/Symptoms:Edema and                 Dyspnea; Risk Factors:Diabetes, Dyslipidemia, Hypertension and                 CKD.  Sonographer:    Koleen Popper RDCS Referring Phys: [id number] ANASTASSIA DOUTOVA IMPRESSIONS  1. Left ventricular ejection fraction, by estimation, is 60 to 65%. The left ventricle has normal function. The  left ventricle has no regional wall motion abnormalities. There is mild concentric left ventricular hypertrophy. Left ventricular diastolic parameters are consistent with Grade II diastolic dysfunction (pseudonormalization). There is the interventricular septum is flattened in systole and diastole, consistent with right ventricular pressure and volume overload.  2. Right ventricular systolic function is normal. The right ventricular size is severely enlarged. Moderately increased right ventricular wall thickness. There is severely elevated pulmonary artery systolic pressure. The estimated right ventricular systolic pressure is 80.6 mmHg.  3. Right atrial size was mildly dilated.  4. The mitral valve is normal in structure. No evidence of mitral valve regurgitation. No evidence of mitral stenosis.  5. The aortic valve is calcified. Aortic valve regurgitation is not visualized. Mild aortic valve stenosis. Aortic valve mean gradient measures 12.5 mmHg.  Aortic valve Vmax measures 2.45 m/s.  6. The inferior vena cava is dilated in size with <50% respiratory variability, suggesting right atrial pressure of 15 mmHg. Comparison(s): Changes from prior study are noted. RV is now severely enlarged and there is evidence of RV volume and pressure overload. Mild aortic stenosis is now seen as well. FINDINGS  Left Ventricle: Left ventricular ejection fraction, by estimation, is 60 to 65%. The left ventricle has normal function. The left ventricle has no regional wall motion abnormalities. The left ventricular internal cavity size was normal in size. There is  mild concentric left ventricular hypertrophy. The interventricular septum is flattened in systole and diastole, consistent with right ventricular pressure and volume overload. Left ventricular diastolic parameters are consistent with Grade II diastolic dysfunction (pseudonormalization). Right Ventricle: The right ventricular size is severely enlarged. Moderately increased right ventricular wall thickness. Right ventricular systolic function is normal. There is severely elevated pulmonary artery systolic pressure. The tricuspid regurgitant velocity is 4.26 m/s, and with an assumed right atrial pressure of 8 mmHg, the estimated right ventricular systolic pressure is 80.6 mmHg. Left Atrium: Left atrial size was normal in size. Right Atrium: Right atrial size was mildly dilated. Pericardium: There is no evidence of pericardial effusion. Mitral Valve: The mitral valve is normal in structure. No evidence of mitral valve regurgitation. No evidence of mitral valve stenosis. Tricuspid Valve: The tricuspid valve is normal in structure. Tricuspid valve regurgitation is mild . No evidence of tricuspid stenosis. Aortic Valve: The aortic valve is calcified. Aortic valve regurgitation is not visualized. Mild aortic stenosis is present. Aortic valve mean gradient measures 12.5 mmHg. Aortic valve peak gradient measures 23.9 mmHg.  Pulmonic Valve: The pulmonic valve was not well visualized. Pulmonic valve regurgitation is mild to moderate. No evidence of pulmonic stenosis. Aorta: The aortic root is normal in size and structure. Venous: The inferior vena cava is dilated in size with less than 50% respiratory variability, suggesting right atrial pressure of 15 mmHg. IAS/Shunts: No atrial level shunt detected by color flow Doppler.  LEFT VENTRICLE PLAX 2D LVIDd:         4.60 cm     Diastology LVIDs:         2.90 cm     LV e' medial:    3.92 cm/s LV PW:         1.10 cm     LV E/e' medial:  17.1 LV IVS:        1.50 cm     LV e' lateral:   4.57 cm/s  LV E/e' lateral: 14.7  LV Volumes (MOD) LV vol d, MOD A4C: 51.2 ml LV vol s, MOD A4C: 19.0 ml LV SV MOD A4C:     51.2 ml RIGHT VENTRICLE             IVC RV Basal diam:  6.50 cm     IVC diam: 2.40 cm RV Mid diam:    6.00 cm RV S prime:     12.70 cm/s TAPSE (M-mode): 1.3 cm LEFT ATRIUM             Index        RIGHT ATRIUM           Index LA diam:        3.30 cm 1.79 cm/m   RA Area:     20.90 cm LA Vol (A2C):   18.5 ml 10.02 ml/m  RA Volume:   59.50 ml  32.23 ml/m LA Vol (A4C):   24.3 ml 13.16 ml/m LA Biplane Vol: 23.0 ml 12.46 ml/m  AORTIC VALVE AV Vmax:           244.50 cm/s AV Vmean:          166.000 cm/s AV VTI:            0.374 m AV Peak Grad:      23.9 mmHg AV Mean Grad:      12.5 mmHg LVOT Vmax:         108.00 cm/s LVOT Vmean:        74.200 cm/s LVOT VTI:          0.166 m LVOT/AV VTI ratio: 0.44  AORTA Ao Root diam: 3.40 cm MITRAL VALVE               TRICUSPID VALVE MV Area (PHT): 4.60 cm    TR Peak grad:   72.6 mmHg MV Decel Time: 165 msec    TR Vmax:        426.00 cm/s MV E velocity: 67.10 cm/s MV A velocity: 97.50 cm/s  SHUNTS MV E/A ratio:  0.69        Systemic VTI: 0.17 m Tony Jones Electronically signed by Tony [place] Signature Date/Time: 04/05/2024/1:29:49 PM    Final    DG Chest Port 1 View Result Date: 04/04/2024 EXAM: 1 AP VIEW XRAY OF  THE CHEST 04/04/2024 04:27:00 PM COMPARISON: 03/26/2024. CLINICAL HISTORY: Questionable sepsis - evaluate for abnormality. FINDINGS: LUNGS AND PLEURA: Interstitial prominence may reflect early interstitial edema. No pleural effusion. No pneumothorax. HEART AND MEDIASTINUM: Cardiomegaly, vascular congestion. BONES AND SOFT TISSUES: No acute osseous abnormality. IMPRESSION: 1. Cardiomegaly with vascular congestion. 2. Interstitial prominence concerning for early interstitial edema. Electronically signed by: Franky Crease MD 04/04/2024 05:12 PM EST RP Workstation: [id number]       Nydia Distance M.D. [facility] 04/06/2024, 11:28 AM  Available via Epic secure chat 7am-7pm After 7 pm, please refer to night coverage provider listed on amion.

## 2024-04-06 NOTE — TOC CM/SW Note (Addendum)
 Transition of Care [facility]) - Inpatient Brief Assessment   Patient Details  Name: Tony Jones MRN: [id number] Date of Birth: [birth year]  [facility] CM/SW Contact:    Waddell Barnie Rama, RN Phone Number: 04/06/2024, 1:28 PM   Clinical Narrative: From home with mother who is his legal guardian,  has PCP and insurance on file, states has no HH services in place at this time, has home oxygen  3.5-4 liters , waker, w/chair and shower chair at home.  States family member will transport them home at [employer] and family is support system, states gets medications from [facility].  Pta self ambulatory.   There are no ICM  needs identified  at this time.  Please place consult for ICM  needs.  NCM contacted [facility] to notify them of this admission, spoke with an after hours person, he will have a Nurse to call this NCM back.    Transition of Care Asessment: Insurance and Status: Insurance coverage has been reviewed Patient has primary care physician: Yes Home environment has been reviewed: home with Mother who is his legal guardian Prior level of function:: indep Prior/Current Home Services: Current home services (walkers, shower chair, home oxygen  3.5- 4 liters) Social Drivers of Health Review: SDOH reviewed no interventions necessary Readmission risk has been reviewed: Yes Transition of care needs: no transition of care needs at this time

## 2024-04-06 NOTE — Progress Notes (Addendum)
  Progress Note  Patient Name: Tony Jones Date of Encounter: 04/06/2024 [facility] Cardiologist: Alm Clay, MD    Interval Summary    Patient had good urine output after receiving IV lasix  120 mg yesterday. His mom is at bedside. Reports that he usually wears 3-4 L oxygen  at home. Currently on 9 L. His ankle swelling has mildly improved. Hand and abdominal swelling also mildly improved. Unsure of dry weight. Of note- his mother would prefer to avoid having him in the ICU. Discussed that with his stable BP and good response to higher doses of lasix , we should be able to avoid this    Vital Signs Vitals:   04/06/24 0009 04/06/24 0400 04/06/24 0441 04/06/24 0817  BP: 119/81 115/82  103/81  Pulse: 89 89  96  Resp: 17 17  16   Temp: 98.3 F (36.8 C) 97.6 F (36.4 C)  97.6 F (36.4 C)  TempSrc: Oral Oral  Oral  SpO2: 93% 92%  (!) 85%  Weight:  86 kg 86 kg   Height:        Intake/Output Summary (Last 24 hours) at 04/06/2024 0921 Last data filed at 04/06/2024 0000 Gross per 24 hour  Intake 100 ml  Output 2500 ml  Net -2400 ml      04/06/2024    4:41 AM 04/06/2024    4:00 AM 04/04/2024    3:48 PM  Last 3 Weights  Weight (lbs) 189 lb 9.5 oz 189 lb 9.5 oz 189 lb 9.5 oz  Weight (kg) 86 kg 86 kg 86 kg      Telemetry/ECG  NSR  - Personally Reviewed  Physical Exam  GEN: No acute distress. Laying in the bed with head elevated    Neck: No JVD Cardiac:  RRR, no murmurs, rubs, or gallops.  Respiratory: Crackles in bilateral lung bases, otherwise clear. Normal WOB on 9 L via [place]  GI: Soft, distended  MS: 2+ edema in BLE. Hands and legs are warm, well perfused   Assessment & Plan   Acute on chronic RV failure  Acute on chronic HFpEF  Pulm HTN  - Echocardiogram this admission with LVEF 60-65%, no regional wall motion abnormalities, grade II DD, RV size severely enlarged, severely elevated PA systolic pressure, mild AS  - Patient previously seen by advanced heart  failure in 2020. At that time, suspected that pulm HTN was a combination of group 2 (diastolic CHF) and group 3 (OHS/OSA). Not able to tolerate CPAP  - Presented with decreased appetite, fatigue, edema, cough, worsening mentation. BNP 1097. Lactic acid within normal limits x2. He is warm on exam. LFTs normal yesterday. No evidence of shock at this time  - Given IV lasix  120 mg yesterday. Put out 2.5 L. Creatinine down from 2.13 on admission to 1.86  - Ordered IV lasix  120 mg BID today  - Continue strict I/Os, daily weights, BMP in AM  - Ordered potassium supplementation   Hypotension  - BP stable  - Continue midodrine  5 mg TID   HLD  - Continue lipitor 40 mg daily   For questions or updates, please contact [facility] Please consult [email or web address] for contact info under        Signed, Rollo FABIENE Louder, PA-C

## 2024-04-07 DIAGNOSIS — I5031 Acute diastolic (congestive) heart failure: Secondary | ICD-10-CM | POA: Diagnosis not present

## 2024-04-07 DIAGNOSIS — J9621 Acute and chronic respiratory failure with hypoxia: Secondary | ICD-10-CM | POA: Diagnosis not present

## 2024-04-07 DIAGNOSIS — E119 Type 2 diabetes mellitus without complications: Secondary | ICD-10-CM | POA: Diagnosis not present

## 2024-04-07 DIAGNOSIS — E669 Obesity, unspecified: Secondary | ICD-10-CM | POA: Diagnosis not present

## 2024-04-07 LAB — RENAL FUNCTION PANEL
Albumin: 2.8 g/dL — ABNORMAL LOW (ref 3.5–5.0)
Anion gap: 10 (ref 5–15)
BUN: 31 mg/dL — ABNORMAL HIGH (ref 8–23)
CO2: 36 mmol/L — ABNORMAL HIGH (ref 22–32)
Calcium: 7.8 mg/dL — ABNORMAL LOW (ref 8.9–10.3)
Chloride: 92 mmol/L — ABNORMAL LOW (ref 98–111)
Creatinine, Ser: 1.63 mg/dL — ABNORMAL HIGH (ref 0.61–1.24)
GFR, Estimated: 47 mL/min — ABNORMAL LOW (ref >60)
Glucose, Bld: 145 mg/dL — ABNORMAL HIGH (ref 70–99)
Phosphorus: 3.4 mg/dL (ref 2.5–4.6)
Potassium: 4.2 mmol/L (ref 3.5–5.1)
Sodium: 138 mmol/L (ref 135–145)

## 2024-04-07 LAB — GLUCOSE, CAPILLARY
Glucose-Capillary: 135 mg/dL — ABNORMAL HIGH (ref 70–99)
Glucose-Capillary: 151 mg/dL — ABNORMAL HIGH (ref 70–99)
Glucose-Capillary: 160 mg/dL — ABNORMAL HIGH (ref 70–99)
Glucose-Capillary: 162 mg/dL — ABNORMAL HIGH (ref 70–99)
Glucose-Capillary: 172 mg/dL — ABNORMAL HIGH (ref 70–99)
Glucose-Capillary: 174 mg/dL — ABNORMAL HIGH (ref 70–99)
Glucose-Capillary: 216 mg/dL — ABNORMAL HIGH (ref 70–99)

## 2024-04-07 LAB — CBC
HCT: 60.4 % — ABNORMAL HIGH (ref 39.0–52.0)
Hemoglobin: 20 g/dL — ABNORMAL HIGH (ref 13.0–17.0)
MCH: 33.6 pg (ref 26.0–34.0)
MCHC: 33.1 g/dL (ref 30.0–36.0)
MCV: 101.3 fL — ABNORMAL HIGH (ref 80.0–100.0)
Platelets: 120 K/uL — ABNORMAL LOW (ref 150–400)
RBC: 5.96 MIL/uL — ABNORMAL HIGH (ref 4.22–5.81)
RDW: 15.7 % — ABNORMAL HIGH (ref 11.5–15.5)
WBC: 13.1 K/uL — ABNORMAL HIGH (ref 4.0–10.5)
nRBC: 0 % (ref 0.0–0.2)

## 2024-04-07 MED ORDER — FUROSEMIDE 10 MG/ML IJ SOLN
120.0000 mg | Freq: Four times a day (QID) | INTRAVENOUS | Status: AC
  Administered 2024-04-07 (×2): 120 mg via INTRAVENOUS
  Filled 2024-04-07 (×2): qty 120

## 2024-04-07 NOTE — Progress Notes (Signed)
 [facility]                                                                              Tony Jones, is a 63 y.o. male, DOB - [birth year], FMW:[id number] Admit date - 04/04/2024    Outpatient Primary MD for the patient is [name], [name], DO  LOS - 3  days  Chief Complaint  Patient presents with   Shortness of Breath       Brief summary   Patient is a 63 year old male with history of chronic diastolic CHF, right heart failure, postural hypotension, HTN, dyslipidemia, morbid obesity, pulmonary hypertension developmental delay presented with cough, shortness of breath, LE edema.  Patient has not been using O2, was supposed to be on O2 at home.  Patient was found to be hypoxic by EMS, O2 sats down to 83% on 3 L, was placed on 12L via NRB.  Per family, has been having coughing, ongoing for the past week, productive.  Patient has been having trouble laying flat, was given prescription for Lasix  but not improving.  No fevers or chills. Was given prescription for Lasix  but not improving.  Also on Augmentin but does not seem to help. Patient also has history of right ventricular failure related to pulmonary hypertension, follows Dr. Anner with cardiology. Per family, takes Lasix  20 mg daily.  For last 5 days, Lasix  had been increased to 40 and lately even 80 mg without significant improvement  Assessment & Plan      Acute on chronic respiratory failure with hypoxemia (HCC) Acute on chronic diastolic CHF Pulmonary hypertension, right ventricular failure -Multi factorial, likely due to acute diastolic CHF, right heart failure pulm hypertension, possible underlying pneumonia, obesity hypoventilation.  Chest x-ray consistent with interstitial edema/pulmonary edema.  BNP 1097.5, troponin 31-31-34, D-dimer negative -  2D echo showed EF of 60 to 65%, G2 DD, interventricular septum flattened in systole and diastolic consistent with right ventricular pressure and volume overload.   Severely elevated pulmonary artery systolic pressure 80.6 mmHg. - Cardiology following, on high-dose IV Lasix , negative balance of 7.38 L.  Per mother at the bedside having excellent diuresis - Still hypoxic, O2 sats 99% on 10 L HFNC, no worsening of WOB - Per cardiology, CHF team to evaluate on Monday to see if fourth repeating right heart cath    CAP (community acquired pneumonia) - Chest x-ray more consistent with pulmonary edema, no infiltrates.  BNP elevated - Procalcitonin 0.12, lactic acid 1.2.  Productive cough - Continue nebs, IV Zithromax , Rocephin ,  pulmonary hygiene,   History of postural hypotension - Patient has been taking midodrine  5 mg 3 times daily, will continue  Diabetes mellitus type 2 - hemoglobin A1c 8.8 CBG (last 3)  Recent Labs    04/07/24 0039 04/07/24 0311 04/07/24 0720  GLUCAP 174* 135* 151*   Continue sliding scale insulin  while inpatient   Dyslipidemia - Continue Lipitor  Obesity class II Estimated body mass index is 35.56 kg/m as calculated from the following:   Height as of this encounter: 5' 1 (1.549 m).   Weight as of this encounter: 85.4 kg.  Code Status: Full code  DVT Prophylaxis:  SCDs Start: 04/05/24 0725   Level of Care: Level of care: Progressive Family Communication: Updated patient's mother at the bedside today Disposition Plan:      Remains inpatient appropriate:      Procedures:  2D echo  Consultants:   Cardiology  Antimicrobials:   Anti-infectives (From admission, onward)    Start     Dose/Rate Route Frequency Ordered Stop   04/05/24 1800  azithromycin  (ZITHROMAX ) 500 mg in sodium chloride  0.9 % 250 mL IVPB  Status:  Discontinued        500 mg 250 mL/hr over 60 Minutes Intravenous Every 24 hours 04/05/24 0009 04/05/24 1425   04/05/24 1600  cefTRIAXone  (ROCEPHIN ) 2 g in sodium chloride  0.9 % 100 mL IVPB        2 g 200 mL/hr over 30 Minutes Intravenous Every 24 hours 04/05/24 0009     04/05/24 1515  azithromycin   (ZITHROMAX ) tablet 500 mg        500 mg Oral Daily 04/05/24 1425     04/04/24 1800  cefTRIAXone  (ROCEPHIN ) 2 g in sodium chloride  0.9 % 100 mL IVPB        2 g 200 mL/hr over 30 Minutes Intravenous  Once 04/04/24 1755 04/04/24 1855   04/04/24 1800  azithromycin  (ZITHROMAX ) 500 mg in sodium chloride  0.9 % 250 mL IVPB        500 mg 250 mL/hr over 60 Minutes Intravenous  Once 04/04/24 1755 04/04/24 2002          Medications  atorvastatin   40 mg Oral q1800   azithromycin   500 mg Oral Daily   guaiFENesin   600 mg Oral BID   insulin  aspart  0-9 Units Subcutaneous Q4H   midodrine   5 mg Oral TID   sodium chloride  flush  3 mL Intravenous Q12H   sodium chloride  flush  3 mL Intravenous Q12H      Subjective:   Tony Jones was seen and examined today.  On 9L HFNC but no increased WOB, acute chest pain or SOB.  Per mother at the bedside, feeling better and having excellent diuresis.    Objective:   Vitals:   04/06/24 2300 04/07/24 0302 04/07/24 0306 04/07/24 0720  BP: 115/72  106/86 107/82  Pulse: 80  94 96  Resp: 17  17 16   Temp: 98.5 F (36.9 C)  98.2 F (36.8 C) 98.2 F (36.8 C)  TempSrc: Oral  Oral Oral  SpO2: 94%  99% 94%  Weight:  85.4 kg    Height:        Intake/Output Summary (Last 24 hours) at 04/07/2024 1109 Last data filed at 04/07/2024 1033 Gross per 24 hour  Intake 910.38 ml  Output 4250 ml  Net -3339.62 ml     Wt Readings from Last 3 Encounters:  04/07/24 85.4 kg  08/24/23 85.8 kg  07/04/23 86 kg   Physical Exam General: Alert and oriented x 3, NAD Cardiovascular: S1 S2 clear, RRR.  Respiratory: Basilar crackles Gastrointestinal: Soft, nontender, nondistended, NBS Ext: 2+ pedal edema bilaterally Neuro: no new deficits Psych: Mental status close to his baseline  Data Reviewed:  I have personally reviewed following labs    CBC Lab Results  Component Value Date   WBC 13.1 (H) 04/07/2024   RBC 5.96 (H) 04/07/2024   HGB 20.0 (H) 04/07/2024   HCT  60.4 (H) 04/07/2024   MCV 101.3 (H) 04/07/2024   MCH 33.6 04/07/2024   PLT 120 (L) 04/07/2024   MCHC  33.1 04/07/2024   RDW 15.7 (H) 04/07/2024   LYMPHSABS 0.8 04/04/2024   MONOABS 0.5 04/04/2024   EOSABS 0.3 04/04/2024   BASOSABS 0.0 04/04/2024     Last metabolic panel Lab Results  Component Value Date   NA 138 04/07/2024   K 4.2 04/07/2024   CL 92 (L) 04/07/2024   CO2 36 (H) 04/07/2024   BUN 31 (H) 04/07/2024   CREATININE 1.63 (H) 04/07/2024   GLUCOSE 145 (H) 04/07/2024   GFRNONAA 47 (L) 04/07/2024   GFRAA 44 (L) 01/25/2019   CALCIUM  7.8 (L) 04/07/2024   PHOS 3.4 04/07/2024   PROT 5.1 (L) 04/05/2024   ALBUMIN  2.8 (L) 04/07/2024   BILITOT 1.2 04/05/2024   ALKPHOS 94 04/05/2024   AST 29 04/05/2024   ALT 41 04/05/2024   ANIONGAP 10 04/07/2024    CBG (last 3)  Recent Labs    04/07/24 0039 04/07/24 0311 04/07/24 0720  GLUCAP 174* 135* 151*      Coagulation Profile: Recent Labs  Lab 04/04/24 1740  INR 1.2     Radiology Studies: I have personally reviewed the imaging studies  DG Chest Port 1 View Result Date: 04/06/2024 CLINICAL DATA:  [id number] Dyspnea [id number] EXAM: PORTABLE CHEST - 1 VIEW COMPARISON:  April 04, 2024 FINDINGS: Similar diffuse interstitial opacities throughout both lungs. No focal airspace consolidation, pleural effusion, or pneumothorax. Moderate cardiomegaly. Tortuous aorta. No acute fracture or destructive lesions. Multilevel thoracic osteophytosis. Right upper quadrant surgical clips. IMPRESSION: Redemonstrated cardiomegaly with little significant interval change to the diffuse interstitial edema. Electronically Signed   By: Rogelia Myers M.D.   On: 04/06/2024 09:44   US  EKG SITE RITE Result Date: 04/05/2024 If Site Rite image not attached, placement could not be confirmed due to current cardiac rhythm.  ECHOCARDIOGRAM COMPLETE Result Date: 04/05/2024    ECHOCARDIOGRAM REPORT   Patient Name:   Tony Jones Date of Exam: 04/05/2024 Medical  Rec #:  [id number]    Height:       61.0 in Accession #:    [id number]   Weight:       189.6 lb Date of Birth:  [birth year]     BSA:          1.846 m Patient Age:    63 years     BP:           98/76 mmHg Patient Gender: M            HR:           87 bpm. Exam Location:  Inpatient Procedure: 2D Echo, Cardiac Doppler, Color Doppler and PEDOF (Both Spectral and            Color Flow Doppler were utilized during procedure). Indications:    CHF- Acute Diastolic I50.31  History:        Patient has prior history of Echocardiogram examinations, most                 recent 11/23/2018. Pulmonary HTN, Signs/Symptoms:Edema and                 Dyspnea; Risk Factors:Diabetes, Dyslipidemia, Hypertension and                 CKD.  Sonographer:    Koleen Popper RDCS Referring Phys: [id number] ANASTASSIA DOUTOVA IMPRESSIONS  1. Left ventricular ejection fraction, by estimation, is 60 to 65%. The left ventricle has normal function. The left ventricle has no regional wall motion abnormalities. There is mild  concentric left ventricular hypertrophy. Left ventricular diastolic parameters are consistent with Grade II diastolic dysfunction (pseudonormalization). There is the interventricular septum is flattened in systole and diastole, consistent with right ventricular pressure and volume overload.  2. Right ventricular systolic function is normal. The right ventricular size is severely enlarged. Moderately increased right ventricular wall thickness. There is severely elevated pulmonary artery systolic pressure. The estimated right ventricular systolic pressure is 80.6 mmHg.  3. Right atrial size was mildly dilated.  4. The mitral valve is normal in structure. No evidence of mitral valve regurgitation. No evidence of mitral stenosis.  5. The aortic valve is calcified. Aortic valve regurgitation is not visualized. Mild aortic valve stenosis. Aortic valve mean gradient measures 12.5 mmHg. Aortic valve Vmax measures 2.45 m/s.  6. The inferior vena cava  is dilated in size with <50% respiratory variability, suggesting right atrial pressure of 15 mmHg. Comparison(s): Changes from prior study are noted. RV is now severely enlarged and there is evidence of RV volume and pressure overload. Mild aortic stenosis is now seen as well. FINDINGS  Left Ventricle: Left ventricular ejection fraction, by estimation, is 60 to 65%. The left ventricle has normal function. The left ventricle has no regional wall motion abnormalities. The left ventricular internal cavity size was normal in size. There is  mild concentric left ventricular hypertrophy. The interventricular septum is flattened in systole and diastole, consistent with right ventricular pressure and volume overload. Left ventricular diastolic parameters are consistent with Grade II diastolic dysfunction (pseudonormalization). Right Ventricle: The right ventricular size is severely enlarged. Moderately increased right ventricular wall thickness. Right ventricular systolic function is normal. There is severely elevated pulmonary artery systolic pressure. The tricuspid regurgitant velocity is 4.26 m/s, and with an assumed right atrial pressure of 8 mmHg, the estimated right ventricular systolic pressure is 80.6 mmHg. Left Atrium: Left atrial size was normal in size. Right Atrium: Right atrial size was mildly dilated. Pericardium: There is no evidence of pericardial effusion. Mitral Valve: The mitral valve is normal in structure. No evidence of mitral valve regurgitation. No evidence of mitral valve stenosis. Tricuspid Valve: The tricuspid valve is normal in structure. Tricuspid valve regurgitation is mild . No evidence of tricuspid stenosis. Aortic Valve: The aortic valve is calcified. Aortic valve regurgitation is not visualized. Mild aortic stenosis is present. Aortic valve mean gradient measures 12.5 mmHg. Aortic valve peak gradient measures 23.9 mmHg. Pulmonic Valve: The pulmonic valve was not well visualized. Pulmonic  valve regurgitation is mild to moderate. No evidence of pulmonic stenosis. Aorta: The aortic root is normal in size and structure. Venous: The inferior vena cava is dilated in size with less than 50% respiratory variability, suggesting right atrial pressure of 15 mmHg. IAS/Shunts: No atrial level shunt detected by color flow Doppler.  LEFT VENTRICLE PLAX 2D LVIDd:         4.60 cm     Diastology LVIDs:         2.90 cm     LV e' medial:    3.92 cm/s LV PW:         1.10 cm     LV E/e' medial:  17.1 LV IVS:        1.50 cm     LV e' lateral:   4.57 cm/s                            LV E/e' lateral: 14.7  LV Volumes (MOD) LV vol  d, MOD A4C: 51.2 ml LV vol s, MOD A4C: 19.0 ml LV SV MOD A4C:     51.2 ml RIGHT VENTRICLE             IVC RV Basal diam:  6.50 cm     IVC diam: 2.40 cm RV Mid diam:    6.00 cm RV S prime:     12.70 cm/s TAPSE (M-mode): 1.3 cm LEFT ATRIUM             Index        RIGHT ATRIUM           Index LA diam:        3.30 cm 1.79 cm/m   RA Area:     20.90 cm LA Vol (A2C):   18.5 ml 10.02 ml/m  RA Volume:   59.50 ml  32.23 ml/m LA Vol (A4C):   24.3 ml 13.16 ml/m LA Biplane Vol: 23.0 ml 12.46 ml/m  AORTIC VALVE AV Vmax:           244.50 cm/s AV Vmean:          166.000 cm/s AV VTI:            0.374 m AV Peak Grad:      23.9 mmHg AV Mean Grad:      12.5 mmHg LVOT Vmax:         108.00 cm/s LVOT Vmean:        74.200 cm/s LVOT VTI:          0.166 m LVOT/AV VTI ratio: 0.44  AORTA Ao Root diam: 3.40 cm MITRAL VALVE               TRICUSPID VALVE MV Area (PHT): 4.60 cm    TR Peak grad:   72.6 mmHg MV Decel Time: 165 msec    TR Vmax:        426.00 cm/s MV E velocity: 67.10 cm/s MV A velocity: 97.50 cm/s  SHUNTS MV E/A ratio:  0.69        Systemic VTI: 0.17 m Joelle Azobou Tonleu Electronically signed by Joelle Cedars Tonleu Signature Date/Time: 04/05/2024/1:29:49 PM    Final        Nydia Distance M.D. [facility] 04/07/2024, 11:09 AM  Available via Epic secure chat 7am-7pm After 7 pm, please refer to  night coverage provider listed on amion.

## 2024-04-07 NOTE — Progress Notes (Signed)
 Mobility Specialist Progress Note:    04/07/24 1153  Therapy Vitals  Temp (!) 97.5 F (36.4 C)  Temp Source Axillary  Pulse Rate 98  Resp 17  BP 106/70  Patient Position (if appropriate) Lying  Oxygen  Therapy  SpO2 91 %  Mobility  Activity Ambulated with assistance  Level of Assistance Contact guard assist, steadying assist  Assistive Device Front wheel walker  Distance Ambulated (ft) 10 ft  Activity Response Tolerated well  Mobility Referral Yes  Mobility visit 1 Mobility  Mobility Specialist Start Time (ACUTE ONLY) 0919  Mobility Specialist Stop Time (ACUTE ONLY) [id number]  Mobility Specialist Time Calculation (min) (ACUTE ONLY) 19 min   Pt received in bed agreeable to mobility. Pt impulsive throughout session needing min VC for redirection. Was able to ambulate around bed to chair w/o fault. Contact guard via HHA for safety. Left in chair w/ call bell and personal belongings in reach. All needs met. Chair alarm on and mother in room.  Thersia Minder Mobility Specialist  Please contact vis Secure Chat or  Rehab Office [phone]

## 2024-04-07 NOTE — Progress Notes (Signed)
  Progress Note  Patient Name: Tony Jones Ada Date of Encounter: 04/07/2024 [facility] Cardiologist: Alm Clay, MD    Interval Summary    Mom at bedside he is not verbal. She indicates he is improved with his breathing Good diuresis   Vital Signs Vitals:   04/06/24 2300 04/07/24 0302 04/07/24 0306 04/07/24 0720  BP: 115/72  106/86 107/82  Pulse: 80  94 96  Resp: 17  17 16   Temp: 98.5 F (36.9 C)  98.2 F (36.8 C) 98.2 F (36.8 C)  TempSrc: Oral  Oral Oral  SpO2: 94%  99% 94%  Weight:  85.4 kg    Height:        Intake/Output Summary (Last 24 hours) at 04/07/2024 0945 Last data filed at 04/07/2024 0900 Gross per 24 hour  Intake 670.38 ml  Output 4250 ml  Net -3579.62 ml      04/07/2024    3:02 AM 04/06/2024    4:41 AM 04/06/2024    4:00 AM  Last 3 Weights  Weight (lbs) 188 lb 3.2 oz 189 lb 9.5 oz 189 lb 9.5 oz  Weight (kg) 85.367 kg 86 kg 86 kg      Telemetry/ECG  NSR  - Personally Reviewed  Physical Exam  GEN: No acute distress. Laying in the bed with head elevated    Neck: No JVD Cardiac:  RRR, no murmurs, rubs, or gallops.  Respiratory: Crackles in bilateral lung bases, otherwise clear. Normal WOB on 9 L via [place]  GI: Soft, distended  MS: 2+ edema in BLE. Hands and legs are warm, well perfused   Assessment & Plan   Acute on chronic RV failure  Acute on chronic HFpEF  Pulm HTN  - Long standing. Looking at his previous right heart cath PAd >> PCWP with PVR [place] units 8 suggesting pulmonary arterial HTN not venous. PAPi only 3.5 and echo with severe RVE and RV hypokinesis. Echo with LVEF 60-65% and mild AS - OSA not able to tolerate CPAP on oxygen  at home  - Presented with decreased appetite, fatigue, edema, cough, worsening mentation. BNP 1097. Lactic acid within normal limits x2. He is warm on exam. LFTs normal yesterday. No evidence of shock at this time  - Continue iv lasix  120 mg bid  - with low PAPi and overall clinical condition with  development delay don't thinks he should be on miltrinone. Cr improving with diuresis 1.96-> 1.63 Lactate normal 1.2 with no signs of shock  Hypotension  - BP stable  - Continue midodrine  5 mg TID   HLD  - Continue lipitor 40 mg daily    Patient has end stage RV failure Looking at his prior right heart cath looks more like pulmonary arterial hypertension. Will likely have CHF team weight in on Monday to see if worth repeating right heart cath   For questions or updates, please contact [facility] Please consult [email or web address] for contact info under        Signed, Maude Emmer, MD

## 2024-04-07 NOTE — Plan of Care (Signed)

## 2024-04-07 NOTE — Plan of Care (Signed)
 Problem: Education: Goal: Ability to describe self-care measures that may prevent or decrease complications (Diabetes Survival Skills Education) will improve 04/07/2024 2035 by Rockie Desmond HERO, RN Outcome: Progressing 04/07/2024 2035 by Rockie Desmond HERO, RN Outcome: Progressing Goal: Individualized Educational Video(s) 04/07/2024 2035 by Rockie Desmond HERO, RN Outcome: Progressing 04/07/2024 2035 by Rockie Desmond HERO, RN Outcome: Progressing   Problem: Coping: Goal: Ability to adjust to condition or change in health will improve 04/07/2024 2035 by Rockie Desmond HERO, RN Outcome: Progressing 04/07/2024 2035 by Rockie Desmond HERO, RN Outcome: Progressing   Problem: Fluid Volume: Goal: Ability to maintain a balanced intake and output will improve 04/07/2024 2035 by Rockie Desmond HERO, RN Outcome: Progressing 04/07/2024 2035 by Rockie Desmond HERO, RN Outcome: Progressing   Problem: Health Behavior/Discharge Planning: Goal: Ability to identify and utilize available resources and services will improve 04/07/2024 2035 by Rockie Desmond HERO, RN Outcome: Progressing 04/07/2024 2035 by Rockie Desmond HERO, RN Outcome: Progressing Goal: Ability to manage health-related needs will improve 04/07/2024 2035 by Rockie Desmond HERO, RN Outcome: Progressing 04/07/2024 2035 by Rockie Desmond HERO, RN Outcome: Progressing   Problem: Metabolic: Goal: Ability to maintain appropriate glucose levels will improve 04/07/2024 2035 by Rockie Desmond HERO, RN Outcome: Progressing 04/07/2024 2035 by Rockie Desmond HERO, RN Outcome: Progressing   Problem: Nutritional: Goal: Maintenance of adequate nutrition will improve 04/07/2024 2035 by Rockie Desmond HERO, RN Outcome: Progressing 04/07/2024 2035 by Rockie Desmond HERO, RN Outcome: Progressing Goal: Progress toward achieving an optimal weight will improve 04/07/2024 2035 by Rockie Desmond HERO, RN Outcome: Progressing 04/07/2024 2035 by Rockie Desmond HERO, RN Outcome: Progressing   Problem:  Skin Integrity: Goal: Risk for impaired skin integrity will decrease 04/07/2024 2035 by Rockie Desmond HERO, RN Outcome: Progressing 04/07/2024 2035 by Rockie Desmond HERO, RN Outcome: Progressing   Problem: Tissue Perfusion: Goal: Adequacy of tissue perfusion will improve 04/07/2024 2035 by Rockie Desmond HERO, RN Outcome: Progressing 04/07/2024 2035 by Rockie Desmond HERO, RN Outcome: Progressing   Problem: Education: Goal: Knowledge of General Education information will improve Description: Including pain rating scale, medication(s)/side effects and non-pharmacologic comfort measures 04/07/2024 2035 by Rockie Desmond HERO, RN Outcome: Progressing 04/07/2024 2035 by Rockie Desmond HERO, RN Outcome: Progressing   Problem: Health Behavior/Discharge Planning: Goal: Ability to manage health-related needs will improve 04/07/2024 2035 by Rockie Desmond HERO, RN Outcome: Progressing 04/07/2024 2035 by Rockie Desmond HERO, RN Outcome: Progressing   Problem: Clinical Measurements: Goal: Ability to maintain clinical measurements within normal limits will improve 04/07/2024 2035 by Rockie Desmond HERO, RN Outcome: Progressing 04/07/2024 2035 by Rockie Desmond HERO, RN Outcome: Progressing Goal: Will remain free from infection Outcome: Progressing Goal: Diagnostic test results will improve Outcome: Progressing Goal: Respiratory complications will improve Outcome: Progressing Goal: Cardiovascular complication will be avoided Outcome: Progressing   Problem: Activity: Goal: Risk for activity intolerance will decrease Outcome: Progressing   Problem: Nutrition: Goal: Adequate nutrition will be maintained Outcome: Progressing   Problem: Coping: Goal: Level of anxiety will decrease Outcome: Progressing   Problem: Elimination: Goal: Will not experience complications related to bowel motility Outcome: Progressing Goal: Will not experience complications related to urinary retention Outcome: Progressing   Problem:  Pain Managment: Goal: General experience of comfort will improve and/or be controlled Outcome: Progressing   Problem: Safety: Goal: Ability to remain free from injury will improve Outcome: Progressing   Problem: Skin Integrity: Goal: Risk for impaired skin integrity will decrease Outcome: Progressing   Problem: Education: Goal: Ability to demonstrate management of disease process  will improve Outcome: Progressing Goal: Ability to verbalize understanding of medication therapies will improve Outcome: Progressing Goal: Individualized Educational Video(s) Outcome: Progressing   Problem: Activity: Goal: Capacity to carry out activities will improve Outcome: Progressing   Problem: Cardiac: Goal: Ability to achieve and maintain adequate cardiopulmonary perfusion will improve Outcome: Progressing

## 2024-04-08 DIAGNOSIS — N181 Chronic kidney disease, stage 1: Secondary | ICD-10-CM

## 2024-04-08 DIAGNOSIS — E669 Obesity, unspecified: Secondary | ICD-10-CM | POA: Diagnosis not present

## 2024-04-08 DIAGNOSIS — N170 Acute kidney failure with tubular necrosis: Secondary | ICD-10-CM | POA: Diagnosis not present

## 2024-04-08 DIAGNOSIS — E119 Type 2 diabetes mellitus without complications: Secondary | ICD-10-CM | POA: Diagnosis not present

## 2024-04-08 DIAGNOSIS — J9621 Acute and chronic respiratory failure with hypoxia: Secondary | ICD-10-CM | POA: Diagnosis not present

## 2024-04-08 DIAGNOSIS — L8990 Pressure ulcer of unspecified site, unspecified stage: Secondary | ICD-10-CM | POA: Insufficient documentation

## 2024-04-08 LAB — RENAL FUNCTION PANEL
Albumin: 2.7 g/dL — ABNORMAL LOW (ref 3.5–5.0)
Anion gap: 14 (ref 5–15)
BUN: 30 mg/dL — ABNORMAL HIGH (ref 8–23)
CO2: 37 mmol/L — ABNORMAL HIGH (ref 22–32)
Calcium: 8.3 mg/dL — ABNORMAL LOW (ref 8.9–10.3)
Chloride: 88 mmol/L — ABNORMAL LOW (ref 98–111)
Creatinine, Ser: 1.56 mg/dL — ABNORMAL HIGH (ref 0.61–1.24)
GFR, Estimated: 50 mL/min — ABNORMAL LOW (ref >60)
Glucose, Bld: 143 mg/dL — ABNORMAL HIGH (ref 70–99)
Phosphorus: 3.4 mg/dL (ref 2.5–4.6)
Potassium: 4.1 mmol/L (ref 3.5–5.1)
Sodium: 139 mmol/L (ref 135–145)

## 2024-04-08 LAB — CBC
HCT: 58.4 % — ABNORMAL HIGH (ref 39.0–52.0)
Hemoglobin: 19.4 g/dL — ABNORMAL HIGH (ref 13.0–17.0)
MCH: 33.2 pg (ref 26.0–34.0)
MCHC: 33.2 g/dL (ref 30.0–36.0)
MCV: 100 fL (ref 80.0–100.0)
Platelets: 86 K/uL — ABNORMAL LOW (ref 150–400)
RBC: 5.84 MIL/uL — ABNORMAL HIGH (ref 4.22–5.81)
RDW: 15.8 % — ABNORMAL HIGH (ref 11.5–15.5)
WBC: 11.9 K/uL — ABNORMAL HIGH (ref 4.0–10.5)
nRBC: 0 % (ref 0.0–0.2)

## 2024-04-08 LAB — GLUCOSE, CAPILLARY
Glucose-Capillary: 138 mg/dL — ABNORMAL HIGH (ref 70–99)
Glucose-Capillary: 311 mg/dL — ABNORMAL HIGH (ref 70–99)
Glucose-Capillary: 296 mg/dL — ABNORMAL HIGH (ref 70–99)
Glucose-Capillary: 214 mg/dL — ABNORMAL HIGH (ref 70–99)
Glucose-Capillary: 200 mg/dL — ABNORMAL HIGH (ref 70–99)

## 2024-04-08 MED ORDER — INSULIN ASPART 100 UNIT/ML IJ SOLN
0.0000 [IU] | Freq: Every day | INTRAMUSCULAR | Status: DC
  Administered 2024-04-09: 3 [IU] via SUBCUTANEOUS
  Administered 2024-04-11: 2 [IU] via SUBCUTANEOUS
  Filled 2024-04-08: qty 3
  Filled 2024-04-08: qty 2

## 2024-04-08 MED ORDER — INSULIN GLARGINE 100 UNIT/ML ~~LOC~~ SOLN
5.0000 [IU] | Freq: Every day | SUBCUTANEOUS | Status: DC
  Administered 2024-04-08: 5 [IU] via SUBCUTANEOUS
  Filled 2024-04-08 (×2): qty 0.05

## 2024-04-08 MED ORDER — INSULIN ASPART 100 UNIT/ML IJ SOLN
0.0000 [IU] | Freq: Three times a day (TID) | INTRAMUSCULAR | Status: AC
  Administered 2024-04-08 – 2024-04-09 (×2): 5 [IU] via SUBCUTANEOUS
  Administered 2024-04-09 – 2024-04-10 (×2): 3 [IU] via SUBCUTANEOUS
  Administered 2024-04-10 – 2024-04-11 (×2): 2 [IU] via SUBCUTANEOUS
  Administered 2024-04-11: 3 [IU] via SUBCUTANEOUS
  Administered 2024-04-12: 8 [IU] via SUBCUTANEOUS
  Administered 2024-04-12: 2 [IU] via SUBCUTANEOUS
  Filled 2024-04-08 (×2): qty 2
  Filled 2024-04-08: qty 5
  Filled 2024-04-08: qty 3
  Filled 2024-04-08: qty 8
  Filled 2024-04-08: qty 2
  Filled 2024-04-08: qty 3
  Filled 2024-04-08: qty 5
  Filled 2024-04-08: qty 3

## 2024-04-08 MED ORDER — HYDROCORTISONE 1 % EX CREA
TOPICAL_CREAM | Freq: Two times a day (BID) | CUTANEOUS | Status: DC
  Administered 2024-04-09: 1 via TOPICAL
  Filled 2024-04-08: qty 28

## 2024-04-08 MED ORDER — DIPHENHYDRAMINE HCL 25 MG PO CAPS: 25.0000 mg | ORAL_CAPSULE | Freq: Three times a day (TID) | ORAL | Status: DC | PRN

## 2024-04-08 MED ORDER — FUROSEMIDE 10 MG/ML IJ SOLN
120.0000 mg | Freq: Once | INTRAVENOUS | Status: AC
  Administered 2024-04-08: 120 mg via INTRAVENOUS
  Filled 2024-04-08: qty 10

## 2024-04-08 NOTE — Progress Notes (Signed)
 [facility]                                                                              Tony Jones, is a 63 y.o. male, DOB - 1960/05/13, FMW:[id number] Admit date - 04/04/2024    Outpatient Primary MD for the patient is [name], [name], DO  LOS - 4  days  Chief Complaint  Patient presents with   Shortness of Breath       Brief summary   Patient is a 63 year old male with history of chronic diastolic CHF, right heart failure, postural hypotension, HTN, dyslipidemia, morbid obesity, pulmonary hypertension developmental delay presented with cough, shortness of breath, LE edema.  Patient has not been using O2, was supposed to be on O2 at home.  Patient was found to be hypoxic by EMS, O2 sats down to 83% on 3 Jones, was placed on 12L via NRB.  Per family, has been having coughing, ongoing for the past week, productive.  Patient has been having trouble laying flat, was given prescription for Lasix  but not improving.  No fevers or chills. Was given prescription for Lasix  but not improving.  Also on Augmentin but does not seem to help. Patient also has history of right ventricular failure related to pulmonary hypertension, follows Dr. Anner with cardiology. Per family, takes Lasix  20 mg daily.  For last 5 days, Lasix  had been increased to 40 and lately even 80 mg without significant improvement  12/7:  cardiology following, on high-dose IV Lasix .  Still on 10 Jones O2 via HFNC  Assessment & Plan      Acute on chronic respiratory failure with hypoxemia (HCC) Acute on chronic diastolic CHF Pulmonary hypertension, right ventricular failure -Multi factorial, likely due to acute diastolic CHF, right heart failure pulm hypertension, possible underlying pneumonia, obesity hypoventilation.  Chest x-ray consistent with interstitial edema/pulmonary edema.  BNP 1097.5, troponin 31-31-34, D-dimer negative -  2D echo showed EF of 60 to 65%, G2 DD, interventricular septum flattened in systole and  diastolic consistent with right ventricular pressure and volume overload.  Severely elevated pulmonary artery systolic pressure 80.6 mmHg. - On high-dose IV Lasix , received 120 mg IV every 6 hours on 12/6, following cardiology recommendations today - Negative balance of 10.1 Jones, weight 189.6lbs on admission-> 184.75, still very volume overloaded.   -O2 sats 92 to 100% on 10 Jones O2 via HFNC -- Per cardiology, CHF team to evaluate on Monday    CAP (community acquired pneumonia) - Chest x-ray more consistent with pulmonary edema, no infiltrates.  BNP elevated - Procalcitonin 0.12, lactic acid 1.2.  Productive cough - Continue nebs, IV Zithromax , Rocephin  x 5 days -  pulmonary hygiene, incentive spirometry   History of postural hypotension - Patient has been taking midodrine  5 mg 3 times daily, will continue  Diabetes mellitus type 2 - hemoglobin A1c 8.8 CBG (last 3)  Recent Labs    04/07/24 2345 04/08/24 0349 04/08/24 0840  GLUCAP 172* 138* 311*   Change to moderate SSI before every meal and at bedtime Added Semglee  5 units at bedtime  CKD stage 3b - Baseline creatinine 1.6-2 - Currently creatinine  within baseline.  Dyslipidemia - Continue Lipitor  Obesity class II Estimated body mass index is 34.91 kg/m as calculated from the following:   Height as of this encounter: 5' 1 (1.549 m).   Weight as of this encounter: 83.8 kg.  Code Status: Full code DVT Prophylaxis:  SCDs Start: 04/05/24 0725   Level of Care: Level of care: Progressive Family Communication: Updated patient's mother at the bedside today Disposition Plan:      Remains inpatient appropriate:      Procedures:  2D echo  Consultants:   Cardiology  Antimicrobials:   Anti-infectives (From admission, onward)    Start     Dose/Rate Route Frequency Ordered Stop   04/05/24 1800  azithromycin  (ZITHROMAX ) 500 mg in sodium chloride  0.9 % 250 mL IVPB  Status:  Discontinued        500 mg 250 mL/hr over 60  Minutes Intravenous Every 24 hours 04/05/24 0009 04/05/24 1425   04/05/24 1600  cefTRIAXone  (ROCEPHIN ) 2 g in sodium chloride  0.9 % 100 mL IVPB        2 g 200 mL/hr over 30 Minutes Intravenous Every 24 hours 04/05/24 0009     04/05/24 1515  azithromycin  (ZITHROMAX ) tablet 500 mg        500 mg Oral Daily 04/05/24 1425     04/04/24 1800  cefTRIAXone  (ROCEPHIN ) 2 g in sodium chloride  0.9 % 100 mL IVPB        2 g 200 mL/hr over 30 Minutes Intravenous  Once 04/04/24 1755 04/04/24 1855   04/04/24 1800  azithromycin  (ZITHROMAX ) 500 mg in sodium chloride  0.9 % 250 mL IVPB        500 mg 250 mL/hr over 60 Minutes Intravenous  Once 04/04/24 1755 04/04/24 2002          Medications  atorvastatin   40 mg Oral q1800   azithromycin   500 mg Oral Daily   guaiFENesin   600 mg Oral BID   insulin  aspart  0-9 Units Subcutaneous Q4H   midodrine   5 mg Oral TID   sodium chloride  flush  3 mL Intravenous Q12H   sodium chloride  flush  3 mL Intravenous Q12H      Subjective:   Tony Jones was seen and examined today.  Still very volume overloaded, on 10 Jones O2 via HFNC, 2+ pitting edema.  No acute chest pain, increased work of breathing.  Sitting up in the chair with mother at the bedside.   Objective:   Vitals:   04/07/24 2005 04/07/24 2346 04/08/24 0351 04/08/24 0729  BP: 127/83 113/80 118/87   Pulse: 89 94 95   Resp: 18 16 17 18   Temp: 97.8 F (36.6 C) (!) 97.5 F (36.4 C) (!) 97 F (36.1 C) (!) 97 F (36.1 C)  TempSrc: Oral Oral Axillary Oral  SpO2: 100% 94% 92%   Weight:   83.8 kg   Height:        Intake/Output Summary (Last 24 hours) at 04/08/2024 1014 Last data filed at 04/08/2024 0840 Gross per 24 hour  Intake 882 ml  Output 3350 ml  Net -2468 ml     Wt Readings from Last 3 Encounters:  04/08/24 83.8 kg  08/24/23 85.8 kg  07/04/23 86 kg    Physical Exam General: Alert and oriented, close to his baseline mental status Cardiovascular: S1 S2 clear, RRR.  Respiratory: Bibasilar  crackles Gastrointestinal: Soft, nontender, nondistended, NBS Ext: 2+ pedal edema bilaterally Neuro: no new deficits Psych: Appears at baseline  Data Reviewed:  I have personally reviewed following labs    CBC Lab Results  Component Value Date   WBC 11.9 (H) 04/08/2024   RBC 5.84 (H) 04/08/2024   HGB 19.4 (H) 04/08/2024   HCT 58.4 (H) 04/08/2024   MCV 100.0 04/08/2024   MCH 33.2 04/08/2024   PLT 86 (Jones) 04/08/2024   MCHC 33.2 04/08/2024   RDW 15.8 (H) 04/08/2024   LYMPHSABS 0.8 04/04/2024   MONOABS 0.5 04/04/2024   EOSABS 0.3 04/04/2024   BASOSABS 0.0 04/04/2024     Last metabolic panel Lab Results  Component Value Date   NA 139 04/08/2024   K 4.1 04/08/2024   CL 88 (Jones) 04/08/2024   CO2 37 (H) 04/08/2024   BUN 30 (H) 04/08/2024   CREATININE 1.56 (H) 04/08/2024   GLUCOSE 143 (H) 04/08/2024   GFRNONAA 50 (Jones) 04/08/2024   GFRAA 44 (Jones) 01/25/2019   CALCIUM  8.3 (Jones) 04/08/2024   PHOS 3.4 04/08/2024   PROT 5.1 (Jones) 04/05/2024   ALBUMIN  2.7 (Jones) 04/08/2024   BILITOT 1.2 04/05/2024   ALKPHOS 94 04/05/2024   AST 29 04/05/2024   ALT 41 04/05/2024   ANIONGAP 14 04/08/2024    CBG (last 3)  Recent Labs    04/07/24 2345 04/08/24 0349 04/08/24 0840  GLUCAP 172* 138* 311*      Coagulation Profile: Recent Labs  Lab 04/04/24 1740  INR 1.2     Radiology Studies: I have personally reviewed the imaging studies  No results found.      Nydia Distance M.D. [facility] 04/08/2024, 10:14 AM  Available via Epic secure chat 7am-7pm After 7 pm, please refer to night coverage provider listed on amion.

## 2024-04-08 NOTE — Progress Notes (Signed)
  Progress Note  Patient Name: [name] Date of Encounter: 04/08/2024 [facility] Cardiologist: Alm Clay, MD    Interval Summary    More alert. Continues good diuresis Brother with him in am as he stays the night And mom comes during day   Vital Signs Vitals:   04/07/24 2346 04/08/24 0351 04/08/24 0729 04/08/24 1116  BP: 113/80 118/87  (!) 87/66  Pulse: 94 95    Resp: 16 17 18 18   Temp: (!) 97.5 F (36.4 C) (!) 97 F (36.1 C) (!) 97 F (36.1 C)   TempSrc: Oral Axillary Oral Oral  SpO2: 94% 92%    Weight:  83.8 kg    Height:        Intake/Output Summary (Last 24 hours) at 04/08/2024 1435 Last data filed at 04/08/2024 0840 Gross per 24 hour  Intake 402 ml  Output 3350 ml  Net -2948 ml      04/08/2024    3:51 AM 04/07/2024    3:02 AM 04/06/2024    4:41 AM  Last 3 Weights  Weight (lbs) 184 lb 11.9 oz 188 lb 3.2 oz 189 lb 9.5 oz  Weight (kg) 83.8 kg 85.367 kg 86 kg      Telemetry/ECG  NSR  - Personally Reviewed  Physical Exam  GEN: No acute distress. Laying in the bed with head elevated    Neck: No JVD Cardiac:  RRR, no murmurs, rubs, or gallops.  Respiratory: Crackles in bilateral lung bases, otherwise clear. Normal WOB on 9 L via [place]  GI: Soft, distended  MS: 2+ edema in BLE. Hands and legs are warm, well perfused   Assessment & Plan   Acute on chronic RV failure  Acute on chronic HFpEF  Pulm HTN  - Long standing. Looking at his previous right heart cath PAd >> PCWP with PVR [place] units 8 suggesting pulmonary arterial HTN not venous. PAPi only 3.5 and echo with severe RVE and RV hypokinesis. Echo with LVEF 60-65% and mild AS - OSA not able to tolerate CPAP on oxygen  at home  - Presented with decreased appetite, fatigue, edema, cough, worsening mentation. BNP 1097. Lactic acid within normal limits x2. He is warm on exam. LFTs normal yesterday. No evidence of shock at this time  - Continue iv lasix  120 mg bid Diuresed another 2.8 L's yesterday K  4.1 and Cr stable and a bit improved at 1.56 now  - with low PAPi and overall clinical condition with development delay don't thinks he should be on miltrinone. Cr improving with diuresis 1.96-> 1.63 Lactate normal 1.2 with no signs of shock  Hypotension  - BP stable 100 systolic when I was in room sitting up in chair  - Continue midodrine  5 mg TID   HLD  - Continue lipitor 40 mg daily    Patient has end stage RV failure Looking at his prior right heart cath looks more like pulmonary arterial hypertension. Will likely have CHF team weight in on Monday to see if worth repeating right heart cath   For questions or updates, please contact [facility] Please consult [email or web address] for contact info under        Signed, Maude Emmer, MD

## 2024-04-08 NOTE — Plan of Care (Signed)

## 2024-04-08 NOTE — Progress Notes (Signed)
 Mobility Specialist Progress Note:    04/08/24 1100  Mobility  Activity Ambulated with assistance  Level of Assistance Contact guard assist, steadying assist  Assistive Device Other (Comment) (HHA)  Distance Ambulated (ft) 84 ft  Activity Response Tolerated well  Mobility Referral Yes  Mobility visit 1 Mobility  Mobility Specialist Start Time (ACUTE ONLY) 1040  Mobility Specialist Stop Time (ACUTE ONLY) 1055  Mobility Specialist Time Calculation (min) (ACUTE ONLY) 15 min   Pt received in bed agreeable to mobility. No physical assistance needed throughout just contact guard d/t unsteadiness and mild impulsiveness. Ambulated on 10L/min SPO2 ranged from 93%-97%. Returned to room w/o fault. Left in recliner w/ call bell and personal belongings in reach. All needs met. Mother in room. RN aware. Left on 10L/min.  Thersia Minder Mobility Specialist  Please contact vis Secure Chat or  Rehab Office [phone]

## 2024-04-09 ENCOUNTER — Encounter (HOSPITAL_COMMUNITY): Payer: Self-pay | Admitting: Cardiovascular Disease

## 2024-04-09 ENCOUNTER — Encounter (HOSPITAL_COMMUNITY): Admission: EM | Disposition: A | Payer: Self-pay | Source: Home / Self Care | Attending: Internal Medicine

## 2024-04-09 HISTORY — PX: RIGHT HEART CATH: CATH118263

## 2024-04-09 LAB — RENAL FUNCTION PANEL
Albumin: 2.3 g/dL — ABNORMAL LOW (ref 3.5–5.0)
Anion gap: 7 (ref 5–15)
BUN: 43 mg/dL — ABNORMAL HIGH (ref 8–23)
CO2: 41 mmol/L — ABNORMAL HIGH (ref 22–32)
Calcium: 7.9 mg/dL — ABNORMAL LOW (ref 8.9–10.3)
Chloride: 90 mmol/L — ABNORMAL LOW (ref 98–111)
Creatinine, Ser: 1.92 mg/dL — ABNORMAL HIGH (ref 0.61–1.24)
GFR, Estimated: 39 mL/min — ABNORMAL LOW (ref >60)
Glucose, Bld: 130 mg/dL — ABNORMAL HIGH (ref 70–99)
Phosphorus: 4.1 mg/dL (ref 2.5–4.6)
Potassium: 4.1 mmol/L (ref 3.5–5.1)
Sodium: 138 mmol/L (ref 135–145)

## 2024-04-09 LAB — POCT I-STAT EG7
Acid-Base Excess: 13 mmol/L — ABNORMAL HIGH (ref 0.0–2.0)
Bicarbonate: 40.8 mmol/L — ABNORMAL HIGH (ref 20.0–28.0)
Calcium, Ion: 0.97 mmol/L — ABNORMAL LOW (ref 1.15–1.40)
HCT: 52 % (ref 39.0–52.0)
Hemoglobin: 17.7 g/dL — ABNORMAL HIGH (ref 13.0–17.0)
O2 Saturation: 62 %
Potassium: 3.9 mmol/L (ref 3.5–5.1)
Sodium: 138 mmol/L (ref 135–145)
TCO2: 43 mmol/L — ABNORMAL HIGH (ref 22–32)
pCO2, Ven: 62.1 mmHg — ABNORMAL HIGH (ref 44–60)
pH, Ven: 7.425 (ref 7.25–7.43)
pO2, Ven: 33 mmHg (ref 32–45)

## 2024-04-09 LAB — CULTURE, BLOOD (ROUTINE X 2)
Culture: NO GROWTH
Culture: NO GROWTH

## 2024-04-09 LAB — GLUCOSE, CAPILLARY
Glucose-Capillary: 157 mg/dL — ABNORMAL HIGH (ref 70–99)
Glucose-Capillary: 207 mg/dL — ABNORMAL HIGH (ref 70–99)
Glucose-Capillary: 129 mg/dL — ABNORMAL HIGH (ref 70–99)
Glucose-Capillary: 256 mg/dL — ABNORMAL HIGH (ref 70–99)

## 2024-04-09 LAB — CBC
HCT: 53.1 % — ABNORMAL HIGH (ref 39.0–52.0)
Hemoglobin: 17.6 g/dL — ABNORMAL HIGH (ref 13.0–17.0)
MCH: 33.3 pg (ref 26.0–34.0)
MCHC: 33.1 g/dL (ref 30.0–36.0)
MCV: 100.6 fL — ABNORMAL HIGH (ref 80.0–100.0)
Platelets: 92 K/uL — ABNORMAL LOW (ref 150–400)
RBC: 5.28 MIL/uL (ref 4.22–5.81)
RDW: 15.6 % — ABNORMAL HIGH (ref 11.5–15.5)
WBC: 10.9 K/uL — ABNORMAL HIGH (ref 4.0–10.5)
nRBC: 0 % (ref 0.0–0.2)

## 2024-04-09 SURGERY — RIGHT HEART CATH

## 2024-04-09 MED ORDER — INSULIN GLARGINE 100 UNIT/ML ~~LOC~~ SOLN
8.0000 [IU] | Freq: Every day | SUBCUTANEOUS | Status: DC
  Administered 2024-04-09 – 2024-04-11 (×3): 8 [IU] via SUBCUTANEOUS
  Filled 2024-04-09 (×4): qty 0.08

## 2024-04-09 MED ORDER — SODIUM CHLORIDE 0.9 % IV SOLN: 250.0000 mL | INTRAVENOUS | Status: DC | PRN

## 2024-04-09 MED ORDER — SODIUM CHLORIDE 0.9% FLUSH
3.0000 mL | Freq: Two times a day (BID) | INTRAVENOUS | Status: DC
  Administered 2024-04-09: 3 mL via INTRAVENOUS

## 2024-04-09 MED ORDER — HEPARIN (PORCINE) IN NACL 1000-0.9 UT/500ML-% IV SOLN
INTRAVENOUS | Status: DC | PRN
  Administered 2024-04-09: 500 mL

## 2024-04-09 MED ORDER — LIDOCAINE HCL (PF) 1 % IJ SOLN
INTRAMUSCULAR | Status: AC
  Filled 2024-04-09: qty 30

## 2024-04-09 MED ORDER — SODIUM CHLORIDE 0.9% FLUSH: 3.0000 mL | INTRAVENOUS | Status: DC | PRN

## 2024-04-09 MED ORDER — MIDODRINE HCL 5 MG PO TABS
10.0000 mg | ORAL_TABLET | Freq: Three times a day (TID) | ORAL | Status: DC
  Administered 2024-04-09 – 2024-04-12 (×9): 10 mg via ORAL
  Filled 2024-04-09 (×9): qty 2

## 2024-04-09 MED ORDER — FUROSEMIDE 10 MG/ML IJ SOLN
120.0000 mg | Freq: Two times a day (BID) | INTRAVENOUS | Status: DC
  Administered 2024-04-09 – 2024-04-10 (×3): 120 mg via INTRAVENOUS
  Filled 2024-04-09: qty 12
  Filled 2024-04-09: qty 10
  Filled 2024-04-09 (×2): qty 12

## 2024-04-09 MED ORDER — LIDOCAINE HCL (PF) 1 % IJ SOLN
INTRAMUSCULAR | Status: DC | PRN
  Administered 2024-04-09: 2 mL

## 2024-04-09 MED ORDER — ASPIRIN 81 MG PO CHEW
81.0000 mg | CHEWABLE_TABLET | ORAL | Status: AC
  Administered 2024-04-09: 81 mg via ORAL
  Filled 2024-04-09: qty 1

## 2024-04-09 SURGICAL SUPPLY — 6 items
CATH BALLN WEDGE 5F 110CM (CATHETERS) IMPLANT
GUIDEWIRE .025 260CM (WIRE) IMPLANT
PACK CARDIAC CATHETERIZATION (CUSTOM PROCEDURE TRAY) IMPLANT
SHEATH GLIDE SLENDER 4/5FR (SHEATH) IMPLANT
TRANSDUCER W/STOPCOCK (MISCELLANEOUS) IMPLANT
TUBING ART PRESS 72 MALE/FEM (TUBING) IMPLANT

## 2024-04-09 NOTE — Progress Notes (Signed)
 Mobility Specialist: Progress Note   04/09/24 1200  Mobility  Activity Ambulated with assistance  Level of Assistance Contact guard assist, steadying assist  Assistive Device None  Distance Ambulated (ft) 150 ft  Activity Response Tolerated well  Mobility Referral Yes  Mobility visit 1 Mobility  Mobility Specialist Start Time (ACUTE ONLY) 0935  Mobility Specialist Stop Time (ACUTE ONLY) 0947  Mobility Specialist Time Calculation (min) (ACUTE ONLY) 12 min    Pt received in chair on 7LO2, agreeable to mobility session. Mother present and helpful. CGA for ambulation.Titrated to 10LO2 for ambulation, SpO2 >90% throughout. Returned to room and pt put back on 7LO2. Left in chair with all needs met, call bell in reach.   Ileana Lute Mobility Specialist Please contact via SecureChat or Rehab office at [phone]

## 2024-04-09 NOTE — Progress Notes (Signed)
 Progress Note  Patient Name: Tony Jones Date of Encounter: 04/09/2024  Primary Cardiologist: Alm Clay, MD   Subjective   Mother at bedside. Patient is improving but still volume overloaded and requiring HFNC. Patient ate breakfast.  Inpatient Medications    Scheduled Meds:  atorvastatin   40 mg Oral q1800   azithromycin   500 mg Oral Daily   guaiFENesin   600 mg Oral BID   hydrocortisone  cream   Topical BID   insulin  aspart  0-15 Units Subcutaneous TID WC   insulin  aspart  0-5 Units Subcutaneous QHS   insulin  glargine  5 Units Subcutaneous QHS   midodrine   5 mg Oral TID   sodium chloride  flush  3 mL Intravenous Q12H   sodium chloride  flush  3 mL Intravenous Q12H   Continuous Infusions:  cefTRIAXone  (ROCEPHIN )  IV Stopped (04/08/24 1732)   PRN Meds: acetaminophen  **OR** acetaminophen , albuterol , diphenhydrAMINE , HYDROcodone -acetaminophen , ondansetron  **OR** ondansetron  (ZOFRAN ) IV, sodium chloride  flush, sodium chloride  flush   Vital Signs    Vitals:   04/09/24 0400 04/09/24 0405 04/09/24 0411 04/09/24 0722  BP:  (!) 96/58    Pulse:  88 88 95  Resp:  18  17  Temp:  (!) 97 F (36.1 C)  98.4 F (36.9 C)  TempSrc:  Oral  Oral  SpO2:  93% 91%   Weight: 83.8 kg     Height:        Intake/Output Summary (Last 24 hours) at 04/09/2024 0752 Last data filed at 04/09/2024 0400 Gross per 24 hour  Intake 161.96 ml  Output 2000 ml  Net -1838.04 ml   Filed Weights   04/07/24 0302 04/08/24 0351 04/09/24 0400  Weight: 85.4 kg 83.8 kg 83.8 kg    Telemetry    NSR, HR 97 - Personally Reviewed  ECG    NSR with RVH and repolarization abnormality - Personally Reviewed  Physical Exam   Physical Exam Vitals and nursing note reviewed.  Constitutional:      Appearance: Normal appearance.  HENT:     Head: Normocephalic and atraumatic.  Eyes:     Conjunctiva/sclera: Conjunctivae normal.  Neck:     Vascular: No carotid bruit.  Cardiovascular:     Rate and Rhythm:  Normal rate and regular rhythm.  Pulmonary:     Effort: Pulmonary effort is normal.     Breath sounds: Rales present.  Musculoskeletal:     Comments: 3+ bilateral lower extremity pitting edema Bilateral upper extremity edema  Skin:    Coloration: Skin is not jaundiced or pale.     Comments: Warm extremities  Neurological:     Mental Status: He is alert.      Labs    Chemistry Recent Labs  Lab 04/04/24 1609 04/04/24 1934 04/05/24 0829 04/06/24 0226 04/07/24 0245 04/08/24 0222 04/09/24 0246  NA 134*   < > 141  140   < > 138 139 138  K 3.7   < > 4.0  3.9   < > 4.2 4.1 4.1  CL 98  --  101  101   < > 92* 88* 90*  CO2 28  --  26  28   < > 36* 37* 41*  GLUCOSE 253*  --  155*  156*   < > 145* 143* 130*  BUN 47*  --  36*  37*   < > 31* 30* 43*  CREATININE 2.13*  --  1.93*  1.96*   < > 1.63* 1.56* 1.92*  CALCIUM  7.8*  --  7.9*  7.8*   < > 7.8* 8.3* 7.9*  PROT 5.3*  --  5.1*  --   --   --   --   ALBUMIN  2.7*  --  2.6*  2.6*   < > 2.8* 2.7* 2.3*  AST 30  --  29  --   --   --   --   ALT 50*  --  41  --   --   --   --   ALKPHOS 92  --  94  --   --   --   --   BILITOT 1.3*  --  1.2  --   --   --   --   GFRNONAA 34*  --  39*  38*   < > 47* 50* 39*  ANIONGAP 8  --  14  11   < > 10 14 7    < > = values in this interval not displayed.     Hematology Recent Labs  Lab 04/07/24 0245 04/08/24 0222 04/09/24 0246  WBC 13.1* 11.9* 10.9*  RBC 5.96* 5.84* 5.28  HGB 20.0* 19.4* 17.6*  HCT 60.4* 58.4* 53.1*  MCV 101.3* 100.0 100.6*  MCH 33.6 33.2 33.3  MCHC 33.1 33.2 33.1  RDW 15.7* 15.8* 15.6*  PLT 120* 86* 92*    Cardiac EnzymesNo results for input(s): TROPONINI in the last 168 hours. No results for input(s): TROPIPOC in the last 168 hours.   BNP Recent Labs  Lab 04/04/24 1609  BNP 1,097.5*     DDimer  Recent Labs  Lab 04/05/24 1654  DDIMER 0.45     Radiology    No results found.  Cardiac Studies   TTE 12//25:  1. Left ventricular ejection  fraction, by estimation, is 60 to 65%. The  left ventricle has normal function. The left ventricle has no regional  wall motion abnormalities. There is mild concentric left ventricular  hypertrophy. Left ventricular diastolic  parameters are consistent with Grade II diastolic dysfunction  (pseudonormalization). There is the interventricular septum is flattened  in systole and diastole, consistent with right ventricular pressure and  volume overload.   2. Right ventricular systolic function is normal. The right ventricular  size is severely enlarged. Moderately increased right ventricular wall  thickness. There is severely elevated pulmonary artery systolic pressure.  The estimated right ventricular  systolic pressure is 80.6 mmHg.   3. Right atrial size was mildly dilated.   4. The mitral valve is normal in structure. No evidence of mitral valve  regurgitation. No evidence of mitral stenosis.   5. The aortic valve is calcified. Aortic valve regurgitation is not  visualized. Mild aortic valve stenosis. Aortic valve mean gradient  measures 12.5 mmHg. Aortic valve Vmax measures 2.45 m/s.   6. The inferior vena cava is dilated in size with <50% respiratory  variability, suggesting right atrial pressure of 15 mmHg.   Comparison(s): Changes from prior study are noted. RV is now severely  enlarged and there is evidence of RV volume and pressure overload. Mild  aortic stenosis is now seen as well.     LHC 10/05/17 Left Main  No left main, separate ostia LAD and LCx.    Left Anterior Descending  Separate ostium of LAD. LAD has luminal irregularities.    Left Circumflex  Separate ostium of LCx. Luminal irregularities.    Right Coronary Artery  Large caliber RCA with luminal irregularities. 50% stenosis ostial PDA.      RHC 10/05/2017: Hemodynamics (mmHg)  RA 16 RV 92/16 PA 93/51 mean 67 PCWP mean 27 LV 110/21 AO 115/75  Oxygen  saturations: PA 56% AO 99%  Cardiac Output (Fick)  4.95  Cardiac Index (Fick) 2.65 PVR 8 WU PAPi 2.65   Patient Profile     63 y.o. male with a past medical history of developmental delay, pulmonary hypertension with right ventricular heart failure, chronic diastolic heart failure, severe OSA/OHS and inability to tolerate CPAP, on home O2, pulmonary arterial hypertension, hypertension however now hypotensive requiring midodrine  for which cardiology was consulted for acute heart failure exacerbation with RV failure in the setting of pulmonary hypertension. As an outpatient he was started on antibiotics for a cough and 3 weeks of decline in appetite and decline in mentation and several months of overall decline prompting him to come to the hospital.  BNP was 1097 and lactic acid normal x 2.  He has since diuresed well to a high dose Lasix  challenge.  Assessment & Plan   Acute on chronic RV failure -weight has remained stable at 83.8 kg for 2 days.  UOP 2000 mL overnight.  Creatinine increased from 1.5->1.9 despite high-dose Lasix  and he is still requiring HFNC at 8 L/min. Continue lasix  today and will perform a RHC to evaluate pulmonary pressures as he may need PHTN treatment.  Acute on chronic HFpEF Pulm arterial hypertension- will add on for RHC today to reevaluate PAP. Patient may benefit from pulmonary vasodilators.  Hypotension with prior history of hypertension- now on midodrine    Time spent coordinating care: 40 minutes     For questions or updates, please contact  [facility] Please consult [email or web address] for contact info under        Signed, Emeline Calender, DO 04/09/2024, 7:52 AM

## 2024-04-09 NOTE — Plan of Care (Signed)

## 2024-04-09 NOTE — Progress Notes (Signed)
 [facility]                                                                              Tony Jones, is a 63 y.o. male, DOB - 1960-06-15, FMW:[id number] Admit date - 04/04/2024    Outpatient Primary MD for the patient is [name], [name], DO  LOS - 5  days  Chief Complaint  Patient presents with   Shortness of Breath       Brief summary   Patient is a 63 year old male with history of chronic diastolic CHF, right heart failure, postural hypotension, HTN, dyslipidemia, morbid obesity, pulmonary hypertension developmental delay presented with cough, shortness of breath, LE edema.  Patient has not been using O2, was supposed to be on O2 at home.  Patient was found to be hypoxic by EMS, O2 sats down to 83% on 3 Jones, was placed on 12L via NRB.  Per family, has been having coughing, ongoing for the past week, productive.  Patient has been having trouble laying flat, was given prescription for Lasix  but not improving.  No fevers or chills. Was given prescription for Lasix  but not improving.  Also on Augmentin but does not seem to help. Patient also has history of right ventricular failure related to pulmonary hypertension, follows Dr. Anner with cardiology. Per family, takes Lasix  20 mg daily.  For last 5 days, Lasix  had been increased to 40 and lately even 80 mg without significant improvement  12/7:  cardiology following, on high-dose IV Lasix .  Still on 10 Jones O2 via HFNC 12/8: On IV Lasix , cardiology planning right heart cath  Assessment & Plan      Acute on chronic respiratory failure with hypoxemia (HCC) Acute on chronic diastolic CHF Pulmonary hypertension, right ventricular failure -Multi factorial, likely due to acute diastolic CHF, right heart failure pulm hypertension, possible underlying pneumonia, obesity hypoventilation.  Chest x-ray consistent with interstitial edema/pulmonary edema.  BNP 1097.5, troponin 31-31-34, D-dimer negative -  2D echo showed EF of 60 to  65%, G2 DD, interventricular septum flattened in systole and diastolic consistent with right ventricular pressure and volume overload.  Severely elevated pulmonary artery systolic pressure 80.6 mmHg. - On high-dose IV Lasix , BP soft, negative balance of 11.3 Jones  - weight 189.6lbs on admission-> 184.75, still volume overloaded .   -Wean O2 as tolerated, O2 sats 93% on 8 Jones HFNC  - Cardiology planning right heart cath tomorrow     CAP (community acquired pneumonia) - Chest x-ray more consistent with pulmonary edema, no infiltrates.  BNP elevated - Procalcitonin 0.12, lactic acid 1.2.  Productive cough - Continue nebs, IV Zithromax , Rocephin  x 5 days -  pulmonary hygiene, incentive spirometry   History of postural hypotension -Borderline BP, will increase midodrine  to 10 mg 3 times daily  Diabetes mellitus type 2 - hemoglobin A1c 8.8 CBG (last 3)  Recent Labs    04/08/24 1558 04/08/24 2107 04/09/24 0558  GLUCAP 214* 200* 157*   Change to moderate SSI before every meal and at bedtime Increased Lantus  to 8 units at bedtime  CKD stage 3b - Baseline creatinine 1.6-2 - Currently creatinine within baseline.  Dyslipidemia - Continue Lipitor  Obesity class II Estimated body mass index is 34.92 kg/m as calculated from the following:   Height as of this encounter: 5' 1 (1.549 m).   Weight as of this encounter: 83.8 kg.  Code Status: Full code DVT Prophylaxis:  SCDs Start: 04/05/24 0725   Level of Care: Level of care: Progressive Family Communication: Updated patient's mother at the bedside today Disposition Plan:      Remains inpatient appropriate:      Procedures:  2D echo  Consultants:   Cardiology  Antimicrobials:   Anti-infectives (From admission, onward)    Start     Dose/Rate Route Frequency Ordered Stop   04/05/24 1800  azithromycin  (ZITHROMAX ) 500 mg in sodium chloride  0.9 % 250 mL IVPB  Status:  Discontinued        500 mg 250 mL/hr over 60 Minutes  Intravenous Every 24 hours 04/05/24 0009 04/05/24 1425   04/05/24 1600  cefTRIAXone  (ROCEPHIN ) 2 g in sodium chloride  0.9 % 100 mL IVPB        2 g 200 mL/hr over 30 Minutes Intravenous Every 24 hours 04/05/24 0009     04/05/24 1515  azithromycin  (ZITHROMAX ) tablet 500 mg        500 mg Oral Daily 04/05/24 1425     04/04/24 1800  cefTRIAXone  (ROCEPHIN ) 2 g in sodium chloride  0.9 % 100 mL IVPB        2 g 200 mL/hr over 30 Minutes Intravenous  Once 04/04/24 1755 04/04/24 1855   04/04/24 1800  azithromycin  (ZITHROMAX ) 500 mg in sodium chloride  0.9 % 250 mL IVPB        500 mg 250 mL/hr over 60 Minutes Intravenous  Once 04/04/24 1755 04/04/24 2002          Medications  aspirin   81 mg Oral Pre-Cath   atorvastatin   40 mg Oral q1800   azithromycin   500 mg Oral Daily   guaiFENesin   600 mg Oral BID   hydrocortisone  cream   Topical BID   insulin  aspart  0-15 Units Subcutaneous TID WC   insulin  aspart  0-5 Units Subcutaneous QHS   insulin  glargine  5 Units Subcutaneous QHS   midodrine   5 mg Oral TID   sodium chloride  flush  3 mL Intravenous Q12H   sodium chloride  flush  3 mL Intravenous Q12H   sodium chloride  flush  3 mL Intravenous Q12H      Subjective:   Tony Jones was seen and examined today.  On 8 Jones O2 via HFNC, still volume overloaded, cardiology planning right heart cath in AM.  2+ pitting edema.  No increased WOB, eating breakfast without difficulty, mother at the bedside.  Objective:   Vitals:   04/09/24 0400 04/09/24 0405 04/09/24 0411 04/09/24 0722  BP:  (!) 96/58    Pulse:  88 88 95  Resp:  18  17  Temp:  (!) 97 F (36.1 C)  98.4 F (36.9 C)  TempSrc:  Oral  Oral  SpO2:  93% 91%   Weight: 83.8 kg     Height:        Intake/Output Summary (Last 24 hours) at 04/09/2024 1117 Last data filed at 04/09/2024 0400 Gross per 24 hour  Intake 161.96 ml  Output 1350 ml  Net -1188.04 ml     Wt Readings from Last 3 Encounters:  04/09/24 83.8 kg  08/24/23 85.8 kg   07/04/23 86 kg   Physical Exam General: Alert and awake, close to  his baseline mental status Cardiovascular: S1 S2 clear, RRR.  Respiratory: Diminished breath sounds at the bases Gastrointestinal: Soft, nontender, nondistended, NBS Ext: 2+ pedal edema bilaterally Neuro: no new deficits Psych: Appears at baseline    Data Reviewed:  I have personally reviewed following labs    CBC Lab Results  Component Value Date   WBC 10.9 (H) 04/09/2024   RBC 5.28 04/09/2024   HGB 17.6 (H) 04/09/2024   HCT 53.1 (H) 04/09/2024   MCV 100.6 (H) 04/09/2024   MCH 33.3 04/09/2024   PLT 92 (Jones) 04/09/2024   MCHC 33.1 04/09/2024   RDW 15.6 (H) 04/09/2024   LYMPHSABS 0.8 04/04/2024   MONOABS 0.5 04/04/2024   EOSABS 0.3 04/04/2024   BASOSABS 0.0 04/04/2024     Last metabolic panel Lab Results  Component Value Date   NA 138 04/09/2024   K 4.1 04/09/2024   CL 90 (Jones) 04/09/2024   CO2 41 (H) 04/09/2024   BUN 43 (H) 04/09/2024   CREATININE 1.92 (H) 04/09/2024   GLUCOSE 130 (H) 04/09/2024   GFRNONAA 39 (Jones) 04/09/2024   GFRAA 44 (Jones) 01/25/2019   CALCIUM  7.9 (Jones) 04/09/2024   PHOS 4.1 04/09/2024   PROT 5.1 (Jones) 04/05/2024   ALBUMIN  2.3 (Jones) 04/09/2024   BILITOT 1.2 04/05/2024   ALKPHOS 94 04/05/2024   AST 29 04/05/2024   ALT 41 04/05/2024   ANIONGAP 7 04/09/2024    CBG (last 3)  Recent Labs    04/08/24 1558 04/08/24 2107 04/09/24 0558  GLUCAP 214* 200* 157*      Coagulation Profile: Recent Labs  Lab 04/04/24 1740  INR 1.2     Radiology Studies: I have personally reviewed the imaging studies  No results found.      Nydia Distance M.D. [facility] 04/09/2024, 11:17 AM  Available via Epic secure chat 7am-7pm After 7 pm, please refer to night coverage provider listed on amion.

## 2024-04-09 NOTE — Interval H&P Note (Signed)
 History and Physical Interval Note:  04/09/2024 5:11 PM  [name]  has presented today for surgery, with the diagnosis of hp.  The various methods of treatment have been discussed with the patient and family. After consideration of risks, benefits and other options for treatment, the patient has consented to  Procedure(s): RIGHT HEART CATH (N/A) as a surgical intervention.  The patient's history has been reviewed, patient examined, no change in status, stable for surgery.  I have reviewed the patient's chart and labs.  Questions were answered to the patient's satisfaction.     Lonni Cash

## 2024-04-09 NOTE — H&P (View-Only) (Signed)
 Progress Note  Patient Name: Tony Jones Date of Encounter: 04/09/2024  Primary Cardiologist: Alm Clay, MD   Subjective   Mother at bedside. Patient is improving but still volume overloaded and requiring HFNC. Patient ate breakfast.  Inpatient Medications    Scheduled Meds:  atorvastatin   40 mg Oral q1800   azithromycin   500 mg Oral Daily   guaiFENesin   600 mg Oral BID   hydrocortisone  cream   Topical BID   insulin  aspart  0-15 Units Subcutaneous TID WC   insulin  aspart  0-5 Units Subcutaneous QHS   insulin  glargine  5 Units Subcutaneous QHS   midodrine   5 mg Oral TID   sodium chloride  flush  3 mL Intravenous Q12H   sodium chloride  flush  3 mL Intravenous Q12H   Continuous Infusions:  cefTRIAXone  (ROCEPHIN )  IV Stopped (04/08/24 1732)   PRN Meds: acetaminophen  **OR** acetaminophen , albuterol , diphenhydrAMINE , HYDROcodone -acetaminophen , ondansetron  **OR** ondansetron  (ZOFRAN ) IV, sodium chloride  flush, sodium chloride  flush   Vital Signs    Vitals:   04/09/24 0400 04/09/24 0405 04/09/24 0411 04/09/24 0722  BP:  (!) 96/58    Pulse:  88 88 95  Resp:  18  17  Temp:  (!) 97 F (36.1 C)  98.4 F (36.9 C)  TempSrc:  Oral  Oral  SpO2:  93% 91%   Weight: 83.8 kg     Height:        Intake/Output Summary (Last 24 hours) at 04/09/2024 0752 Last data filed at 04/09/2024 0400 Gross per 24 hour  Intake 161.96 ml  Output 2000 ml  Net -1838.04 ml   Filed Weights   04/07/24 0302 04/08/24 0351 04/09/24 0400  Weight: 85.4 kg 83.8 kg 83.8 kg    Telemetry    NSR, HR 97 - Personally Reviewed  ECG    NSR with RVH and repolarization abnormality - Personally Reviewed  Physical Exam   Physical Exam Vitals and nursing note reviewed.  Constitutional:      Appearance: Normal appearance.  HENT:     Head: Normocephalic and atraumatic.  Eyes:     Conjunctiva/sclera: Conjunctivae normal.  Neck:     Vascular: No carotid bruit.  Cardiovascular:     Rate and Rhythm:  Normal rate and regular rhythm.  Pulmonary:     Effort: Pulmonary effort is normal.     Breath sounds: Rales present.  Musculoskeletal:     Comments: 3+ bilateral lower extremity pitting edema Bilateral upper extremity edema  Skin:    Coloration: Skin is not jaundiced or pale.     Comments: Warm extremities  Neurological:     Mental Status: He is alert.      Labs    Chemistry Recent Labs  Lab 04/04/24 1609 04/04/24 1934 04/05/24 0829 04/06/24 0226 04/07/24 0245 04/08/24 0222 04/09/24 0246  NA 134*   < > 141  140   < > 138 139 138  K 3.7   < > 4.0  3.9   < > 4.2 4.1 4.1  CL 98  --  101  101   < > 92* 88* 90*  CO2 28  --  26  28   < > 36* 37* 41*  GLUCOSE 253*  --  155*  156*   < > 145* 143* 130*  BUN 47*  --  36*  37*   < > 31* 30* 43*  CREATININE 2.13*  --  1.93*  1.96*   < > 1.63* 1.56* 1.92*  CALCIUM  7.8*  --  7.9*  7.8*   < > 7.8* 8.3* 7.9*  PROT 5.3*  --  5.1*  --   --   --   --   ALBUMIN  2.7*  --  2.6*  2.6*   < > 2.8* 2.7* 2.3*  AST 30  --  29  --   --   --   --   ALT 50*  --  41  --   --   --   --   ALKPHOS 92  --  94  --   --   --   --   BILITOT 1.3*  --  1.2  --   --   --   --   GFRNONAA 34*  --  39*  38*   < > 47* 50* 39*  ANIONGAP 8  --  14  11   < > 10 14 7    < > = values in this interval not displayed.     Hematology Recent Labs  Lab 04/07/24 0245 04/08/24 0222 04/09/24 0246  WBC 13.1* 11.9* 10.9*  RBC 5.96* 5.84* 5.28  HGB 20.0* 19.4* 17.6*  HCT 60.4* 58.4* 53.1*  MCV 101.3* 100.0 100.6*  MCH 33.6 33.2 33.3  MCHC 33.1 33.2 33.1  RDW 15.7* 15.8* 15.6*  PLT 120* 86* 92*    Cardiac EnzymesNo results for input(s): TROPONINI in the last 168 hours. No results for input(s): TROPIPOC in the last 168 hours.   BNP Recent Labs  Lab 04/04/24 1609  BNP 1,097.5*     DDimer  Recent Labs  Lab 04/05/24 1654  DDIMER 0.45     Radiology    No results found.  Cardiac Studies   TTE 12//25:  1. Left ventricular ejection  fraction, by estimation, is 60 to 65%. The  left ventricle has normal function. The left ventricle has no regional  wall motion abnormalities. There is mild concentric left ventricular  hypertrophy. Left ventricular diastolic  parameters are consistent with Grade II diastolic dysfunction  (pseudonormalization). There is the interventricular septum is flattened  in systole and diastole, consistent with right ventricular pressure and  volume overload.   2. Right ventricular systolic function is normal. The right ventricular  size is severely enlarged. Moderately increased right ventricular wall  thickness. There is severely elevated pulmonary artery systolic pressure.  The estimated right ventricular  systolic pressure is 80.6 mmHg.   3. Right atrial size was mildly dilated.   4. The mitral valve is normal in structure. No evidence of mitral valve  regurgitation. No evidence of mitral stenosis.   5. The aortic valve is calcified. Aortic valve regurgitation is not  visualized. Mild aortic valve stenosis. Aortic valve mean gradient  measures 12.5 mmHg. Aortic valve Vmax measures 2.45 m/s.   6. The inferior vena cava is dilated in size with <50% respiratory  variability, suggesting right atrial pressure of 15 mmHg.   Comparison(s): Changes from prior study are noted. RV is now severely  enlarged and there is evidence of RV volume and pressure overload. Mild  aortic stenosis is now seen as well.     LHC 10/05/17 Left Main  No left main, separate ostia LAD and LCx.    Left Anterior Descending  Separate ostium of LAD. LAD has luminal irregularities.    Left Circumflex  Separate ostium of LCx. Luminal irregularities.    Right Coronary Artery  Large caliber RCA with luminal irregularities. 50% stenosis ostial PDA.      RHC 10/05/2017: Hemodynamics (mmHg)  RA 16 RV 92/16 PA 93/51 mean 67 PCWP mean 27 LV 110/21 AO 115/75  Oxygen  saturations: PA 56% AO 99%  Cardiac Output (Fick)  4.95  Cardiac Index (Fick) 2.65 PVR 8 WU PAPi 2.65   Patient Profile     63 y.o. male with a past medical history of developmental delay, pulmonary hypertension with right ventricular heart failure, chronic diastolic heart failure, severe OSA/OHS and inability to tolerate CPAP, on home O2, pulmonary arterial hypertension, hypertension however now hypotensive requiring midodrine  for which cardiology was consulted for acute heart failure exacerbation with RV failure in the setting of pulmonary hypertension. As an outpatient he was started on antibiotics for a cough and 3 weeks of decline in appetite and decline in mentation and several months of overall decline prompting him to come to the hospital.  BNP was 1097 and lactic acid normal x 2.  He has since diuresed well to a high dose Lasix  challenge.  Assessment & Plan   Acute on chronic RV failure -weight has remained stable at 83.8 kg for 2 days.  UOP 2000 mL overnight.  Creatinine increased from 1.5->1.9 despite high-dose Lasix  and he is still requiring HFNC at 8 L/min. Continue lasix  today and will perform a RHC to evaluate pulmonary pressures as he may need PHTN treatment.  Acute on chronic HFpEF Pulm arterial hypertension- will add on for RHC today to reevaluate PAP. Patient may benefit from pulmonary vasodilators.  Hypotension with prior history of hypertension- now on midodrine    Time spent coordinating care: 40 minutes     For questions or updates, please contact  [facility] Please consult [email or web address] for contact info under        Signed, Emeline Calender, DO 04/09/2024, 7:52 AM

## 2024-04-09 NOTE — TOC Progression Note (Signed)
 Transition of Care [facility]) - Progression Note    Patient Details  Name: Tony Jones MRN: [id number] Date of Birth: 1960/10/04  [facility] CM/SW Contact  Waddell Barnie Rama, RN Phone Number: 04/09/2024, 1:06 PM  Clinical Narrative:    NCM received a call from Randine at [facility],  patient is for a procedure tomorrow.  ICM will cont to follow.                    Expected Discharge Plan and Services                                               Social Drivers of Health (SDOH) Interventions SDOH Screenings   Food Insecurity: No Food Insecurity (04/05/2024)  Housing: Low Risk  (04/05/2024)  Transportation Needs: No Transportation Needs (04/05/2024)  Utilities: Not At Risk (04/05/2024)  Tobacco Use: Low Risk  (04/06/2024)    Readmission Risk Interventions    04/06/2024    1:27 PM  Readmission Risk Prevention Plan  Transportation Screening Complete  Home Care Screening Complete  Medication Review (RN CM) Complete

## 2024-04-10 ENCOUNTER — Other Ambulatory Visit (HOSPITAL_COMMUNITY): Payer: Self-pay

## 2024-04-10 DIAGNOSIS — I2510 Atherosclerotic heart disease of native coronary artery without angina pectoris: Secondary | ICD-10-CM

## 2024-04-10 DIAGNOSIS — I5032 Chronic diastolic (congestive) heart failure: Secondary | ICD-10-CM

## 2024-04-10 DIAGNOSIS — N183 Chronic kidney disease, stage 3 unspecified: Secondary | ICD-10-CM

## 2024-04-10 LAB — RENAL FUNCTION PANEL
Albumin: 2.5 g/dL — ABNORMAL LOW (ref 3.5–5.0)
Anion gap: 13 (ref 5–15)
BUN: 56 mg/dL — ABNORMAL HIGH (ref 8–23)
CO2: 36 mmol/L — ABNORMAL HIGH (ref 22–32)
Calcium: 7.7 mg/dL — ABNORMAL LOW (ref 8.9–10.3)
Chloride: 89 mmol/L — ABNORMAL LOW (ref 98–111)
Creatinine, Ser: 1.98 mg/dL — ABNORMAL HIGH (ref 0.61–1.24)
GFR, Estimated: 37 mL/min — ABNORMAL LOW (ref >60)
Glucose, Bld: 105 mg/dL — ABNORMAL HIGH (ref 70–99)
Phosphorus: 4.2 mg/dL (ref 2.5–4.6)
Potassium: 3.6 mmol/L (ref 3.5–5.1)
Sodium: 138 mmol/L (ref 135–145)

## 2024-04-10 LAB — GLUCOSE, CAPILLARY
Glucose-Capillary: 111 mg/dL — ABNORMAL HIGH (ref 70–99)
Glucose-Capillary: 191 mg/dL — ABNORMAL HIGH (ref 70–99)
Glucose-Capillary: 127 mg/dL — ABNORMAL HIGH (ref 70–99)
Glucose-Capillary: 160 mg/dL — ABNORMAL HIGH (ref 70–99)

## 2024-04-10 LAB — CBC
HCT: 56.6 % — ABNORMAL HIGH (ref 39.0–52.0)
Hemoglobin: 18.5 g/dL — ABNORMAL HIGH (ref 13.0–17.0)
MCH: 33.3 pg (ref 26.0–34.0)
MCHC: 32.7 g/dL (ref 30.0–36.0)
MCV: 101.8 fL — ABNORMAL HIGH (ref 80.0–100.0)
Platelets: 117 K/uL — ABNORMAL LOW (ref 150–400)
RBC: 5.56 MIL/uL (ref 4.22–5.81)
RDW: 15.6 % — ABNORMAL HIGH (ref 11.5–15.5)
WBC: 11.7 K/uL — ABNORMAL HIGH (ref 4.0–10.5)
nRBC: 0 % (ref 0.0–0.2)

## 2024-04-10 LAB — MAGNESIUM: Magnesium: 2.4 mg/dL (ref 1.7–2.4)

## 2024-04-10 MED ORDER — POTASSIUM CHLORIDE CRYS ER 20 MEQ PO TBCR
40.0000 meq | EXTENDED_RELEASE_TABLET | Freq: Once | ORAL | Status: AC
  Administered 2024-04-10: 40 meq via ORAL
  Filled 2024-04-10: qty 2

## 2024-04-10 MED ORDER — SILDENAFIL CITRATE 20 MG PO TABS
20.0000 mg | ORAL_TABLET | Freq: Three times a day (TID) | ORAL | Status: DC
  Administered 2024-04-10 (×3): 20 mg via ORAL
  Filled 2024-04-10 (×4): qty 1

## 2024-04-10 MED ORDER — ENOXAPARIN SODIUM 40 MG/0.4ML IJ SOSY
40.0000 mg | PREFILLED_SYRINGE | Freq: Every day | INTRAMUSCULAR | Status: DC
  Administered 2024-04-10 – 2024-04-11 (×2): 40 mg via SUBCUTANEOUS
  Filled 2024-04-10 (×2): qty 0.4

## 2024-04-10 NOTE — Progress Notes (Signed)
 Orthopedic Tech Progress Note Patient Details:  Tony Jones [birth year] [id number]  Ortho Devices Type of Ortho Device: Radio broadcast assistant Ortho Device/Splint Location: BLE Ortho Device/Splint Interventions: Ordered, Application   Post Interventions Patient Tolerated: Well  Motty Borin OTR/L 04/10/2024, 10:29 AM

## 2024-04-10 NOTE — Consult Note (Addendum)
 Advanced Heart Failure Team Consult Note  Primary Physician: [name] Annabella CROME, DO Cardiologist:  Alm Clay, MD  Reason for Consultation: Pulmonary HTN HPI:    Tony Jones is seen today for evaluation of pulmonary hypertension at the request of Dr. .   Tony Jones is a 63 y.o. male with history of developmental delay, pulmonary hypertension with right ventricular heart failure, chronic diastolic heart failure, severe OSA/OHS and inability to tolerate CPAP, on home O2, pulmonary arterial hypertension, hypertension however now hypotensive requiring midodrine . Last seen in [facility] 07/2018. Has been followed by Dr. Neda, Pulmnology for pHTN and annually by Dr. Clay for chronic HFpEF. He [facility] in [facility].   He lives with his mother who takes care of him and helps with ADLs.  Admitted in 6/19 with acute on chronic diastolic CHF with prominent RV failure. RHC showed elevated right and left heart filling pressures with severe pulmonary arterial hypertension.  Patient is on high flow oxygen  for OHS, he cannot tolerate CPAP. He was diuresed extensively.  He had upper GI bleeding.      Admitted 8/19 with cholelithiasis without evidence for cholecystitis treated with conservative therapy.   Presented to [facility]/04/24 from [facility] [place]. Had been experiencing a cough x3 weeks, initially treated with antibiotics for pneumonia. While at home with pneumonia, his eating and drinking habits are very uncontrolled, per his father. Over the last week had decline in appetite and mentation. Has continued to struggle with CPAP use and continuous O2 during the day. On presentation hypoxic, SpO2 83% on 3L (on 4L at home) he was then placed on 12L NRB with improvement, BP 111/81, HE in 80s. ECG showed NSR. CXR showed cardiomegaly and congestion. Labs in ED notable for WBC 11.8, hgb 19.5, Cr 2.13, BNP 1097, hs-trop 31>31>34. TTE showed EF 60-65%, mild LVH, G2DD, nl RV functoin, RVSP 80.6 mmHg, mild AS  with meanG 12.5 mmHg, Vmax 2.5 m/s, dilated IVC. Cardiology was consulted, he has been diuresing well with high dose IV Lasix . Midodrine  has been increased due to persistent hypotension.  He underwent RHC 04/09/24 showing severe pulmonary hypertension with normal filling pressures and low cardiac output. (RA 6, PA 95/41 (60), PCWP 11, CO/CI (fick) 3.3/1.85, PVR 14.47)  Today, he is weaned from HFNC to oxymizer at 8L. BP remains soft, SBP 70-90s. Overall net negative 12L, although weight only down 6 lbs. WBC remain stable 11.7 on azithromycin  and rocephin . sCr stable 1.7-2.0. ROS comes from patient and father, no SOB, CP or abdominal pain. Patient frustrated by being in hospital.  Home Medications Prior to Admission medications   Medication Sig Start Date End Date Taking? Authorizing Provider  allopurinol  (ZYLOPRIM ) 300 MG tablet Take 300 mg by mouth in the morning. 01/18/15  Yes [provider]  amoxicillin-clavulanate (AUGMENTIN) 875-125 MG tablet Take 1 tablet by mouth 2 (two) times daily.   Yes [provider]  atorvastatin  (LIPITOR) 40 MG tablet Take 1 tablet (40 mg total) by mouth daily at 6 PM. Patient taking differently: Take 40 mg by mouth at bedtime. 10/13/17  Yes Lesia Ozell Barter, PA-C  citalopram  (CELEXA ) 20 MG tablet Take 20 mg by mouth in the morning.   Yes [provider]  dextromethorphan-guaiFENesin  (ROBITUSSIN-DM) 10-100 MG/5ML liquid Take 10 mLs by mouth every 4 (four) hours as needed for cough.   Yes [provider]  diazepam (VALIUM) 2 MG tablet Take 2 mg by mouth See admin instructions. Take 1  tablet (2mg ) by mouth as directed per [facility].   Yes [provider]  Emollient (LUBRIDERM ADVANCED THERAPY) CREA Apply 1 Application topically 2 (two) times daily as needed (eczema, dry skin).   Yes [provider]  furosemide  (LASIX ) 20 MG tablet Take 20 mg by mouth 2 (two) times daily.   Yes [provider]   Iron-Vitamin C (VITRON-C) 65-125 MG TABS Take 1 tablet by mouth in the morning.   Yes [provider]  levocetirizine (XYZAL ) 5 MG tablet Take 5 mg by mouth See admin instructions. Take 1 tablet (5mg ) by mouth every other evening.   Yes [provider]  midodrine  (PROAMATINE ) 5 MG tablet Take 1 tablet (5 mg total) by mouth See admin instructions. Take 1 tablet (5 mg) by mouth 3 times daily, except take an additional tablet (5 mg) by mouth at midday on center days to maintain adequate blood pressure. Patient taking differently: Take 5 mg by mouth 3 (three) times daily. 08/27/19  Yes Anner Alm ORN, MD  Multiple Vitamin (MULTIVITAMIN) tablet Take 1 tablet by mouth in the morning.   Yes [provider]  pantoprazole  (PROTONIX ) 40 MG tablet Take 40 mg by mouth 2 (two) times daily.   Yes [provider]  potassium chloride  SA (K-DUR,KLOR-CON ) 20 MEQ tablet Take 1 tablet (20 mEq total) by mouth daily. 10/14/17  Yes Lesia Ozell Barter, PA-C  senna-docusate (SENOKOT-S) 8.6-50 MG tablet Take 2 tablets by mouth at bedtime.   Yes [provider]  triamcinolone  lotion (KENALOG ) 0.1 % Apply 1 Application topically daily as needed (itching).   Yes [provider]  white petrolatum (VASELINE) ointment Apply 1 application  topically 2 (two) times daily as needed for dry skin (itching).   Yes [provider]  ipratropium-albuterol  (DUONEB) 0.5-2.5 (3) MG/3ML SOLN Take 3 mLs by nebulization every 6 (six) hours. Patient not taking: Reported on 04/07/2024    [provider]    Past Medical History: Past Medical History:  Diagnosis Date   Anemia    Anxiety    Barrett esophagus    Cholelithiasis    Chronic respiratory failure with hypoxia (HCC)    CKD (chronic kidney disease) stage 3, GFR 30-59 ml/min (HCC)    Developmental delay    Diabetes mellitus type 2, uncontrolled    Dyslipidemia    Gallstones 01/2019   GERD (gastroesophageal  reflux disease)    Hypertension    Mental disorder    Chronic developmental delay.; still lives with mother as primary care giver; spends 2-3 days in adult day-care.   Nephrolithiasis    Pulmonary hypertension (HCC)    Likely related to long-standing hypoxia and possible OSA/OHS (group 3) & ~DHF (Group 2); -- No longer on Pulmnoary Vasodilator (family request)   Right heart failure [facility])    Recovered as on July 2020    Past Surgical History: Past Surgical History:  Procedure Laterality Date   BIOPSY  10/25/2022   Procedure: BIOPSY;  Surgeon: Rosalie Kitchens, MD;  Location: [facility];  Service: Gastroenterology;;   CHOLECYSTECTOMY N/A 01/24/2019   Procedure: LAPAROSCOPIC CHOLECYSTECTOMY WITH INTRAOPERATIVE CHOLANGIOGRAM;  Surgeon: Belinda Cough, MD;  Location: [facility] OR;  Service: General;  Laterality: N/A;   CYSTOSCOPY/URETEROSCOPY/HOLMIUM LASER/STENT PLACEMENT Right 11/14/2017   Procedure: CYSTOSCOPY/URETEROSCOPY/RETROGRADE/STENT PLACEMENT;  Surgeon: Matilda Senior, MD;  Location: [facility];  Service: Urology;  Laterality: Right;  75 MINS  SPINAL OR MAC ANESTHESIA  ALSO EXTRACTION OF STONE   ESOPHAGOGASTRODUODENOSCOPY N/A 08/02/2017  Procedure: ESOPHAGOGASTRODUODENOSCOPY (EGD);  Surgeon: Rosalie Kitchens, MD;  Location: [facility];  Service: Endoscopy;  Laterality: N/A;   ESOPHAGOGASTRODUODENOSCOPY N/A 10/25/2022   Procedure: ESOPHAGOGASTRODUODENOSCOPY (EGD);  Surgeon: Rosalie Kitchens, MD;  Location: [facility];  Service: Gastroenterology;  Laterality: N/A;   GIVENS CAPSULE STUDY N/A 10/12/2017   Procedure: GIVENS CAPSULE STUDY;  Surgeon: Rosalie Kitchens, MD;  Location: [facility] ENDOSCOPY;  Service: Endoscopy;  Laterality: N/A;   POLYPECTOMY  10/25/2022   Procedure: POLYPECTOMY;  Surgeon: Rosalie Kitchens, MD;  Location: [facility];  Service: Gastroenterology;;   RIGHT HEART CATH N/A 04/09/2024   Procedure: RIGHT HEART CATH;  Surgeon: Verlin Lonni BIRCH, MD;  Location: [facility] INVASIVE CV LAB;  Service:  Cardiovascular;  Laterality: N/A;   RIGHT/LEFT HEART CATH AND CORONARY ANGIOGRAPHY N/A 10/05/2017   Procedure: RIGHT/LEFT HEART CATH AND CORONARY ANGIOGRAPHY;  Surgeon: Rolan Ezra RAMAN, MD =  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 (R>L). RAP 16 mmHg, RVP 92/16 mmHg; PAP 93/51 mmHg mean 67, PCWP mn27 mmHg. LVP 110/21 - EDP 21 mmHg. AoP 115/75 mmHg.  PaO2 56%, AoO2 99%. CO/I (Fick) 4.95-2.65, PVR 8 WU, PAPi 2.65 (PVR 8 WU)   THROAT SURGERY     TRANSTHORACIC ECHOCARDIOGRAM  01/2015   Mild concentric LVH.  EF 60 to 65%.  GR 1 DD, but high filling pressures.  IVS had diastolic and systolic flattening consistent with volume and pressure overload.  PA pressures estimated 75 mmHg.   TRANSTHORACIC ECHOCARDIOGRAM  11/2018   Normal LV size and function.  EF 55 to 60%.  Moderate basal septal hypertrophy.  GR 1 DD.  No R WMA.  Normal RV size with preserved/normal function.  Moderate aortic sclerosis but no stenosis.  Mild aortic root dilation at 38 mm.  Lipomatous IAS    Family History: Family History  Problem Relation Age of Onset   Congestive Heart Failure Father    Heart disease Father    Heart attack Father    Atrial fibrillation Father    Brain cancer Father    Melanoma Father    Diabetes Mother    Stroke Mother    Hypertension Mother    Fainting Paternal Grandfather    Congestive Heart Failure Paternal Grandfather    Atrial fibrillation Paternal Grandfather     Social History: Social History   Socioeconomic History   Marital status: Single    Spouse name: Not on file   Number of children: Not on file   Years of education: Not on file   Highest education level: Not on file  Occupational History   Occupation: unemployed  Tobacco Use   Smoking status: Never   Smokeless tobacco: Never  Vaping Use   Vaping status: Never Used  Substance and Sexual Activity   Alcohol  use: No   Drug use: No   Sexual activity: Never  Other Topics Concern   Not on file   Social History Narrative   Not on file   Social Drivers of Health   Financial Resource Strain: Not on file  Food Insecurity: No Food Insecurity (04/05/2024)   Hunger Vital Sign    Worried About Running Out of Food in the Last Year: Never true    Ran Out of Food in the Last Year: Never true  Transportation Needs: No Transportation Needs (04/05/2024)   PRAPARE - Administrator, Civil Service (Medical): No    Lack of Transportation (Non-Medical): No  Physical Activity: Not on file  Stress: Not  on file  Social Connections: Not on file    Allergies:  Allergies  Allergen Reactions   Glucophage  [Metformin ] Itching   Codeine Nausea And Vomiting    Objective:    Vital Signs:   Temp:  [98.3 F (36.8 C)-98.7 F (37.1 C)] 98.3 F (36.8 C) (12/09 0735) Pulse Rate:  [83-95] 83 (12/09 0735) Resp:  [9-26] 19 (12/09 0735) BP: (78-122)/(55-85) 99/72 (12/09 0735) SpO2:  [90 %-97 %] 91 % (12/09 0735) Weight:  [83.1 kg] 83.1 kg (12/09 0300) Last BM Date : 04/09/24  Weight change: Filed Weights   04/08/24 0351 04/09/24 0400 04/10/24 0300  Weight: 83.8 kg 83.8 kg 83.1 kg   Intake/Output:  Intake/Output Summary (Last 24 hours) at 04/10/2024 0751 Last data filed at 04/10/2024 0500 Gross per 24 hour  Intake 124 ml  Output 1150 ml  Net -1026 ml    Physical Exam    General: Well appearing. No distress  Cardiac: JVP flat. No murmurs  Resp: Lung sounds clear and equal B/L Extremities: Warm and dry.  1+ BLE edema.  Neuro: Affect flat.   Telemetry   SR 80s (personally reviewed)  Labs   Basic Metabolic Panel: Recent Labs  Lab 04/05/24 0829 04/06/24 0226 04/07/24 0245 04/08/24 0222 04/09/24 0246 04/09/24 1738 04/10/24 0224  NA 141  140 143 138 139 138 138 138  K 4.0  3.9 3.5 4.2 4.1 4.1 3.9 3.6  CL 101  101 100 92* 88* 90*  --  89*  CO2 26  28 26  36* 37* 41*  --  36*  GLUCOSE 155*  156* 110* 145* 143* 130*  --  105*  BUN 36*  37* 33* 31* 30* 43*  --  56*   CREATININE 1.93*  1.96* 1.86* 1.63* 1.56* 1.92*  --  1.98*  CALCIUM  7.9*  7.8* 7.7* 7.8* 8.3* 7.9*  --  7.7*  MG 2.3  --   --   --   --   --   --   PHOS 3.3  3.4 2.9 3.4 3.4 4.1  --  4.2    Liver Function Tests: Recent Labs  Lab 04/04/24 1609 04/05/24 0829 04/06/24 0226 04/07/24 0245 04/08/24 0222 04/09/24 0246 04/10/24 0224  AST 30 29  --   --   --   --   --   ALT 50* 41  --   --   --   --   --   ALKPHOS 92 94  --   --   --   --   --   BILITOT 1.3* 1.2  --   --   --   --   --   PROT 5.3* 5.1*  --   --   --   --   --   ALBUMIN  2.7* 2.6*  2.6* 2.5* 2.8* 2.7* 2.3* 2.5*   No results for input(s): LIPASE, AMYLASE in the last 168 hours. No results for input(s): AMMONIA in the last 168 hours.  CBC: Recent Labs  Lab 04/04/24 1609 04/04/24 1934 04/06/24 0226 04/07/24 0245 04/08/24 0222 04/09/24 0246 04/09/24 1738 04/10/24 0224  WBC 11.8*   < > 12.3* 13.1* 11.9* 10.9*  --  11.7*  NEUTROABS 10.1*  --   --   --   --   --   --   --   HGB 19.5*   < > 19.2* 20.0* 19.4* 17.6* 17.7* 18.5*  HCT 57.7*   < > 57.5* 60.4* 58.4* 53.1* 52.0 56.6*  MCV 100.2*   < >  99.7 101.3* 100.0 100.6*  --  101.8*  PLT 133*   < > 125* 120* 86* 92*  --  117*   < > = values in this interval not displayed.   BNP (last 3 results) Recent Labs    04/04/24 1609  BNP 1,097.5*   CBG: Recent Labs  Lab 04/09/24 0558 04/09/24 1117 04/09/24 1604 04/09/24 2109 04/10/24 0555  GLUCAP 157* 207* 129* 256* 111*   Imaging   CARDIAC CATHETERIZATION Result Date: 04/09/2024 RA 6 RV 93/6/11 PA 95/41 mean 60 PCWP 11 AO 92% PA 62% CO 3.3 L/min (Fick) CI 1.85 SVR 15.95 PVR 14.47   Medications:    Current Medications:  atorvastatin   40 mg Oral q1800   guaiFENesin   600 mg Oral BID   hydrocortisone  cream   Topical BID   insulin  aspart  0-15 Units Subcutaneous TID WC   insulin  aspart  0-5 Units Subcutaneous QHS   insulin  glargine  8 Units Subcutaneous QHS   midodrine   10 mg Oral TID   sodium  chloride flush  3 mL Intravenous Q12H   sodium chloride  flush  3 mL Intravenous Q12H    Infusions:  furosemide  120 mg (04/09/24 1825)   Assessment/Plan   1. Pulmonary hypertension with RV failure/cor pulmonale and Chronic Hypoxic Respiratory Failure: Suspect that the pulmonary hypertension is a combination of group 2 (diastolic CHF) and primarily group 3 PH (OHS/OSA).  He is on 4L oxygen , should wear at all times, however only wears when feels bad. He has CPAP but cannot tolerate.  High resolution CT chest 2019 was not suggestive of ILD.  V/Q scan did not show chronic PEs in 2016. Repeat RHC 12/25 with stable severe PAH 95/41 (60) from PA 93/51 (67) in 10/2017.  - Suspect exacerbated by PNA and dietary noncompliance d/t disruption in routine. - Normal filling pressures on RHC yesterday. Start PO Torsemide  tomorrow. - Unfortunately, unable to tolerate vasodilator therapy d/t hypotension - continue midodrine  10 mh tid - continue to wean O2 to home 4L (currently on 8L); he needs better compliance with O2 and CPAP at home, this is a struggle with mentation.  - place UNNA boots 2. Chronic diastolic CHF:  In addition to RV failure, last RHC showed elevated PCWP.  Echo 5/19 EF 60-65% on echo. Stable EF 12/25 EF 60-65%.   - remains with mild peripheral edema, normal filling pressures on cath - received IV Lasix  120 this am; stop and switch to torsemide  tomorrow 3. CKD: Stage 3. - baseline Cr ~2.0 - stable 4. H/o GI bleeding: Capsule negative in 6/19.  No bleeding. Hgb stable /elevated. 5. 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  - Continue atorvastatin  40 mg daily. LDL 36 - no ASA with history of GIB  Length of Stay: 6  Jordan Lee, NP  04/10/2024, 7:51 AM  Advanced Heart Failure Team Pager [phone] (M-F; 7a - 5p)  Please contact [facility] for night-coverage after hours (4p -7a ) and weekends on [email or web address]  Patient seen with NP, I formulated the plan and agree with the above note.     Patient has history of WHO group 2 and group 3 PH from severe OHS/OSA and diastolic CHF.  He is on 4L oxygen  at home at all times.  He has been unable to tolerate CPAP.   He was admitted with possible PNA and CHF exacerbation/RV failure.  Echo showed EF 60-65%, mild LVH, severe RV enlargement with D-shaped septum, PASP 81, IVC dilated, mild AS with mean gradient  13 mmHg.  RHC this admission after diuresis showed normal filling pressures but severe PAH similar to prior RHC, however CI low at 1.85 on this cath and preserved on prior cath.  Suspect progressive RV failure with ongoing severe PAH.   He is currently breathing comfortably, still on 8L [place]. He has been on midodrine  chronically for low BP.   General: NAD Neck: Thick, JVP difficult, no thyromegaly or thyroid  nodule.  Lungs: Clear to auscultation bilaterally with normal respiratory effort. CV: Nondisplaced PMI.  Heart regular S1/S2, no S3/S4, 1/6 SEM RUSB.  No peripheral edema.  No carotid bruit. Difficult to palpate pedal pulses.  Abdomen: Soft, nontender, no hepatosplenomegaly, no distention.  Skin: Intact without lesions or rashes.  Neurologic: Alert and oriented x 3.  Psych: Normal affect. Extremities: No clubbing or cyanosis.  HEENT: Normal.   1. Pulmonary hypertension with RV failure/cor pulmonale: This is longstanding.  Suspect that the pulmonary hypertension has been a combination of WHO group 2 (diastolic CHF) and primarily group 3 PH (OHS/OSA).  He is on high flow oxygen  4L at all times at home but cannot tolerate CPAP.  High resolution CT chest in past was not suggestive of ILD.  V/Q scan in past did not show chronic PEs.  He was not started on pulmonary vasodilators in the past due to concern that this was primarily WHO group 3 PH.  He has been on midodrine  long-term.  He was admitted with PNA and worsening RV failure.  Echo this admission showed EF 60-65%, mild LVH, severe RV enlargement with D-shaped septum, PASP 81, IVC  dilated, mild AS with mean gradient 13 mmHg.  Patient was diuresed, and  RHC this admission after diuresis showed normal filling pressures but severe PAH similar to prior RHC.  However, CI low at 1.85 on this cath and was preserved on prior cath.  Suspect progressive RV failure with ongoing severe PAH. Volume status now seems optimized based on RHC.  Creatinine stable 1.98.  - Would stop IV Lasix  for now.  Can start po torsemide  tomorrow.  - As PH appears to be WHO group 3 predominantly (unlikely group 1), he has not been on pulmonary vasodilators.  With worsening clinical status, I will try him on sildenafil  20 mg tid.  This is short-acting and can be stopped if BP worsens or he feels worse on it.  - Wean down oxygen  to his prior baseline. OK for oxygen  saturation >88%. 2. CKD: Stage 3. Creatinine stable at 1.98 today.  - Follow closely.  3. GI bleeding: Remote.  Capsule negative in 6/19.  No recent overt GI bleeding.   4. 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  - Continue atorvastatin  40 mg daily  Lexus Shampine 04/10/2024 9:32 AM

## 2024-04-10 NOTE — Plan of Care (Signed)

## 2024-04-10 NOTE — Progress Notes (Signed)
 [facility]                                                                              [name], is a 63 y.o. male, DOB - 01/26/1961, FMW:[id number] Admit date - 04/04/2024    Outpatient Primary MD for the patient is Tony, [name], DO  LOS - 6  days  Chief Complaint  Patient presents with   Shortness of Breath       Brief summary   Patient is a 63 year old male with history of chronic diastolic CHF, right heart failure, postural hypotension, HTN, dyslipidemia, morbid obesity, pulmonary hypertension developmental delay presented with cough, shortness of breath, LE edema.  Patient has not been using O2, was supposed to be on O2 at home.  Patient was found to be hypoxic by EMS, O2 sats down to 83% on 3 Jones, was placed on 12L via NRB.  Per family, has been having coughing, ongoing for the past week, productive.  Patient has been having trouble laying flat, was given prescription for Lasix  but not improving.  No fevers or chills. Was given prescription for Lasix  but not improving.  Also on Augmentin but does not seem to help. Patient also has history of right ventricular failure related to pulmonary hypertension, follows Dr. Anner with cardiology. Per family, takes Lasix  20 mg daily.  For last 5 days, Lasix  had been increased to 40 and lately even 80 mg without significant improvement  12/7:  cardiology following, on high-dose IV Lasix .  Still on 10 Jones O2 via HFNC 12/8: On IV Lasix , right heart cath-> severe PAH  Assessment & Plan      Acute on chronic respiratory failure with hypoxemia (HCC) Acute on chronic diastolic CHF Pulmonary hypertension, right ventricular failure -Multi factorial, likely due to acute diastolic CHF, right heart failure pulm hypertension, possible underlying pneumonia, obesity hypoventilation.  Chest x-ray consistent with interstitial edema/pulmonary edema.  BNP 1097.5, troponin 31-31-34, D-dimer negative -  2D echo showed EF of 60 to 65%, G2  DD, interventricular septum flattened in systole and diastolic consistent with right ventricular pressure and volume overload.  Severely elevated pulmonary artery systolic pressure 80.6 mmHg. - Placed on high-dose IV Lasix , negative balance of 12 Jones.   - weight 189.6lbs on admission-> 183  - Underwent right VATS on 12/8 with severe PAH, normal filling pressures. - Seen by Dr. Rolan today, plan to stop Lasix  and start p.o. torsemide  tomorrow - Wean O2 as tolerated, currently on 8 Jones HFNC   Pulmonary hypertension with RV failure/cor pulmonale - Chronic, longstanding, per CHF team, combination of WHO group 2 diastolic CHF and group 3 PAH (OHS/OSA), on O2 4 Jones - Suspect progressive RV failure with ongoing severe PAH, planning to try on sildenafil     CAP (community acquired pneumonia) - Chest x-ray more consistent with pulmonary edema, no infiltrates.  BNP elevated - Procalcitonin 0.12, lactic acid 1.2.  Productive cough - Continue nebs, completed IV Zithromax , Rocephin  for 5 days -  pulmonary hygiene, incentive spirometry   History of postural hypotension - BP borderline, continue midodrine  10 mg 3 times daily   Diabetes mellitus type  2 - hemoglobin A1c 8.8 CBG (last 3)  Recent Labs    04/09/24 1604 04/09/24 2109 04/10/24 0555  GLUCAP 129* 256* 111*   Change to moderate SSI Continue Lantus  8 units nightly  CKD stage 3b - Baseline creatinine 1.6-2 - Currently creatinine within baseline.  Dyslipidemia - Continue Lipitor  Obesity class II Estimated body mass index is 34.62 kg/m as calculated from the following:   Height as of this encounter: 5' 1 (1.549 m).   Weight as of this encounter: 83.1 kg.  Code Status: Full code DVT Prophylaxis:  SCDs Start: 04/05/24 0725   Level of Care: Level of care: Progressive Family Communication: Updated patient's brother at the bedside today Disposition Plan:      Remains inpatient appropriate:      Procedures:  2D  echo  Consultants:   Cardiology  Antimicrobials:   Anti-infectives (From admission, onward)    Start     Dose/Rate Route Frequency Ordered Stop   04/05/24 1800  azithromycin  (ZITHROMAX ) 500 mg in sodium chloride  0.9 % 250 mL IVPB  Status:  Discontinued        500 mg 250 mL/hr over 60 Minutes Intravenous Every 24 hours 04/05/24 0009 04/05/24 1425   04/05/24 1600  cefTRIAXone  (ROCEPHIN ) 2 g in sodium chloride  0.9 % 100 mL IVPB  Status:  Discontinued        2 g 200 mL/hr over 30 Minutes Intravenous Every 24 hours 04/05/24 0009 04/09/24 1412   04/05/24 1515  azithromycin  (ZITHROMAX ) tablet 500 mg  Status:  Discontinued        500 mg Oral Daily 04/05/24 1425 04/09/24 1412   04/04/24 1800  cefTRIAXone  (ROCEPHIN ) 2 g in sodium chloride  0.9 % 100 mL IVPB        2 g 200 mL/hr over 30 Minutes Intravenous  Once 04/04/24 1755 04/04/24 1855   04/04/24 1800  azithromycin  (ZITHROMAX ) 500 mg in sodium chloride  0.9 % 250 mL IVPB        500 mg 250 mL/hr over 60 Minutes Intravenous  Once 04/04/24 1755 04/04/24 2002          Medications  atorvastatin   40 mg Oral q1800   guaiFENesin   600 mg Oral BID   hydrocortisone  cream   Topical BID   insulin  aspart  0-15 Units Subcutaneous TID WC   insulin  aspart  0-5 Units Subcutaneous QHS   insulin  glargine  8 Units Subcutaneous QHS   midodrine   10 mg Oral TID   potassium chloride   40 mEq Oral Once   sildenafil   20 mg Oral TID   sodium chloride  flush  3 mL Intravenous Q12H   sodium chloride  flush  3 mL Intravenous Q12H      Subjective:   Tony Jones was seen and examined today.  On 8 Jones O2 via HFNC.  No acute chest pain or increased WOB.  Brother at the bedside.    Objective:   Vitals:   04/09/24 2300 04/10/24 0300 04/10/24 0643 04/10/24 0735  BP: (!) 88/63 (!) 78/55 95/67 99/72   Pulse: 91 95  83  Resp: 19 18 18 19   Temp: 98.3 F (36.8 C) 98.3 F (36.8 C) 98.3 F (36.8 C) 98.3 F (36.8 C)  TempSrc: Oral Oral Oral Oral  SpO2: 97% 97% 97%  91%  Weight:  83.1 kg    Height:  5' 1 (1.549 m)      Intake/Output Summary (Last 24 hours) at 04/10/2024 1101 Last data filed at 04/10/2024 0820 Gross  per 24 hour  Intake 127 ml  Output 1150 ml  Net -1023 ml     Wt Readings from Last 3 Encounters:  04/10/24 83.1 kg  08/24/23 85.8 kg  07/04/23 86 kg    Physical Exam General: Alert and oriented x 3, NAD Cardiovascular: S1 S2 clear, RRR.  Respiratory: No wheezing or rails Gastrointestinal: Soft, nontender, nondistended, NBS Ext: + pedal edema bilaterally Neuro: no new deficits Psych: Normal affect   Data Reviewed:  I have personally reviewed following labs    CBC Lab Results  Component Value Date   WBC 11.7 (H) 04/10/2024   RBC 5.56 04/10/2024   HGB 18.5 (H) 04/10/2024   HCT 56.6 (H) 04/10/2024   MCV 101.8 (H) 04/10/2024   MCH 33.3 04/10/2024   PLT 117 (Jones) 04/10/2024   MCHC 32.7 04/10/2024   RDW 15.6 (H) 04/10/2024   LYMPHSABS 0.8 04/04/2024   MONOABS 0.5 04/04/2024   EOSABS 0.3 04/04/2024   BASOSABS 0.0 04/04/2024     Last metabolic panel Lab Results  Component Value Date   NA 138 04/10/2024   K 3.6 04/10/2024   CL 89 (Jones) 04/10/2024   CO2 36 (H) 04/10/2024   BUN 56 (H) 04/10/2024   CREATININE 1.98 (H) 04/10/2024   GLUCOSE 105 (H) 04/10/2024   GFRNONAA 37 (Jones) 04/10/2024   GFRAA 44 (Jones) 01/25/2019   CALCIUM  7.7 (Jones) 04/10/2024   PHOS 4.2 04/10/2024   PROT 5.1 (Jones) 04/05/2024   ALBUMIN  2.5 (Jones) 04/10/2024   BILITOT 1.2 04/05/2024   ALKPHOS 94 04/05/2024   AST 29 04/05/2024   ALT 41 04/05/2024   ANIONGAP 13 04/10/2024    CBG (last 3)  Recent Labs    04/09/24 1604 04/09/24 2109 04/10/24 0555  GLUCAP 129* 256* 111*      Coagulation Profile: Recent Labs  Lab 04/04/24 1740  INR 1.2     Radiology Studies: I have personally reviewed the imaging studies  CARDIAC CATHETERIZATION Result Date: 04/09/2024 RA 6 RV 93/6/11 PA 95/41 mean 60 PCWP 11 AO 92% PA 62% CO 3.3 Jones/min (Fick) CI 1.85 SVR  15.95 PVR 14.47        Keshonda Monsour M.D. [facility] 04/10/2024, 11:01 AM  Available via Epic secure chat 7am-7pm After 7 pm, please refer to night coverage provider listed on amion.

## 2024-04-11 DIAGNOSIS — J9611 Chronic respiratory failure with hypoxia: Secondary | ICD-10-CM

## 2024-04-11 DIAGNOSIS — I5033 Acute on chronic diastolic (congestive) heart failure: Secondary | ICD-10-CM

## 2024-04-11 LAB — CBC
HCT: 53.3 % — ABNORMAL HIGH (ref 39.0–52.0)
Hemoglobin: 17.2 g/dL — ABNORMAL HIGH (ref 13.0–17.0)
MCH: 33.1 pg (ref 26.0–34.0)
MCHC: 32.3 g/dL (ref 30.0–36.0)
MCV: 102.7 fL — ABNORMAL HIGH (ref 80.0–100.0)
Platelets: 89 K/uL — ABNORMAL LOW (ref 150–400)
RBC: 5.19 MIL/uL (ref 4.22–5.81)
RDW: 15.4 % (ref 11.5–15.5)
WBC: 9.4 K/uL (ref 4.0–10.5)
nRBC: 0 % (ref 0.0–0.2)

## 2024-04-11 LAB — RENAL FUNCTION PANEL
Albumin: 2.3 g/dL — ABNORMAL LOW (ref 3.5–5.0)
Anion gap: 8 (ref 5–15)
BUN: 52 mg/dL — ABNORMAL HIGH (ref 8–23)
CO2: 38 mmol/L — ABNORMAL HIGH (ref 22–32)
Calcium: 7.9 mg/dL — ABNORMAL LOW (ref 8.9–10.3)
Chloride: 94 mmol/L — ABNORMAL LOW (ref 98–111)
Creatinine, Ser: 1.63 mg/dL — ABNORMAL HIGH (ref 0.61–1.24)
GFR, Estimated: 47 mL/min — ABNORMAL LOW (ref >60)
Glucose, Bld: 106 mg/dL — ABNORMAL HIGH (ref 70–99)
Phosphorus: 3.2 mg/dL (ref 2.5–4.6)
Potassium: 4.1 mmol/L (ref 3.5–5.1)
Sodium: 140 mmol/L (ref 135–145)

## 2024-04-11 LAB — GLUCOSE, CAPILLARY
Glucose-Capillary: 115 mg/dL — ABNORMAL HIGH (ref 70–99)
Glucose-Capillary: 189 mg/dL — ABNORMAL HIGH (ref 70–99)
Glucose-Capillary: 141 mg/dL — ABNORMAL HIGH (ref 70–99)
Glucose-Capillary: 201 mg/dL — ABNORMAL HIGH (ref 70–99)

## 2024-04-11 MED ORDER — SILDENAFIL CITRATE 20 MG PO TABS
10.0000 mg | ORAL_TABLET | Freq: Three times a day (TID) | ORAL | Status: DC
  Administered 2024-04-11 – 2024-04-12 (×4): 10 mg via ORAL
  Filled 2024-04-11 (×6): qty 1

## 2024-04-11 MED ORDER — FUROSEMIDE 20 MG PO TABS
20.0000 mg | ORAL_TABLET | Freq: Two times a day (BID) | ORAL | Status: DC
  Administered 2024-04-11 – 2024-04-12 (×3): 20 mg via ORAL
  Filled 2024-04-11 (×3): qty 1

## 2024-04-11 NOTE — Plan of Care (Signed)
°  Problem: Coping: Goal: Ability to adjust to condition or change in health will improve Outcome: Progressing   Problem: Nutrition: Goal: Adequate nutrition will be maintained Outcome: Progressing   Problem: Coping: Goal: Level of anxiety will decrease Outcome: Progressing   Problem: Safety: Goal: Ability to remain free from injury will improve Outcome: Progressing   Problem: Skin Integrity: Goal: Risk for impaired skin integrity will decrease Outcome: Progressing   Problem: Activity: Goal: Ability to return to baseline activity level will improve Outcome: Progressing

## 2024-04-11 NOTE — Assessment & Plan Note (Addendum)
 AKI  At the time of discharge serum cr is 1,84 with K at 4,5 and serum bicarbonate at 42 Na 141 and P 3,8   Plan to continue diuresis with furosemide  and follow up renal function and electrolytes as outpatient.

## 2024-04-11 NOTE — Assessment & Plan Note (Addendum)
 Uncontrolled hyperglycemia with Hgb A1c 8.8  Patient was placed on insulin  sliding scale for glucose cover and monitoring with insulin  sliding scale.  Basal insulin  8 units.  His glucose remained stable. At home will resume diet control for now and close follow up as outpatient.    Continue with statin

## 2024-04-11 NOTE — Progress Notes (Signed)
 Advanced Heart Failure Rounding Note  Cardiologist: Alm Clay, MD  Chief Complaint: pulmonary hypertension Subjective:    SBP 70-80s. sCr 1.98>1.63. On 6L O2  Lying in bed, grandmother at the bedside. Feels well this morning. No SOB or pain.   Objective:    Weight Range: 80.2 kg Body mass index is 33.41 kg/m.   Vital Signs:   Temp:  [97.5 F (36.4 C)-98.2 F (36.8 C)] 97.5 F (36.4 C) (12/10 0743) Pulse Rate:  [69-87] 87 (12/10 0743) Resp:  [17-18] 17 (12/10 0743) BP: (72-105)/(50-74) 93/70 (12/10 0743) SpO2:  [93 %-96 %] 94 % (12/09 2300) Weight:  [80.2 kg] 80.2 kg (12/10 0413) Last BM Date : 04/09/24  Weight change: Filed Weights   04/09/24 0400 04/10/24 0300 04/11/24 0413  Weight: 83.8 kg 83.1 kg 80.2 kg    Intake/Output:  Intake/Output Summary (Last 24 hours) at 04/11/2024 0818 Last data filed at 04/11/2024 0300 Gross per 24 hour  Intake 545 ml  Output 2150 ml  Net -1605 ml    Physical Exam    General: Well appearing. No distress  Cardiac: JVP difficult to assess. No murmurs  Resp: Lung sounds clear and equal B/L Extremities: Warm and dry.  No peripheral edema.  Neuro: Affect pleasant.   Telemetry   SR 80s (personally reviewed)  Labs    CBC Recent Labs    04/10/24 0224 04/11/24 0216  WBC 11.7* 9.4  HGB 18.5* 17.2*  HCT 56.6* 53.3*  MCV 101.8* 102.7*  PLT 117* 89*   Basic Metabolic Panel Recent Labs    87/90/74 0223 04/10/24 0224 04/11/24 0216  NA  --  138 140  K  --  3.6 4.1  CL  --  89* 94*  CO2  --  36* 38*  GLUCOSE  --  105* 106*  BUN  --  56* 52*  CREATININE  --  1.98* 1.63*  CALCIUM   --  7.7* 7.9*  MG 2.4  --   --   PHOS  --  4.2 3.2   Liver Function Tests Recent Labs    04/10/24 0224 04/11/24 0216  ALBUMIN  2.5* 2.3*   BNP (last 3 results) Recent Labs    04/04/24 1609  BNP 1,097.5*   Medications:    Scheduled Medications:  atorvastatin   40 mg Oral q1800   enoxaparin  (LOVENOX ) injection  40 mg  Subcutaneous QHS   guaiFENesin   600 mg Oral BID   hydrocortisone  cream   Topical BID   insulin  aspart  0-15 Units Subcutaneous TID WC   insulin  aspart  0-5 Units Subcutaneous QHS   insulin  glargine  8 Units Subcutaneous QHS   midodrine   10 mg Oral TID   sildenafil   20 mg Oral TID   sodium chloride  flush  3 mL Intravenous Q12H   sodium chloride  flush  3 mL Intravenous Q12H    Infusions:   PRN Medications: acetaminophen  **OR** acetaminophen , albuterol , diphenhydrAMINE , HYDROcodone -acetaminophen , ondansetron  **OR** ondansetron  (ZOFRAN ) IV, sodium chloride  flush, sodium chloride  flush  Assessment/Plan   1. Pulmonary hypertension with RV failure/cor pulmonale and Chronic Hypoxic Respiratory Failure: Suspect that the pulmonary hypertension is a combination of group 2 (diastolic CHF) and primarily group 3 PH (OHS/OSA).  He is on 4L oxygen , should wear at all times, however only wears when feels bad. He has CPAP but cannot tolerate.  High resolution CT chest 2019 was not suggestive of ILD.  V/Q scan did not show chronic PEs in 2016. Repeat RHC 12/25 with stable severe  PAH 95/41 (60) from PA 93/51 (67) in 10/2017.  - Suspect exacerbated by PNA and dietary noncompliance d/t disruption in routine. - Normal filling pressures on RHC 12/8. - decrease sildenafil   to 10 mg tid with hypotension - continue midodrine  10 mg tid - continue to wean O2 to home 4L (currently on 6L); he needs better compliance with O2 and CPAP at home, this is a struggle with mentation.  - unna boots in place 2. Chronic diastolic CHF:  In addition to RV failure, last RHC showed elevated PCWP.  Echo 5/19 EF 60-65% on echo. Stable EF 12/25 EF 60-65%.   - Euvolemic on exam. Restart home lasix  20 mg bid 3. CKD: Stage 3. - baseline Cr ~2.0 - stable 4. H/o GI bleeding: Capsule negative in 6/19.  No bleeding. Hgb stable /elevated. 5. 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  - Continue atorvastatin  40 mg daily. LDL 36 - no ASA with  history of GIB  Length of Stay: 7  Calene Paradiso, NP  04/11/2024, 8:18 AM  Advanced Heart Failure Team Pager [phone] (M-F; 7a - 5p)  Please contact [facility] for night-coverage after hours (5p -7a ) and weekends on [email or web address]

## 2024-04-11 NOTE — Hospital Course (Addendum)
 Tony Jones was admitted to the hospital with the working diagnosis of heart failure exacerbation.   63 year old male with history of heart failure, pulmonary hypertension, postural hypotension, HTN, dyslipidemia, obesity class 2 and developmental delay presented with cough, dyspnea and lower extremity edema.  Reported one week of coughing and progressive dyspnea, along with orthopnea and lower extremity edema. Apparently he not been compliant with supplemental 02 at home. His symptoms continued to improve despite increase in furosemide  dose.  Because severity of symptoms, EMS was called he was found dyspneic with 02 saturation 83% on 3 L/min per [place], hw as placed on 12 L/min non re-breather and was transported to the ED.  On his initial physical examination his blood pressure was 111/81, HR 84, RR 16 and 02 saturation 92%. Lungs with with positive rales with no wheezing, heart with S1 and S2 present and regular, loud S2, no gallops or rubs, abdomen with no distention and positive lower extremity edema ++.   Na 134, K 3,7 Cl 98 bicarbonate 28, glucose 253, bun 47, cr 2.13  AST 30 ALT 50  BNP 1,097  High sensitive troponin 31, 31, and 34  Wbc 11.8 hgb 19,5 plt 133  D dimer 0,45   Sars covid 19 negative Influenza negative RSV negative   Urine analysis SG 1,008, protein 30, negative leukocytes and negative hgb   Chest radiograph with cardiomegaly, bilateral hilar vascular congestion, bilateral central interstitial infiltrates with no effusions.   EKG 82 bpm, right axis deviation, qtc 483 with normal intervals, sinus rhythm with right atrial enlargement, no significant ST segment changes, negative T wave V1 to V5.   12/7:  patient was placed on high dose furosemide  and supplemental 02 per [place] high flow.  12/8:cardiac catheterization with severe pulmonary hypertension.  12/11 improved volume status, using 6 L/min per [employer]. Positive 02 desaturation at night.

## 2024-04-11 NOTE — Assessment & Plan Note (Signed)
 Patient completed antibiotic therapy with ceftriaxone  and azithromycin .,  Continue airway clearing techniques with flutter valve and incentive spirometer.

## 2024-04-11 NOTE — Assessment & Plan Note (Signed)
 Calculated BMI is 33.4

## 2024-04-11 NOTE — TOC Progression Note (Signed)
 Transition of Care [facility]) - Progression Note    Patient Details  Name: Tony Jones MRN: [id number] Date of Birth: 27-Dec-1960  [facility] CM/SW Contact  Waddell Barnie Rama, RN Phone Number: 04/11/2024, 2:58 PM  Clinical Narrative:    NCM spoke with Randine at [facility], per MD most likely will dc tomorrow.  Randine with [facility] states they will supply the transportation for him to get home.                      Expected Discharge Plan and Services                                               Social Drivers of Health (SDOH) Interventions SDOH Screenings   Food Insecurity: No Food Insecurity (04/05/2024)  Housing: Low Risk  (04/05/2024)  Transportation Needs: No Transportation Needs (04/05/2024)  Utilities: Not At Risk (04/05/2024)  Tobacco Use: Low Risk  (04/06/2024)    Readmission Risk Interventions    04/06/2024    1:27 PM  Readmission Risk Prevention Plan  Transportation Screening Complete  Home Care Screening Complete  Medication Review (RN CM) Complete

## 2024-04-11 NOTE — Progress Notes (Signed)
 Mobility Specialist Progress Note:    04/11/24 1223  Mobility  Activity Stood at bedside;Dangled on edge of bed (2 STS, Standing Marches, Ankle Pumps, Leg Ext, Heel Slides)  Level of Assistance Standby assist, set-up cues, supervision of patient - no hands on  Assistive Device None  Range of Motion/Exercises Active;Active Assistive  Activity Response Tolerated fair  Mobility Referral Yes  Mobility visit 1 Mobility  Mobility Specialist Start Time (ACUTE ONLY) 1223  Mobility Specialist Stop Time (ACUTE ONLY) 1237  Mobility Specialist Time Calculation (min) (ACUTE ONLY) 14 min   Received pt laying in bed reluctant but agreeable to session. Pt c/o LLE knee pain, tightness, and stiffness. Pt needing CGA to transfer to EOB and to stand. Pt able to perform movements w/ cueing and assist for some. Performed some calf stretching for pt and stated that it helped. Returned pt to bed w/ all needs met.   Venetia Keel Mobility Specialist Please Neurosurgeon or Rehab Office at [phone]

## 2024-04-11 NOTE — Plan of Care (Signed)
  Problem: Coping: Goal: Ability to adjust to condition or change in health will improve Outcome: Progressing   Problem: Health Behavior/Discharge Planning: Goal: Ability to identify and utilize available resources and services will improve Outcome: Progressing   Problem: Nutritional: Goal: Maintenance of adequate nutrition will improve Outcome: Progressing

## 2024-04-11 NOTE — Assessment & Plan Note (Addendum)
 Echocardiogram with preserved LV systolic function EF 60 to 65%, mild LVH, grade II diastolic dysfunction, interventricular septum is flattened in systole and diastole, RV systolic function preserved, RVSP 80 mmHg, mild aortic stenosis.   12/08 cardiac catheterization  RA 6 RV 93/2 mean 11 PA 95/41 mean 60  PCWP 11 Cardiac output 3,3 and index 1,85 (Fick)  Acute on chronic cor pulmonale Pulmonary hypertension  RV failure.   Patient was placed on furosemide  for diuresis, negative fluid balance was achieved,- 14,353 ml, with significant improvement in his symptoms.    Plan to continue blood pressure support with midodrine   Continue sildenafil  for pulmonary hypertension.  Loop diuretic with furosemide  20 mg po daily.  Supplemental 02 per [place] to keep 02 saturation 88% or greater.  Cpap at night.   Acute on chronic hypoxic respiratory failure, due to cardiogenic pulmonary edema, and pulmonary hypertension.  At baseline on high 02 requirements.  Today 02 saturation is 95% on 6 L.min per [place] Cpap at night.   Poor prognosis in the setting of pulmonary hypertension and right ventricular failure.

## 2024-04-11 NOTE — Assessment & Plan Note (Signed)
Continue local care

## 2024-04-11 NOTE — Progress Notes (Signed)
 Progress Note   Patient: Tony Jones FMW:[id number] DOB: 24-Dec-1960 DOA: 04/04/2024     7 DOS: the patient was seen and examined on 04/11/2024   Brief hospital course: Patient is a 63 year old male with history of chronic diastolic CHF, right heart failure, postural hypotension, HTN, dyslipidemia, morbid obesity, pulmonary hypertension developmental delay presented with cough, shortness of breath, LE edema.  Patient has not been using O2, was supposed to be on O2 at home.  Patient was found to be hypoxic by EMS, O2 sats down to 83% on 3 L, was placed on 12L via NRB.  Per family, has been having coughing, ongoing for the past week, productive.  Patient has been having trouble laying flat, was given prescription for Lasix  but not improving.  No fevers or chills. Was given prescription for Lasix  but not improving.  Also on Augmentin but does not seem to help. Patient also has history of right ventricular failure related to pulmonary hypertension, follows Dr. Anner with cardiology. Per family, takes Lasix  20 mg daily.  For last 5 days, Lasix  had been increased to 40 and lately even 80 mg without significant improvement   12/7:  cardiology following, on high-dose IV Lasix .  Still on 10 L O2 via HFNC 12/8: On IV Lasix , right heart cath-> severe PAH  Assessment and Plan: * Acute on chronic diastolic CHF (congestive heart failure) (HCC) Echocardiogram with preserved LV systolic function EF 60 to 65%, mild LVH, grade II diastolic dysfunction, interventricular septum is flattened in systole and diastole, RV systolic function preserved, RVSP 80 mmHg, mild aortic stenosis.   Acute on chronic cor pulmonale Pulmonary hypertension   Urine output 2,150 ml Systolic blood pressure 90 to 899 mmHg.   Plan to transition to po furosemide .  Blood pressure support with midodrine   Continue sildenafil  for pulmonary hypertension   Acute on chronic hypoxic respiratory failure, at baseline on high 02 requirements.   Today 02 saturation is 94% on 6 L.min per [place]   CAP (community acquired pneumonia) Patient completed antibiotic therapy with ceftriaxone  and azithromycin .,  Continue airway clearing techniques with flutter valve and incentive spirometer.   Chronic kidney disease, stage 3b (HCC) AKI  Renal function at baseline  Continue close monitoring Resume oral loop diuretic   Type 2 diabetes mellitus with hyperlipidemia (HCC) Continue glucose cover and monitoring with insulin  sliding scale.  Continue with statin   Pressure injury of skin Continue local care.   Obesity, class 1 Calculated BMI is 33.4         Subjective: Patient with improvement in dyspnea, no chest pain, no nausea or vomiting,  Physical Exam: Vitals:   04/11/24 0413 04/11/24 0743 04/11/24 1131 04/11/24 1514  BP: (!) 90/58 93/70 96/65  101/74  Pulse: 76 87 85 80  Resp: 18 17 17 18   Temp: (!) 97.5 F (36.4 C) (!) 97.5 F (36.4 C) 98.5 F (36.9 C) 98.5 F (36.9 C)  TempSrc: Oral Oral Oral Oral  SpO2:   (!) 87%   Weight: 80.2 kg     Height: 5' 1 (1.549 m)       Neurology awake and alert ENT with mild pallor Cardiovascular with S1 and S2 present and regular with no gallops, rubs or murmurs Respiratory with decreased breath sounds and prolonged expiratory phase, with no wheezing or rhonchi  Abdomen with no distention  No lower extremity edema   Data Reviewed:    Family Communication: no family at the bedside   Disposition: Status is: Inpatient Remains  inpatient appropriate because: recovering heart failure   Planned Discharge Destination: Home     Author: Elidia Toribio Furnace, MD 04/11/2024 3:49 PM  For on call review [email or web address].

## 2024-04-12 ENCOUNTER — Other Ambulatory Visit (HOSPITAL_COMMUNITY): Payer: Self-pay

## 2024-04-12 LAB — RENAL FUNCTION PANEL
Albumin: 2.4 g/dL — ABNORMAL LOW (ref 3.5–5.0)
Anion gap: 2 — ABNORMAL LOW (ref 5–15)
BUN: 50 mg/dL — ABNORMAL HIGH (ref 8–23)
CO2: 42 mmol/L — ABNORMAL HIGH (ref 22–32)
Calcium: 8.2 mg/dL — ABNORMAL LOW (ref 8.9–10.3)
Chloride: 97 mmol/L — ABNORMAL LOW (ref 98–111)
Creatinine, Ser: 1.84 mg/dL — ABNORMAL HIGH (ref 0.61–1.24)
GFR, Estimated: 41 mL/min — ABNORMAL LOW (ref >60)
Glucose, Bld: 109 mg/dL — ABNORMAL HIGH (ref 70–99)
Phosphorus: 3.8 mg/dL (ref 2.5–4.6)
Potassium: 4.5 mmol/L (ref 3.5–5.1)
Sodium: 141 mmol/L (ref 135–145)

## 2024-04-12 LAB — GLUCOSE, CAPILLARY
Glucose-Capillary: 126 mg/dL — ABNORMAL HIGH (ref 70–99)
Glucose-Capillary: 274 mg/dL — ABNORMAL HIGH (ref 70–99)

## 2024-04-12 MED ORDER — FUROSEMIDE 20 MG PO TABS: 20.0000 mg | ORAL_TABLET | Freq: Two times a day (BID) | ORAL | 0 refills | Status: DC

## 2024-04-12 MED ORDER — MIDODRINE HCL 10 MG PO TABS
10.0000 mg | ORAL_TABLET | Freq: Three times a day (TID) | ORAL | 0 refills | Status: DC
  Filled 2024-04-12: qty 90, 30d supply, fill #0

## 2024-04-12 MED ORDER — SILDENAFIL CITRATE 20 MG PO TABS: 10.0000 mg | ORAL_TABLET | Freq: Three times a day (TID) | ORAL | 0 refills | Status: AC

## 2024-04-12 MED ORDER — SILDENAFIL CITRATE 20 MG PO TABS
10.0000 mg | ORAL_TABLET | Freq: Three times a day (TID) | ORAL | 0 refills | Status: DC
  Filled 2024-04-12: qty 45, 30d supply, fill #0

## 2024-04-12 MED ORDER — FUROSEMIDE 20 MG PO TABS
20.0000 mg | ORAL_TABLET | Freq: Two times a day (BID) | ORAL | 0 refills | Status: DC
  Filled 2024-04-12: qty 30, 15d supply, fill #0

## 2024-04-12 NOTE — Plan of Care (Signed)
°  Problem: Coping: Goal: Ability to adjust to condition or change in health will improve Outcome: Progressing   Problem: Nutritional: Goal: Maintenance of adequate nutrition will improve Outcome: Progressing   Problem: Skin Integrity: Goal: Risk for impaired skin integrity will decrease Outcome: Progressing   Problem: Health Behavior/Discharge Planning: Goal: Ability to manage health-related needs will improve Outcome: Progressing   Problem: Clinical Measurements: Goal: Respiratory complications will improve Outcome: Progressing   Problem: Nutrition: Goal: Adequate nutrition will be maintained Outcome: Progressing   Problem: Coping: Goal: Level of anxiety will decrease Outcome: Progressing   Problem: Safety: Goal: Ability to remain free from injury will improve Outcome: Progressing

## 2024-04-12 NOTE — Progress Notes (Signed)
 Advanced Heart Failure Rounding Note  Cardiologist: Alm Clay, MD  Chief Complaint: pulmonary hypertension Subjective:    SBP improved 80-90s.   Lying in bed, grandmother at the bedside. Feels well this morning. No SOB or pain.   Objective:    Weight Range: 83.5 kg Body mass index is 34.78 kg/m.   Vital Signs:   Temp:  [97.7 F (36.5 C)-98.5 F (36.9 C)] 97.7 F (36.5 C) (12/11 0738) Pulse Rate:  [80-102] 90 (12/11 0748) Resp:  [17-18] 17 (12/11 0738) BP: (85-101)/(54-74) 85/54 (12/11 0738) SpO2:  [52 %-95 %] 91 % (12/11 0748) Weight:  [83.5 kg] 83.5 kg (12/11 0300) Last BM Date : 04/09/24  Weight change: Filed Weights   04/10/24 0300 04/11/24 0413 04/12/24 0300  Weight: 83.1 kg 80.2 kg 83.5 kg    Intake/Output:  Intake/Output Summary (Last 24 hours) at 04/12/2024 0751 Last data filed at 04/11/2024 2346 Gross per 24 hour  Intake 720 ml  Output 1400 ml  Net -680 ml    Physical Exam    General: Well appearing. No distress  Cardiac: JVP flat. No murmurs  Resp: Lung sounds clear and equal B/L Abdomen: Soft, non-distended.  Extremities: Warm and dry.  No peripheral edema.   Telemetry   VP 90s (personally reviewed)  Labs    CBC Recent Labs    04/10/24 0224 04/11/24 0216  WBC 11.7* 9.4  HGB 18.5* 17.2*  HCT 56.6* 53.3*  MCV 101.8* 102.7*  PLT 117* 89*   Basic Metabolic Panel Recent Labs    87/90/74 0223 04/10/24 0224 04/11/24 0216 04/12/24 0230  NA  --    < > 140 141  K  --    < > 4.1 4.5  CL  --    < > 94* 97*  CO2  --    < > 38* 42*  GLUCOSE  --    < > 106* 109*  BUN  --    < > 52* 50*  CREATININE  --    < > 1.63* 1.84*  CALCIUM   --    < > 7.9* 8.2*  MG 2.4  --   --   --   PHOS  --    < > 3.2 3.8   < > = values in this interval not displayed.   Liver Function Tests Recent Labs    04/11/24 0216 04/12/24 0230  ALBUMIN  2.3* 2.4*   BNP (last 3 results) Recent Labs    04/04/24 1609  BNP 1,097.5*   Medications:     Scheduled Medications:  atorvastatin   40 mg Oral q1800   enoxaparin  (LOVENOX ) injection  40 mg Subcutaneous QHS   furosemide   20 mg Oral BID   guaiFENesin   600 mg Oral BID   hydrocortisone  cream   Topical BID   insulin  aspart  0-15 Units Subcutaneous TID WC   insulin  aspart  0-5 Units Subcutaneous QHS   insulin  glargine  8 Units Subcutaneous QHS   midodrine   10 mg Oral TID   sildenafil   10 mg Oral TID   sodium chloride  flush  3 mL Intravenous Q12H   sodium chloride  flush  3 mL Intravenous Q12H    Infusions:   PRN Medications: acetaminophen  **OR** acetaminophen , albuterol , diphenhydrAMINE , HYDROcodone -acetaminophen , ondansetron  **OR** ondansetron  (ZOFRAN ) IV, sodium chloride  flush, sodium chloride  flush  Assessment/Plan   1. 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  He is on 4L oxygen , should wear at all times, however only wears when feels bad. He has CPAP but cannot tolerate.  High resolution CT chest 2019 was not suggestive of ILD.  V/Q scan did not show chronic PEs in 2016. Repeat RHC 12/25 with stable severe PAH 95/41 (60) from PA 93/51 (67) in 10/2017.  - Suspect exacerbated by PNA and dietary noncompliance d/t disruption in routine. - Normal filling pressures on RHC 12/8. - continue sildenafil   to 10 mg tid with hypotension - continue midodrine  10 mg tid - continue to wean O2 to home 4L (currently on 6L); he needs better compliance with O2 and CPAP at home, this is a struggle with mentation.   2. Chronic diastolic CHF:  In addition to RV failure, last RHC showed elevated PCWP.  Echo 5/19 EF 60-65% on echo. Stable EF 12/25 EF 60-65%.   - conitnue lasix  20 mg bid  3. CKD: Stage 3. - baseline Cr ~2.0 - stable  4. H/o GI bleeding: Capsule negative in 6/19.  No bleeding. Hgb stable /elevated.  5. CAD:  Nonobstructive on 6/19 cath.  - Continue atorvastatin  40 mg daily. LDL 36 - no ASA with history of GIB  Stable from a HF/pHTN standpoint.   Length of Stay: 8  Milicent Acheampong, NP  04/12/2024, 7:51 AM  Advanced Heart Failure Team Pager [phone] (M-F; 7a - 5p)  Please contact [facility] for night-coverage after hours (5p -7a ) and weekends on [email or web address]

## 2024-04-12 NOTE — Progress Notes (Signed)
 Mobility Specialist Progress Note:    04/12/24 1049  Mobility  Activity Dangled on edge of bed;Stood at bedside  Level of Assistance Standby assist, set-up cues, supervision of patient - no hands on  Assistive Device Other (Comment) (HHA)  Range of Motion/Exercises Active;Active Assistive  Activity Response Tolerated fair  Mobility Referral Yes  Mobility visit 1 Mobility  Mobility Specialist Start Time (ACUTE ONLY) 1049  Mobility Specialist Stop Time (ACUTE ONLY) 1056  Mobility Specialist Time Calculation (min) (ACUTE ONLY) 7 min   Received pt sitting EOB agreeable to session. Pt seemed in a better mood and not as much pain in left knee. Pt able to perform seated and standing exercises. Performed some stretching for pt. Returned pt to bed w/ all needs met.   Venetia Keel Mobility Specialist Please Neurosurgeon or Rehab Office at [phone]

## 2024-04-12 NOTE — Plan of Care (Signed)
 Patient ID: Tony Jones, male   DOB: 10-31-1960, 63 y.o.   MRN: [id number]  Problem: Education: Goal: Ability to describe self-care measures that may prevent or decrease complications (Diabetes Survival Skills Education) will improve Outcome: Adequate for Discharge Goal: Individualized Educational Video(s) Outcome: Adequate for Discharge   Problem: Coping: Goal: Ability to adjust to condition or change in health will improve Outcome: Adequate for Discharge   Problem: Fluid Volume: Goal: Ability to maintain a balanced intake and output will improve Outcome: Adequate for Discharge   Problem: Health Behavior/Discharge Planning: Goal: Ability to identify and utilize available resources and services will improve Outcome: Adequate for Discharge Goal: Ability to manage health-related needs will improve Outcome: Adequate for Discharge   Problem: Metabolic: Goal: Ability to maintain appropriate glucose levels will improve Outcome: Adequate for Discharge   Problem: Nutritional: Goal: Maintenance of adequate nutrition will improve Outcome: Adequate for Discharge Goal: Progress toward achieving an optimal weight will improve Outcome: Adequate for Discharge   Problem: Skin Integrity: Goal: Risk for impaired skin integrity will decrease Outcome: Adequate for Discharge   Problem: Tissue Perfusion: Goal: Adequacy of tissue perfusion will improve Outcome: Adequate for Discharge   Problem: Education: Goal: Knowledge of General Education information will improve Description: Including pain rating scale, medication(s)/side effects and non-pharmacologic comfort measures Outcome: Adequate for Discharge   Problem: Health Behavior/Discharge Planning: Goal: Ability to manage health-related needs will improve Outcome: Adequate for Discharge   Problem: Clinical Measurements: Goal: Ability to maintain clinical measurements within normal limits will improve Outcome: Adequate for Discharge Goal:  Will remain free from infection Outcome: Adequate for Discharge Goal: Diagnostic test results will improve Outcome: Adequate for Discharge Goal: Respiratory complications will improve Outcome: Adequate for Discharge Goal: Cardiovascular complication will be avoided Outcome: Adequate for Discharge   Problem: Activity: Goal: Risk for activity intolerance will decrease Outcome: Adequate for Discharge   Problem: Nutrition: Goal: Adequate nutrition will be maintained Outcome: Adequate for Discharge   Problem: Coping: Goal: Level of anxiety will decrease Outcome: Adequate for Discharge   Problem: Elimination: Goal: Will not experience complications related to bowel motility Outcome: Adequate for Discharge Goal: Will not experience complications related to urinary retention Outcome: Adequate for Discharge   Problem: Pain Managment: Goal: General experience of comfort will improve and/or be controlled Outcome: Adequate for Discharge   Problem: Safety: Goal: Ability to remain free from injury will improve Outcome: Adequate for Discharge   Problem: Skin Integrity: Goal: Risk for impaired skin integrity will decrease Outcome: Adequate for Discharge   Problem: Education: Goal: Ability to demonstrate management of disease process will improve Outcome: Adequate for Discharge Goal: Ability to verbalize understanding of medication therapies will improve Outcome: Adequate for Discharge Goal: Individualized Educational Video(s) Outcome: Adequate for Discharge   Problem: Activity: Goal: Capacity to carry out activities will improve Outcome: Adequate for Discharge   Problem: Cardiac: Goal: Ability to achieve and maintain adequate cardiopulmonary perfusion will improve Outcome: Adequate for Discharge   Problem: Education: Goal: Understanding of CV disease, CV risk reduction, and recovery process will improve Outcome: Adequate for Discharge Goal: Individualized Educational  Video(s) Outcome: Adequate for Discharge   Problem: Activity: Goal: Ability to return to baseline activity level will improve Outcome: Adequate for Discharge   Problem: Cardiovascular: Goal: Ability to achieve and maintain adequate cardiovascular perfusion will improve Outcome: Adequate for Discharge Goal: Vascular access site(s) Level 0-1 will be maintained Outcome: Adequate for Discharge   Problem: Health Behavior/Discharge Planning: Goal: Ability to safely manage health-related needs  after discharge will improve Outcome: Adequate for Discharge    Verdie JONETTA Collier, RN

## 2024-04-12 NOTE — TOC Transition Note (Addendum)
 Transition of Care [facility]) - Discharge Note   Patient Details  Name: Tony Jones MRN: [id number] Date of Birth: 05/04/1960  [facility]) CM/SW Contact:  Justina Delcia Czar, RN Phone Number: [phone] 04/12/2024, 10:09 AM   Clinical Narrative: Spoke to pt's mother and states she medications come from [facility] pharmacy. States his oxygen  concentrator goes up to 5 L at home. He will need a concentrator that can provide 6 L. States she did not know the name of the provider.  Pt has CPAP at home.    Contacted [facility] rep, Randine and left message for return call.   Received call back from [place] CSW, [place] requested HF CM fax dc summary and RX to Dr Alberta Mall fax # [phone], MD will send off medication to the mail order pharmacy [facility] and it will overnight to pt. Updated attending.   Received call from [place] # [phone] and states she will work on oxygen  set up with [employer].   Mom/Brother will provide transportation to home.   12:30 pm Faxed dc summary, RX and oxygen  order to Dr Mall. Contacted Mitch with [employer] and states she will follow up on oxygen .          Final next level of care: Home/Self Care Barriers to Discharge: No Barriers Identified   Patient Goals and CMS Choice     Discharge Placement                       Discharge Plan and Services Additional resources added to the After Visit Summary for     Discharge Planning Services: CM Consult                Social Drivers of Health (SDOH) Interventions SDOH Screenings   Food Insecurity: No Food Insecurity (04/05/2024)  Housing: Low Risk (04/05/2024)  Transportation Needs: No Transportation Needs (04/05/2024)  Utilities: Not At Risk (04/05/2024)  Tobacco Use: Low Risk (04/06/2024)     Readmission Risk Interventions    04/06/2024    1:27 PM  Readmission Risk Prevention Plan  Transportation Screening Complete  Home Care Screening Complete  Medication Review (RN CM) Complete

## 2024-04-12 NOTE — Progress Notes (Addendum)
 Patient ID: Tony Jones, male   DOB: 08-Mar-1961, 63 y.o.   MRN: [id number]  Patient is currently nonambulatory. SATURATION QUALIFICATIONS: (This note is used to comply with regulatory documentation for home oxygen )  Patient Saturations on 5L at Rest = 78%  Patient Saturations on 6 Liters of oxygen  at rest = 84%  Jordan Lee NP stated any oxygen  saturation 80 %and above is acceptable for this patient.   Tony JONETTA Collier, RN

## 2024-04-12 NOTE — Discharge Summary (Addendum)
 Physician Discharge Summary   Patient: Tony Jones MRN: [id number] DOB: 28-Dec-1960  Admit date:     04/04/2024  Discharge date: 04/12/2024  Discharge Physician: Elidia Sieving Kaeleen Odom   PCP: [name] Annabella CROME, DO   Recommendations at discharge:    Patient was placed on sildenafil  for pulmonary hypertension and midodrine  was increased to 10 mg tid for blood pressure support.  Loop diuretic with furosemide  20 mg po daily.  Continue supplemental 02 per [place], 6 L/min and Cpap at night.  Follow up renal function and electrolytes in 7 days as outpatient.  Follow up with Dr [name] in 7 to 10 days  Follow up with Cardiology as scheduled.   I spoke with patient's mother at the bedside, we talked in detail about patient's condition, plan of care and prognosis and all questions were addressed. Patient with poor prognosis, RV failure and pulmonary hypertension, high 02 requirements. We talked about advance directives, including code status. She will talk to her other children and make further decisions about advance directives.    Discharge Diagnoses: Principal Problem:   Acute on chronic diastolic CHF (congestive heart failure) (HCC) Active Problems:   CAP (community acquired pneumonia)   Chronic kidney disease, stage 3b (HCC)   Type 2 diabetes mellitus with hyperlipidemia (HCC)   Pressure injury of skin   Obesity, class 1  Resolved Problems:   * No resolved hospital problems. [facility] Course: Mr. [name] was admitted to the hospital with the working diagnosis of heart failure exacerbation.   63 year old male with history of heart failure, pulmonary hypertension, postural hypotension, HTN, dyslipidemia, obesity class 2 and developmental delay presented with cough, dyspnea and lower extremity edema.  Reported one week of coughing and progressive dyspnea, along with orthopnea and lower extremity edema. Apparently he not been compliant with supplemental 02 at home. His symptoms continued to  improve despite increase in furosemide  dose.  Because severity of symptoms, EMS was called he was found dyspneic with 02 saturation 83% on 3 L/min per [place], hw as placed on 12 L/min non re-breather and was transported to the ED.  On his initial physical examination his blood pressure was 111/81, HR 84, RR 16 and 02 saturation 92%. Lungs with with positive rales with no wheezing, heart with S1 and S2 present and regular, loud S2, no gallops or rubs, abdomen with no distention and positive lower extremity edema ++.   Na 134, K 3,7 Cl 98 bicarbonate 28, glucose 253, bun 47, cr 2.13  AST 30 ALT 50  BNP 1,097  High sensitive troponin 31, 31, and 34  Wbc 11.8 hgb 19,5 plt 133  D dimer 0,45   Sars covid 19 negative Influenza negative RSV negative   Urine analysis SG 1,008, protein 30, negative leukocytes and negative hgb   Chest radiograph with cardiomegaly, bilateral hilar vascular congestion, bilateral central interstitial infiltrates with no effusions.   EKG 82 bpm, right axis deviation, qtc 483 with normal intervals, sinus rhythm with right atrial enlargement, no significant ST segment changes, negative T wave V1 to V5.   12/7:  patient was placed on high dose furosemide  and supplemental 02 per [place] high flow.  12/8:cardiac catheterization with severe pulmonary hypertension.  12/11 improved volume status, using 6 L/min per [place]. Positive 02 desaturation at night.   Assessment and Plan: * Acute on chronic diastolic CHF (congestive heart failure) (HCC) Echocardiogram with preserved LV systolic function EF 60 to 65%, mild LVH, grade II diastolic dysfunction, interventricular septum is  flattened in systole and diastole, RV systolic function preserved, RVSP 80 mmHg, mild aortic stenosis.   12/08 cardiac catheterization  RA 6 RV 93/2 mean 11 PA 95/41 mean 60  PCWP 11 Cardiac output 3,3 and index 1,85 (Fick)  Acute on chronic cor pulmonale Pulmonary hypertension  RV failure.   Patient was  placed on furosemide  for diuresis, negative fluid balance was achieved,- 14,353 ml, with significant improvement in his symptoms.    Plan to continue blood pressure support with midodrine   Continue sildenafil  for pulmonary hypertension.  Loop diuretic with furosemide  20 mg po daily.  Supplemental 02 per [place] to keep 02 saturation 88% or greater.  Cpap at night.   Acute on chronic hypoxic respiratory failure, due to cardiogenic pulmonary edema, and pulmonary hypertension.  At baseline on high 02 requirements.  Today 02 saturation is 95% on 6 L.min per [place] Cpap at night.   Poor prognosis in the setting of pulmonary hypertension and right ventricular failure.   CAP (community acquired pneumonia) Patient completed antibiotic therapy with ceftriaxone  and azithromycin .,  Continue airway clearing techniques with flutter valve and incentive spirometer.   Chronic kidney disease, stage 3b (HCC) AKI  At the time of discharge serum cr is 1,84 with K at 4,5 and serum bicarbonate at 42 Na 141 and P 3,8   Plan to continue diuresis with furosemide  and follow up renal function and electrolytes as outpatient.   Type 2 diabetes mellitus with hyperlipidemia (HCC) Uncontrolled hyperglycemia with Hgb A1c 8.8  Patient was placed on insulin  sliding scale for glucose cover and monitoring with insulin  sliding scale.  Basal insulin  8 units.  His glucose remained stable. At home will resume diet control for now and close follow up as outpatient.    Continue with statin   Pressure injury of skin Continue local care.   Obesity, class 1 Calculated BMI is 33.4        Consultants: Cardiology  Procedures performed: cardiac catheterization   Disposition: Home Diet recommendation:  Cardiac diet DISCHARGE MEDICATION: Allergies as of 04/12/2024       Reactions   Glucophage  [metformin ] Itching   Codeine Nausea And Vomiting        Medication List     STOP taking these medications     amoxicillin-clavulanate 875-125 MG tablet Commonly known as: AUGMENTIN   potassium chloride  SA 20 MEQ tablet Commonly known as: KLOR-CON  M       TAKE these medications    allopurinol  300 MG tablet Commonly known as: ZYLOPRIM  Take 300 mg by mouth in the morning.   atorvastatin  40 MG tablet Commonly known as: LIPITOR Take 1 tablet (40 mg total) by mouth daily at 6 PM. What changed: when to take this   citalopram  20 MG tablet Commonly known as: CELEXA  Take 20 mg by mouth in the morning.   dextromethorphan-guaiFENesin  10-100 MG/5ML liquid Commonly known as: ROBITUSSIN-DM Take 10 mLs by mouth every 4 (four) hours as needed for cough.   diazepam 2 MG tablet Commonly known as: VALIUM Take 2 mg by mouth See admin instructions. Take 1 tablet (2mg ) by mouth as directed per [facility].   furosemide  20 MG tablet Commonly known as: LASIX  Take 1 tablet (20 mg total) by mouth 2 (two) times daily.   ipratropium-albuterol  0.5-2.5 (3) MG/3ML Soln Commonly known as: DUONEB Take 3 mLs by nebulization every 6 (six) hours.   levocetirizine 5 MG tablet Commonly known as: XYZAL  Take 5 mg by mouth See admin instructions. Take  1 tablet (5mg ) by mouth every other evening.   Lubriderm Advanced Therapy Crea Apply 1 Application topically 2 (two) times daily as needed (eczema, dry skin).   midodrine  10 MG tablet Commonly known as: PROAMATINE  Take 1 tablet (10 mg total) by mouth 3 (three) times daily. What changed:  medication strength how much to take when to take this additional instructions   multivitamin tablet Take 1 tablet by mouth in the morning.   pantoprazole  40 MG tablet Commonly known as: PROTONIX  Take 40 mg by mouth 2 (two) times daily.   senna-docusate 8.6-50 MG tablet Commonly known as: Senokot-S Take 2 tablets by mouth at bedtime.   sildenafil  20 MG tablet Commonly known as: REVATIO  Take 0.5 tablets (10 mg total) by mouth 3 (three) times daily.    triamcinolone  lotion 0.1 % Commonly known as: KENALOG  Apply 1 Application topically daily as needed (itching).   Vaseline ointment Generic drug: white petrolatum Apply 1 application  topically 2 (two) times daily as needed for dry skin (itching).   Vitron-C 65-125 MG Tabs Generic drug: Iron-Vitamin C Take 1 tablet by mouth in the morning.               Durable Medical Equipment  (From admission, onward)           Start     Ordered   04/12/24 1101  For home use only DME oxygen   Once       Comments: Pulmonary Hypertension, CHF  Question Answer Comment  Length of Need Lifetime   Mode or (Route) Nasal cannula   Liters per Minute 6   Frequency Continuous (stationary and portable oxygen  unit needed)   Oxygen  delivery system: Gas   Oxygen  delivery system: Concentrator   Oxygen  delivery system: Other (comment)   Other: can go up to 10 L/min to keep 02 saturation 88% or greater.      04/12/24 1101            Follow-up Information     [name], [name], DO Follow up.   Specialty: Geriatric Medicine Contact information: [address] [place] [address] [phone]         [facility] Follow up on 04/20/2024.   Specialty: Cardiology Why: at 10:30am [facility], Entrance C Free Valet Parking Available Contact information: [address] [place]  [address] [phone]               Discharge Exam: Fredricka Weights   04/10/24 0300 04/11/24 0413 04/12/24 0300  Weight: 83.1 kg 80.2 kg 83.5 kg   BP (!) 85/54 (BP Location: Left Arm)   Pulse 90   Temp 97.7 F (36.5 C) (Oral)   Resp 17   Ht 5' 1 (1.549 m)   Wt 83.5 kg   SpO2 91%   BMI 34.78 kg/m   Patient with no chest pain and no dyspnea, no PND or orthopnea  Neurology awake and alert ENT with mild pallor Cardiovascular with S1 and S2 present and regular with no gallops rubs or murmurs Respiratory with no rales or  wheezing, no rhonchi  Abdomen with no distention, soft and non tender Trace non pitting lower extremity edema   Condition at discharge: stable  The results of significant diagnostics from this hospitalization (including imaging, microbiology, ancillary and laboratory) are listed below for reference.   Imaging Studies: CARDIAC CATHETERIZATION Result Date: 04/09/2024 RA 6 RV 93/6/11 PA 95/41 mean 60 PCWP 11 AO  92% PA 62% CO 3.3 L/min (Fick) CI 1.85 SVR 15.95 PVR 14.47   DG Chest Port 1 View Result Date: 04/06/2024 CLINICAL DATA:  [id number] Dyspnea [id number] EXAM: PORTABLE CHEST - 1 VIEW COMPARISON:  April 04, 2024 FINDINGS: Similar diffuse interstitial opacities throughout both lungs. No focal airspace consolidation, pleural effusion, or pneumothorax. Moderate cardiomegaly. Tortuous aorta. No acute fracture or destructive lesions. Multilevel thoracic osteophytosis. Right upper quadrant surgical clips. IMPRESSION: Redemonstrated cardiomegaly with little significant interval change to the diffuse interstitial edema. Electronically Signed   By: Rogelia Myers M.D.   On: 04/06/2024 09:44   US  EKG SITE RITE Result Date: 04/05/2024 If Site Rite image not attached, placement could not be confirmed due to current cardiac rhythm.  ECHOCARDIOGRAM COMPLETE Result Date: 04/05/2024    ECHOCARDIOGRAM REPORT   Patient Name:   JONATHON TAN Date of Exam: 04/05/2024 Medical Rec #:  [id number]    Height:       61.0 in Accession #:    [id number]   Weight:       189.6 lb Date of Birth:  Nov 09, 1960     BSA:          1.846 m Patient Age:    63 years     BP:           98/76 mmHg Patient Gender: M            HR:           87 bpm. Exam Location:  Inpatient Procedure: 2D Echo, Cardiac Doppler, Color Doppler and PEDOF (Both Spectral and            Color Flow Doppler were utilized during procedure). Indications:    CHF- Acute Diastolic I50.31  History:        Patient has prior history of Echocardiogram examinations, most                  recent 11/23/2018. Pulmonary HTN, Signs/Symptoms:Edema and                 Dyspnea; Risk Factors:Diabetes, Dyslipidemia, Hypertension and                 CKD.  Sonographer:    Koleen Popper RDCS Referring Phys: [id number] ANASTASSIA DOUTOVA IMPRESSIONS  1. Left ventricular ejection fraction, by estimation, is 60 to 65%. The left ventricle has normal function. The left ventricle has no regional wall motion abnormalities. There is mild concentric left ventricular hypertrophy. Left ventricular diastolic parameters are consistent with Grade II diastolic dysfunction (pseudonormalization). There is the interventricular septum is flattened in systole and diastole, consistent with right ventricular pressure and volume overload.  2. Right ventricular systolic function is normal. The right ventricular size is severely enlarged. Moderately increased right ventricular wall thickness. There is severely elevated pulmonary artery systolic pressure. The estimated right ventricular systolic pressure is 80.6 mmHg.  3. Right atrial size was mildly dilated.  4. The mitral valve is normal in structure. No evidence of mitral valve regurgitation. No evidence of mitral stenosis.  5. The aortic valve is calcified. Aortic valve regurgitation is not visualized. Mild aortic valve stenosis. Aortic valve mean gradient measures 12.5 mmHg. Aortic valve Vmax measures 2.45 m/s.  6. The inferior vena cava is dilated in size with <50% respiratory variability, suggesting right atrial pressure of 15 mmHg. Comparison(s): Changes from prior study are noted. RV is now severely enlarged and there is evidence of RV volume and pressure overload. Mild aortic stenosis is now  seen as well. FINDINGS  Left Ventricle: Left ventricular ejection fraction, by estimation, is 60 to 65%. The left ventricle has normal function. The left ventricle has no regional wall motion abnormalities. The left ventricular internal cavity size was normal in size. There is  mild concentric  left ventricular hypertrophy. The interventricular septum is flattened in systole and diastole, consistent with right ventricular pressure and volume overload. Left ventricular diastolic parameters are consistent with Grade II diastolic dysfunction (pseudonormalization). Right Ventricle: The right ventricular size is severely enlarged. Moderately increased right ventricular wall thickness. Right ventricular systolic function is normal. There is severely elevated pulmonary artery systolic pressure. The tricuspid regurgitant velocity is 4.26 m/s, and with an assumed right atrial pressure of 8 mmHg, the estimated right ventricular systolic pressure is 80.6 mmHg. Left Atrium: Left atrial size was normal in size. Right Atrium: Right atrial size was mildly dilated. Pericardium: There is no evidence of pericardial effusion. Mitral Valve: The mitral valve is normal in structure. No evidence of mitral valve regurgitation. No evidence of mitral valve stenosis. Tricuspid Valve: The tricuspid valve is normal in structure. Tricuspid valve regurgitation is mild . No evidence of tricuspid stenosis. Aortic Valve: The aortic valve is calcified. Aortic valve regurgitation is not visualized. Mild aortic stenosis is present. Aortic valve mean gradient measures 12.5 mmHg. Aortic valve peak gradient measures 23.9 mmHg. Pulmonic Valve: The pulmonic valve was not well visualized. Pulmonic valve regurgitation is mild to moderate. No evidence of pulmonic stenosis. Aorta: The aortic root is normal in size and structure. Venous: The inferior vena cava is dilated in size with less than 50% respiratory variability, suggesting right atrial pressure of 15 mmHg. IAS/Shunts: No atrial level shunt detected by color flow Doppler.  LEFT VENTRICLE PLAX 2D LVIDd:         4.60 cm     Diastology LVIDs:         2.90 cm     LV e' medial:    3.92 cm/s LV PW:         1.10 cm     LV E/e' medial:  17.1 LV IVS:        1.50 cm     LV e' lateral:   4.57 cm/s                             LV E/e' lateral: 14.7  LV Volumes (MOD) LV vol d, MOD A4C: 51.2 ml LV vol s, MOD A4C: 19.0 ml LV SV MOD A4C:     51.2 ml RIGHT VENTRICLE             IVC RV Basal diam:  6.50 cm     IVC diam: 2.40 cm RV Mid diam:    6.00 cm RV S prime:     12.70 cm/s TAPSE (M-mode): 1.3 cm LEFT ATRIUM             Index        RIGHT ATRIUM           Index LA diam:        3.30 cm 1.79 cm/m   RA Area:     20.90 cm LA Vol (A2C):   18.5 ml 10.02 ml/m  RA Volume:   59.50 ml  32.23 ml/m LA Vol (A4C):   24.3 ml 13.16 ml/m LA Biplane Vol: 23.0 ml 12.46 ml/m  AORTIC VALVE AV Vmax:  244.50 cm/s AV Vmean:          166.000 cm/s AV VTI:            0.374 m AV Peak Grad:      23.9 mmHg AV Mean Grad:      12.5 mmHg LVOT Vmax:         108.00 cm/s LVOT Vmean:        74.200 cm/s LVOT VTI:          0.166 m LVOT/AV VTI ratio: 0.44  AORTA Ao Root diam: 3.40 cm MITRAL VALVE               TRICUSPID VALVE MV Area (PHT): 4.60 cm    TR Peak grad:   72.6 mmHg MV Decel Time: 165 msec    TR Vmax:        426.00 cm/s MV E velocity: 67.10 cm/s MV A velocity: 97.50 cm/s  SHUNTS MV E/A ratio:  0.69        Systemic VTI: 0.17 m Joelle Cedars Tonleu Electronically signed by Joelle Cedars Ny Signature Date/Time: 04/05/2024/1:29:49 PM    Final    DG Chest Port 1 View Result Date: 04/04/2024 EXAM: 1 AP VIEW XRAY OF THE CHEST 04/04/2024 04:27:00 PM COMPARISON: 03/26/2024. CLINICAL HISTORY: Questionable sepsis - evaluate for abnormality. FINDINGS: LUNGS AND PLEURA: Interstitial prominence may reflect early interstitial edema. No pleural effusion. No pneumothorax. HEART AND MEDIASTINUM: Cardiomegaly, vascular congestion. BONES AND SOFT TISSUES: No acute osseous abnormality. IMPRESSION: 1. Cardiomegaly with vascular congestion. 2. Interstitial prominence concerning for early interstitial edema. Electronically signed by: Franky Crease MD 04/04/2024 05:12 PM EST RP Workstation: [id number]   DG Chest 2 View Result Date:  03/26/2024 CLINICAL DATA:  acute cough / led edema. EXAM: CHEST - 2 VIEW COMPARISON:  07/13/2022. FINDINGS: There are heterogeneous nonspecific opacities overlying the bilateral lung bases. Findings are nonspecific and may be due to crowding of underlying vessels however, focal areas of pneumonitis can not be excluded on this exam. Correlate clinically to determine the need for additional imaging with chest CT scan. No frank pulmonary edema. Bilateral lung fields are otherwise clear. Bilateral costophrenic angles are clear. Stable cardio-mediastinal silhouette. No acute osseous abnormalities. The soft tissues are within normal limits. There are surgical clips in the right upper quadrant, typical of a previous cholecystectomy. IMPRESSION: *Heterogeneous nonspecific opacities overlying the bilateral lung bases. Findings are nonspecific and may be due to crowding of underlying vessels however, focal areas of pneumonitis can not be excluded on this exam. Electronically Signed   By: Ree Molt M.D.   On: 03/26/2024 13:12    Microbiology: Results for orders placed or performed during the hospital encounter of 04/04/24  Blood Culture (routine x 2)     Status: None   Collection Time: 04/04/24  4:09 PM   Specimen: BLOOD  Result Value Ref Range Status   Specimen Description BLOOD SITE NOT SPECIFIED  Final   Special Requests   Final    BOTTLES DRAWN AEROBIC AND ANAEROBIC Blood Culture results may not be optimal due to an inadequate volume of blood received in culture bottles   Culture   Final    NO GROWTH 5 DAYS Performed at [facility], [address], [place], [place] [address]    Report Status 04/09/2024 FINAL  Final  Resp panel by RT-PCR (RSV, Flu A&B, Covid)     Status: None   Collection Time: 04/04/24  4:09 PM   Specimen: Nasal Swab  Result Value Ref Range Status   SARS Coronavirus 2 by RT PCR NEGATIVE NEGATIVE Final   Influenza A by PCR NEGATIVE NEGATIVE Final   Influenza B by PCR  NEGATIVE NEGATIVE Final    Comment: (NOTE) The Xpert Xpress SARS-CoV-2/FLU/RSV plus assay is intended as an aid in the diagnosis of influenza from Nasopharyngeal swab specimens and should not be used as a sole basis for treatment. Nasal washings and aspirates are unacceptable for Xpert Xpress SARS-CoV-2/FLU/RSV testing.  Fact Sheet for Patients: bloggercourse.com  Fact Sheet for Healthcare Providers: seriousbroker.it  This test is not yet approved or cleared by the [place]  FDA and has been authorized for detection and/or diagnosis of SARS-CoV-2 by FDA under an Emergency Use Authorization (EUA). This EUA will remain in effect (meaning this test can be used) for the duration of the COVID-19 declaration under Section 564(b)(1) of the Act, 21 U.S.C. section 360bbb-3(b)(1), unless the authorization is terminated or revoked.     Resp Syncytial Virus by PCR NEGATIVE NEGATIVE Final    Comment: (NOTE) Fact Sheet for Patients: bloggercourse.com  Fact Sheet for Healthcare Providers: seriousbroker.it  This test is not yet approved or cleared by the [place]  FDA and has been authorized for detection and/or diagnosis of SARS-CoV-2 by FDA under an Emergency Use Authorization (EUA). This EUA will remain in effect (meaning this test can be used) for the duration of the COVID-19 declaration under Section 564(b)(1) of the Act, 21 U.S.C. section 360bbb-3(b)(1), unless the authorization is terminated or revoked.  Performed at [facility], [address], [place], [place] [address]   Blood Culture (routine x 2)     Status: None   Collection Time: 04/04/24  4:15 PM   Specimen: BLOOD  Result Value Ref Range Status   Specimen Description BLOOD SITE NOT SPECIFIED  Final   Special Requests   Final    BOTTLES DRAWN AEROBIC AND ANAEROBIC Blood Culture results may not be optimal due  to an inadequate volume of blood received in culture bottles   Culture   Final    NO GROWTH 5 DAYS Performed at [facility], [address], [place], [place] [address]    Report Status 04/09/2024 FINAL  Final    Labs: CBC: Recent Labs  Lab 04/07/24 0245 04/08/24 0222 04/09/24 0246 04/09/24 1738 04/10/24 0224 04/11/24 0216  WBC 13.1* 11.9* 10.9*  --  11.7* 9.4  HGB 20.0* 19.4* 17.6* 17.7* 18.5* 17.2*  HCT 60.4* 58.4* 53.1* 52.0 56.6* 53.3*  MCV 101.3* 100.0 100.6*  --  101.8* 102.7*  PLT 120* 86* 92*  --  117* 89*   Basic Metabolic Panel: Recent Labs  Lab 04/08/24 0222 04/09/24 0246 04/09/24 1738 04/10/24 0223 04/10/24 0224 04/11/24 0216 04/12/24 0230  NA 139 138 138  --  138 140 141  K 4.1 4.1 3.9  --  3.6 4.1 4.5  CL 88* 90*  --   --  89* 94* 97*  CO2 37* 41*  --   --  36* 38* 42*  GLUCOSE 143* 130*  --   --  105* 106* 109*  BUN 30* 43*  --   --  56* 52* 50*  CREATININE 1.56* 1.92*  --   --  1.98* 1.63* 1.84*  CALCIUM  8.3* 7.9*  --   --  7.7* 7.9* 8.2*  MG  --   --   --  2.4  --   --   --   PHOS 3.4 4.1  --   --  4.2 3.2 3.8   Liver Function Tests: Recent Labs  Lab 04/08/24 0222 04/09/24 0246 04/10/24 0224 04/11/24 0216 04/12/24 0230  ALBUMIN  2.7* 2.3* 2.5* 2.3* 2.4*   CBG: Recent Labs  Lab 04/11/24 0541 04/11/24 1120 04/11/24 1624 04/11/24 2131 04/12/24 0601  GLUCAP 115* 189* 141* 201* 126*    Discharge time spent: greater than 30 minutes.  Signed: Elidia Toribio Furnace, MD [facility] 04/12/2024

## 2024-04-19 ENCOUNTER — Telehealth (HOSPITAL_COMMUNITY): Payer: Self-pay

## 2024-04-19 NOTE — Telephone Encounter (Signed)
 Called to confirm/remind patient of their appointment at [facility] on 04/20/24 10:30.   Appointment:   [] Confirmed  [x] Left mess   [] No answer/No voice mail  [] VM Full/unable to leave message  [] Phone not in service  Patient reminded to bring all medications and/or complete list.  Confirmed patient has transportation. Gave directions, instructed to utilize valet parking.

## 2024-04-20 ENCOUNTER — Encounter (HOSPITAL_COMMUNITY): Payer: Self-pay

## 2024-04-20 ENCOUNTER — Ambulatory Visit (HOSPITAL_COMMUNITY)
Admit: 2024-04-20 | Discharge: 2024-04-20 | Disposition: A | Discharge status: Home / Self Care | Payer: Medicare (Managed Care) | Source: Ambulatory Visit | Attending: Cardiology | Admitting: Cardiology

## 2024-04-20 VITALS — BP 92/67 | HR 88 | Ht 61.0 in | Wt 186.0 lb

## 2024-04-20 DIAGNOSIS — I2510 Atherosclerotic heart disease of native coronary artery without angina pectoris: Secondary | ICD-10-CM | POA: Insufficient documentation

## 2024-04-20 DIAGNOSIS — I2781 Cor pulmonale (chronic): Secondary | ICD-10-CM | POA: Diagnosis not present

## 2024-04-20 DIAGNOSIS — Z560 Unemployment, unspecified: Secondary | ICD-10-CM | POA: Insufficient documentation

## 2024-04-20 DIAGNOSIS — Z9981 Dependence on supplemental oxygen: Secondary | ICD-10-CM | POA: Insufficient documentation

## 2024-04-20 DIAGNOSIS — N183 Chronic kidney disease, stage 3 unspecified: Secondary | ICD-10-CM | POA: Insufficient documentation

## 2024-04-20 DIAGNOSIS — J9611 Chronic respiratory failure with hypoxia: Secondary | ICD-10-CM | POA: Insufficient documentation

## 2024-04-20 DIAGNOSIS — Z79899 Other long term (current) drug therapy: Secondary | ICD-10-CM | POA: Diagnosis not present

## 2024-04-20 DIAGNOSIS — Z8249 Family history of ischemic heart disease and other diseases of the circulatory system: Secondary | ICD-10-CM | POA: Diagnosis not present

## 2024-04-20 DIAGNOSIS — G4733 Obstructive sleep apnea (adult) (pediatric): Secondary | ICD-10-CM | POA: Insufficient documentation

## 2024-04-20 DIAGNOSIS — I5032 Chronic diastolic (congestive) heart failure: Secondary | ICD-10-CM | POA: Insufficient documentation

## 2024-04-20 DIAGNOSIS — I130 Hypertensive heart and chronic kidney disease with heart failure and stage 1 through stage 4 chronic kidney disease, or unspecified chronic kidney disease: Secondary | ICD-10-CM | POA: Diagnosis present

## 2024-04-20 DIAGNOSIS — I272 Pulmonary hypertension, unspecified: Secondary | ICD-10-CM | POA: Diagnosis not present

## 2024-04-20 NOTE — Progress Notes (Signed)
 "  Advanced Heart Failure Clinic Note    PCP: [name] Annabella CROME, DO PCP-Cardiologist: Alm Clay, MD  AHF: Dr. Rolan   Chief Complaint: f/u for PH/ RV failure  HPI:  Tony Jones is a 63 y.o. male with history of developmental delay, pulmonary hypertension with right ventricular heart failure, chronic diastolic heart failure, severe OSA/OHS and inability to tolerate CPAP, on home O2, pulmonary arterial hypertension, hypertension however now hypotensive requiring midodrine . Last seen in AHF Clinic 07/2018. Has been followed by Dr. Neda, Pulmnology for pHTN and annually by Dr. Clay for chronic HFpEF. [facility]    He lives with his mother who takes care of him and helps with ADLs.   Admitted in 6/19 with acute on chronic diastolic CHF with prominent RV failure. RHC showed elevated right and left heart filling pressures with severe pulmonary arterial hypertension.  Patient is on high flow oxygen  for OHS, he cannot tolerate CPAP. He was diuresed extensively.  He had upper GI bleeding.      Admitted 8/19 with cholelithiasis without evidence for cholecystitis treated with conservative therapy.   Presented to [facility] 04/04/24 from [facility]. Had been experiencing a cough x3 weeks, initially treated with antibiotics for pneumonia. While at home with pneumonia, his eating and drinking habits are very uncontrolled, per his father. Over the last week had decline in appetite and mentation. Has continued to struggle with CPAP use and continuous O2 during the day. On presentation hypoxic, SpO2 83% on 3L (on 4L at home) he was then placed on 12L NRB with improvement, BP 111/81, HE in 80s. ECG showed NSR. CXR showed cardiomegaly and congestion. Labs in ED notable for WBC 11.8, hgb 19.5, Cr 2.13, BNP 1097, hs-trop 31>31>34. TTE showed EF 60-65%, mild LVH, G2DD, nl RV functoin, RVSP 80.6 mmHg, mild AS with meanG 12.5 mmHg, Vmax 2.5 m/s, dilated IVC. Cardiology was consulted, switched to  high dose IV Lasix . Midodrine  increased due to persistent hypotension.   He underwent RHC 04/09/24 showing severe pulmonary hypertension with normal filling pressures and low cardiac output. (RA 6, PA 95/41 (60), PCWP 11, CO/CI (fick) 3.3/1.85, PVR 14.47). AHF team consulted post RHC. IV Lasix  was discontinued and he was transitioned to PO lasix . Sildenafil  started for PH. He did not tolerated the 20 mg dose of sildenafil  d/t hypotension. This was reduced to 10 mg and he tolerated better and overall symptoms improved. He was instructed at discharge to monitor BP at home and to hold sildenafil  if SBP < 80. Discharge wt 183 lb.   He presents today for post hospital f/u. Here w/ his mother. Doing well. Weight relatively stable, 186 lb in clinic today. Breathing stable. No increased O2 requirements. Remains on 4L/min.   BP 92/67. Reports compliance w/ midodrine . No syncope/near syncope. Has TED hoses at home but not wearing.     Review of Systems: [y] = yes, [ ]  = no   General: Weight gain [ ] ; Weight loss [ ] ; Anorexia [ ] ; Fatigue [ ] ; Fever [ ] ; Chills [ ] ; Weakness [ ]   Cardiac: Chest pain/pressure [ ] ; Resting SOB [ ] ; Exertional SOB [ ] ; Orthopnea [ ] ; Pedal Edema [ ] ; Palpitations [ ] ; Syncope [ ] ; Presyncope [ ] ; Paroxysmal nocturnal dyspnea[ ]   Pulmonary: Cough [ ] ; Wheezing[ ] ; Hemoptysis[ ] ; Sputum [ ] ; Snoring [ ]   GI: Vomiting[ ] ; Dysphagia[ ] ; Melena[ ] ; Hematochezia [ ] ; Heartburn[ ] ; Abdominal pain [ ] ; Constipation [ ] ;  Diarrhea [ ] ; BRBPR [ ]   GU: Hematuria[ ] ; Dysuria [ ] ; Nocturia[ ]   Vascular: Pain in legs with walking [ ] ; Pain in feet with lying flat [ ] ; Non-healing sores [ ] ; Stroke [ ] ; TIA [ ] ; Slurred speech [ ] ;  Neuro: Headaches[ ] ; Vertigo[ ] ; Seizures[ ] ; Paresthesias[ ] ;Blurred vision [ ] ; Diplopia [ ] ; Vision changes [ ]   Ortho/Skin: Arthritis [ ] ; Joint pain [ ] ; Muscle pain [ ] ; Joint swelling [ ] ; Back Pain [ ] ; Rash [ ]   Psych: Depression[ ] ; Anxiety[ ]   Heme:  Bleeding problems [ ] ; Clotting disorders [ ] ; Anemia [ ]   Endocrine: Diabetes [ ] ; Thyroid  dysfunction[ ]    Past Medical History:  Diagnosis Date   Anemia    Anxiety    Barrett esophagus    Cholelithiasis    Chronic respiratory failure with hypoxia (HCC)    CKD (chronic kidney disease) stage 3, GFR 30-59 ml/min (HCC)    Developmental delay    Diabetes mellitus type 2, uncontrolled    Dyslipidemia    Gallstones 01/2019   GERD (gastroesophageal reflux disease)    Hypertension    Mental disorder    Chronic developmental delay.; still lives with mother as primary care giver; spends 2-3 days in adult day-care.   Nephrolithiasis    Pulmonary hypertension (HCC)    Likely related to long-standing hypoxia and possible OSA/OHS (group 3) & ~DHF (Group 2); -- No longer on Pulmnoary Vasodilator (family request)   Right heart failure (HCC)    Recovered as on July 2020    Current Outpatient Medications  Medication Sig Dispense Refill   allopurinol  (ZYLOPRIM ) 300 MG tablet Take 300 mg by mouth in the morning.     atorvastatin  (LIPITOR) 40 MG tablet Take 1 tablet (40 mg total) by mouth daily at 6 PM. 30 tablet 6   citalopram  (CELEXA ) 20 MG tablet Take 20 mg by mouth in the morning.     dextromethorphan-guaiFENesin  (ROBITUSSIN-DM) 10-100 MG/5ML liquid Take 10 mLs by mouth every 4 (four) hours as needed for cough.     Emollient (LUBRIDERM ADVANCED THERAPY) CREA Apply 1 Application topically 2 (two) times daily as needed (eczema, dry skin).     ipratropium-albuterol  (DUONEB) 0.5-2.5 (3) MG/3ML SOLN Take 3 mLs by nebulization every 6 (six) hours.     Iron-Vitamin C (VITRON-C) 65-125 MG TABS Take 1 tablet by mouth in the morning.     levocetirizine (XYZAL ) 5 MG tablet Take 5 mg by mouth See admin instructions. Take 1 tablet (5mg ) by mouth every other evening.     midodrine  (PROAMATINE ) 10 MG tablet Take 1 tablet (10 mg total) by mouth 3 (three) times daily. 90 tablet 0   Multiple Vitamin  (MULTIVITAMIN) tablet Take 1 tablet by mouth in the morning.     pantoprazole  (PROTONIX ) 40 MG tablet Take 40 mg by mouth 2 (two) times daily.     senna-docusate (SENOKOT-S) 8.6-50 MG tablet Take 2 tablets by mouth at bedtime.     sildenafil  (REVATIO ) 20 MG tablet Take 0.5 tablets (10 mg total) by mouth 3 (three) times daily. 45 tablet 0   triamcinolone  lotion (KENALOG ) 0.1 % Apply 1 Application topically daily as needed (itching).     white petrolatum (VASELINE) ointment Apply 1 application  topically 2 (two) times daily as needed for dry skin (itching).     diazepam (VALIUM) 2 MG tablet Take 2 mg by mouth See admin instructions. Take 1 tablet (2mg ) by mouth as  directed per [facility]. (Patient not taking: Reported on 04/20/2024)     No current facility-administered medications for this encounter.    Allergies[1]    Social History   Socioeconomic History   Marital status: Single    Spouse name: Not on file   Number of children: Not on file   Years of education: Not on file   Highest education level: Not on file  Occupational History   Occupation: unemployed  Tobacco Use   Smoking status: Never   Smokeless tobacco: Never  Vaping Use   Vaping status: Never Used  Substance and Sexual Activity   Alcohol  use: No   Drug use: No   Sexual activity: Never  Other Topics Concern   Not on file  Social History Narrative   Not on file   Social Drivers of Health   Tobacco Use: Low Risk (04/20/2024)   Patient History    Smoking Tobacco Use: Never    Smokeless Tobacco Use: Never    Passive Exposure: Not on file  Financial Resource Strain: Not on file  Food Insecurity: No Food Insecurity (04/05/2024)   Epic    Worried About Programme Researcher, Broadcasting/film/video in the Last Year: Never true    Ran Out of Food in the Last Year: Never true  Transportation Needs: No Transportation Needs (04/05/2024)   Epic    Lack of Transportation (Medical): No    Lack of Transportation (Non-Medical): No   Physical Activity: Not on file  Stress: Not on file  Social Connections: Not on file  Intimate Partner Violence: Patient Unable To Answer (04/05/2024)   Epic    Fear of Current or Ex-Partner: Patient unable to answer    Emotionally Abused: Patient unable to answer    Physically Abused: Patient unable to answer    Sexually Abused: Patient unable to answer  Depression (PHQ2-9): Not on file  Alcohol  Screen: Not on file  Housing: Low Risk (04/05/2024)   Epic    Unable to Pay for Housing in the Last Year: No    Number of Times Moved in the Last Year: 0    Homeless in the Last Year: No  Utilities: Not At Risk (04/05/2024)   Epic    Threatened with loss of utilities: No  Health Literacy: Not on file      Family History  Problem Relation Age of Onset   Congestive Heart Failure Father    Heart disease Father    Heart attack Father    Atrial fibrillation Father    Brain cancer Father    Melanoma Father    Diabetes Mother    Stroke Mother    Hypertension Mother    Fainting Paternal Grandfather    Congestive Heart Failure Paternal Grandfather    Atrial fibrillation Paternal Grandfather     Vitals:   04/20/24 1030  BP: 92/67  Pulse: 88  SpO2: 93%  Weight: 84.4 kg (186 lb)  Height: 5' 1 (1.549 m)     PHYSICAL EXAM: General:  chronically ill appearing. No respiratory difficulty HEENT: normal Neck: supple. no JVD. Carotids 2+ bilat; no bruits. No lymphadenopathy or thyromegaly appreciated. Cor: PMI nondisplaced. Regular rate & rhythm. No rubs, gallops or murmurs. Lungs: clear Abdomen: soft, nontender, nondistended. No hepatosplenomegaly. No bruits or masses. Good bowel sounds. Extremities: no cyanosis, clubbing, rash, trace b/l LEE L>R  Neuro: alert & oriented x 3, cranial nerves grossly intact. moves all 4 extremities w/o difficulty. Affect pleasant.  ECG: not performed  ASSESSMENT & PLAN:  1. Pulmonary hypertension with RV failure/cor pulmonale and Chronic Hypoxic  Respiratory Failure: High resolution CT chest 2019 was not suggestive of ILD.  V/Q scan did not show chronic PEs in 2016. Most recent Echo 12/25 EF 60-65%, RVSP 80.6. RHC 12/25 with stable severe PAH 95/41 (60) from PA 93/51 (67) in 10/2017 (RA 6, PCWP 11, CI 1.85, PVR 14.47).  Suspect that the pulmonary hypertension is a combination of group I/III PH (OHS/OSA). Now on Sildenafil  10 tid (failed 20 mg dose d/t hypotension). Breathing improved. Chronic NYHA Class II-III confounded by chronic hypoxic respiratory failure. Volume status ok.  - continue sildenafil  10 mg tid - continue supp O2, He is on 4L oxygen , should wear at all times, however only wears when feels bad. He has CPAP but cannot tolerate.    - continue midodrine  10 mg tid for BP support. Encouraged use of TED hoses  - Continue Lasix  40 mg bid. I recommended obtaining BMP today but pt refused  - Mother informed to contact us  if worsening respiratory status/needed increase in O2 support, wt gain or increased LEE    2. Chronic diastolic CHF: - Stable EF 12/25 EF 60-65%.   - NYHA Class II-III - Euvolemic on exam  - continue Lasix  40 mg bid   3. CKD: Stage 3. - baseline Cr ~2.0 - recommended BMP today but pt refused    4. 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 - denies CP   - Continue atorvastatin  40 mg daily. LDL 36 - no ASA with history of GIB   F/u w/ Dr. Rolan in 2 months  Ilene Witcher Marcine, PA-C 04/20/2024      [1]  Allergies Allergen Reactions   Glucophage  [Metformin ] Itching   Codeine Nausea And Vomiting   "

## 2024-04-20 NOTE — Patient Instructions (Addendum)
 Medication Changes:  NONE   Lab Work:  NONE TODAY    Follow-Up in: 2 MONTHS WITH MCLEAN AS SCHEDULED.   At the Advanced [facility] Failure Clinic, you and your health needs are our priority. We have a designated team specialized in the treatment of Heart Failure. This Care Team includes your primary Heart Failure Specialized Cardiologist (physician), Advanced Practice Providers (APPs- Physician Assistants and Nurse Practitioners), and Pharmacist who all work together to provide you with the care you need, when you need it.   You may see any of the following providers on your designated Care Team at your next follow up:  Dr. Toribio Fuel Dr. Ezra Shuck Dr. Odis Brownie Greig Mosses, NP Caffie Shed, [place] [name] [place], [place] Beckey Coe, NP Jordan Lee, NP Tinnie Redman, PharmD   Please be sure to bring in all your medications bottles to every appointment.   Need to Contact Us :  If you have any questions or concerns before your next appointment please send us  a message through [employer] or call our office at [phone].    TO LEAVE A MESSAGE FOR THE NURSE SELECT OPTION 2, PLEASE LEAVE A MESSAGE INCLUDING: YOUR NAME DATE OF BIRTH CALL BACK NUMBER REASON FOR CALL**this is important as we prioritize the call backs  YOU WILL RECEIVE A CALL BACK THE SAME DAY AS LONG AS YOU CALL BEFORE 4:00 PM

## 2024-05-23 NOTE — Progress Notes (Signed)
 "  Advanced Heart Failure Clinic Note    PCP: [name] Annabella CROME, DO Primary Cardiologist: Alm Clay, MD  HF Cardiology: Dr. Rolan   HPI: Tony Jones is a 64 y.o. male with history of developmental delay, pulmonary hypertension with right ventricular heart failure, chronic diastolic heart failure, severe OSA/OHS and inability to tolerate CPAP, on home O2, pulmonary arterial hypertension, hypertension however now hypotensive requiring midodrine .   Last seen in AHF Clinic 07/2018. Has been followed by Dr. Neda, Pulmnology for pHTN and annually by Dr. Clay for chronic HFpEF. [facility].    He lives with his mother who takes care of him and helps with ADLs.   Admitted in 6/19 with acute on chronic diastolic CHF with prominent RV failure. RHC showed elevated right and left heart filling pressures with severe pulmonary arterial hypertension.  Patient is on high flow oxygen  for OHS, he cannot tolerate CPAP. He was diuresed extensively.  He had upper GI bleeding.      Admitted 8/19 with cholelithiasis without evidence for cholecystitis treated with conservative therapy.   Presented to [facility] 04/04/24 from [facility]. Had been experiencing a cough x3 weeks, initially treated with antibiotics for pneumonia. While at home with pneumonia, his eating and drinking habits are very uncontrolled, per his father. He had decline in appetite and mentation. Has continued to struggle with CPAP use and continuous O2 during the day. On presentation hypoxic, SpO2 83% on 3L (on 4L at home) he was then placed on 12L NRB with improvement, BP 111/81, HE in 80s. ECG showed NSR. CXR showed cardiomegaly and congestion. Labs in ED notable for WBC 11.8, hgb 19.5, Cr 2.13, BNP 1097, hs-trop 31>31>34. TTE showed EF 60-65%, mild LVH, G2DD, nl RV functoin, RVSP 80.6 mmHg, mild AS with meanG 12.5 mmHg, Vmax 2.5 m/s, dilated IVC. Cardiology was consulted, switched to high dose IV Lasix . Midodrine  increased due  to persistent hypotension. He underwent RHC showing severe pulmonary hypertension with normal filling pressures and low cardiac output. (RA 6, PA 95/41 (60), PCWP 11, CO/CI (fick) 3.3/1.85, PVR 14.47). AHF team consulted post RHC. IV Lasix  was discontinued and he was transitioned to PO lasix . Sildenafil  started for [facility]. He did not tolerated the 20 mg dose of sildenafil  d/t hypotension. This was reduced to 10 mg and he tolerated better and overall symptoms improved. He was instructed at discharge to monitor BP at home and to hold sildenafil  if SBP < 80. Discharge wt 183 lbs.   Today he returns for post hospital HF follow up with his brother and mother. Overall feeling fine. He is not SOB walking on flat ground or with ADLs. Continues on 4L oxygen . Main complaint is legs are aching, symptoms improve with elevation. Denies palpitations, abnormal bleeding, CP, dizziness, edema, or PND/Orthopnea. Appetite ok. Taking all medications, currently on Lasix  20 bid. Active with [facility]. Does not wear CPAP  Labs (12/25): K 4.1, creatinine 1.63, LDL 36  PMH: 1. Developmental delay.  2. CKD: Stage 3.  3. Barretts esophagus with GERD 4. Cholelithiasis 5. Nephrolithiasis 6. OHS/OSA: Wears high flow oxygen  at all times, cannot tolerate CPAP.  7. Type II diabetes.  8. HTN 9. Hyperlipidemia 10. Upper GI bleeding: Capsule study in 6/19 was negative.  11. Coronary disease: Nonobstructive.  - LHC (6/19) with 50% ostial PDA, otherwise luminal irregularities.  12. Fe deficiency anemia: Due to GI blood losses.  13. Pulmonary hypertension with RV failure: Suspect primarily group  3 PH due to OHS/OSA.   - RHC (6/19): mean RA 16, PA 93/51 mean 67, mean 27, CI 2.65, PVR 8 WU - V/Q scan (2016): No evidence for chronic PE.  - High resolution chest CT (6/19): no definite ILD.  - Echo (5/19): EF 60-65%, mild LVH, septal flattening with RV failure, PASP 75 mmhg.  - Echo (7/20): EF 55-60%, RV normal - Echo (12/25): EF  60-65%, mild LVH, G2DD, interventricular septum is flattened in systole and diastole, consistent with RV pressure and volume overload, RV normal with severely elevated RVSP 80.6 mmHg. - RHC (12/25): RA6, PA 95/41 (60), PCWP 11, CO/CI (Fick) 3.3/1.85, PVR 14.47 14. Chronic diastolic CHF 15. ABIs (9/17): normal 16. Cholelithiasis   Past Medical History:  Diagnosis Date   Anemia    Anxiety    Barrett esophagus    Cholelithiasis    Chronic respiratory failure with hypoxia (HCC)    CKD (chronic kidney disease) stage 3, GFR 30-59 ml/min (HCC)    Developmental delay    Diabetes mellitus type 2, uncontrolled    Dyslipidemia    Gallstones 01/2019   GERD (gastroesophageal reflux disease)    Hypertension    Mental disorder    Chronic developmental delay.; still lives with mother as primary care giver; spends 2-3 days in adult day-care.   Nephrolithiasis    Pulmonary hypertension (HCC)    Likely related to long-standing hypoxia and possible OSA/OHS (group 3) & ~DHF (Group 2); -- No longer on Pulmnoary Vasodilator (family request)   Right heart failure (HCC)    Recovered as on July 2020    Current Outpatient Medications  Medication Sig Dispense Refill   allopurinol  (ZYLOPRIM ) 300 MG tablet Take 300 mg by mouth in the morning.     atorvastatin  (LIPITOR) 40 MG tablet Take 1 tablet (40 mg total) by mouth daily at 6 PM. 30 tablet 6   citalopram  (CELEXA ) 20 MG tablet Take 20 mg by mouth in the morning.     Emollient (LUBRIDERM ADVANCED THERAPY) CREA Apply 1 Application topically 2 (two) times daily as needed (eczema, dry skin).     ipratropium-albuterol  (DUONEB) 0.5-2.5 (3) MG/3ML SOLN Take 3 mLs by nebulization every 6 (six) hours.     Iron-Vitamin C (VITRON-C) 65-125 MG TABS Take 1 tablet by mouth in the morning.     levocetirizine (XYZAL ) 5 MG tablet Take 5 mg by mouth See admin instructions. Take 1 tablet (5mg ) by mouth every other evening.     midodrine  (PROAMATINE ) 10 MG tablet Take 1  tablet (10 mg total) by mouth 3 (three) times daily. 90 tablet 0   Multiple Vitamin (MULTIVITAMIN) tablet Take 1 tablet by mouth in the morning.     pantoprazole  (PROTONIX ) 40 MG tablet Take 40 mg by mouth 2 (two) times daily.     senna-docusate (SENOKOT-S) 8.6-50 MG tablet Take 2 tablets by mouth at bedtime.     sildenafil  (REVATIO ) 20 MG tablet Take 0.5 tablets (10 mg total) by mouth 3 (three) times daily. 45 tablet 0   triamcinolone  lotion (KENALOG ) 0.1 % Apply 1 Application topically daily as needed (itching).     white petrolatum (VASELINE) ointment Apply 1 application  topically 2 (two) times daily as needed for dry skin (itching).     dextromethorphan-guaiFENesin  (ROBITUSSIN-DM) 10-100 MG/5ML liquid Take 10 mLs by mouth every 4 (four) hours as needed for cough. (Patient not taking: Reported on 05/25/2024)     diazepam (VALIUM) 2 MG tablet Take 2 mg by  mouth See admin instructions. Take 1 tablet (2mg ) by mouth as directed per [facility]. (Patient not taking: Reported on 05/25/2024)     No current facility-administered medications for this encounter.    Allergies[1]    Social History   Socioeconomic History   Marital status: Single    Spouse name: Not on file   Number of children: Not on file   Years of education: Not on file   Highest education level: Not on file  Occupational History   Occupation: unemployed  Tobacco Use   Smoking status: Never   Smokeless tobacco: Never  Vaping Use   Vaping status: Never Used  Substance and Sexual Activity   Alcohol  use: No   Drug use: No   Sexual activity: Never  Other Topics Concern   Not on file  Social History Narrative   Not on file   Social Drivers of Health   Tobacco Use: Low Risk (05/25/2024)   Patient History    Smoking Tobacco Use: Never    Smokeless Tobacco Use: Never    Passive Exposure: Not on file  Financial Resource Strain: Not on file  Food Insecurity: No Food Insecurity (04/05/2024)   Epic    Worried  About Programme Researcher, Broadcasting/film/video in the Last Year: Never true    Ran Out of Food in the Last Year: Never true  Transportation Needs: No Transportation Needs (04/05/2024)   Epic    Lack of Transportation (Medical): No    Lack of Transportation (Non-Medical): No  Physical Activity: Not on file  Stress: Not on file  Social Connections: Not on file  Intimate Partner Violence: Patient Unable To Answer (04/05/2024)   Epic    Fear of Current or Ex-Partner: Patient unable to answer    Emotionally Abused: Patient unable to answer    Physically Abused: Patient unable to answer    Sexually Abused: Patient unable to answer  Depression (PHQ2-9): Not on file  Alcohol  Screen: Not on file  Housing: Low Risk (04/05/2024)   Epic    Unable to Pay for Housing in the Last Year: No    Number of Times Moved in the Last Year: 0    Homeless in the Last Year: No  Utilities: Not At Risk (04/05/2024)   Epic    Threatened with loss of utilities: No  Health Literacy: Not on file      Family History  Problem Relation Age of Onset   Congestive Heart Failure Father    Heart disease Father    Heart attack Father    Atrial fibrillation Father    Brain cancer Father    Melanoma Father    Diabetes Mother    Stroke Mother    Hypertension Mother    Fainting Paternal Grandfather    Congestive Heart Failure Paternal Grandfather    Atrial fibrillation Paternal Grandfather    Wt Readings from Last 3 Encounters:  05/25/24 84.7 kg (186 lb 12.8 oz)  04/20/24 84.4 kg (186 lb)  04/12/24 83.5 kg (184 lb 1.4 oz)   BP 102/70   Pulse 98   Ht 5' 1 (1.549 m)   Wt 84.7 kg (186 lb 12.8 oz)   SpO2 93%   BMI 35.30 kg/m   PHYSICAL EXAM: General:  NAD. No resp difficulty, walked into clinic on oxygen , appears younger than stated age HEENT: Normal Neck: Supple. Thick neck Cor: Regular rate & rhythm. No rubs, gallops or murmurs. Lungs: Clear Abdomen: Soft, nontender, nondistended.  Extremities: No  cyanosis, clubbing, rash,  1+ BLE edema Neuro: Alert & oriented x 3, moves all 4 extremities w/o difficulty. Affect pleasant.  ASSESSMENT & PLAN: 1. Pulmonary hypertension with RV failure/cor pulmonale and Chronic Hypoxic Respiratory Failure: High resolution CT chest 2019 was not suggestive of ILD.  V/Q scan did not show chronic PEs in 2016. Most recent Echo 12/25 EF 60-65%, RVSP 80.6. RHC 12/25 with stable severe PAH 95/41 (60) from PA 93/51 (67) in 10/2017 (RA 6, PCWP 11, CI 1.85, PVR 14.47).  Suspect that the pulmonary hypertension is a combination of group I/III PH (OHS/OSA). Now on Sildenafil  10 tid (failed 20 mg dose d/t hypotension). Breathing improved. NYHA II-early III, confounded by chronic hypoxic respiratory failure. He is mildly volume overloaded today, weight up 3 lbs - Place compression hose - Increase Lasix  back to 40 mg bid, add 10 KCL daily. BMET today, repeat BMET in 10-14 days. - Continue sildenafil  10 mg tid. - Continue supp O2.  - Continue midodrine  10 mg tid for BP support.  2. Chronic diastolic CHF: Stable EF 12/25 EF 60-65%.  NYHA II-early III, mildly volume overloaded today. - Increase Lasix  to 40 mg bid, add 10 KCL daily. BMET today 3. CKD: Stage 3: Baseline SCr 2. - baseline Cr ~2.0 4. 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 No chest pain - Continue atorvastatin  40 mg daily. LDL 36 - no ASA with history of GIB   Follow up with Dr. Rolan, as scheduled.  Harlene HERO Grimes, FNP 05/25/24      [1]  Allergies Allergen Reactions   Glucophage  [Metformin ] Itching   Codeine Nausea And Vomiting   "

## 2024-05-24 ENCOUNTER — Telehealth (HOSPITAL_COMMUNITY): Payer: Self-pay

## 2024-05-24 NOTE — Telephone Encounter (Signed)
 Called and spoke to pt's mother Earnie to confirm/remind patient of their appointment at [facility] on 05/25/24.   Appointment:   [x] Confirmed  [] Left mess   [] No answer/No voice mail  [] VM Full/unable to leave message  [] Phone not in service  Patient reminded to bring all medications and/or complete list.  Confirmed patient has transportation. Gave directions, instructed to utilize valet parking.

## 2024-05-25 ENCOUNTER — Ambulatory Visit (HOSPITAL_COMMUNITY): Payer: Self-pay | Admitting: Family Medicine

## 2024-05-25 ENCOUNTER — Ambulatory Visit (HOSPITAL_COMMUNITY)
Admission: RE | Admit: 2024-05-25 | Discharge: 2024-05-25 | Disposition: A | Payer: Medicare (Managed Care) | Source: Ambulatory Visit | Attending: Family Medicine

## 2024-05-25 ENCOUNTER — Encounter (HOSPITAL_COMMUNITY): Payer: Self-pay

## 2024-05-25 VITALS — BP 102/70 | HR 98 | Ht 61.0 in | Wt 186.8 lb

## 2024-05-25 DIAGNOSIS — Z9981 Dependence on supplemental oxygen: Secondary | ICD-10-CM | POA: Diagnosis not present

## 2024-05-25 DIAGNOSIS — E1122 Type 2 diabetes mellitus with diabetic chronic kidney disease: Secondary | ICD-10-CM | POA: Insufficient documentation

## 2024-05-25 DIAGNOSIS — Z79899 Other long term (current) drug therapy: Secondary | ICD-10-CM | POA: Insufficient documentation

## 2024-05-25 DIAGNOSIS — N183 Chronic kidney disease, stage 3 unspecified: Secondary | ICD-10-CM | POA: Diagnosis not present

## 2024-05-25 DIAGNOSIS — G4733 Obstructive sleep apnea (adult) (pediatric): Secondary | ICD-10-CM | POA: Insufficient documentation

## 2024-05-25 DIAGNOSIS — E8770 Fluid overload, unspecified: Secondary | ICD-10-CM | POA: Insufficient documentation

## 2024-05-25 DIAGNOSIS — I5032 Chronic diastolic (congestive) heart failure: Secondary | ICD-10-CM | POA: Insufficient documentation

## 2024-05-25 DIAGNOSIS — I2510 Atherosclerotic heart disease of native coronary artery without angina pectoris: Secondary | ICD-10-CM | POA: Diagnosis not present

## 2024-05-25 DIAGNOSIS — J9611 Chronic respiratory failure with hypoxia: Secondary | ICD-10-CM | POA: Diagnosis not present

## 2024-05-25 DIAGNOSIS — R6250 Unspecified lack of expected normal physiological development in childhood: Secondary | ICD-10-CM | POA: Insufficient documentation

## 2024-05-25 DIAGNOSIS — I272 Pulmonary hypertension, unspecified: Secondary | ICD-10-CM | POA: Diagnosis present

## 2024-05-25 DIAGNOSIS — I130 Hypertensive heart and chronic kidney disease with heart failure and stage 1 through stage 4 chronic kidney disease, or unspecified chronic kidney disease: Secondary | ICD-10-CM | POA: Insufficient documentation

## 2024-05-25 LAB — BASIC METABOLIC PANEL WITH GFR
Anion gap: 11 (ref 5–15)
BUN: 25 mg/dL — ABNORMAL HIGH (ref 8–23)
CO2: 29 mmol/L (ref 22–32)
Calcium: 8.8 mg/dL — ABNORMAL LOW (ref 8.9–10.3)
Chloride: 100 mmol/L (ref 98–111)
Creatinine, Ser: 1.55 mg/dL — ABNORMAL HIGH (ref 0.61–1.24)
GFR, Estimated: 50 mL/min — ABNORMAL LOW
Glucose, Bld: 157 mg/dL — ABNORMAL HIGH (ref 70–99)
Potassium: 4.7 mmol/L (ref 3.5–5.1)
Sodium: 140 mmol/L (ref 135–145)

## 2024-05-25 LAB — PRO BRAIN NATRIURETIC PEPTIDE: Pro Brain Natriuretic Peptide: 4402 pg/mL — ABNORMAL HIGH

## 2024-05-25 MED ORDER — FUROSEMIDE 20 MG PO TABS: 20.0000 mg | ORAL_TABLET | Freq: Every day | ORAL | Status: AC

## 2024-05-25 NOTE — Patient Instructions (Addendum)
 CHANGE Lasix  to 40 mg bid  START Potassium 10 mEq daily.  Labs done today, your results will be available in MyChart, we will contact you for abnormal readings.  PLEASE wear your compression hose.   KEEP FOLLOW UP AS SCHEDULED.  If you have any questions or concerns before your next appointment please send us  a message through [place] or call our office at [phone].    TO LEAVE A MESSAGE FOR THE NURSE SELECT OPTION 2, PLEASE LEAVE A MESSAGE INCLUDING: YOUR NAME DATE OF BIRTH CALL BACK NUMBER REASON FOR CALL**this is important as we prioritize the call backs  YOU WILL RECEIVE A CALL BACK THE SAME DAY AS LONG AS YOU CALL BEFORE 4:00 PM  At the [facility], you and your health needs are our priority. As part of our continuing mission to provide you with exceptional heart care, we have created designated Provider Care Teams. These Care Teams include your primary Cardiologist (physician) and Advanced Practice Providers (APPs- Physician Assistants and Nurse Practitioners) who all work together to provide you with the care you need, when you need it.   You may see any of the following providers on your designated Care Team at your next follow up: Dr Toribio Fuel Dr Ezra Shuck Dr. Morene Brownie Greig Mosses, NP Caffie Shed, [place] [facility] [place], [place] Beckey Coe, NP Jordan Lee, NP Ellouise Class, NP Tinnie Redman, PharmD Jaun Bash, PharmD   Please be sure to bring in all your medications bottles to every appointment.    Thank you for choosing [facility]

## 2024-06-14 ENCOUNTER — Ambulatory Visit (HOSPITAL_COMMUNITY): Payer: Medicare (Managed Care) | Admitting: Cardiology

## 2024-06-18 ENCOUNTER — Ambulatory Visit: Payer: Medicare (Managed Care) | Admitting: Pulmonary Disease
